# Patient Record
Sex: Male | Born: 1950 | Race: White | Hispanic: No | Smoking: Current every day smoker
Health system: Southern US, Community
[De-identification: ages and names within clinical notes are randomized; demographics above are authoritative.]

## PROBLEM LIST (undated history)

## (undated) DIAGNOSIS — F419 Anxiety disorder, unspecified: Secondary | ICD-10-CM

## (undated) DIAGNOSIS — I219 Acute myocardial infarction, unspecified: Secondary | ICD-10-CM

## (undated) DIAGNOSIS — I25119 Atherosclerotic heart disease of native coronary artery with unspecified angina pectoris: Secondary | ICD-10-CM

## (undated) DIAGNOSIS — R911 Solitary pulmonary nodule: Secondary | ICD-10-CM

## (undated) DIAGNOSIS — M1990 Unspecified osteoarthritis, unspecified site: Secondary | ICD-10-CM

## (undated) DIAGNOSIS — J449 Chronic obstructive pulmonary disease, unspecified: Secondary | ICD-10-CM

## (undated) DIAGNOSIS — I714 Abdominal aortic aneurysm, without rupture, unspecified: Secondary | ICD-10-CM

## (undated) DIAGNOSIS — Z9889 Other specified postprocedural states: Secondary | ICD-10-CM

## (undated) HISTORY — DX: Other specified postprocedural states: Z98.890

## (undated) HISTORY — PX: INGUINAL HERNIA REPAIR: SUR1180

## (undated) HISTORY — DX: Abdominal aortic aneurysm, without rupture, unspecified: I71.40

## (undated) HISTORY — DX: Abdominal aortic aneurysm, without rupture: I71.4

---

## 2005-07-23 ENCOUNTER — Ambulatory Visit (HOSPITAL_COMMUNITY): Admission: RE | Admit: 2005-07-23 | Discharge: 2005-07-23 | Payer: Self-pay | Admitting: Family Medicine

## 2005-09-16 ENCOUNTER — Ambulatory Visit (HOSPITAL_COMMUNITY): Admission: RE | Admit: 2005-09-16 | Discharge: 2005-09-16 | Payer: Self-pay | Admitting: General Surgery

## 2007-05-19 ENCOUNTER — Ambulatory Visit (HOSPITAL_COMMUNITY): Admission: RE | Admit: 2007-05-19 | Discharge: 2007-05-19 | Payer: Self-pay | Admitting: Family Medicine

## 2007-07-21 ENCOUNTER — Ambulatory Visit (HOSPITAL_COMMUNITY): Admission: RE | Admit: 2007-07-21 | Discharge: 2007-07-21 | Payer: Self-pay | Admitting: Family Medicine

## 2007-11-23 ENCOUNTER — Ambulatory Visit (HOSPITAL_COMMUNITY): Admission: RE | Admit: 2007-11-23 | Discharge: 2007-11-23 | Payer: Self-pay | Admitting: Family Medicine

## 2007-11-23 IMAGING — CT CT CHEST W/ CM
1 series · 15 of 31 positions shown, 19 images · IV contrast (Omnipaque 300)
Comparison: none

HISTORY: Followup lung nodule

[Series 2: chestroutine 5.0 b40f · axial · 0.72mm/px · z∈[-373,-83]mm · 15 of 64 slices shown, 19 images]
[im 3/64  mediastinal]
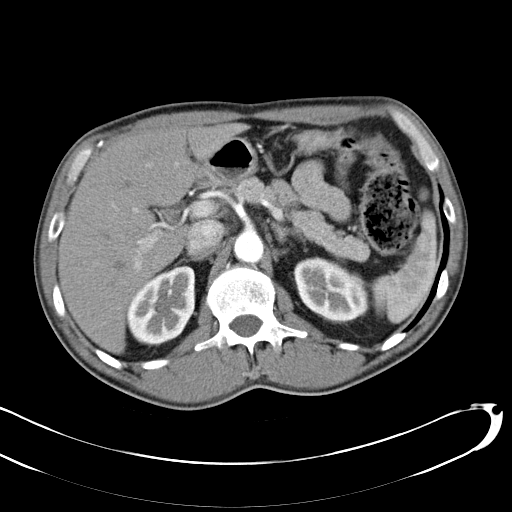
[im 3/64  lung]
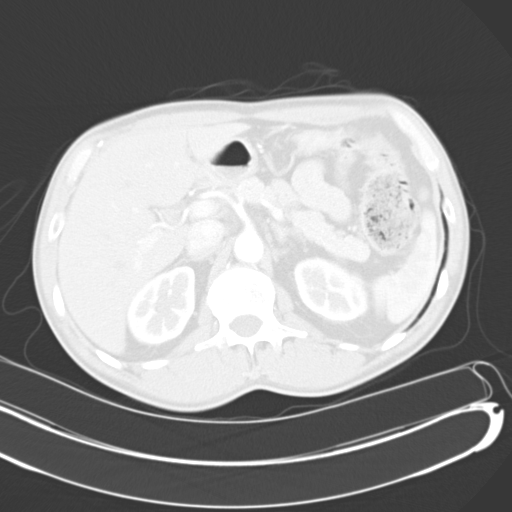
[im 8/64  lung]
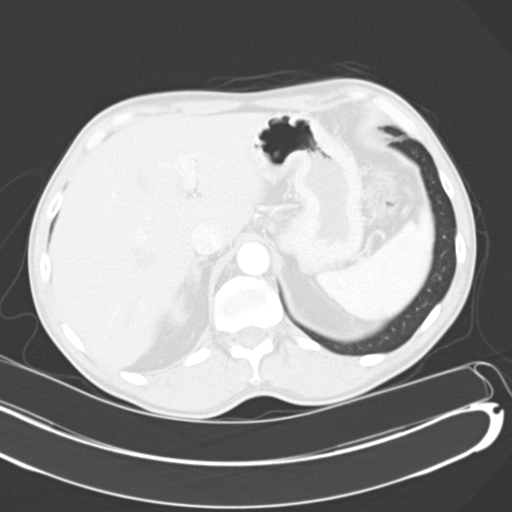
[im 12/64  lung]
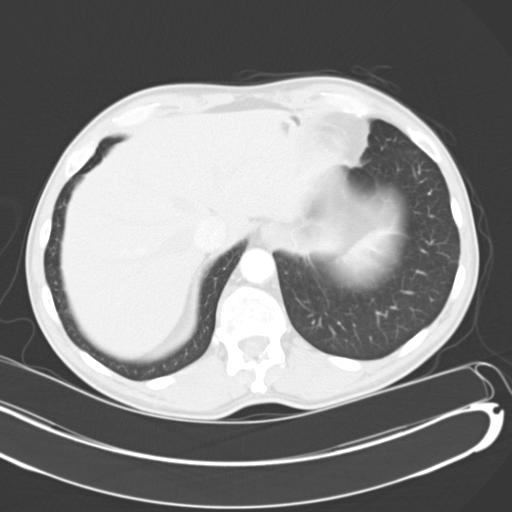
[im 15/64  lung]
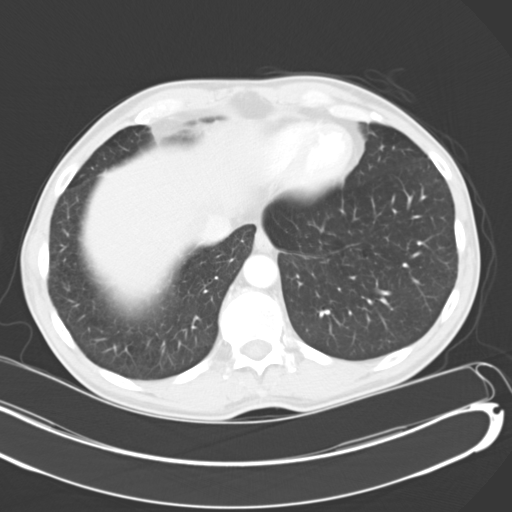
[im 19/64  mediastinal]
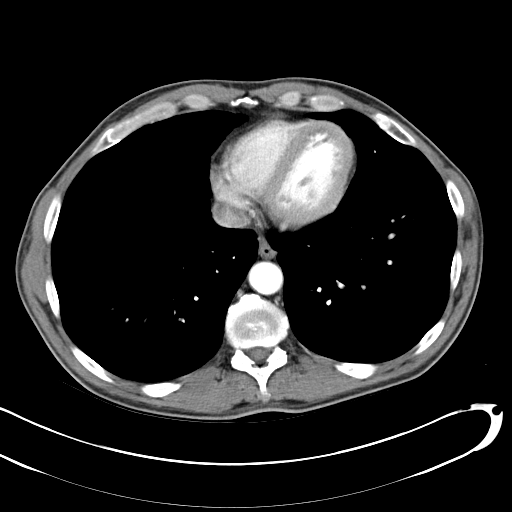
[im 19/64  lung]
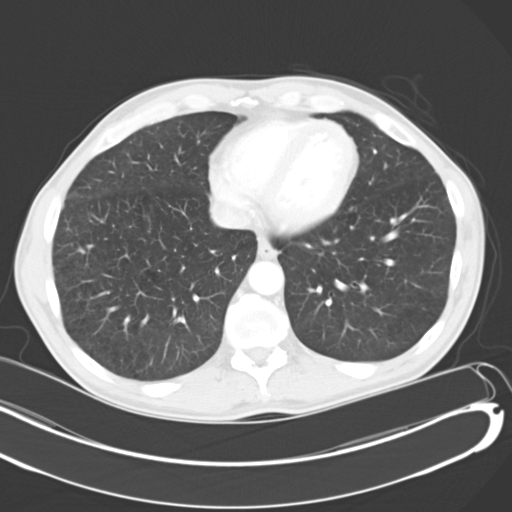
[im 24/64  lung]
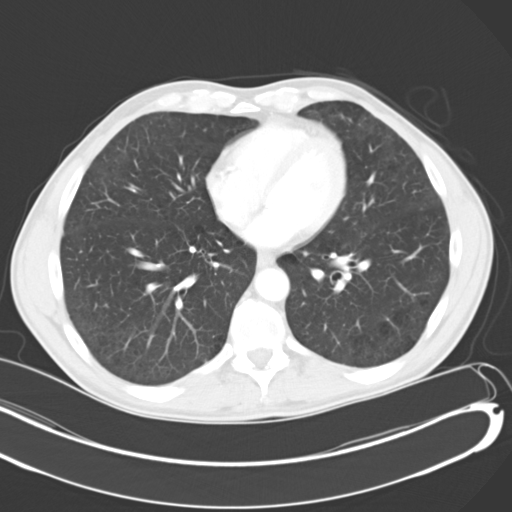
[im 29/64  lung]
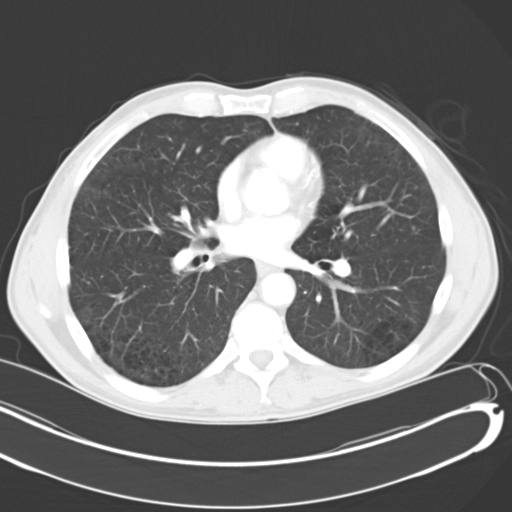
[im 33/64  lung]
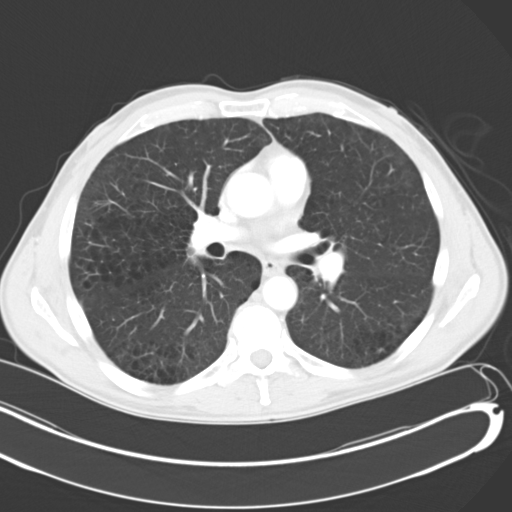
[im 36/64  mediastinal]
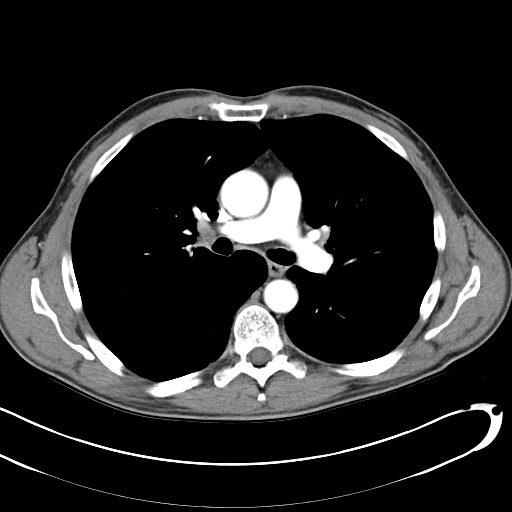
[im 36/64  lung]
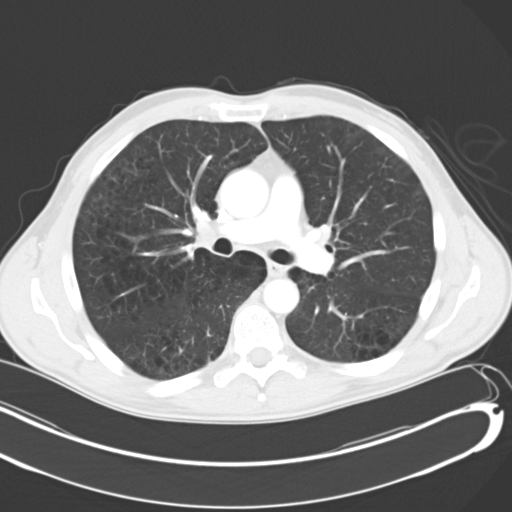
[im 40/64  lung]
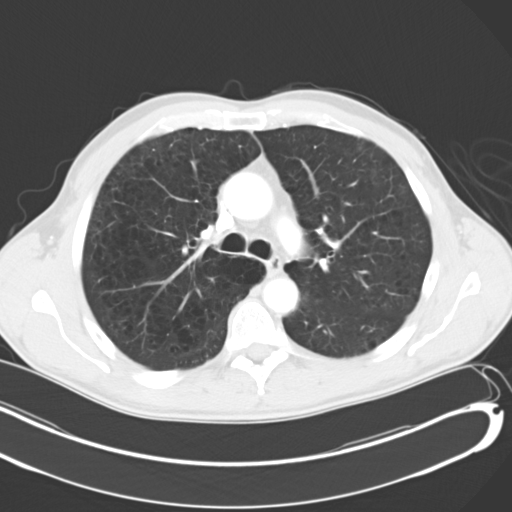
[im 45/64  lung]
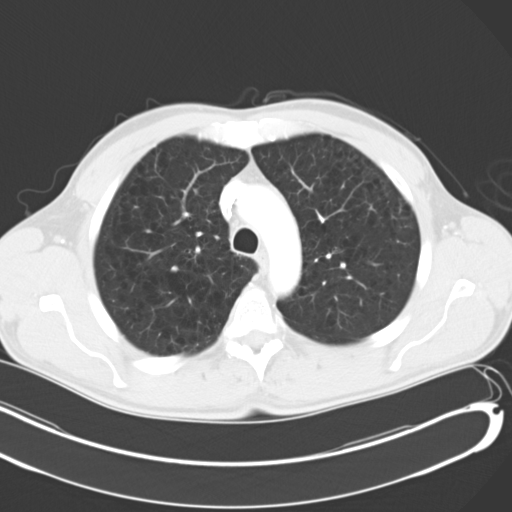
[im 50/64  lung]
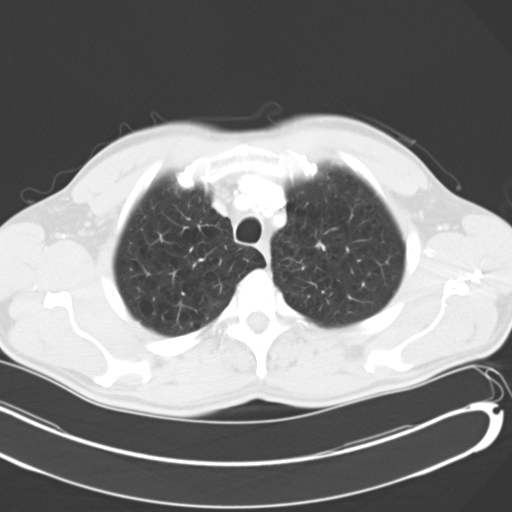
[im 52/64  mediastinal]
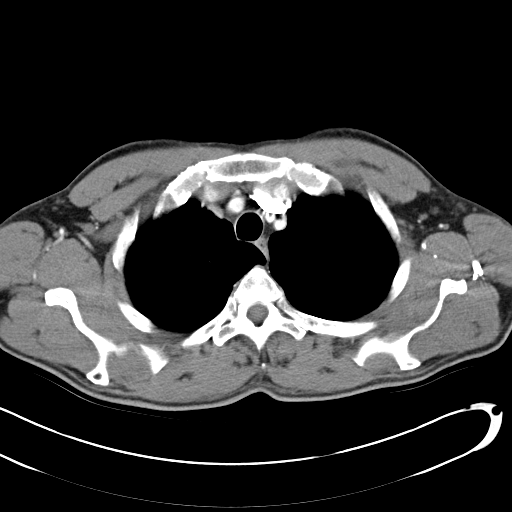
[im 52/64  lung]
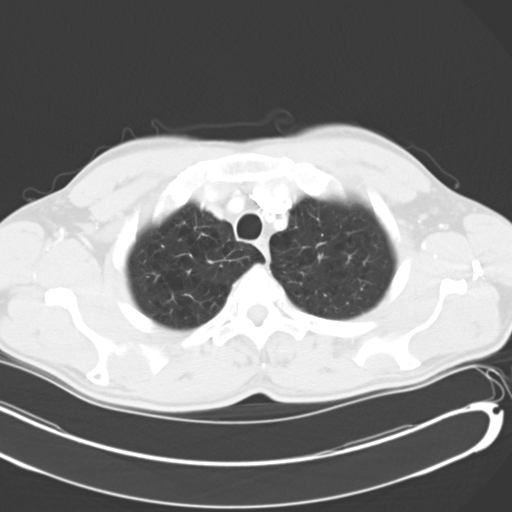
[im 57/64  lung]
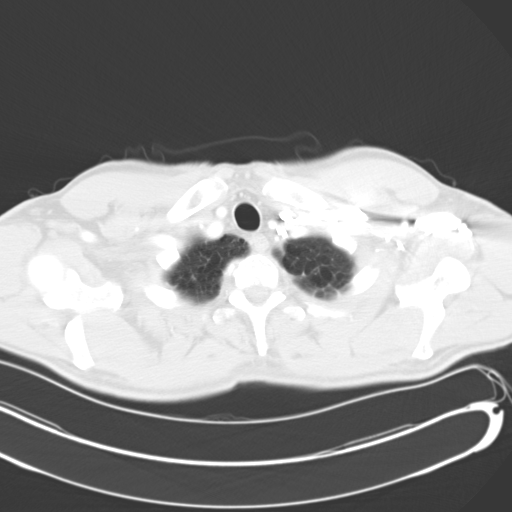
[im 61/64  lung]
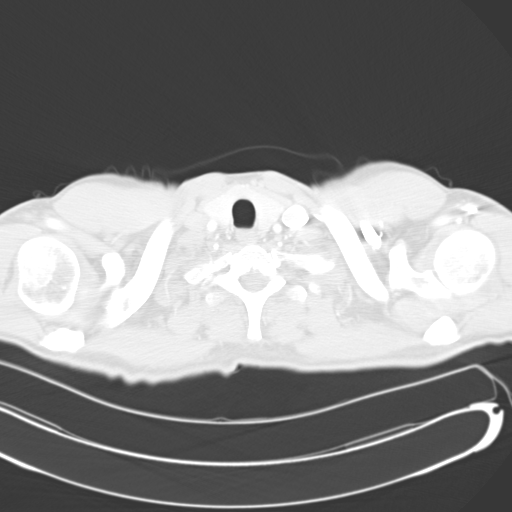

[15 of 31 positions shown; findings below may reference images not displayed]

CT CHEST WITH CONTRAST:

Multidetector helical CT chest performed following 80 cc [6R].
Sagittal and coronal images reconstructed from axial data set.
The prior outside study was not provided for comparison.

Thoracic vascular structures patent.
No thoracic adenopathy.
Visualized portion of upper abdomen normal.
Emphysematous changes with densely calcified granulomata in right middle lobe.
Nodular area of thickening identified at superior aspect the right major
fissure, 12 x 6 mm image 27.
Tiny scar in right middle lobe image 33.
Two tiny areas of questionable nodularity or scarring noted left lung images 32
and 35.
No pulmonary infiltrate, pleural effusion, or dominant pulmonary mass.
No acute bone abnormalities.
IMPRESSION: Calcified granulomata right middle lobe.
Additional nonspecific nodular foci superior aspect of right major fissure and
in bilateral lungs.
If patient's previous outside chest CT can be obtained, direct comparison can be
main 2 assess stability.
In the absence of direct comparison to previous exam to confirm stability,
recommend followup CT chest in 6 months to reassess.

## 2008-09-26 ENCOUNTER — Ambulatory Visit (HOSPITAL_COMMUNITY): Admission: RE | Admit: 2008-09-26 | Discharge: 2008-09-26 | Payer: Self-pay | Admitting: General Surgery

## 2008-11-11 ENCOUNTER — Ambulatory Visit (HOSPITAL_COMMUNITY): Admission: RE | Admit: 2008-11-11 | Discharge: 2008-11-11 | Payer: Self-pay | Admitting: Family Medicine

## 2008-11-11 IMAGING — CT CT CHEST W/O CM
1 series · 16 of 33 positions shown, 20 images · non-contrast
Comparison: [DATE]

CLINICAL DATA: Follow-up nodules

CT CHEST WITHOUT CONTRAST
TECHNIQUE: Multidetector CT imaging of the chest was performed
following the standard protocol without IV contrast.

[Series 2: chestroutine 5.0 b40f · axial · 0.68mm/px · z∈[-350,-65]mm · 16 of 63 slices shown, 20 images]
[im 3/63  mediastinal]
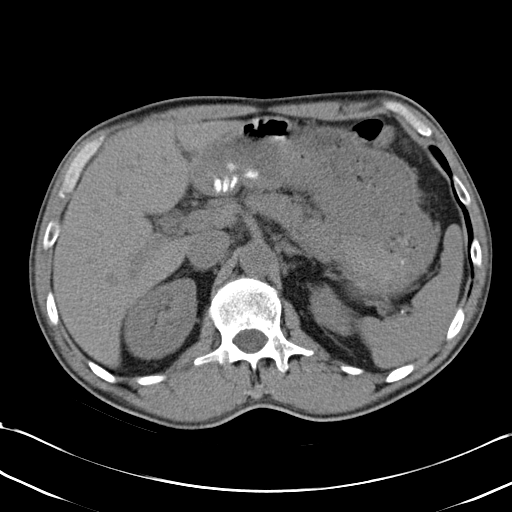
[im 3/63  lung]
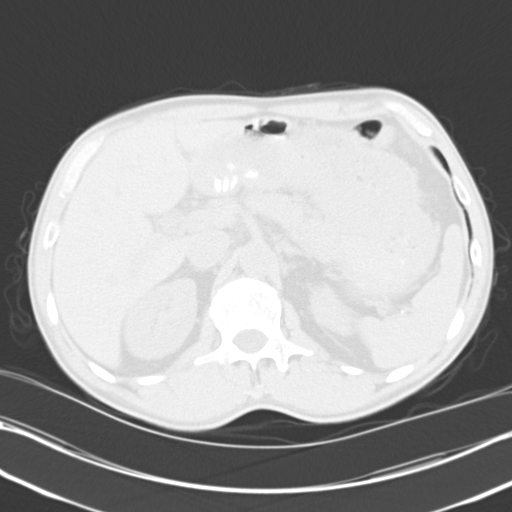
[im 7/63  lung]
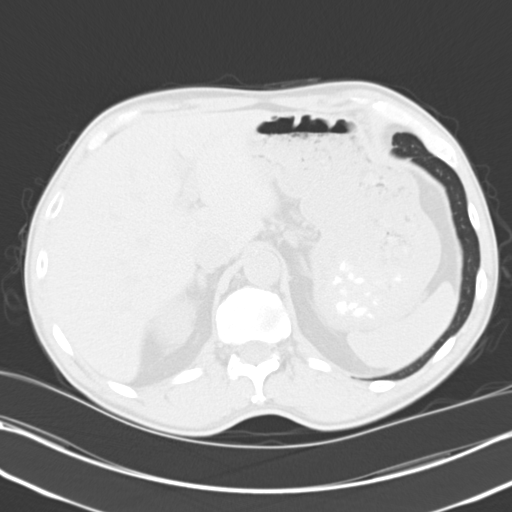
[im 12/63  lung]
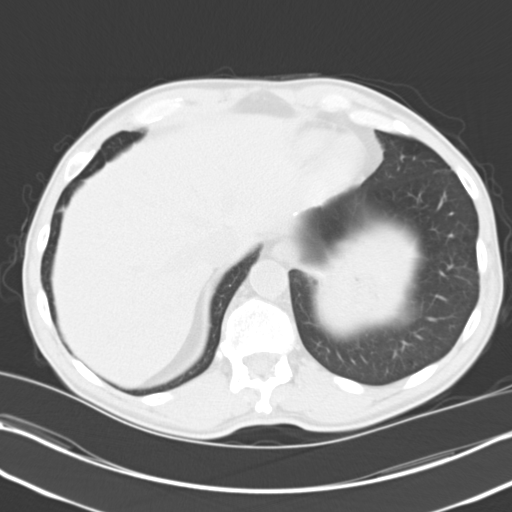
[im 14/63  lung]
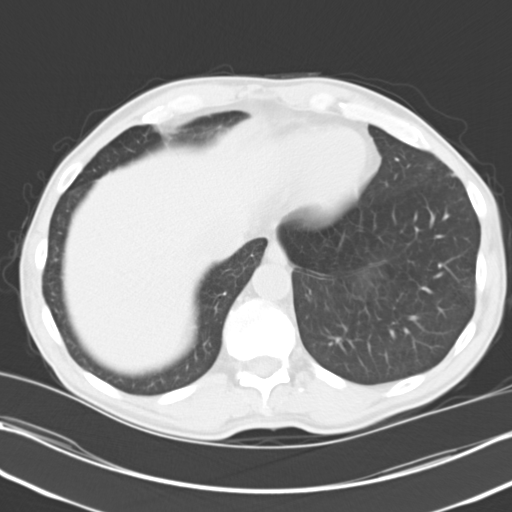
[im 19/63  mediastinal]
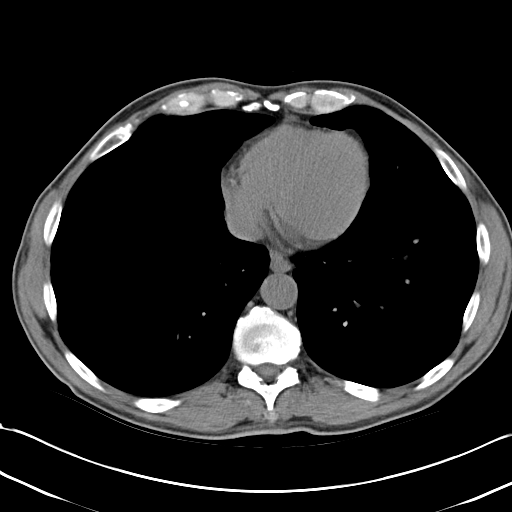
[im 19/63  lung]
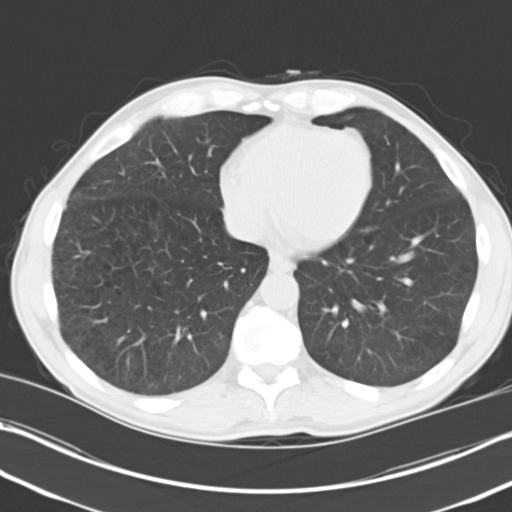
[im 23/63  lung]
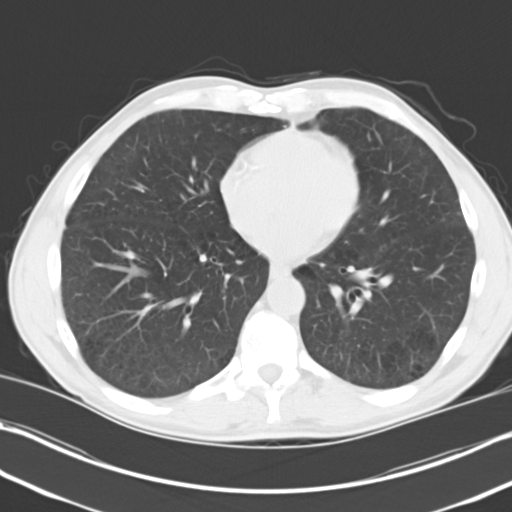
[im 26/63  lung]
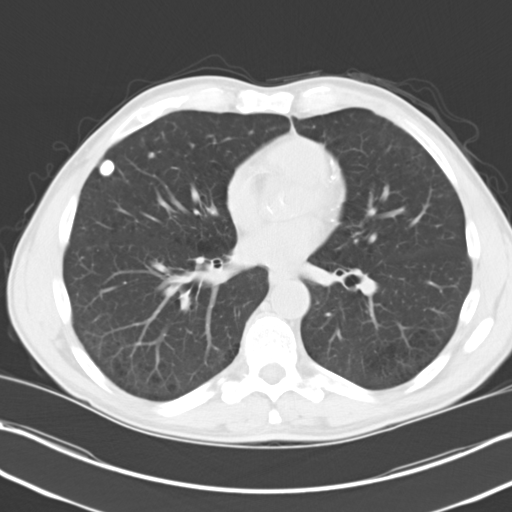
[im 30/63  lung]
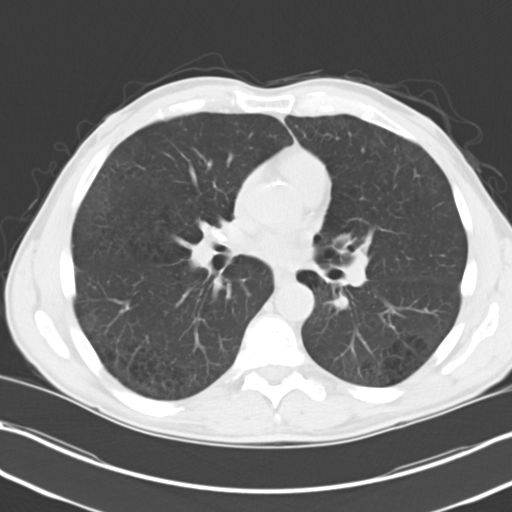
[im 34/63  mediastinal]
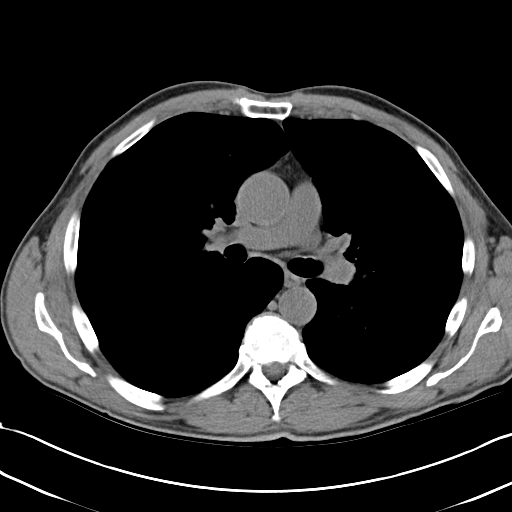
[im 34/63  lung]
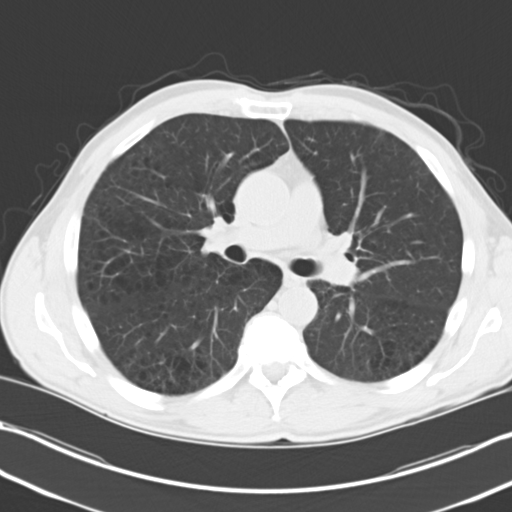
[im 37/63  lung]
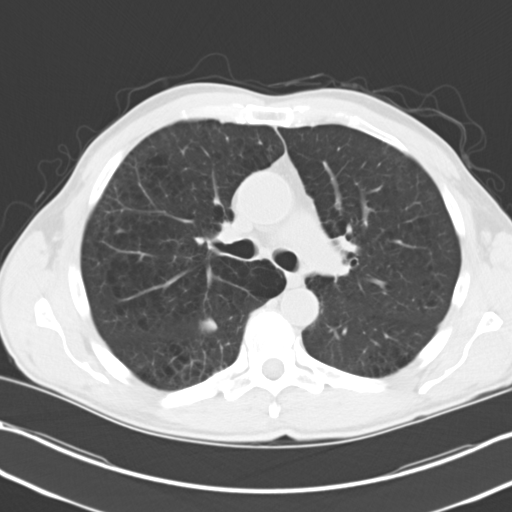
[im 40/63  lung]
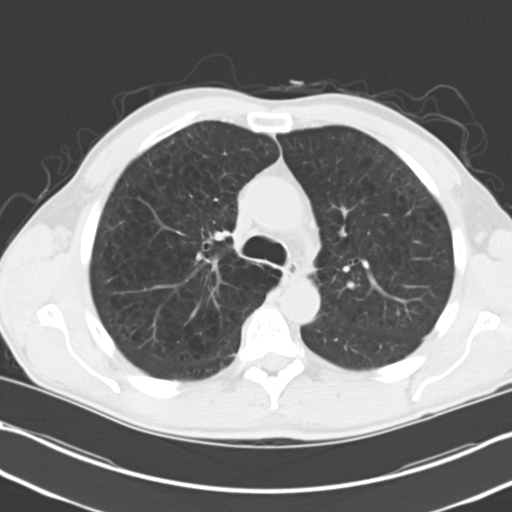
[im 44/63  lung]
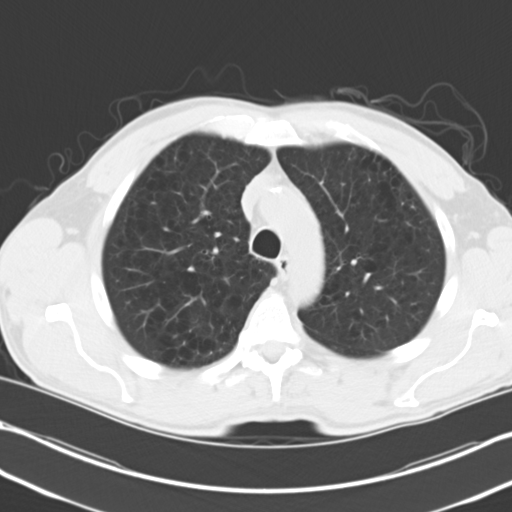
[im 49/63  mediastinal]
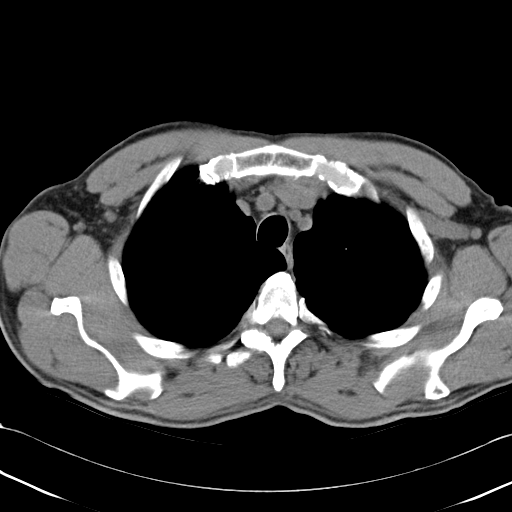
[im 49/63  lung]
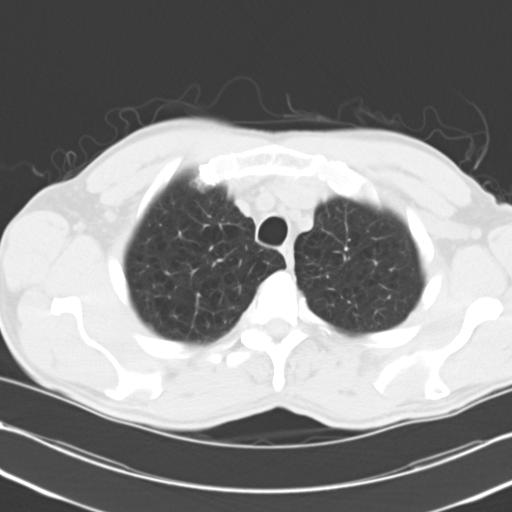
[im 51/63  lung]
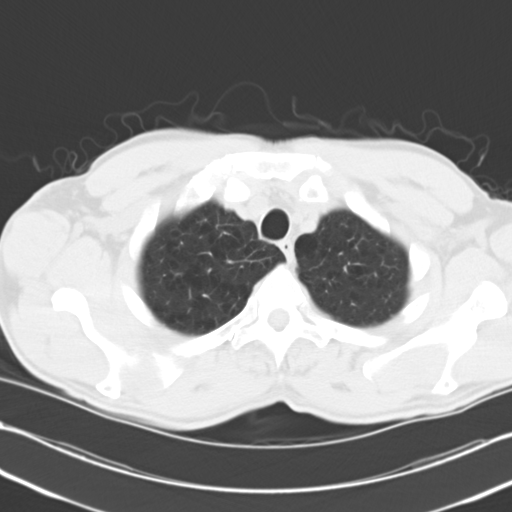
[im 56/63  lung]
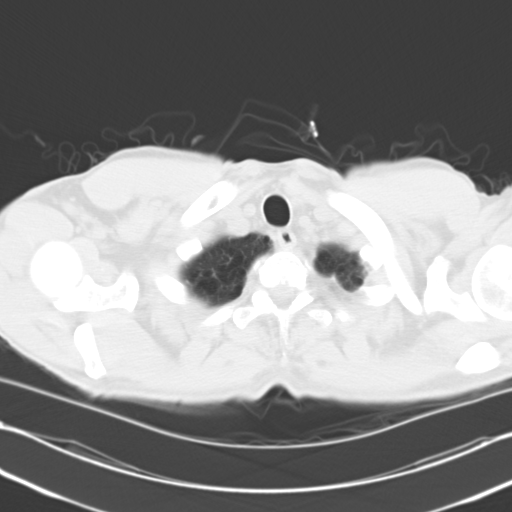
[im 60/63  lung]
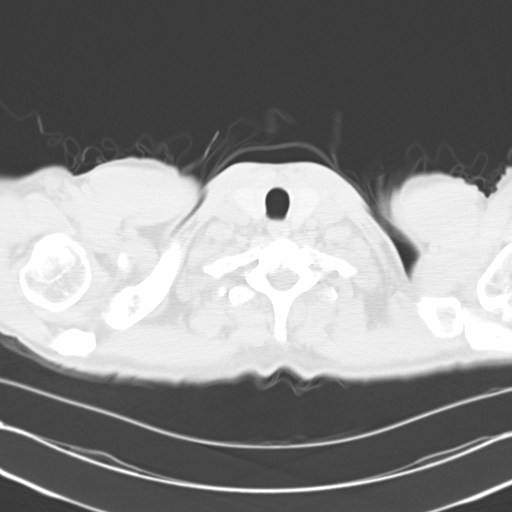

[16 of 33 positions shown; findings below may reference images not displayed]

FINDINGS: Negative abnormal mediastinal adenopathy.  Negative
pericardial effusion.  Coronary artery calcifications.

No pneumothorax or pleural effusion.

Pleural thickening in the right major fissure on image 27 is
stable.  Scarring in the superior segment of the left lower lobe on
image 32 is stable.  Scarring in the left upper lobe on images 36
and 37 is stable.  Right upper lobe scarring on image 32 is stable
calcified granuloma and adjacent nodule in the right upper lobe are
stable on image 38.  No new pulmonary nodules.

Severe emphysema is stable.  Thoracic spine is stable.  Upper
abdomen is stable.
IMPRESSION: Stable pulmonary parenchymal densities.  Stability over 2 years
supports benign etiology.

## 2010-11-02 ENCOUNTER — Ambulatory Visit (HOSPITAL_COMMUNITY)
Admission: RE | Admit: 2010-11-02 | Discharge: 2010-11-02 | Payer: Self-pay | Source: Home / Self Care | Attending: Family Medicine | Admitting: Family Medicine

## 2010-11-02 IMAGING — CR DG CHEST 2V
2 series · 2 of 2 positions shown · non-contrast
Comparison: CT chest of [DATE].

CLINICAL DATA: COPD.

CHEST - 2 VIEW

[view not recorded (1 of 2)]
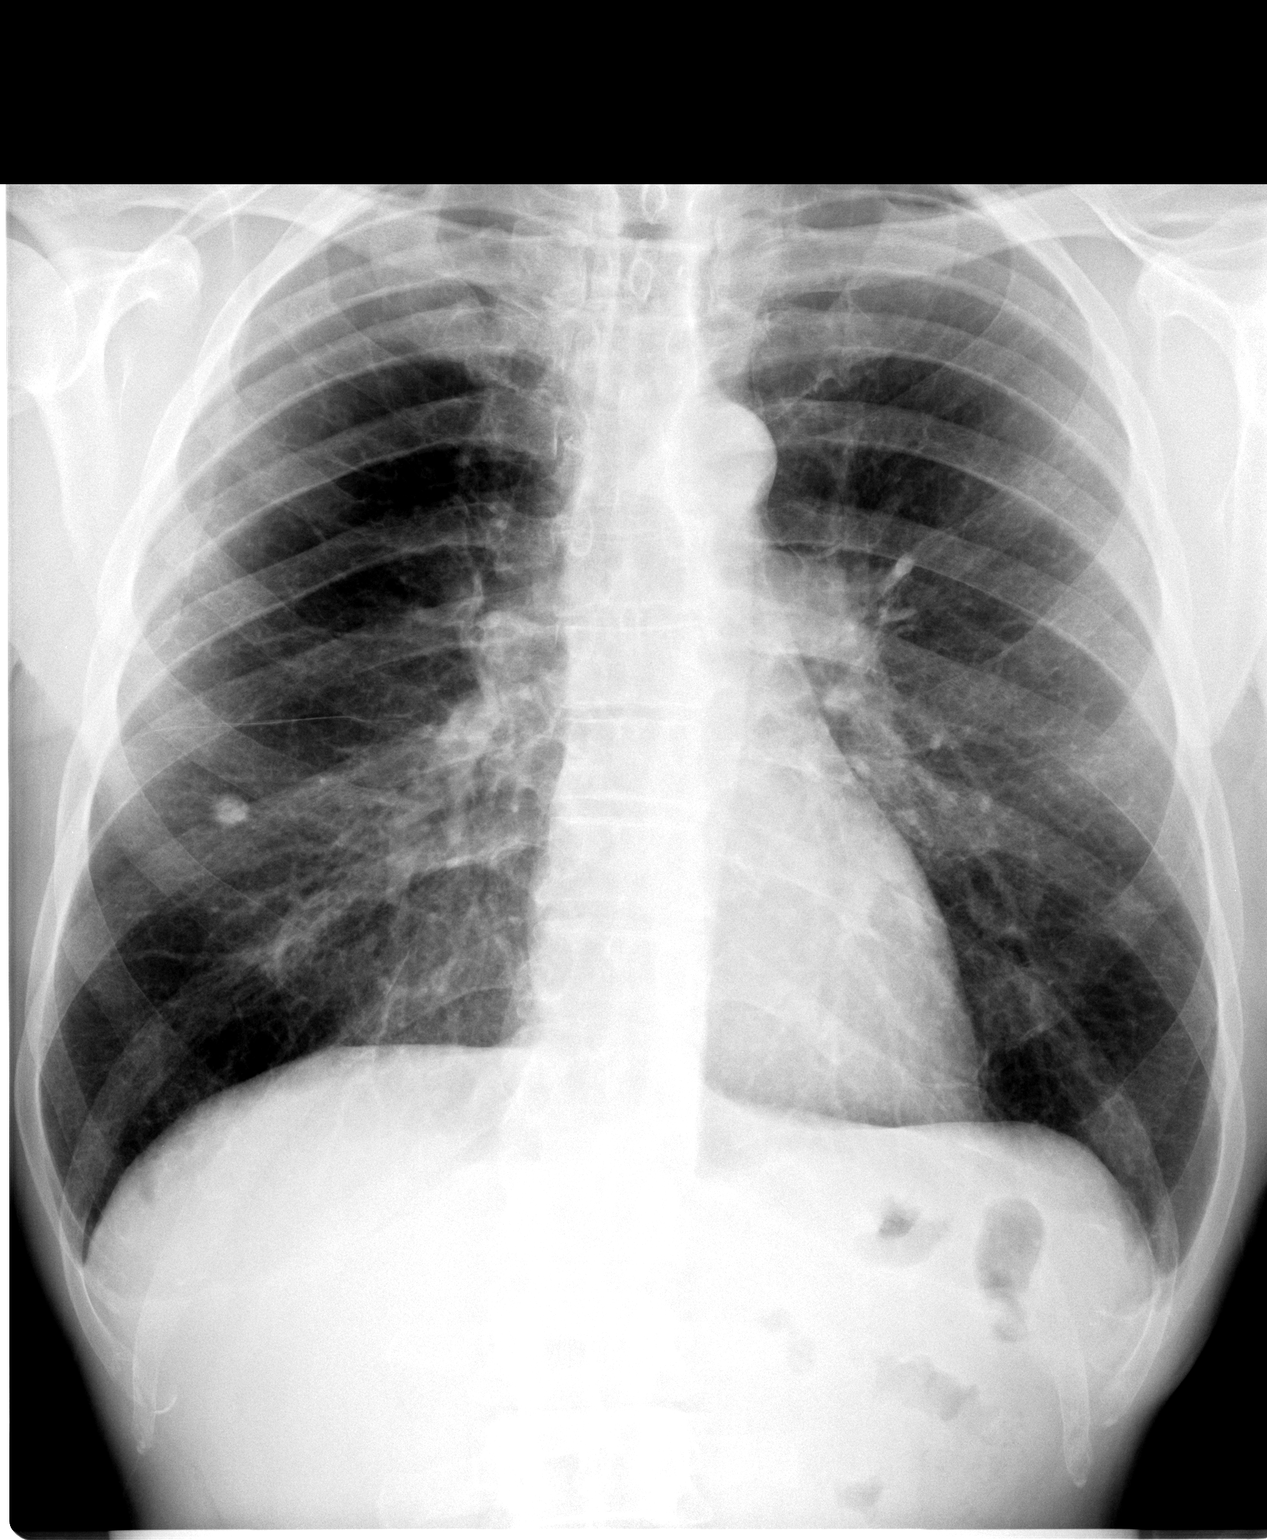

[view not recorded (2 of 2)]
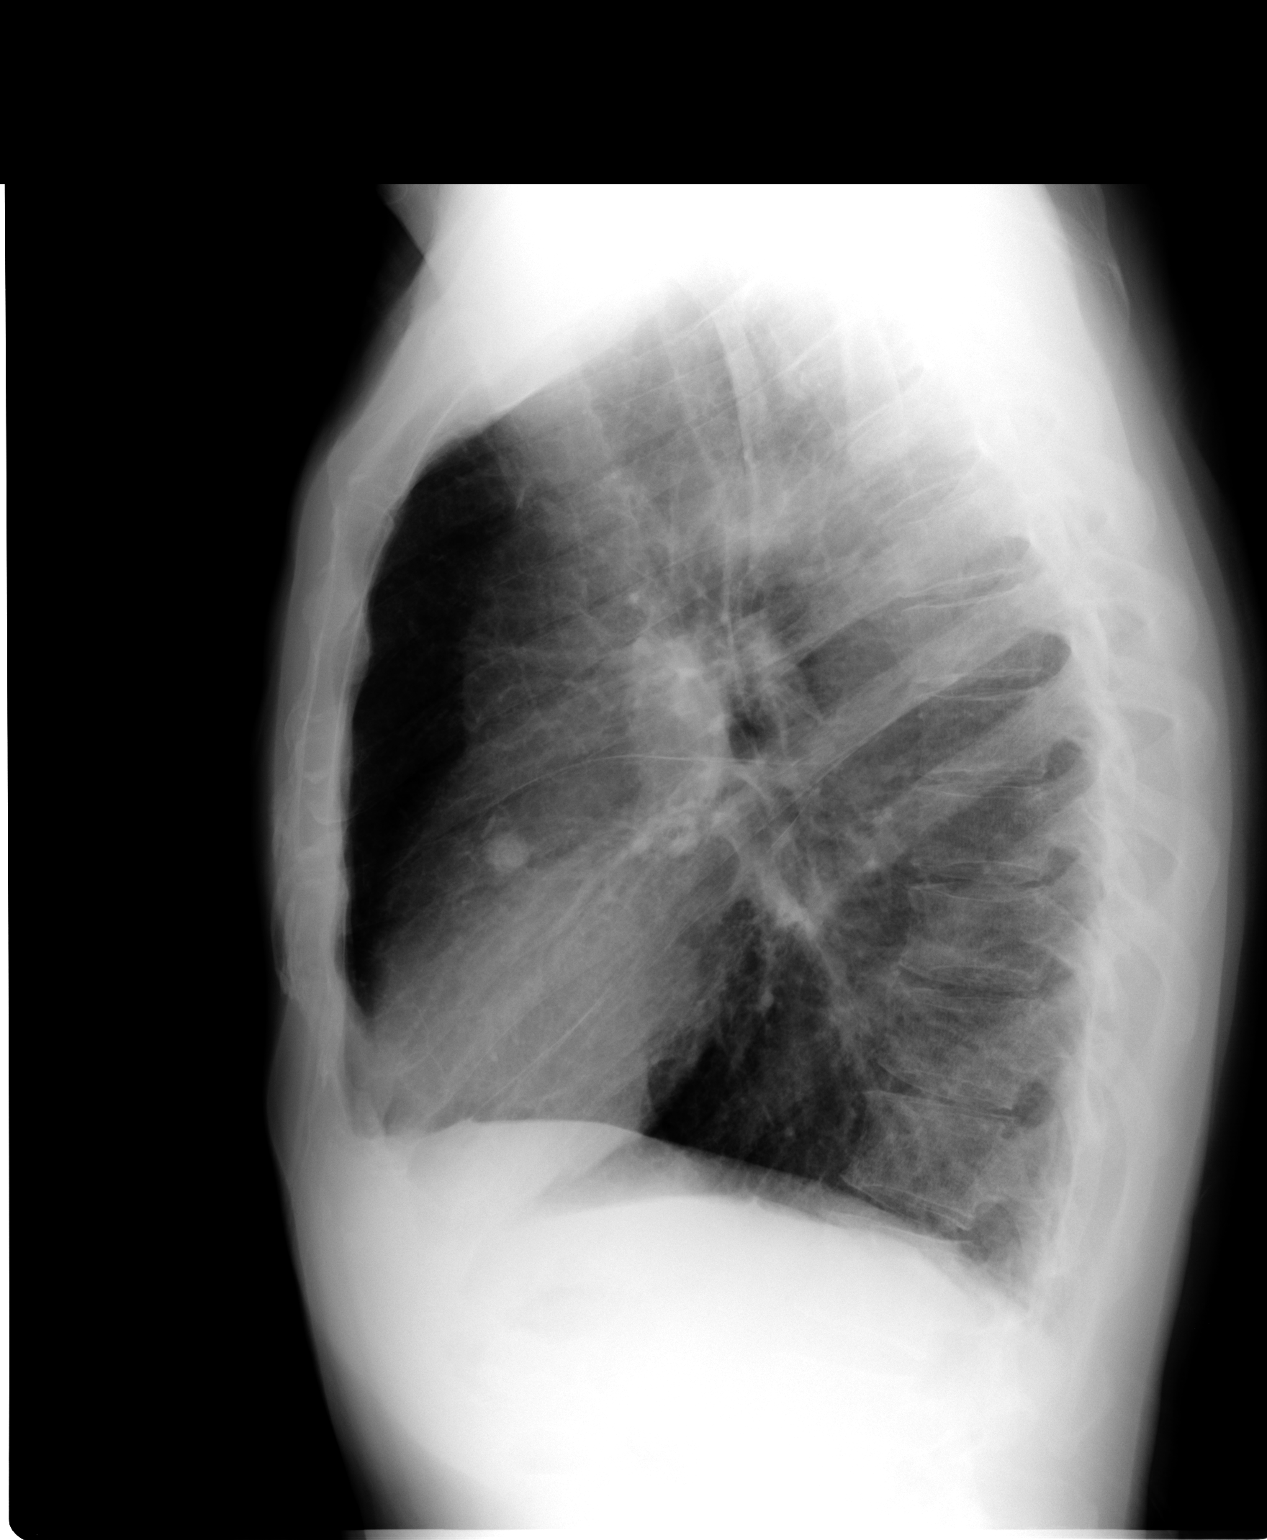

[2 of 2 positions shown; findings below may reference images not displayed]

FINDINGS: Lungs are emphysematous.  Calcified granuloma in the
right middle lobe is unchanged.  No new airspace disease or
effusion.  No pneumothorax.  Heart size normal.
IMPRESSION: COPD without acute disease.

## 2010-11-11 ENCOUNTER — Encounter: Payer: Self-pay | Admitting: Family Medicine

## 2010-12-31 ENCOUNTER — Encounter (HOSPITAL_COMMUNITY): Payer: Self-pay

## 2011-01-08 ENCOUNTER — Ambulatory Visit (HOSPITAL_COMMUNITY)
Admission: RE | Admit: 2011-01-08 | Discharge: 2011-01-08 | Disposition: A | Discharge status: Home / Self Care | Payer: Managed Care, Other (non HMO) | Source: Ambulatory Visit | Attending: Pulmonary Disease | Admitting: Pulmonary Disease

## 2011-01-08 DIAGNOSIS — R0602 Shortness of breath: Secondary | ICD-10-CM | POA: Insufficient documentation

## 2011-01-08 LAB — BLOOD GAS, ARTERIAL
Bicarbonate: 23.4 mEq/L (ref 20.0–24.0)
O2 Saturation: 97.7 %
Patient temperature: 37

## 2011-03-05 NOTE — H&P (Signed)
NAME:  PIERCEN, COVINO               ACCOUNT NO.:  [id number]   MEDICAL RECORD NO.:  [id number]          PATIENT TYPE:  AMB   LOCATION:  DAY                           FACILITY:  [facility]   PHYSICIAN:  Tilford Pillar, MD      DATE OF BIRTH:  03-Aug-1951   DATE OF ADMISSION:  DATE OF DISCHARGE:  LH                              HISTORY & PHYSICAL   CHIEF COMPLAINT:  Hernia.   HISTORY OF PRESENT ILLNESS:  The patient is a 60 year old male who  presented to my office on referral after about one plus year history of  notable bulge in the right groin.  This slowly increased in size.  He  has had intermittent association with burning, discomfort, and pain in  this area.  No nausea, vomiting, no signs or symptoms consistent with  incarceration or strangulation.  He has had no change with bowel  movement, no hematochezia.  No melena.  No change with urination.  No  erectile dysfunction.   PAST MEDICAL HISTORY:  1. Anxiety.  2. Insomnia.  3. Osteoarthritis.   PAST SURGICAL HISTORY:  Previous left inguinal hernia repair with mesh  approximately 15 years ago.   MEDICATIONS:  1. Mobic  2. Xanax.   ALLERGIES:  No known drug allergies.   SOCIAL HISTORY:  One-pack per day smoker.  No alcohol use.  Occupation  is a Naval architect.  There is significantly involved lifting associated  with this.   REVIEW OF SYSTEMS:  CONSTITUTIONAL:  Unremarkable.  EYES:  Blurred  vision.  EARS, NOSE, and THROAT:  Unremarkable.  RESPIRATORY:  Unremarkable.  CARDIOVASCULAR:  Unremarkable.  GASTROINTESTINAL:  Unremarkable.  GENITOURINARY:  Unremarkable.  MUSCULOSKELETAL:  Unremarkable.  SKIN:  Unremarkable.  ENDOCRINE:  Unremarkable.  NEURO:  Unremarkable.   PHYSICAL EXAMINATION:  GENERAL:  The patient is a healthy appearing  male, well developed.  He is calm in no acute distress.  He is alert and  oriented x3.  HEENT:  Scalp no deformities, no masses.  Eyes, pupils equal, round, and  reactive.  Extraocular  movements are intact.  No conjunctival pallor is  noted.  Oral mucosa is pink.  Normal occlusion.  NECK:  Trachea is midline.  No cervical lymphadenopathy.  PULMONARY:  Unlabored respiration.  No wheezes.  No crackles.  He has  good auscultation bilaterally.  CARDIOVASCULAR:  Regular rate and rhythm.  No murmurs, no gallops.  He  has 2+ radial pulses bilaterally.  ABDOMINAL:  Positive bowel sounds.  Abdomen is soft, nontender.  He does  have a right inguinal hernia that is reducible.  No masses are apparent.  No left inguinal hernias noted.  GENITALIA:  Normal male external genitalia.  He has bilateral descended  testicles.  SKIN:  Warm and dry.   ASSESSMENT AND PLAN:  Right inguinal hernia.  At this time, I did  discuss the risks, benefits, and alternatives of a right inguinal  herniorrhaphy with mesh.  The risk including, but not limited to risk of  bleeding, infection, mesh infection requiring the removal of the mesh  and subsequent inguinal hernia repair  upon resolution of the infection,  as well as possibility of ischemic orchitis, paresthesia, testicular  loss, as well as the possibility of intraoperative cardiac and pulmonary  events.  The patient's questions and concerns are addressed.  I did  discuss the patient's typical recovery with the limitation of no heavy  lifting over 20 pounds for 4-6 weeks.  He understands this and does wish  to proceed.  Signs and symptoms of incarceration and strangulation were  also discussed with the patient and the patient will present to the  emergency room should any of these symptoms develop.      Tilford Pillar, MD  Electronically Signed     BZ/MEDQ  D:  09/20/2008  T:  09/21/2008  Job:  [id number]   cc:   Patrica Duel, M.D.  Fax: [phone]   [facility]

## 2011-03-05 NOTE — Op Note (Signed)
NAME:  [name], [name]               ACCOUNT NO.:  [id number]   MEDICAL RECORD NO.:  [id number]          PATIENT TYPE:  AMB   LOCATION:  DAY                           FACILITY:  [facility]   PHYSICIAN:  Tilford Pillar, MD      DATE OF BIRTH:  [birth year]   DATE OF PROCEDURE:  09/26/2008  DATE OF DISCHARGE:                               OPERATIVE REPORT   PREOPERATIVE DIAGNOSIS:  Right inguinal hernia.   POSTOPERATIVE DIAGNOSIS:  Right inguinal hernia.   PROCEDURE:  Right inguinal herniorrhaphy with mesh.   SURGEON:  Tilford Pillar, MD   ANESTHESIA:  General laryngeal mask airway, local anesthetic 0.5%  Sensorcaine plain.   SPECIMENS:  None.   ESTIMATED BLOOD LOSS:  Minimal.   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  This  has slowly increased in size.  He does have occasional tenderness in  this area.  On evaluation, he did have a right inguinal hernia.  Risks,  benefits, and alternatives of a herniorrhaphy were discussed at length  with the patient including, but not limited to risk of bleeding,  infection, infection of the mesh requiring removal and subsequent repair  after interval treatments of the infection, ischemic orchitis,  testicular loss as well as possibility of intraoperative cardiac and  pulmonary events.  The patient's questions and concerns were addressed  and the patient was consented for planned procedure.   OPERATION:  The patient was taken to the operating room and was placed  in the supine position on the operating room table, at which time  general anesthetic was administered.  Once the patient was asleep, he  had a laryngeal mask airway placed by Anesthesia.  At this point, his  right groin was prepped and draped in the standard fashion using  DuraPrep and sterile drapes.  At this point, a skin incision was made in  the right groin with a scalpel.  Additional  dissection down through  subcuticular tissue including division of Scarpa fascia was carried out  using electrocautery.  This was carried out down to the external oblique  fascia, which was scored with a scalpel and opened medially with a  Metzenbaum scissors to the external inguinal ring.  At this point, upon  entering into the inguinal canal, the cord structures were isolated and  dissected free from the surrounding structures.  A window was created  around the cord structures of the pubic tubercle.  A Penrose drain was  utilized to help with the retraction.  Immobilization of the cord  structures with the cord structures isolated.  A large direct hernia was  identified.  A Ray-Tec sponge was temporarily placed into this to help  reduce it back into the abdominal cavity.  On inspection of the cord  structures, there was no evidence of any indirect component, therefore  attention was turned to repair of the direct hernia.   The douche was removed.  A large Prolene mesh plug was then inserted  into the defect,  which was reduced because a series of 2-0 Novofil  sutures were utilized to tack the mesh to the surrounding edges of the  defect.  This did help continue to reduce the hernia sac.  At this time,  the mesh onlay was then placed tacking it medially over the pubic  tubercle superiorly to the conjoined tendon and inferiorly to the  shelving portion of the inguinal ligament.  Laterally, the keyhole  defect was secured around the cord structures and the tails were tucked  underneath the external oblique fascia.  Tacking sutures utilized 2-0  Novofil sutures throughout.  With the mesh in place, I was quite pleased  with the appearance of the repair.  At this time, the wound was  irrigated.  A 2-0 Vicryl suture was utilized to reapproximate the  external oblique fascia in a running continuous fashion.  The local  anesthetic was instilled.  A 3-0 Vicryl suture was utilized to   reapproximate the Scarpa fascia and then a 4-0 Monocryl was utilized in  a running subcuticular suture to reapproximate the skin edges in a  running subcuticular suture.  Skin was washed and dried with moist dry  towel.  Benzoin was applied around the incision.  Half-inch Steri-Strips  were placed.  The patient was allowed to come out of the general  anesthetic and was transferred back to regular hospital bed.  He was  transferred to Post-Anesthetic Care Unit in stable condition.  At the  conclusion of procedure, all instrument, sponge, and needle counts were  correct.  The patient tolerated the procedure well.      Tilford Pillar, MD  Electronically Signed     BZ/MEDQ  D:  09/26/2008  T:  09/27/2008  Job:  [id number]   cc:   Patrica Duel, M.D.  Fax: [phone]

## 2011-03-08 NOTE — Procedures (Signed)
NAME:  KARLON, SCHLAFER               ACCOUNT NO.:  [id number]   MEDICAL RECORD NO.:  [id number]          PATIENT TYPE:  OUT   LOCATION:  RESP                          FACILITY:  [facility]   PHYSICIAN:  Edward L. Juanetta Gosling, M.D.DATE OF BIRTH:  07-Sep-1951   DATE OF PROCEDURE:  DATE OF DISCHARGE:  07/21/2007                            PULMONARY FUNCTION TEST   IMPRESSION:  1. Spirometry shows very high forced vital capacity and FEV-1, but is      showing some airflow obstruction as well.  2. Lung volumes show some hyperexpansion which is consistent with the      spirometry.  3. Diffusion capacity of carbon monoxide is mildly reduced.  4. Arterial blood gases are normal.  5. There is no significant bronchodilator effect.      Edward L. Juanetta Gosling, M.D.  Electronically Signed     ELH/MEDQ  D:  07/22/2007  T:  07/23/2007  Job:  [id number]   cc:   Kirk Ruths, M.D.  Fax: [phone]

## 2011-03-08 NOTE — H&P (Signed)
NAME:  [name], [name]               ACCOUNT NO.:  [id number]   MEDICAL RECORD NO.:  [id number]          PATIENT TYPE:  AMB   LOCATION:                                FACILITY:  [facility]   PHYSICIAN:  Dalia Heading, M.D.  DATE OF BIRTH:  Feb 27, 1951   DATE OF ADMISSION:  09/16/2005  DATE OF DISCHARGE:  LH                                HISTORY & PHYSICAL   CHIEF COMPLAINT:  Need for screening colonoscopy.   HISTORY OF PRESENT ILLNESS:  The patient is a 60 year old white male who is  referred for endoscopic evaluation.  He needs a colonoscopy for screening  purposes.  No abdominal pain, weight loss, nausea, vomiting, diarrhea,  constipation, melena, hematochezia had bee noted. Never had a colonoscopy.  There is no family history of colon carcinoma.   PAST MEDICAL HISTORY:  Unremarkable.   PAST SURGICAL HISTORY:  Unremarkable.   CURRENT MEDICATIONS:  Xanax p.r.n.   ALLERGIES:  No known drug allergies.   REVIEW OF SYSTEMS:  The patient smokes a pack of cigarettes a day. He denies  any alcohol use.   PHYSICAL EXAMINATION:  GENERAL:  The patient is a well-developed, well-  nourished white male in no acute distress.  LUNGS:  Clear to auscultation with equal breath sounds bilaterally.  HEART:  Regular rate and rhythm without S3, S4, or murmurs.  ABDOMEN:  Soft, nontender, nondistended.  No hepatosplenomegaly or masses  are noted.  RECTAL:  Deferred to the procedure.   IMPRESSION:  Need for screening colonoscopy.   PLAN:  The patient is scheduled for a colonoscopy on September 16, 2005.  The  risks and benefits of the procedure including bleeding and perforation were  fully explained to the patient who gave informed consent.      Dalia Heading, M.D.  Electronically Signed     MAJ/MEDQ  D:  08/29/2005  T:  08/29/2005  Job:  [id number]   cc:   [name]  Fax: [phone]   [name], M.D.  Fax: [phone]

## 2011-07-26 LAB — CBC
HCT: 41.7 % (ref 39.0–52.0)
Hemoglobin: 14.4 g/dL (ref 13.0–17.0)
MCHC: 34.5 g/dL (ref 30.0–36.0)
MCV: 104 fL — ABNORMAL HIGH (ref 78.0–100.0)
Platelets: 175 10*3/uL (ref 150–400)
RBC: 4.01 MIL/uL — ABNORMAL LOW (ref 4.22–5.81)
RDW: 14 % (ref 11.5–15.5)
WBC: 8 10*3/uL (ref 4.0–10.5)

## 2011-07-26 LAB — BASIC METABOLIC PANEL
BUN: 8 mg/dL (ref 6–23)
CO2: 28 mEq/L (ref 19–32)
Calcium: 9.3 mg/dL (ref 8.4–10.5)
Chloride: 105 mEq/L (ref 96–112)
Creatinine, Ser: 0.88 mg/dL (ref 0.4–1.5)
GFR calc Af Amer: >60 mL/min (ref >60)
GFR calc non Af Amer: >60 mL/min (ref >60)
Glucose, Bld: 77 mg/dL (ref 70–99)
Potassium: 4.3 mEq/L (ref 3.5–5.1)
Sodium: 139 mEq/L (ref 135–145)

## 2011-08-01 LAB — BLOOD GAS, ARTERIAL
Acid-base deficit: 3.6 — ABNORMAL HIGH
Bicarbonate: 20.6
FIO2: 0.21
O2 Saturation: 97
Patient temperature: 37
TCO2: 18
pCO2 arterial: 34.9 — ABNORMAL LOW
pH, Arterial: 7.388
pO2, Arterial: 92.8

## 2014-01-23 ENCOUNTER — Other Ambulatory Visit: Payer: Self-pay

## 2014-01-23 ENCOUNTER — Encounter (HOSPITAL_COMMUNITY): Payer: Self-pay | Admitting: Emergency Medicine

## 2014-01-23 ENCOUNTER — Emergency Department (HOSPITAL_COMMUNITY): Payer: Medicare Other

## 2014-01-23 ENCOUNTER — Emergency Department (HOSPITAL_COMMUNITY)
Admission: EM | Admit: 2014-01-23 | Discharge: 2014-01-23 | Disposition: A | Discharge status: Home / Self Care | Payer: Medicare Other | Attending: Emergency Medicine | Admitting: Emergency Medicine

## 2014-01-23 DIAGNOSIS — F17200 Nicotine dependence, unspecified, uncomplicated: Secondary | ICD-10-CM | POA: Insufficient documentation

## 2014-01-23 DIAGNOSIS — R109 Unspecified abdominal pain: Secondary | ICD-10-CM

## 2014-01-23 DIAGNOSIS — Z8739 Personal history of other diseases of the musculoskeletal system and connective tissue: Secondary | ICD-10-CM | POA: Diagnosis not present

## 2014-01-23 DIAGNOSIS — K5900 Constipation, unspecified: Secondary | ICD-10-CM | POA: Diagnosis not present

## 2014-01-23 DIAGNOSIS — Z8659 Personal history of other mental and behavioral disorders: Secondary | ICD-10-CM | POA: Insufficient documentation

## 2014-01-23 DIAGNOSIS — I723 Aneurysm of iliac artery: Secondary | ICD-10-CM | POA: Diagnosis not present

## 2014-01-23 DIAGNOSIS — J449 Chronic obstructive pulmonary disease, unspecified: Secondary | ICD-10-CM | POA: Diagnosis not present

## 2014-01-23 DIAGNOSIS — I714 Abdominal aortic aneurysm, without rupture, unspecified: Secondary | ICD-10-CM | POA: Diagnosis not present

## 2014-01-23 DIAGNOSIS — J4489 Other specified chronic obstructive pulmonary disease: Secondary | ICD-10-CM | POA: Insufficient documentation

## 2014-01-23 DIAGNOSIS — R1084 Generalized abdominal pain: Secondary | ICD-10-CM | POA: Diagnosis not present

## 2014-01-23 DIAGNOSIS — I10 Essential (primary) hypertension: Secondary | ICD-10-CM | POA: Diagnosis not present

## 2014-01-23 HISTORY — DX: Unspecified osteoarthritis, unspecified site: M19.90

## 2014-01-23 HISTORY — DX: Anxiety disorder, unspecified: F41.9

## 2014-01-23 HISTORY — DX: Solitary pulmonary nodule: R91.1

## 2014-01-23 HISTORY — DX: Chronic obstructive pulmonary disease, unspecified: J44.9

## 2014-01-23 LAB — CBC WITH DIFFERENTIAL/PLATELET
Basophils Absolute: 0 10*3/uL (ref 0.0–0.1)
Basophils Relative: 0 % (ref 0–1)
Eosinophils Absolute: 0.1 10*3/uL (ref 0.0–0.7)
Eosinophils Relative: 1 % (ref 0–5)
HCT: 45 % (ref 39.0–52.0)
Hemoglobin: 15.2 g/dL (ref 13.0–17.0)
Lymphocytes Relative: 22 % (ref 12–46)
Lymphs Abs: 2.2 10*3/uL (ref 0.7–4.0)
MCH: 33.1 pg (ref 26.0–34.0)
MCHC: 33.8 g/dL (ref 30.0–36.0)
MCV: 98 fL (ref 78.0–100.0)
Monocytes Absolute: 0.9 10*3/uL (ref 0.1–1.0)
Monocytes Relative: 9 % (ref 3–12)
Neutro Abs: 6.8 10*3/uL (ref 1.7–7.7)
Neutrophils Relative %: 68 % (ref 43–77)
Platelets: 185 10*3/uL (ref 150–400)
RBC: 4.59 MIL/uL (ref 4.22–5.81)
RDW: 13.2 % (ref 11.5–15.5)
WBC: 10 10*3/uL (ref 4.0–10.5)

## 2014-01-23 LAB — COMPREHENSIVE METABOLIC PANEL
ALT: 8 U/L (ref 0–53)
AST: 20 U/L (ref 0–37)
Albumin: 4 g/dL (ref 3.5–5.2)
Alkaline Phosphatase: 80 U/L (ref 39–117)
BUN: 8 mg/dL (ref 6–23)
CO2: 29 mEq/L (ref 19–32)
Calcium: 9.7 mg/dL (ref 8.4–10.5)
Chloride: 97 mEq/L (ref 96–112)
Creatinine, Ser: 0.85 mg/dL (ref 0.50–1.35)
GFR calc Af Amer: >90 mL/min (ref >90)
GFR calc non Af Amer: 89 mL/min — ABNORMAL LOW (ref >90)
Glucose, Bld: 116 mg/dL — ABNORMAL HIGH (ref 70–99)
Potassium: 4.3 mEq/L (ref 3.7–5.3)
Sodium: 137 mEq/L (ref 137–147)
Total Bilirubin: 0.5 mg/dL (ref 0.3–1.2)
Total Protein: 7.6 g/dL (ref 6.0–8.3)

## 2014-01-23 LAB — URINALYSIS, ROUTINE W REFLEX MICROSCOPIC
Bilirubin Urine: NEGATIVE
Glucose, UA: NEGATIVE mg/dL
Hgb urine dipstick: NEGATIVE
Ketones, ur: NEGATIVE mg/dL
Leukocytes, UA: NEGATIVE
Nitrite: NEGATIVE
Protein, ur: NEGATIVE mg/dL
Specific Gravity, Urine: 1.01 (ref 1.005–1.030)
Urobilinogen, UA: 0.2 mg/dL (ref 0.0–1.0)
pH: 6 (ref 5.0–8.0)

## 2014-01-23 LAB — LACTIC ACID, PLASMA: Lactic Acid, Venous: 1.3 mmol/L (ref 0.5–2.2)

## 2014-01-23 LAB — LIPASE, BLOOD: Lipase: 15 U/L (ref 11–59)

## 2014-01-23 LAB — TROPONIN I: Troponin I: <0.3 ng/mL (ref <0.30)

## 2014-01-23 IMAGING — CT CT ABD-PELV W/ CM
2 of 4 series · 15 of 46 positions shown, 17 images · IV contrast (Omnipaque 300)
Comparison: CT chest [DATE]

CLINICAL DATA: Constipation, abdominal pain

EXAM:
CT ABDOMEN AND PELVIS WITH CONTRAST
TECHNIQUE: Multidetector CT imaging of the abdomen and pelvis was performed
using the standard protocol following bolus administration of
intravenous contrast.
CONTRAST:  50mL OMNIPAQUE IOHEXOL 300 MG/ML SOLN, 100mL OMNIPAQUE
IOHEXOL 300 MG/ML SOLN

[Series 2: abd_pel_with 5.0 b40f · axial · 0.68mm/px · z∈[-506,-91]mm · 12 of 93 slices shown, 14 images]
[im 5/93  soft-tissue]
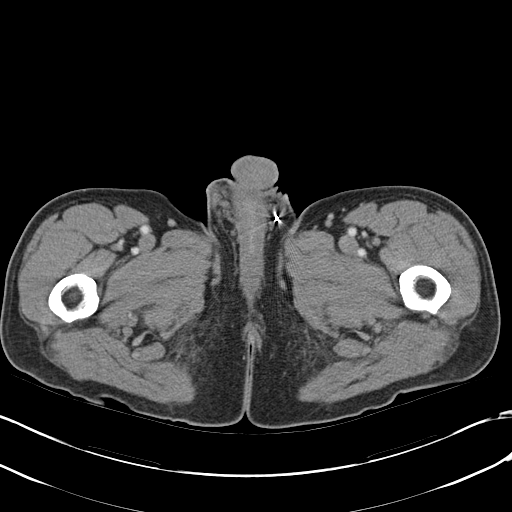
[im 5/93  bone]
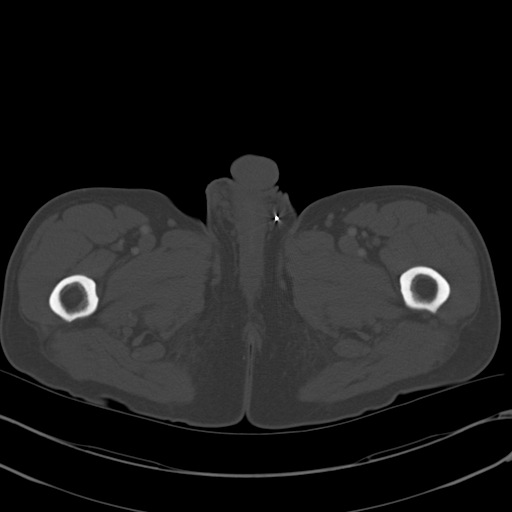
[im 14/93  soft-tissue]
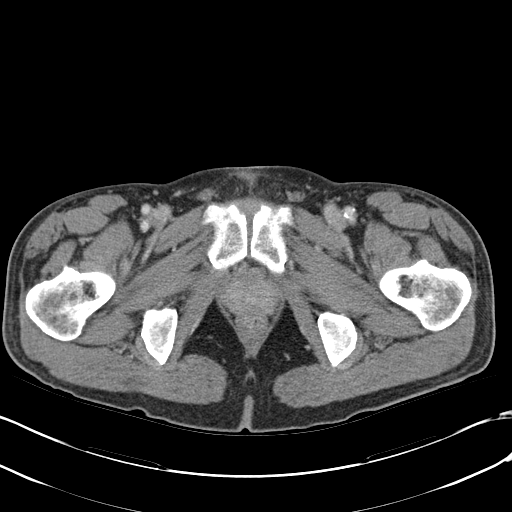
[im 22/93  soft-tissue]
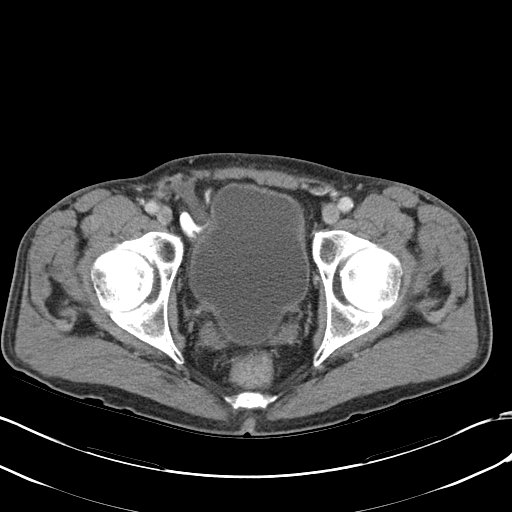
[im 27/93  soft-tissue]
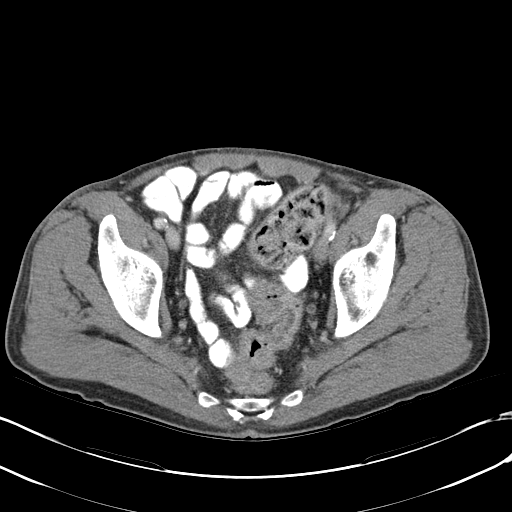
[im 36/93  soft-tissue]
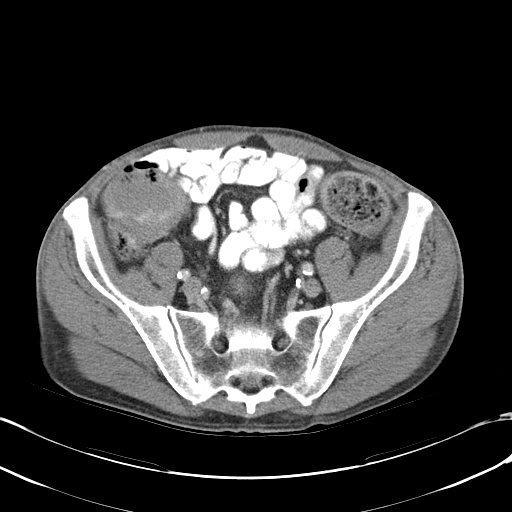
[im 44/93  soft-tissue]
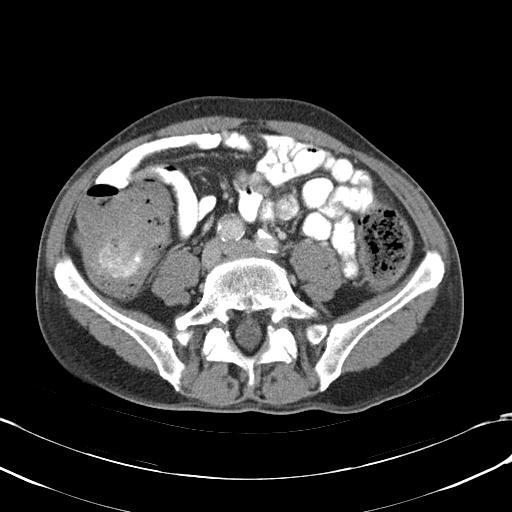
[im 49/93  soft-tissue]
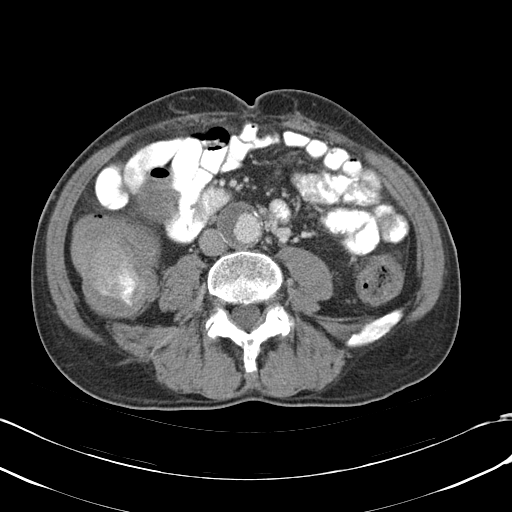
[im 57/93  soft-tissue]
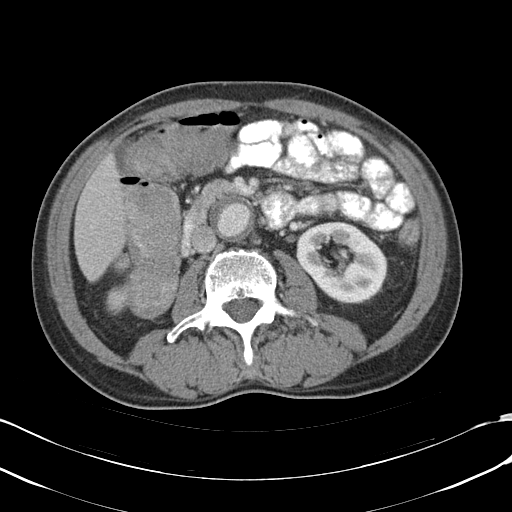
[im 66/93  soft-tissue]
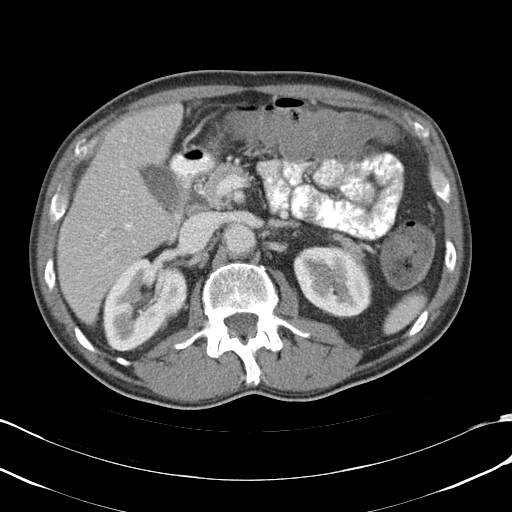
[im 66/93  bone]
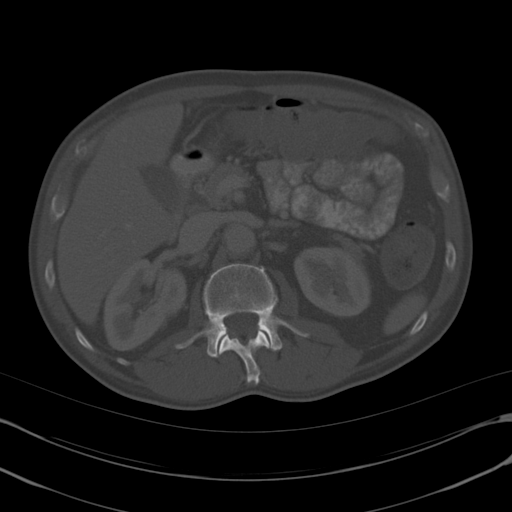
[im 71/93  soft-tissue]
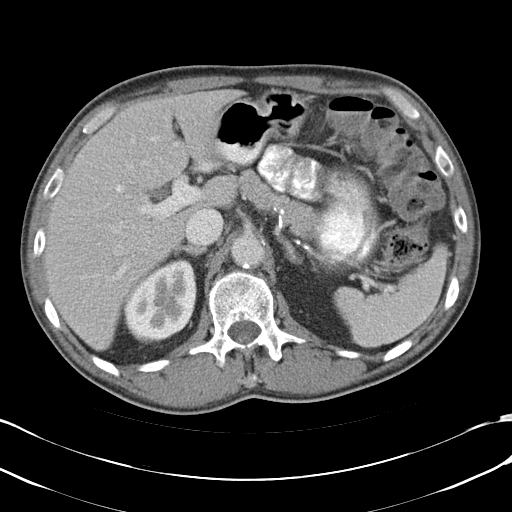
[im 79/93  soft-tissue]
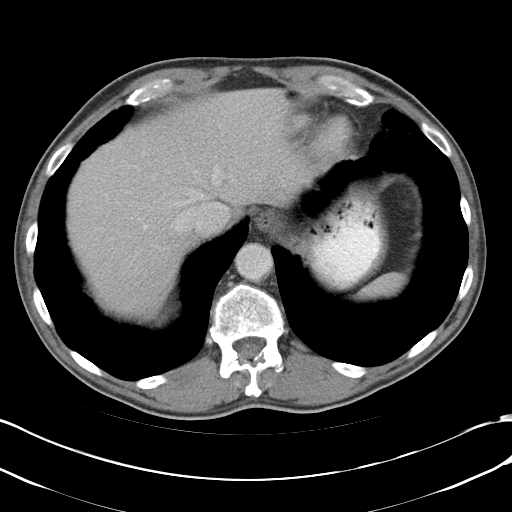
[im 88/93  soft-tissue]
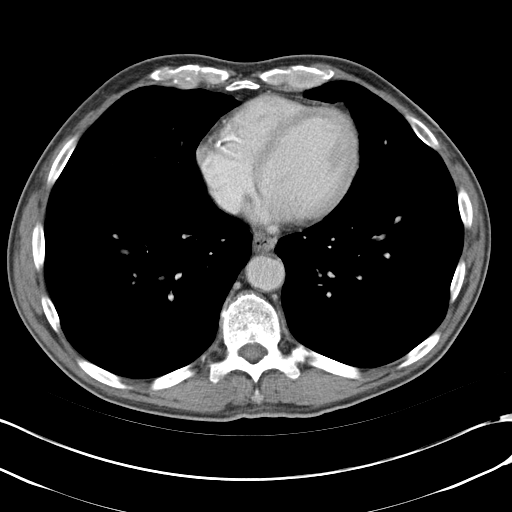

[Series 4: abd_pel_with 3.0 spo cor · coronal · 0.69mm/px · 3 of 78 slices shown]
[im 26/78  soft-tissue]
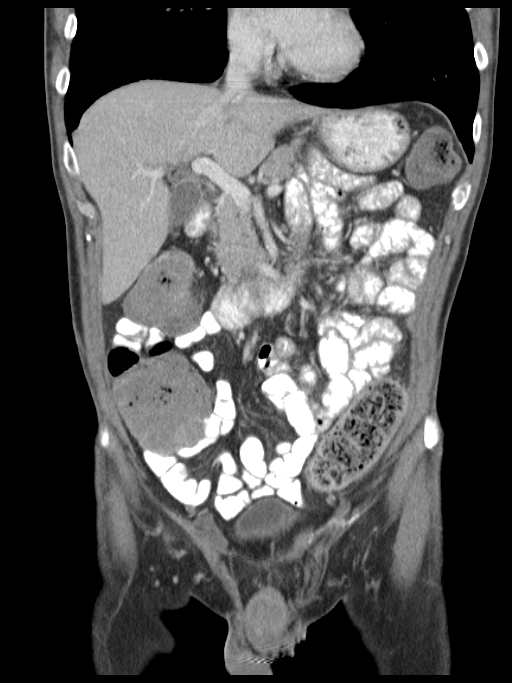
[im 35/78  soft-tissue]
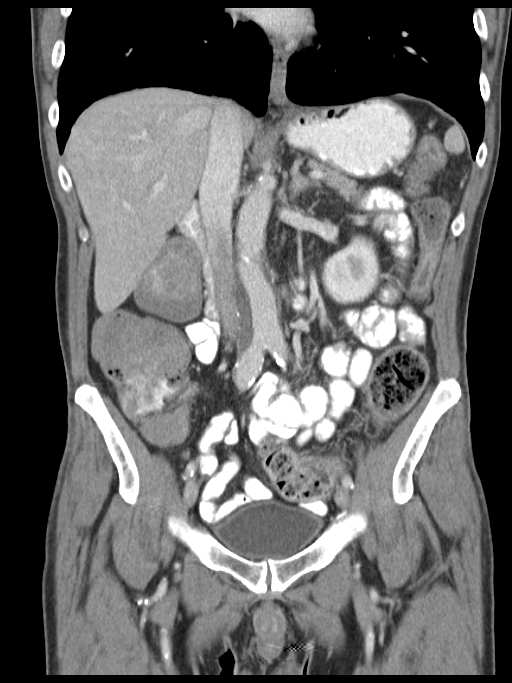
[im 43/78  soft-tissue]
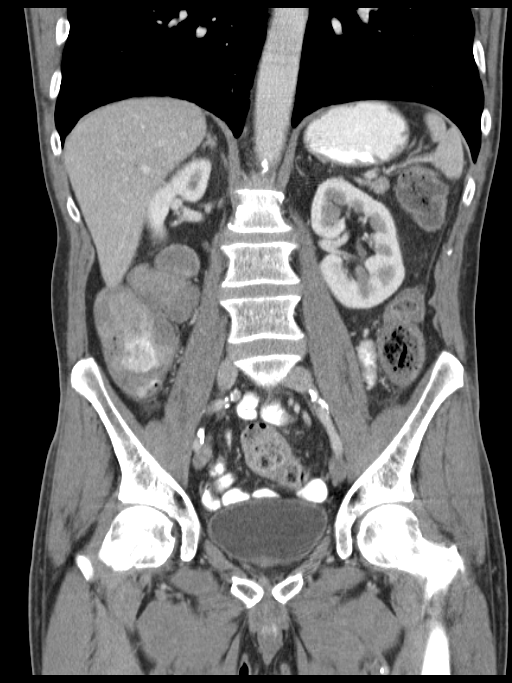

[15 of 46 positions shown; findings below may reference images not displayed]

FINDINGS: Emphysematous changes lung bases again noted. Stable nodules in
right middle lobe.

Sagittal images of the spine shows mild degenerative changes
thoracolumbar spine. There is mild anterolisthesis about 5 mm L5 on
S1 vertebral body. Disc space flattening at L5-S1 level.

Liver, spleen, pancreas and adrenals are unremarkable. Kidneys are
symmetrical in size and enhancement. Atherosclerotic calcifications
of abdominal aorta and iliac arteries. There is aneurysm of distal
abdominal aorta measures 3.4 by 3.5 cm. Aneurysm of right common
iliac artery measures 2 cm. Left common iliac artery measures
cm. Mural thrombosis noted within abdominal aortic aneurysm.

No calcified gallstones are noted within gallbladder.

No small bowel obstruction. No ascites or free air. No adenopathy.
Delayed renal images shows bilateral renal symmetrical excretion.
Bilateral visualized proximal ureter is unremarkable.

There is moderate distension of the right colon and transverse colon
with liquid and stool. Moderate stool noted in left colon. Abundant
stool noted in proximal sigmoid colon. The distal sigmoid colon and
rectum are empty partially collapsed. No definite obstructing mass
is identified.

Mild distended urinary bladder. Prostate gland and seminal vesicles
are unremarkable.

No pericecal inflammation. Normal appendix. The terminal ileum is
unremarkable. No inguinal adenopathy. No destructive bony lesions
are noted within pelvis.
IMPRESSION: 1. Emphysematous changes again noted lung bases. Stable nodules in
right middle lobe.
2. No hydronephrosis or hydroureter.
3. No small bowel obstruction.
4. There is moderate distension of proximal colon with liquid and
stool. Abundant stool noted in proximal sigmoid colon. No definite
colonic obstruction. The distal sigmoid colon and rectum are empty
partially collapsed.
5. No pericecal inflammation.  Normal appendix.
6. There is aneurysm of distal abdominal aorta measures 3.5 x
cm. Aneurysmal right common iliac artery measures 2 cm.

## 2014-01-23 IMAGING — CR DG CHEST 2V
2 series · 2 of 2 positions shown · non-contrast
Comparison: [DATE]

CLINICAL DATA: Abdominal pain, constipation

EXAM:
CHEST  2 VIEW

[view not recorded (1 of 2)]
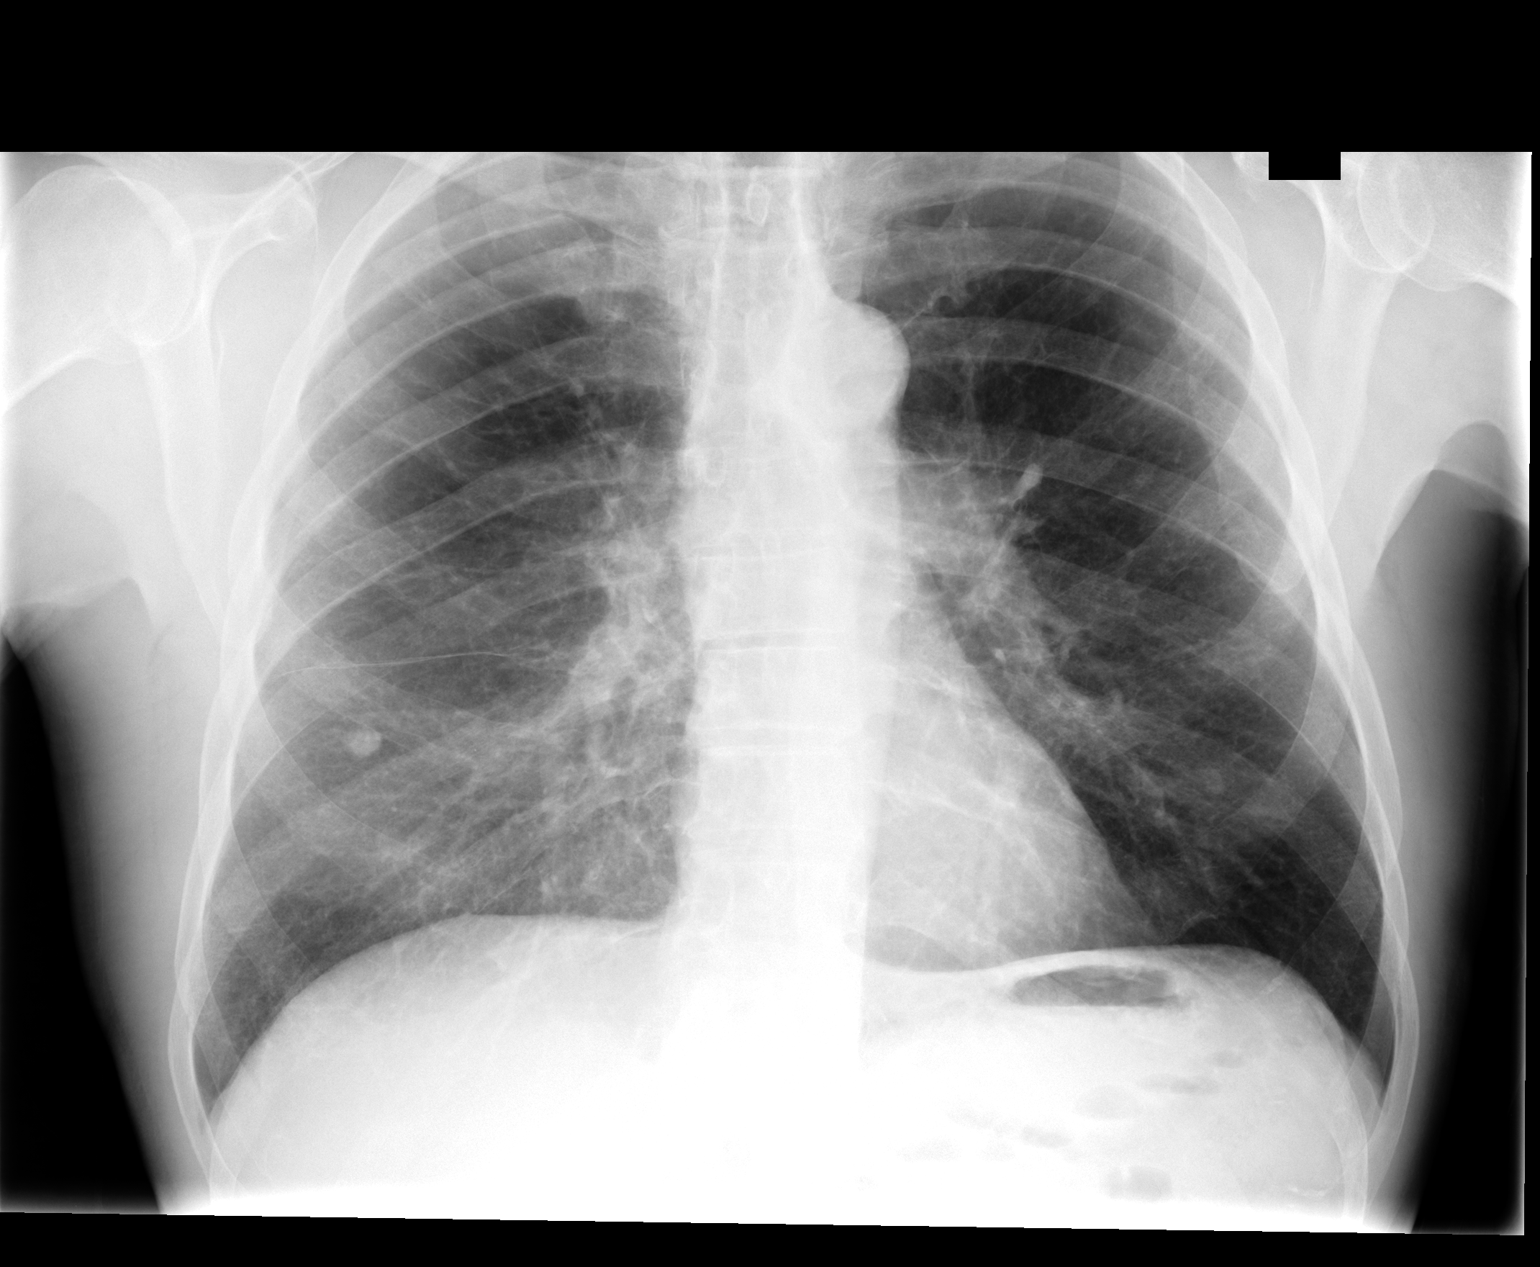

[view not recorded (2 of 2)]
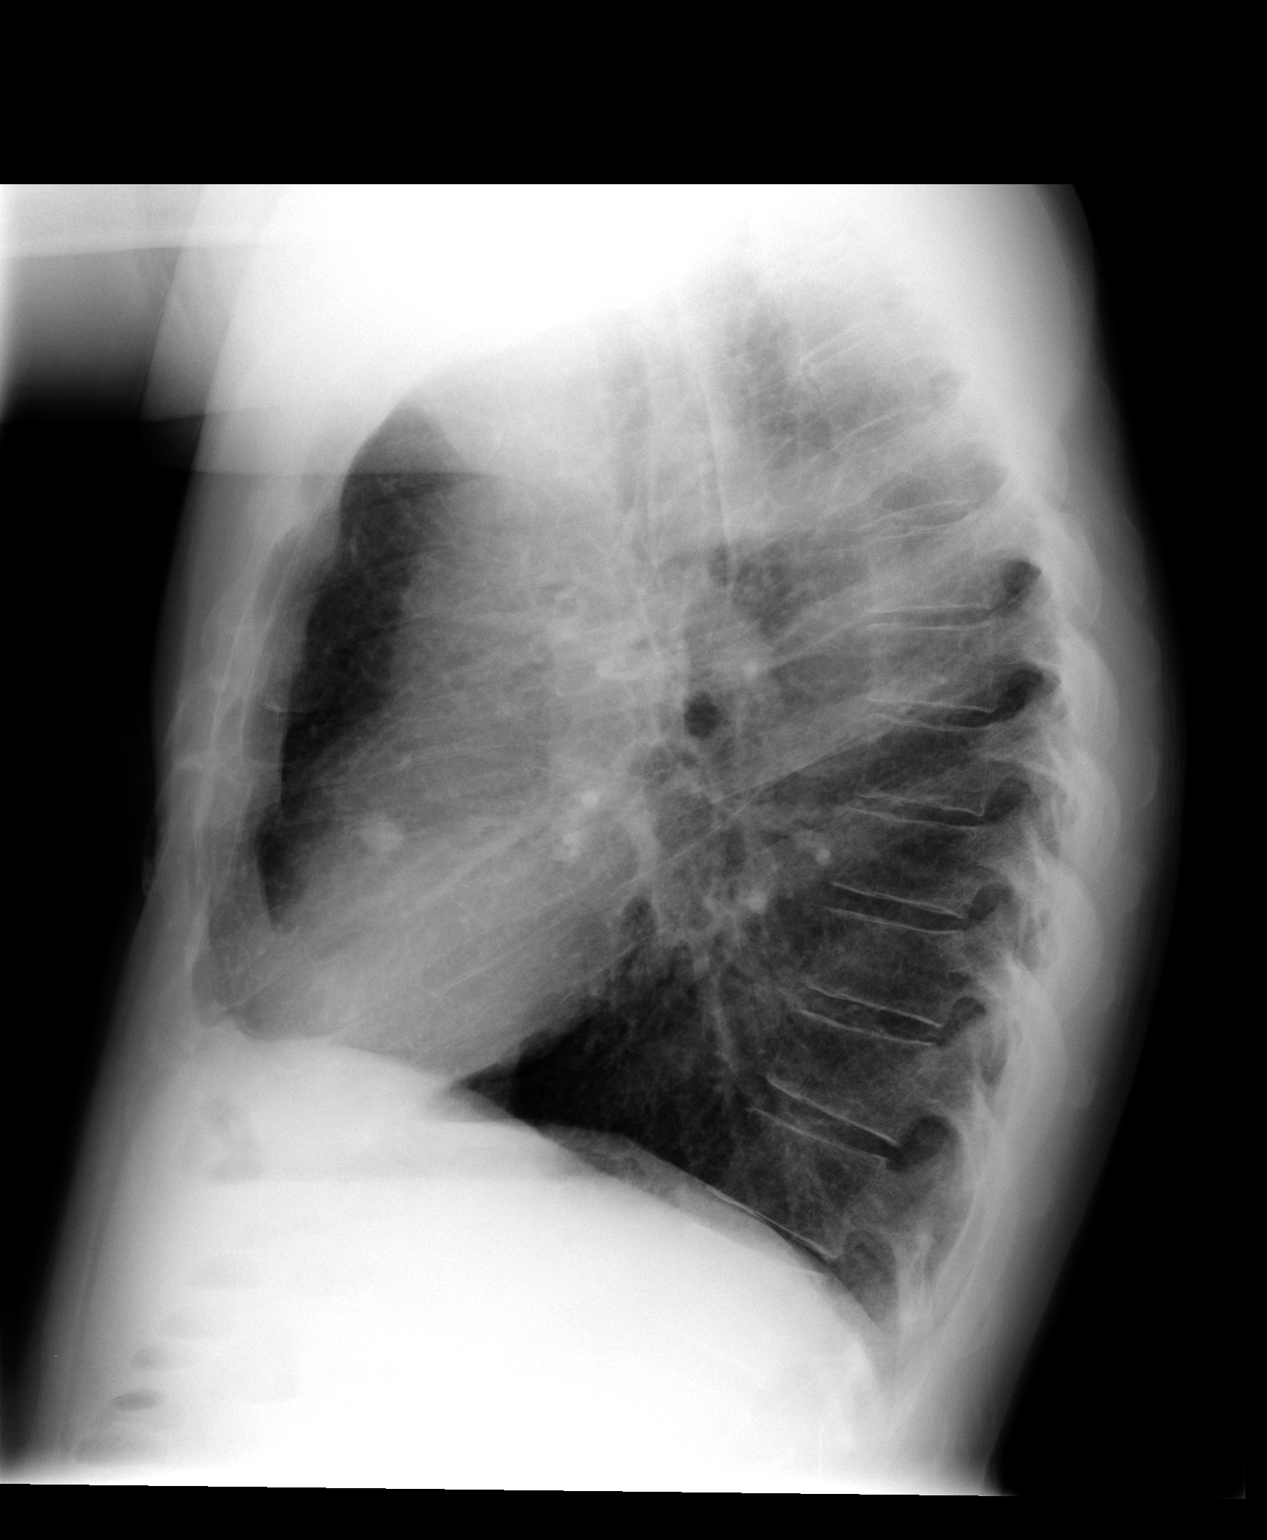

[2 of 2 positions shown; findings below may reference images not displayed]

FINDINGS: Cardiomediastinal silhouette is stable. Stable hyperinflation and
chronic interstitial prominence. Stable nodule in right middle lobe.
No acute infiltrate or pulmonary edema.
IMPRESSION: Stable COPD.  No active disease.

## 2014-01-23 MED ORDER — IOHEXOL 300 MG/ML  SOLN
50.0000 mL | Freq: Once | INTRAMUSCULAR | Status: AC | PRN
  Administered 2014-01-23: 50 mL via ORAL

## 2014-01-23 MED ORDER — SODIUM CHLORIDE 0.9 % IV SOLN
INTRAVENOUS | Status: DC
  Administered 2014-01-23: 1000 mL via INTRAVENOUS

## 2014-01-23 MED ORDER — ONDANSETRON HCL 4 MG/2ML IJ SOLN
4.0000 mg | INTRAMUSCULAR | Status: DC | PRN
  Administered 2014-01-23: 4 mg via INTRAVENOUS
  Filled 2014-01-23: qty 2

## 2014-01-23 MED ORDER — IOHEXOL 300 MG/ML  SOLN
100.0000 mL | Freq: Once | INTRAMUSCULAR | Status: AC | PRN
  Administered 2014-01-23: 100 mL via INTRAVENOUS

## 2014-01-23 MED ORDER — MORPHINE SULFATE 4 MG/ML IJ SOLN
4.0000 mg | INTRAMUSCULAR | Status: DC | PRN
  Administered 2014-01-23: 4 mg via INTRAVENOUS
  Filled 2014-01-23: qty 1

## 2014-01-23 NOTE — Discharge Instructions (Signed)
°Emergency Department Resource Guide °1) Find a Doctor and Pay Out of Pocket °Although you won't have to find out who is covered by your insurance plan, it is a good idea to ask around and get recommendations. You will then need to call the office and see if the doctor you have chosen will accept you as a new patient and what types of options they offer for patients who are self-pay. Some doctors offer discounts or will set up payment plans for their patients who do not have insurance, but you will need to ask so you aren't surprised when you get to your appointment. ° °2) Contact Your Local Health Department °Not all health departments have doctors that can see patients for sick visits, but many do, so it is worth a call to see if yours does. If you don't know where your local health department is, you can check in your phone book. The CDC also has a tool to help you locate your state's health department, and many state websites also have listings of all of their local health departments. ° °3) Find a Walk-in Clinic °If your illness is not likely to be very severe or complicated, you may want to try a walk in clinic. These are popping up all over the country in pharmacies, drugstores, and shopping centers. They're usually staffed by nurse practitioners or physician assistants that have been trained to treat common illnesses and complaints. They're usually fairly quick and inexpensive. However, if you have serious medical issues or chronic medical problems, these are probably not your best option. ° °No Primary Care Doctor: °- Call [facility] Connect at  832-8000 - they can help you locate a primary care doctor that  accepts your insurance, provides certain services, etc. °- Physician Referral Service- 1-800-533-3463 ° °Chronic Pain Problems: °Organization         Address  Phone   Notes  °[facility]  (336) [phone] Patients need to be referred by their primary care doctor.  ° °Medication  Assistance: °Organization         Address  Phone   Notes  °[place] [facility] [address] °[place], [place] [address] (336) 641-8030 --Must be a resident of [place] °-- Must have NO insurance coverage whatsoever (no Medicaid/ Medicare, etc.) °-- The pt. MUST have a primary care doctor that directs their care regularly and follows them in the community °  °[facility]  (866) 331-1348   °[employer]  (888) 892-1162   ° °Agencies that provide inexpensive medical care: °Organization         Address  Phone   Notes  °[facility]  (336) 832-8035   °[facility]    (336) 832-7272   °[facility] [address] °[place], [place] [address] (336) 832-4777   °[facility] [address], °[place] (336) 271-4999   °[facility]    (336) 373-0678   °[facility]    (336) 272-1050   °[facility] ° [address], [place] Phone:  (336) 832-4444, Fax:  (336) 832-4440 Hours of Operation:  9 am - 6 pm, M-F.  Also accepts Medicaid/Medicare and self-pay.  °[facility] ° [address], [place] Phone: (336) [phone], Fax: (336) [phone]. Hours of Operation:  8:30 am - 5:30 pm, M-F.  Also accepts Medicaid and self-pay.  °[facility] [place] [address], [place] Phone: (336) 878-6027   °[facility] [address], [place], [place] (336)723-1848, Ext. 123 Mondays & Thursdays: 7-9 AM.  First 15 patients are seen on a first come, first serve basis. °  ° °Medicaid-accepting [place] Providers: ° °Organization         Address  Phone   Notes  °[facility] [address], [place] (336) 641-2100 Also accepts self-pay patients.  °[facility] [address], [place] ° (336) 856-9996   °[facility] [address], [place]  (336) 288-8857   °[facility] [address] [place] [address], [place] (336) 299-7000   °Veita Bland [address], [place]  ° (336) 373-1557 Only accepts [place] Access Medicaid patients after they have their name applied to their card.  ° °Self-Pay (no insurance) in [place]: ° °Organization         Address  Phone   Notes  °Sickle Cell Patients, [facility] [address], [place] (336) 832-1970   °[facility] [address], [place] (336) 832-4400   °[facility] ° [address], [place] (336) 992-4800   °[facility]/Dr. Osei-Bonsu ° [address] [place] [address], [place] or [address], [place] (336) [phone] Phone number for both [place] and [place] locations is the same.  °[facility] [address], [place] (336) 299-0000   °[facility] [address] [place] [address], [place] or [address] (336) 852-7530 °(336) 878-2260   °[facility] [address], [place] (336) 350-1642, phone; (336) 294-5005, fax Sees patients 1st and 3rd Saturday of every month.  Must not qualify for public or private insurance (i.e. Medicaid, Medicare, [place] Health Choice, Veterans' Benefits) • Household income should be no more than 200% of the poverty level •The clinic cannot treat you if you are pregnant or think you are pregnant • Sexually transmitted diseases are not treated at the clinic.  ° ° °Dental Care: °Organization         Address  Phone  Notes  °[place] [facility] [address], [place] (336) 641-6152 Accepts children up to age 21 who are enrolled in Medicaid or [place] Health Choice; pregnant women with a Medicaid card; and children who have applied for Medicaid or [place] Health Choice, but were declined, whose parents can pay a reduced fee at time of service.  °[place]  [facility] [place]  [address], [place] (336) 641-7733 Accepts children up to age 21 who are enrolled in Medicaid or [place] Health Choice; pregnant women with a Medicaid card; and children who have applied for Medicaid or [place] Health Choice, but were declined, whose parents can pay a reduced fee at time of service.  °[facility] ° [address], [place] (336) 641-4533 Patients are seen by appointment only. Walk-ins are not accepted. [facility] will see patients 18 years of age and older. °Monday - Tuesday (8am-5pm) °Most Wednesdays (8:30-5pm) °$30 per visit, cash only  °[facility] ° [address], [place] (336) 641-4533 Patients are seen by appointment only. Walk-ins are not accepted. [facility] will see patients 18 years of age and older. °One   Wednesday Evening (Monthly: Volunteer Based).  $30 per visit, cash only  °[facility]  (919) [phone] for adults; Children under age 4, call Graduate Pediatric Dentistry at ([phone]) [phone]. Children aged 4-14, please call ([phone]) [phone] to request a pediatric application. ° Dental services are provided in all areas of dental care including fillings, crowns and bridges, complete and partial dentures, implants, gum treatment, root canals, and extractions. Preventive care is also provided. Treatment is provided to both adults and children. °Patients are selected via a lottery and there is often a waiting list. °  °[facility] [address], °[place] ° (336) 763-8833 [email or web address] °  °[facility] [address], [place], [place] (336)723-1848, Ext. 123 Second and Fourth Thursday of each month, opens at 6:30 AM; Clinic ends at 9 AM.  Patients are seen on a first-come first-served basis, and a limited number are seen during each clinic.  ° °[facility] ° [address], [place], [place] (336) 723-7904    Eligibility Requirements °You must have lived in [place], [place], or [place] counties for at least the last three months. °  You cannot be eligible for state or federal sponsored healthcare insurance, including Veterans Administration, Medicaid, or Medicare. °  You generally cannot be eligible for healthcare insurance through your employer.  °  How to apply: °Eligibility screenings are held every Tuesday and Wednesday afternoon from 1:00 pm until 4:00 pm. You do not need an appointment for the interview!  °[facility] [address], [place], [place] 336-631-2330   °[facility]  336-342-8273   °[facility]  336-703-3100   °[facility]  336-570-6415   ° °Behavioral Health Resources in the Community: °Intensive Outpatient Programs °Organization         Address  Phone  Notes  °[facility] [address], [place], [place] 336-878-6098   °[facility] [address], [place], [place] 336-832-9800   °[facility] [address], [place], [place] ° 336-882-2125   °[facility] [address],  °[place], [place] 1-800-853-5163 or 336-641-4981   °Substance Abuse Resources °Organization         Address  Phone  Notes  °[facility]  336-882-2125   °[facility]  336-784-9470   °[facility]  336-285-9073   °[facility]  336-845-3988   °Residential & Outpatient Substance Abuse Program  1-800-659-3381   °Psychological Services °Organization         Address  Phone  Notes  °[facility]  336- 832-9600   °[facility]  336- 378-7881   °[facility] [address], [place] 1-800-853-5163 or 336-641-4981   ° °Mobile Crisis Teams °Organization         Address  Phone  Notes  °[facility], Mobile Crisis Care Unit  1-877-626-1772   °Assertive °[facility] ° [address]  [place], [place] 336-834-9664   °Sharon DeEsch [address] °[place] 336-554-5454   ° °Self-Help/Support Groups °Organization         Address  Phone             Notes  °[facility] of  - variety of support groups  336- 373-1402 Call for more information  °Narcotics Anonymous (NA), [facility] [address], °[place]  2 meetings at this location  ° °  Residential Treatment Programs Organization         Address  Phone  Notes  [facility] [address],    [place]  [phone]   [facility]  [address], [place] [phone], [place], [place] [phone]   [facility] [address], [place] [phone] Admissions: 8am-3pm M-F  [facility] [address]    [place], [place] [phone]   [facility] [address], [place], [place] [phone]   [facility] [address]  [place], [place] [phone]   [facility] Intensive Outpatient [address], [place], [place] [phone]   [facility] ([facility] [address]  [place], [place] [phone] or [phone]   [facility] [address] [place], [place] [phone] Accepts Medicaid  [facility] [address]  [place] [phone] Substance Abuse/Addiction Treatment   [facility]         Address  Phone  Notes  [facility]  [phone](888) [phone]   Angie FavaJulie Brannon, PhD [address], [address] [place], [place]   [phone](336) [phone] or [phone](336) [phone]   [facility]   [address] [place], [place] [phone](336) [phone]   [facility] [address], [place], [place] [phone](336) [phone] Insurance/Medicaid/sponsorship through [facility] [address]                                    [place], [place] [phone](336) [phone] Therapy/tele-psych/case    [facility] [address]   [place], [place] [phone](336) [phone]    Dr. Lolly MustacheArfeen  [phone](336) [phone]   [facility] [place] [facility]  [employer] [facility] 1) [address], [place] 2) [address], [place] 3)  [address], [place] [phone](336) [phone]  [phone](336) [phone]   [facility] Child Abuse Hotline 810-227-8957(336) (930)[phone] or ([phone](336) [phone] (After Hours)       Take over the counter laxative (such as miralax, milk of magnesia, senokot) AND a dulcolax suppository today and repeat both tomorrow.  Begin to take over the counter stool softener (colace), as directed on packaging, for the next month.  Continue to take your usual prescriptions as previously directed.  Call your regular medical doctor tomorrow to schedule a follow up appointment within the next 2 days. Call the Vascular Surgeon tomorrow morning to schedule a follow up appointment within the next 1 week.  Return to the Emergency Department immediately if worsening.

## 2014-01-23 NOTE — ED Provider Notes (Signed)
CSN: [id number]     Arrival date & time 01/23/14  1356 History   First MD Initiated Contact with Patient 01/23/14 1432     Chief Complaint  Patient presents with  . Abdominal Pain      HPI Pt was seen at 1430.  Per pt, c/o gradual onset and persistence of constant generalized abd "pain" for the past 3 weeks, worse today.  Has been associated with constipation. States he has not had a BM "in 3 weeks." Describes the abd pain as "aching." States he took kaopectate without improvement. States he took one dose of Milk of Magnesia 3 hours ago without results.  Denies N/V/D, no fevers, no back pain, no rash, no CP/SOB, no black or blood in stools.        Past Medical History  Diagnosis Date  . Anxiety   . Arthritis   . Pulmonary nodule     benign  . COPD (chronic obstructive pulmonary disease)    Past Surgical History  Procedure Laterality Date  . Inguinal hernia repair Bilateral     History  Substance Use Topics  . Smoking status: Current Every Day Smoker  . Smokeless tobacco: Not on file  . Alcohol Use: No    Review of Systems ROS: Statement: All systems negative except as marked or noted in the HPI; Constitutional: Negative for fever and chills. ; ; Eyes: Negative for eye pain, redness and discharge. ; ; ENMT: Negative for ear pain, hoarseness, nasal congestion, sinus pressure and sore throat. ; ; Cardiovascular: Negative for chest pain, palpitations, diaphoresis, dyspnea and peripheral edema. ; ; Respiratory: Negative for cough, wheezing and stridor. ; ; Gastrointestinal: +constipation, abd pain. Negative for nausea, vomiting, diarrhea, blood in stool, hematemesis, jaundice and rectal bleeding. . ; ; Genitourinary: Negative for dysuria, flank pain and hematuria. ; ; Musculoskeletal: Negative for back pain and neck pain. Negative for swelling and trauma.; ; Skin: Negative for pruritus, rash, abrasions, blisters, bruising and skin lesion.; ; Neuro: Negative for headache, lightheadedness  and neck stiffness. Negative for weakness, altered level of consciousness , altered mental status, extremity weakness, paresthesias, involuntary movement, seizure and syncope.       Allergies  Review of patient's allergies indicates no known allergies.  Home Medications  No current outpatient prescriptions on file. BP 137/81  Pulse 80  Temp(Src) 97.8 F (36.6 C) (Oral)  Resp 18  Ht 5\' 6"  (1.676 m)  Wt 131 lb (59.421 kg)  BMI 21.15 kg/m2  SpO2 98% Physical Exam 1435: Physical examination:  Nursing notes reviewed; Vital signs and O2 SAT reviewed;  Constitutional: Well developed, Well nourished, Well hydrated, In no acute distress; Head:  Normocephalic, atraumatic; Eyes: EOMI, PERRL, No scleral icterus; ENMT: Mouth and pharynx normal, Mucous membranes moist; Neck: Supple, Full range of motion, No lymphadenopathy; Cardiovascular: Regular rate and rhythm, No murmur, rub, or gallop; Respiratory: Breath sounds clear & equal bilaterally, No rales, rhonchi, wheezes.  Speaking full sentences with ease, Normal respiratory effort/excursion; Chest: Nontender, Movement normal; Abdomen: Soft, +mild diffuse tenderness to palp. No rebound or guarding. Nondistended, Normal bowel sounds; Genitourinary: No CVA tenderness; Extremities: Pulses normal, No tenderness, No edema, No calf edema or asymmetry.; Neuro: AA&Ox3, Major CN grossly intact.  Speech clear. No gross focal motor or sensory deficits in extremities. Climbs on and off stretcher easily by himself. Gait steady.; Skin: Color normal, Warm, Dry.     ED Course  Procedures     EKG Interpretation None  MDM  MDM Reviewed: previous chart, nursing note and vitals Reviewed previous: labs Interpretation: labs, x-ray and CT scan    Date: 01/23/2014  Rate: 67  Rhythm: normal sinus rhythm, artifact, baseline wander  QRS Axis: normal  Intervals: normal  ST/T Wave abnormalities: normal  Conduction Disutrbances:none  Narrative  Interpretation:   Old EKG Reviewed: unchanged; no significant changes from previous EKG dated 09/22/2008.   Results for orders placed during the hospital encounter of 01/23/14  URINALYSIS, ROUTINE W REFLEX MICROSCOPIC      Result Value Ref Range   Color, Urine YELLOW  YELLOW   APPearance CLEAR  CLEAR   Specific Gravity, Urine 1.010  1.005 - 1.030   pH 6.0  5.0 - 8.0   Glucose, UA NEGATIVE  NEGATIVE mg/dL   Hgb urine dipstick NEGATIVE  NEGATIVE   Bilirubin Urine NEGATIVE  NEGATIVE   Ketones, ur NEGATIVE  NEGATIVE mg/dL   Protein, ur NEGATIVE  NEGATIVE mg/dL   Urobilinogen, UA 0.2  0.0 - 1.0 mg/dL   Nitrite NEGATIVE  NEGATIVE   Leukocytes, UA NEGATIVE  NEGATIVE  CBC WITH DIFFERENTIAL      Result Value Ref Range   WBC 10.0  4.0 - 10.5 K/uL   RBC 4.59  4.22 - 5.81 MIL/uL   Hemoglobin 15.2  13.0 - 17.0 g/dL   HCT 45.445.0  09.839.0 - 11.952.0 %   MCV 98.0  78.0 - 100.0 fL   MCH 33.1  26.0 - 34.0 pg   MCHC 33.8  30.0 - 36.0 g/dL   RDW 14.713.2  82.911.5 - 56.215.5 %   Platelets 185  150 - 400 K/uL   Neutrophils Relative % 68  43 - 77 %   Neutro Abs 6.8  1.7 - 7.7 K/uL   Lymphocytes Relative 22  12 - 46 %   Lymphs Abs 2.2  0.7 - 4.0 K/uL   Monocytes Relative 9  3 - 12 %   Monocytes Absolute 0.9  0.1 - 1.0 K/uL   Eosinophils Relative 1  0 - 5 %   Eosinophils Absolute 0.1  0.0 - 0.7 K/uL   Basophils Relative 0  0 - 1 %   Basophils Absolute 0.0  0.0 - 0.1 K/uL  COMPREHENSIVE METABOLIC PANEL      Result Value Ref Range   Sodium 137  137 - 147 mEq/L   Potassium 4.3  3.7 - 5.3 mEq/L   Chloride 97  96 - 112 mEq/L   CO2 29  19 - 32 mEq/L   Glucose, Bld 116 (*) 70 - 99 mg/dL   BUN 8  6 - 23 mg/dL   Creatinine, Ser 1.300.85  0.50 - 1.35 mg/dL   Calcium 9.7  8.4 - 86.510.5 mg/dL   Total Protein 7.6  6.0 - 8.3 g/dL   Albumin 4.0  3.5 - 5.2 g/dL   AST 20  0 - 37 U/L   ALT 8  0 - 53 U/L   Alkaline Phosphatase 80  39 - 117 U/L   Total Bilirubin 0.5  0.3 - 1.2 mg/dL   GFR calc non Af Amer 89 (*) >90 mL/min   GFR  calc Af Amer >90  >90 mL/min  LIPASE, BLOOD      Result Value Ref Range   Lipase 15  11 - 59 U/L  LACTIC ACID, PLASMA      Result Value Ref Range   Lactic Acid, Venous 1.3  0.5 - 2.2 mmol/L  TROPONIN I  Result Value Ref Range   Troponin I <0.30  <0.30 ng/mL   Dg Chest 2 View 01/23/2014   CLINICAL DATA:  Abdominal pain, constipation  EXAM: CHEST  2 VIEW  COMPARISON:  11/02/2010  FINDINGS: Cardiomediastinal silhouette is stable. Stable hyperinflation and chronic interstitial prominence. Stable nodule in right middle lobe. No acute infiltrate or pulmonary edema.  IMPRESSION: Stable COPD.  No active disease.   Electronically Signed   By: Natasha Mead M.D.   On: 01/23/2014 16:13   Ct Abdomen Pelvis W Contrast 01/23/2014   CLINICAL DATA:  Constipation, abdominal pain  EXAM: CT ABDOMEN AND PELVIS WITH CONTRAST  TECHNIQUE: Multidetector CT imaging of the abdomen and pelvis was performed using the standard protocol following bolus administration of intravenous contrast.  CONTRAST:  50mL OMNIPAQUE IOHEXOL 300 MG/ML SOLN, OMNIPAQUE IOHEXOL 300 MG/ML SOLN  COMPARISON:  CT chest 11/11/2008  FINDINGS: Emphysematous changes lung bases again noted. Stable nodules in right middle lobe.  Sagittal images of the spine shows mild degenerative changes thoracolumbar spine. There is mild anterolisthesis about 5 mm L5 on S1 vertebral body. Disc space flattening at L5-S1 level.  Liver, spleen, pancreas and adrenals are unremarkable. Kidneys are symmetrical in size and enhancement. Atherosclerotic calcifications of abdominal aorta and iliac arteries. 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 Left common iliac artery measures 1.1 cm. 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  No calcified gallstones are noted within gallbladder.  No small bowel obstruction. No ascites or free air. No adenopathy. Delayed renal images shows bilateral  renal symmetrical excretion. Bilateral visualized proximal ureter is unremarkable.  There is moderate distension of the right colon and transverse colon with liquid and stool. Moderate stool noted in left colon. Abundant stool noted in proximal sigmoid colon. The distal sigmoid colon and rectum are empty partially collapsed. No definite obstructing mass is identified.  Mild distended urinary bladder. Prostate gland and seminal vesicles are unremarkable.  No pericecal inflammation. Normal appendix. The terminal ileum is unremarkable. No inguinal adenopathy. No destructive bony lesions are noted within pelvis.  IMPRESSION: 1. Emphysematous changes again noted lung bases. Stable nodules in right middle lobe. 2. No hydronephrosis or hydroureter. 3. No small bowel obstruction. 4. There is moderate distension of proximal colon with liquid and stool. Abundant stool noted in proximal sigmoid colon. No definite colonic obstruction. The distal sigmoid colon and rectum are empty partially collapsed. 5. No pericecal inflammation.  Normal appendix. 6. 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   Electronically Signed   By: Natasha Mead M.D.   On: 01/23/2014 16:04    1635:  Pt states he feels better now and wants to go home.  Incidental finding of AAA, right CIA aneurysm.  T/C to [facility] V[facility] Surgery Dr. Arbie Cookey, case discussed, including:  HPI, pertinent PM/SHx, VS/PE, dx testing, ED course and treatment:  States no acute surgical issue at this time, requests to have pt call ofc on Monday to schedule f/u appt within the next 1 to 2 weeks.  Dx and testing, as well as d/w Vasc Surgery Dr. Arbie Cookey, d/w pt and family.  Questions answered.  Verb understanding, agreeable to d/c home with outpt f/u.     Laray Anger, DO 01/25/14 2128

## 2014-01-23 NOTE — ED Notes (Signed)
Pt c/o abd pain and no bm x 3 weeks.  Reports has tried several OTC remedies but no results.  Says has been seeing pink on tp when he wipes after straining to have bm.  Says had physical 8 months ago and was told by the dr he needed to follow up with a pcp because "something didn't feel right in my stomach."  Pt said he did not f/u with pcp.   Denies n/v.

## 2014-01-24 LAB — URINE CULTURE
Colony Count: NO GROWTH
Culture: NO GROWTH

## 2014-02-10 DIAGNOSIS — F411 Generalized anxiety disorder: Secondary | ICD-10-CM | POA: Diagnosis not present

## 2014-02-10 DIAGNOSIS — I714 Abdominal aortic aneurysm, without rupture, unspecified: Secondary | ICD-10-CM | POA: Diagnosis not present

## 2014-02-10 DIAGNOSIS — G8929 Other chronic pain: Secondary | ICD-10-CM | POA: Diagnosis not present

## 2014-02-10 DIAGNOSIS — IMO0002 Reserved for concepts with insufficient information to code with codable children: Secondary | ICD-10-CM | POA: Diagnosis not present

## 2014-02-28 ENCOUNTER — Encounter: Payer: Self-pay | Admitting: Vascular Surgery

## 2014-03-01 ENCOUNTER — Encounter: Payer: Self-pay | Admitting: Vascular Surgery

## 2014-03-01 ENCOUNTER — Ambulatory Visit (INDEPENDENT_AMBULATORY_CARE_PROVIDER_SITE_OTHER): Payer: Medicare Other | Admitting: Vascular Surgery

## 2014-03-01 VITALS — BP 146/77 | HR 85 | Resp 18 | Ht 67.0 in | Wt 136.0 lb

## 2014-03-01 DIAGNOSIS — I714 Abdominal aortic aneurysm, without rupture, unspecified: Secondary | ICD-10-CM | POA: Insufficient documentation

## 2014-03-01 NOTE — Addendum Note (Signed)
Addended by: Sharee PimpleMCCHESNEY, MARILYN K on: 03/01/2014 11:11 AM   Modules accepted: Orders

## 2014-03-01 NOTE — Progress Notes (Signed)
Patient name: [name] R Muegge MRN: [id number] DOB: 20-Aug-1951 Sex: male   Referred by: Jeani HawkingAnnie Penn EDP  Reason for referral:  Chief Complaint  Patient presents with  . New Evaluation    evaluate AAA Referred by Dr Regino SchultzeMcGough    HISTORY OF PRESENT ILLNESS: Patient is a healthy-year-old gentleman who presented to the emergency department one month ago for abdominal discomfort. He was found to have a partial bowel obstruction which resolved spontaneously. He did have an incidental finding of a small aortic and iliac artery aneurysms. A further discussion of this. Prior knowledge of this. He otherwise is healthy with no cardiac disease. He does have COPD related to cigarette smoking.  Past Medical History  Diagnosis Date  . Anxiety   . Arthritis   . Pulmonary nodule     benign  . COPD (chronic obstructive pulmonary disease)   . AAA (abdominal aortic aneurysm)     Past Surgical History  Procedure Laterality Date  . Inguinal hernia repair Bilateral     History   Social History  . Marital Status: Divorced    Spouse Name: N/A    Number of Children: N/A  . Years of Education: N/A   Occupational History  . Not on file.   Social History Main Topics  . Smoking status: Current Every Day Smoker  . Smokeless tobacco: Never Used  . Alcohol Use: No  . Drug Use: No  . Sexual Activity: Not on file   Other Topics Concern  . Not on file   Social History Narrative  . No narrative on file    Family History  Problem Relation Age of Onset  . Heart disease Father     Allergies as of 03/01/2014  . (No Known Allergies)    Current Outpatient Prescriptions on File Prior to Visit  Medication Sig Dispense Refill  . ALPRAZolam (XANAX) 1 MG tablet Take 1 mg by mouth 3 (three) times daily as needed for anxiety.      Marland Kitchen. HYDROcodone-acetaminophen (NORCO) 7.5-325 MG per tablet Take 1 tablet by mouth 3 (three) times daily as needed for moderate pain.       No current  facility-administered medications on file prior to visit.     REVIEW OF SYSTEMS:  Positives indicated with an "X"  CARDIOVASCULAR:  [ ]  chest pain   [ ]  chest pressure   [ ]  palpitations   [ ]  orthopnea   [ ]  dyspnea on exertion   [ ]  claudication   [ ]  rest pain   [ ]  DVT   [ ]  phlebitis PULMONARY:   [ ]  productive cough   [ ]  asthma   [ ]  wheezing NEUROLOGIC:   [ ]  weakness  [ ]  paresthesias  [ ]  aphasia  [ ]  amaurosis  [ ]  dizziness HEMATOLOGIC:   [ ]  bleeding problems   [ ]  clotting disorders MUSCULOSKELETAL:  [ ]  joint pain   [ ]  joint swelling GASTROINTESTINAL: [ ]   blood in stool  [ ]   hematemesis GENITOURINARY:  [ ]   dysuria  [ ]   hematuria PSYCHIATRIC:  [ ]  history of major depression INTEGUMENTARY:  [ ]  rashes  [ ]  ulcers CONSTITUTIONAL:  [ ]  fever   [ ]  chills  PHYSICAL EXAMINATION:  General: The patient is a well-nourished male, in no acute distress. Vital signs are BP 146/77  Pulse 85  Resp 18  Ht 5\' 7"  (1.702 m)  Wt 136 lb (61.689 kg)  BMI  21.30 kg/m2 Pulmonary: There is a good air exchange bilaterally without wheezing or rales. Abdomen: Soft and non-tender with normal pitch bowel sounds. He has been and has an easily palpable aortic pulsation with no tenderness Musculoskeletal: There are no major deformities.  There is no significant extremity pain. Neurologic: No focal weakness or paresthesias are detected, Skin: There are no ulcer or rashes noted. Psychiatric: The patient has normal affect. Cardiovascular: There is a regular rate and rhythm without significant murmur appreciated. Carotid arteries without bruits bilaterally Pulse status 2+ radial pulses 2+ femoral pulses 2+ popliteal pulses and diminished pedal pulses bilaterally  CT scan was reviewed with the actual films from the emergency department. This shows infrarenal aneurysm with extension into his right iliac with maximal 2 cm right iliac aneurysm.  Impression and Plan:  Small asymptomatic  abdominal iliac artery aneurysms. I discussed this at length with the patient. I explained symptoms of ruptured aneurysm explained this would be extremely unlikely with need to report immediately to the emergency room. I did explain the natural history of slow expansion of these and recommended yearly ultrasound to rule out any significant enlargement. He understands and we will see him again in one year with ultrasound of his aorta in our office    Kristen Loaderodd F Shatima Zalar [facility] Office: [phone]

## 2014-04-26 DIAGNOSIS — G4700 Insomnia, unspecified: Secondary | ICD-10-CM | POA: Diagnosis not present

## 2014-04-26 DIAGNOSIS — IMO0002 Reserved for concepts with insufficient information to code with codable children: Secondary | ICD-10-CM | POA: Diagnosis not present

## 2014-04-26 DIAGNOSIS — F411 Generalized anxiety disorder: Secondary | ICD-10-CM | POA: Diagnosis not present

## 2014-04-26 DIAGNOSIS — G8929 Other chronic pain: Secondary | ICD-10-CM | POA: Diagnosis not present

## 2014-06-15 DIAGNOSIS — F411 Generalized anxiety disorder: Secondary | ICD-10-CM | POA: Diagnosis not present

## 2014-06-15 DIAGNOSIS — J019 Acute sinusitis, unspecified: Secondary | ICD-10-CM | POA: Diagnosis not present

## 2014-06-15 DIAGNOSIS — G8929 Other chronic pain: Secondary | ICD-10-CM | POA: Diagnosis not present

## 2014-06-15 DIAGNOSIS — Z681 Body mass index (BMI) 19 or less, adult: Secondary | ICD-10-CM | POA: Diagnosis not present

## 2014-09-14 DIAGNOSIS — Z6821 Body mass index (BMI) 21.0-21.9, adult: Secondary | ICD-10-CM | POA: Diagnosis not present

## 2014-09-14 DIAGNOSIS — G894 Chronic pain syndrome: Secondary | ICD-10-CM | POA: Diagnosis not present

## 2014-10-12 DIAGNOSIS — Z6821 Body mass index (BMI) 21.0-21.9, adult: Secondary | ICD-10-CM | POA: Diagnosis not present

## 2014-10-12 DIAGNOSIS — G894 Chronic pain syndrome: Secondary | ICD-10-CM | POA: Diagnosis not present

## 2014-12-13 DIAGNOSIS — Z6820 Body mass index (BMI) 20.0-20.9, adult: Secondary | ICD-10-CM | POA: Diagnosis not present

## 2014-12-13 DIAGNOSIS — M25519 Pain in unspecified shoulder: Secondary | ICD-10-CM | POA: Diagnosis not present

## 2014-12-13 DIAGNOSIS — K4090 Unilateral inguinal hernia, without obstruction or gangrene, not specified as recurrent: Secondary | ICD-10-CM | POA: Diagnosis not present

## 2014-12-13 DIAGNOSIS — F419 Anxiety disorder, unspecified: Secondary | ICD-10-CM | POA: Diagnosis not present

## 2015-01-20 DIAGNOSIS — I25119 Atherosclerotic heart disease of native coronary artery with unspecified angina pectoris: Secondary | ICD-10-CM

## 2015-01-20 HISTORY — PX: VALVE REPLACEMENT: SUR13

## 2015-01-20 HISTORY — PX: CORONARY ARTERY BYPASS GRAFT: SHX141

## 2015-01-20 HISTORY — PX: CARDIAC CATHETERIZATION: SHX172

## 2015-01-20 HISTORY — DX: Atherosclerotic heart disease of native coronary artery with unspecified angina pectoris: I25.119

## 2015-02-16 DIAGNOSIS — Z0289 Encounter for other administrative examinations: Secondary | ICD-10-CM | POA: Diagnosis not present

## 2015-02-16 DIAGNOSIS — R072 Precordial pain: Secondary | ICD-10-CM | POA: Diagnosis not present

## 2015-02-16 DIAGNOSIS — J449 Chronic obstructive pulmonary disease, unspecified: Secondary | ICD-10-CM | POA: Diagnosis not present

## 2015-02-17 DIAGNOSIS — I2510 Atherosclerotic heart disease of native coronary artery without angina pectoris: Secondary | ICD-10-CM | POA: Diagnosis not present

## 2015-02-17 DIAGNOSIS — I071 Rheumatic tricuspid insufficiency: Secondary | ICD-10-CM | POA: Diagnosis present

## 2015-02-17 DIAGNOSIS — E8809 Other disorders of plasma-protein metabolism, not elsewhere classified: Secondary | ICD-10-CM | POA: Diagnosis present

## 2015-02-17 DIAGNOSIS — R079 Chest pain, unspecified: Secondary | ICD-10-CM | POA: Diagnosis not present

## 2015-02-17 DIAGNOSIS — E785 Hyperlipidemia, unspecified: Secondary | ICD-10-CM | POA: Diagnosis present

## 2015-02-17 DIAGNOSIS — Z6823 Body mass index (BMI) 23.0-23.9, adult: Secondary | ICD-10-CM | POA: Diagnosis not present

## 2015-02-17 DIAGNOSIS — I209 Angina pectoris, unspecified: Secondary | ICD-10-CM | POA: Diagnosis not present

## 2015-02-17 DIAGNOSIS — I255 Ischemic cardiomyopathy: Secondary | ICD-10-CM | POA: Diagnosis not present

## 2015-02-17 DIAGNOSIS — D509 Iron deficiency anemia, unspecified: Secondary | ICD-10-CM | POA: Diagnosis not present

## 2015-02-17 DIAGNOSIS — I200 Unstable angina: Secondary | ICD-10-CM | POA: Diagnosis not present

## 2015-02-17 DIAGNOSIS — R072 Precordial pain: Secondary | ICD-10-CM | POA: Diagnosis not present

## 2015-02-17 DIAGNOSIS — J449 Chronic obstructive pulmonary disease, unspecified: Secondary | ICD-10-CM | POA: Diagnosis not present

## 2015-02-17 DIAGNOSIS — I361 Nonrheumatic tricuspid (valve) insufficiency: Secondary | ICD-10-CM | POA: Diagnosis not present

## 2015-02-17 DIAGNOSIS — I10 Essential (primary) hypertension: Secondary | ICD-10-CM | POA: Diagnosis not present

## 2015-02-17 DIAGNOSIS — F419 Anxiety disorder, unspecified: Secondary | ICD-10-CM | POA: Diagnosis present

## 2015-02-17 DIAGNOSIS — Z4682 Encounter for fitting and adjustment of non-vascular catheter: Secondary | ICD-10-CM | POA: Diagnosis not present

## 2015-02-17 DIAGNOSIS — I6523 Occlusion and stenosis of bilateral carotid arteries: Secondary | ICD-10-CM | POA: Diagnosis not present

## 2015-02-17 DIAGNOSIS — D649 Anemia, unspecified: Secondary | ICD-10-CM | POA: Diagnosis present

## 2015-02-17 DIAGNOSIS — Z8249 Family history of ischemic heart disease and other diseases of the circulatory system: Secondary | ICD-10-CM | POA: Diagnosis not present

## 2015-02-17 DIAGNOSIS — E441 Mild protein-calorie malnutrition: Secondary | ICD-10-CM | POA: Diagnosis present

## 2015-02-17 DIAGNOSIS — Z01818 Encounter for other preprocedural examination: Secondary | ICD-10-CM | POA: Diagnosis not present

## 2015-02-17 DIAGNOSIS — I25119 Atherosclerotic heart disease of native coronary artery with unspecified angina pectoris: Secondary | ICD-10-CM | POA: Diagnosis not present

## 2015-02-17 DIAGNOSIS — F17210 Nicotine dependence, cigarettes, uncomplicated: Secondary | ICD-10-CM | POA: Diagnosis present

## 2015-02-17 DIAGNOSIS — J439 Emphysema, unspecified: Secondary | ICD-10-CM | POA: Diagnosis present

## 2015-03-03 DIAGNOSIS — I10 Essential (primary) hypertension: Secondary | ICD-10-CM | POA: Diagnosis not present

## 2015-03-03 DIAGNOSIS — Z48812 Encounter for surgical aftercare following surgery on the circulatory system: Secondary | ICD-10-CM | POA: Diagnosis not present

## 2015-03-03 DIAGNOSIS — I25118 Atherosclerotic heart disease of native coronary artery with other forms of angina pectoris: Secondary | ICD-10-CM | POA: Diagnosis not present

## 2015-03-03 DIAGNOSIS — E785 Hyperlipidemia, unspecified: Secondary | ICD-10-CM | POA: Diagnosis not present

## 2015-03-03 DIAGNOSIS — I255 Ischemic cardiomyopathy: Secondary | ICD-10-CM | POA: Diagnosis not present

## 2015-03-03 DIAGNOSIS — J449 Chronic obstructive pulmonary disease, unspecified: Secondary | ICD-10-CM | POA: Diagnosis not present

## 2015-03-07 ENCOUNTER — Other Ambulatory Visit (HOSPITAL_COMMUNITY): Payer: Self-pay

## 2015-03-07 ENCOUNTER — Ambulatory Visit: Payer: Self-pay | Admitting: Vascular Surgery

## 2015-03-07 DIAGNOSIS — I369 Nonrheumatic tricuspid valve disorder, unspecified: Secondary | ICD-10-CM | POA: Diagnosis not present

## 2015-03-07 DIAGNOSIS — I2510 Atherosclerotic heart disease of native coronary artery without angina pectoris: Secondary | ICD-10-CM | POA: Diagnosis not present

## 2015-03-07 DIAGNOSIS — Z681 Body mass index (BMI) 19 or less, adult: Secondary | ICD-10-CM | POA: Diagnosis not present

## 2015-03-07 DIAGNOSIS — T829XXA Unspecified complication of cardiac and vascular prosthetic device, implant and graft, initial encounter: Secondary | ICD-10-CM | POA: Diagnosis not present

## 2015-03-08 DIAGNOSIS — J449 Chronic obstructive pulmonary disease, unspecified: Secondary | ICD-10-CM | POA: Diagnosis not present

## 2015-03-08 DIAGNOSIS — E785 Hyperlipidemia, unspecified: Secondary | ICD-10-CM | POA: Diagnosis not present

## 2015-03-08 DIAGNOSIS — I25118 Atherosclerotic heart disease of native coronary artery with other forms of angina pectoris: Secondary | ICD-10-CM | POA: Diagnosis not present

## 2015-03-08 DIAGNOSIS — I10 Essential (primary) hypertension: Secondary | ICD-10-CM | POA: Diagnosis not present

## 2015-03-08 DIAGNOSIS — Z48812 Encounter for surgical aftercare following surgery on the circulatory system: Secondary | ICD-10-CM | POA: Diagnosis not present

## 2015-03-08 DIAGNOSIS — I255 Ischemic cardiomyopathy: Secondary | ICD-10-CM | POA: Diagnosis not present

## 2015-03-10 DIAGNOSIS — I25118 Atherosclerotic heart disease of native coronary artery with other forms of angina pectoris: Secondary | ICD-10-CM | POA: Diagnosis not present

## 2015-03-10 DIAGNOSIS — I10 Essential (primary) hypertension: Secondary | ICD-10-CM | POA: Diagnosis not present

## 2015-03-10 DIAGNOSIS — I255 Ischemic cardiomyopathy: Secondary | ICD-10-CM | POA: Diagnosis not present

## 2015-03-10 DIAGNOSIS — J449 Chronic obstructive pulmonary disease, unspecified: Secondary | ICD-10-CM | POA: Diagnosis not present

## 2015-03-10 DIAGNOSIS — E785 Hyperlipidemia, unspecified: Secondary | ICD-10-CM | POA: Diagnosis not present

## 2015-03-10 DIAGNOSIS — Z48812 Encounter for surgical aftercare following surgery on the circulatory system: Secondary | ICD-10-CM | POA: Diagnosis not present

## 2015-03-13 ENCOUNTER — Ambulatory Visit (INDEPENDENT_AMBULATORY_CARE_PROVIDER_SITE_OTHER): Payer: Medicare Other | Admitting: Cardiovascular Disease

## 2015-03-13 ENCOUNTER — Encounter: Payer: Self-pay | Admitting: Cardiovascular Disease

## 2015-03-13 VITALS — BP 112/78 | HR 72 | Ht 67.0 in | Wt 125.0 lb

## 2015-03-13 DIAGNOSIS — Z951 Presence of aortocoronary bypass graft: Secondary | ICD-10-CM | POA: Diagnosis not present

## 2015-03-13 DIAGNOSIS — E785 Hyperlipidemia, unspecified: Secondary | ICD-10-CM

## 2015-03-13 DIAGNOSIS — I25810 Atherosclerosis of coronary artery bypass graft(s) without angina pectoris: Secondary | ICD-10-CM

## 2015-03-13 DIAGNOSIS — Z9889 Other specified postprocedural states: Secondary | ICD-10-CM

## 2015-03-13 DIAGNOSIS — Z87898 Personal history of other specified conditions: Secondary | ICD-10-CM

## 2015-03-13 DIAGNOSIS — I10 Essential (primary) hypertension: Secondary | ICD-10-CM

## 2015-03-13 DIAGNOSIS — F17201 Nicotine dependence, unspecified, in remission: Secondary | ICD-10-CM

## 2015-03-13 DIAGNOSIS — Z9289 Personal history of other medical treatment: Secondary | ICD-10-CM

## 2015-03-13 NOTE — Patient Instructions (Signed)
Your physician recommends that you schedule a follow-up appointment in: 3 months with Dr Purvis SheffieldKoneswaran   Your physician recommends that you continue on your current medications as directed. Please refer to the Current Medication list given to you today.     You have been referred to cardiac rehab     Thank you for choosing [facility] !

## 2015-03-13 NOTE — Progress Notes (Signed)
Patient ID: [name], male   DOB: [birth year], 64 y.o.   MRN: [id number]       CARDIOLOGY CONSULT NOTE  Patient ID: [name] MRN: [id number] DOB/AGE: May 09, 1951 64 y.o.  Admit date: (Not on file) Primary Physician Wilson Singer, MD  Reason for Consultation: s/p CABG  HPI: The patient is a 64 year old male with a past medical history significant for multivessel coronary artery disease who recently underwent coronary artery bypass grafting 2 with a reverse saphenous vein graft to the RCA and a LIMA to the LAD. He also underwent tricuspid valve repair using a 26 mm [employer] M3 annuloplasty ring.   Additional past medical history includes abdominal aortic aneurysm followed by vascular surgery, hypertension, hyperlipidemia, and COPD.   Bypass surgery was performed on 02/23/15. Echocardiogram on 02/22/15 reportedly demonstrated mildly reduced left ventricular systolic function, EF 40%, mild mitral regurgitation, aortic valve sclerosis, and moderate to severe tricuspid valve regurgitation. The operation was performed at [facility] in [place], [place].   ECG on 02/16/15 demonstrated normal sinus rhythm and anterolateral infarct.   Coronary angiography on 02/17/15 demonstrated the left main coronary artery to have mild 30-40% stenosis, 100% LAD stenosis with right to left collaterals and faint left to left collaterals, small nondominant left circumflex, small to medium caliber first obtuse marginal branch with 20% ostial and proximal stenosis. The RCA was noted to have diffuse disease.  He was discharged on aspirin 81 mg, Plavix 75 mg, Lipitor, Lasix, potassium, and amiodarone 200 mg twice daily.  I do not have a copy of his most recent blood work including CBC, basic metabolic panel, and lipids.  He is feeling very well and denies exertional chest pain and shortness of breath. He also denies palpitations and leg swelling. He lives in  [place] but had gone down to [place] to visit his girlfriend when all of this transpired.  He is here with his son, and the patient lives in a guest home behind his son.  He has quit smoking for the past one month, but had done so for 60 years.   No Known Allergies  Current Outpatient Prescriptions  Medication Sig Dispense Refill  . ALPRAZolam (XANAX) 1 MG tablet Take 1 mg by mouth 3 (three) times daily as needed for anxiety.    Marland Kitchen aspirin EC 81 MG tablet Take 81 mg by mouth daily.    Marland Kitchen docusate sodium (COLACE) 100 MG capsule Take 100 mg by mouth daily.    Marland Kitchen HYDROcodone-acetaminophen (NORCO) 7.5-325 MG per tablet Take 1 tablet by mouth 3 (three) times daily as needed for moderate pain.    Marland Kitchen tiotropium (SPIRIVA) 18 MCG inhalation capsule Place 18 mcg into inhaler and inhale daily.    Marland Kitchen amiodarone (PACERONE) 200 MG tablet Take 200 mg by mouth 2 (two) times daily.   0  . atorvastatin (LIPITOR) 40 MG tablet Take 40 mg by mouth daily at 6 PM.   3  . carvedilol (COREG) 3.125 MG tablet Take 3.125 mg by mouth 2 (two) times daily with a meal.   3  . clopidogrel (PLAVIX) 75 MG tablet Take 75 mg by mouth daily.   3  . furosemide (LASIX) 20 MG tablet Take 20 mg by mouth 2 (two) times daily.   0  . potassium chloride SA (K-DUR,KLOR-CON) 20 MEQ tablet Take 20 mEq by mouth daily.   0   No current facility-administered medications for this visit.  Past Medical History  Diagnosis Date  . Anxiety   . Arthritis   . Pulmonary nodule     benign  . COPD (chronic obstructive pulmonary disease)   . AAA (abdominal aortic aneurysm)     Past Surgical History  Procedure Laterality Date  . Inguinal hernia repair Bilateral     History   Social History  . Marital Status: Divorced    Spouse Name: N/A  . Number of Children: N/A  . Years of Education: N/A   Occupational History  . Not on file.   Social History Main Topics  . Smoking status: Former Smoker    Quit date: 02/11/2015  .  Smokeless tobacco: Never Used  . Alcohol Use: No  . Drug Use: No  . Sexual Activity: Not on file   Other Topics Concern  . Not on file   Social History Narrative     No family history of premature CAD in 1st degree relatives.  Prior to Admission medications   Medication Sig Start Date End Date Taking? Authorizing Provider  ALPRAZolam Prudy Feeler(XANAX) 1 MG tablet Take 1 mg by mouth 3 (three) times daily as needed for anxiety.   Yes Historical Provider, MD  aspirin EC 81 MG tablet Take 81 mg by mouth daily.   Yes Historical Provider, MD  docusate sodium (COLACE) 100 MG capsule Take 100 mg by mouth daily.   Yes Historical Provider, MD  HYDROcodone-acetaminophen (NORCO) 7.5-325 MG per tablet Take 1 tablet by mouth 3 (three) times daily as needed for moderate pain.   Yes Historical Provider, MD  tiotropium (SPIRIVA) 18 MCG inhalation capsule Place 18 mcg into inhaler and inhale daily.   Yes Historical Provider, MD  amiodarone (PACERONE) 200 MG tablet Take 200 mg by mouth 2 (two) times daily.  02/28/15   Historical Provider, MD  atorvastatin (LIPITOR) 40 MG tablet Take 40 mg by mouth daily at 6 PM.  02/28/15   Historical Provider, MD  carvedilol (COREG) 3.125 MG tablet Take 3.125 mg by mouth 2 (two) times daily with a meal.  02/28/15   Historical Provider, MD  clopidogrel (PLAVIX) 75 MG tablet Take 75 mg by mouth daily.  02/28/15   Historical Provider, MD  furosemide (LASIX) 20 MG tablet Take 20 mg by mouth 2 (two) times daily.  02/28/15   Historical Provider, MD  potassium chloride SA (K-DUR,KLOR-CON) 20 MEQ tablet Take 20 mEq by mouth daily.  02/28/15   Historical Provider, MD     Review of systems complete and found to be negative unless listed above in HPI     Physical exam Blood pressure 112/78, pulse 72, height 5\' 7"  (1.702 m), weight 125 lb (56.7 kg). General: NAD Neck: No JVD, no thyromegaly or thyroid nodule.  Lungs: Clear to auscultation bilaterally with normal respiratory effort. CV:  Well healed midline sternotomy incision. Regular rate and rhythm, normal S1/S2, no S3/S4, no murmur.  No peripheral edema.  No carotid bruit.    Abdomen: Soft, nontender, no distention.  Skin: Well healed midline sternotomy incision.  Neurologic: Alert and oriented x 3.  Psych: Normal affect. Extremities: No clubbing or cyanosis.  HEENT: Normal.   ECG: Most recent ECG reviewed.  Labs:   Lab Results  Component Value Date   WBC 10.0 01/23/2014   HGB 15.2 01/23/2014   HCT 45.0 01/23/2014   MCV 98.0 01/23/2014   PLT 185 01/23/2014   No results for input(s): NA, K, CL, CO2, BUN, CREATININE, CALCIUM, PROT, BILITOT, ALKPHOS, ALT,  AST, GLUCOSE in the last 168 hours.  Invalid input(s): LABALBU Lab Results  Component Value Date   TROPONINI <0.30 01/23/2014   No results found for: CHOL No results found for: HDL No results found for: LDLCALC No results found for: TRIG No results found for: CHOLHDL No results found for: LDLDIRECT       Studies: No results found.  ASSESSMENT AND PLAN:  1. CAD s/p 2-vessel CABG, EF 40%: Symptomatically stable. For the time being, continue aspirin 81 mg, Plavix, carvedilol, and Lipitor. I suspect he had some postoperative atrial fibrillation and for this reason was placed on amiodarone. He is currently in a regular rhythm. I will likely discontinue amiodarone at his next visit. If renal function is normal, I would consider the addition of lisinopril. Will obtain all relevant [facility] from [facility]. Will enroll in cardiac rehabilitation.  2. Essential HTN: Well controlled at present. No changes today.  3. Hyperlipidemia: Obtain copy of lipids from [facility]. Continue Lipitor 40 mg.  4. Tricuspid regurgitation s/p annuloplasty: No murmurs. Stable.  Dispo: f/u 3 months.   Signed: Prentice Docker, M.D., F.A.C.C.  03/13/2015, 1:09 PM

## 2015-03-14 DIAGNOSIS — E785 Hyperlipidemia, unspecified: Secondary | ICD-10-CM | POA: Diagnosis not present

## 2015-03-14 DIAGNOSIS — I255 Ischemic cardiomyopathy: Secondary | ICD-10-CM | POA: Diagnosis not present

## 2015-03-14 DIAGNOSIS — Z48812 Encounter for surgical aftercare following surgery on the circulatory system: Secondary | ICD-10-CM | POA: Diagnosis not present

## 2015-03-14 DIAGNOSIS — J449 Chronic obstructive pulmonary disease, unspecified: Secondary | ICD-10-CM | POA: Diagnosis not present

## 2015-03-14 DIAGNOSIS — I10 Essential (primary) hypertension: Secondary | ICD-10-CM | POA: Diagnosis not present

## 2015-03-14 DIAGNOSIS — I25118 Atherosclerotic heart disease of native coronary artery with other forms of angina pectoris: Secondary | ICD-10-CM | POA: Diagnosis not present

## 2015-03-17 DIAGNOSIS — E785 Hyperlipidemia, unspecified: Secondary | ICD-10-CM | POA: Diagnosis not present

## 2015-03-17 DIAGNOSIS — I25118 Atherosclerotic heart disease of native coronary artery with other forms of angina pectoris: Secondary | ICD-10-CM | POA: Diagnosis not present

## 2015-03-17 DIAGNOSIS — J449 Chronic obstructive pulmonary disease, unspecified: Secondary | ICD-10-CM | POA: Diagnosis not present

## 2015-03-17 DIAGNOSIS — Z48812 Encounter for surgical aftercare following surgery on the circulatory system: Secondary | ICD-10-CM | POA: Diagnosis not present

## 2015-03-17 DIAGNOSIS — I255 Ischemic cardiomyopathy: Secondary | ICD-10-CM | POA: Diagnosis not present

## 2015-03-17 DIAGNOSIS — I10 Essential (primary) hypertension: Secondary | ICD-10-CM | POA: Diagnosis not present

## 2015-03-20 DIAGNOSIS — I10 Essential (primary) hypertension: Secondary | ICD-10-CM | POA: Diagnosis not present

## 2015-03-20 DIAGNOSIS — J449 Chronic obstructive pulmonary disease, unspecified: Secondary | ICD-10-CM | POA: Diagnosis not present

## 2015-03-20 DIAGNOSIS — I25118 Atherosclerotic heart disease of native coronary artery with other forms of angina pectoris: Secondary | ICD-10-CM | POA: Diagnosis not present

## 2015-03-20 DIAGNOSIS — I255 Ischemic cardiomyopathy: Secondary | ICD-10-CM | POA: Diagnosis not present

## 2015-03-20 DIAGNOSIS — E785 Hyperlipidemia, unspecified: Secondary | ICD-10-CM | POA: Diagnosis not present

## 2015-03-20 DIAGNOSIS — Z48812 Encounter for surgical aftercare following surgery on the circulatory system: Secondary | ICD-10-CM | POA: Diagnosis not present

## 2015-03-21 ENCOUNTER — Encounter (HOSPITAL_COMMUNITY): Payer: Self-pay

## 2015-03-21 ENCOUNTER — Encounter (HOSPITAL_COMMUNITY)
Admission: RE | Admit: 2015-03-21 | Discharge: 2015-03-21 | Disposition: A | Discharge status: Home / Self Care | Payer: Medicare Other | Source: Ambulatory Visit | Attending: Cardiovascular Disease | Admitting: Cardiovascular Disease

## 2015-03-21 DIAGNOSIS — I2510 Atherosclerotic heart disease of native coronary artery without angina pectoris: Secondary | ICD-10-CM | POA: Insufficient documentation

## 2015-03-21 DIAGNOSIS — Z951 Presence of aortocoronary bypass graft: Secondary | ICD-10-CM | POA: Diagnosis not present

## 2015-03-21 DIAGNOSIS — E7800 Pure hypercholesterolemia, unspecified: Secondary | ICD-10-CM | POA: Insufficient documentation

## 2015-03-21 DIAGNOSIS — I25759 Atherosclerosis of native coronary artery of transplanted heart with unspecified angina pectoris: Secondary | ICD-10-CM | POA: Diagnosis present

## 2015-03-21 NOTE — Patient Instructions (Signed)
Pt has finished orientation and is scheduled to start CR on June 3rd, 2016 at 0930. Pt has been instructed to arrive to class 15 minutes early for scheduled class. Pt has been instructed to wear comfortable clothing and shoes with rubber soles. Pt has been told to take their medications 1 hour prior to coming to class.  If the patient is not going to attend class, he has been instructed to call.

## 2015-03-21 NOTE — Progress Notes (Signed)
Patient arrived at 1430.  Patient referred to Cardiac Rehab by Dr. Purvis SheffieldKoneswaran due to CABG (Z95.1).  Dr.Koneswaran is his cardiologist and Dr. Karilyn CotaGosrani is his PCP.  During orientation advised patient on arrival and appointment times what to wear, what to do before, during and after exercise.  Reviewed attendance and class policy.  Talked about inclement weather and class consultation policy. Patient is scheduled to start cardiac Rehab on June 3rd, 2016 at 0930.  Patient was advised to come to class 5 minutes before class starts.  He was also given instructions on meeting with the dietician and attending the Family Structure classes. Pt is eager to get started. Patient finished pre six minute walk test.  Orientation and education ended at 1630.

## 2015-03-21 NOTE — Progress Notes (Signed)
Cardiac/Pulmonary Rehab Medication Review by a Pharmacist  Does the patient  feel that his/her medications are working for him/her?  yes  Has the patient been experiencing any side effects to the medications prescribed?  yes  Does the patient measure his/her own blood pressure or blood glucose at home?  He has a nurse that comes to house thress times/week and checks BP   Does the patient have any problems obtaining medications due to transportation or finances?   yes  Understanding of regimen: good Understanding of indications: good Potential of compliance: excellent  Questions asked to Determine Patient Understanding of Medication Regimen:  1. What is the name of the medication?  2. What is the medication used for?  3. When should it be taken?  4. How much should be taken?  5. How will you take it?  6. What side effects should you report?  Understanding Defined as: Excellent: All questions above are correct Good: Questions 1-4 are correct Fair: Questions 1-2 are correct  Poor: 1 or none of the above questions are correct   Pharmacist comments: Patient states he has been nauseated recently which he relates to KDur- but in further questioning uncertain this is cause.  He describes using his daughter's Phenergan tablets with relief.   He is also concerned he only has 30 day supply of medications from cardiac surgeon & does not see cardiologist until August.      Elson ClanLilliston, Matti Minney Michelle 03/21/2015 2:58 PM

## 2015-03-23 ENCOUNTER — Telehealth: Payer: Self-pay | Admitting: Cardiovascular Disease

## 2015-03-23 ENCOUNTER — Other Ambulatory Visit: Payer: Self-pay

## 2015-03-23 MED ORDER — AMIODARONE HCL 200 MG PO TABS
200.0000 mg | ORAL_TABLET | Freq: Two times a day (BID) | ORAL | Status: DC
Start: 2015-03-23 — End: 2015-06-13

## 2015-03-23 MED ORDER — POTASSIUM CHLORIDE CRYS ER 20 MEQ PO TBCR: 20.0000 meq | EXTENDED_RELEASE_TABLET | Freq: Every day | ORAL | Status: DC

## 2015-03-23 NOTE — Telephone Encounter (Signed)
Refill complete 

## 2015-03-23 NOTE — Telephone Encounter (Signed)
Pt wondered if he could get 90 day refills on meds,he is going to call his pharmacy and clarify,we will refill as asked

## 2015-03-23 NOTE — Telephone Encounter (Signed)
pls call concerning pts medications

## 2015-03-24 ENCOUNTER — Encounter (HOSPITAL_COMMUNITY)
Admission: RE | Admit: 2015-03-24 | Discharge: 2015-03-24 | Disposition: A | Discharge status: Home / Self Care | Payer: Medicare Other | Source: Ambulatory Visit | Attending: Cardiovascular Disease | Admitting: Cardiovascular Disease

## 2015-03-24 DIAGNOSIS — Z951 Presence of aortocoronary bypass graft: Secondary | ICD-10-CM | POA: Diagnosis not present

## 2015-03-24 DIAGNOSIS — I25759 Atherosclerosis of native coronary artery of transplanted heart with unspecified angina pectoris: Secondary | ICD-10-CM | POA: Diagnosis present

## 2015-03-24 DIAGNOSIS — I2510 Atherosclerotic heart disease of native coronary artery without angina pectoris: Secondary | ICD-10-CM | POA: Diagnosis not present

## 2015-03-27 ENCOUNTER — Encounter: Payer: Self-pay | Admitting: Vascular Surgery

## 2015-03-27 ENCOUNTER — Encounter (HOSPITAL_COMMUNITY)
Admission: RE | Admit: 2015-03-27 | Discharge: 2015-03-27 | Disposition: A | Discharge status: Home / Self Care | Payer: Medicare Other | Source: Ambulatory Visit | Attending: Cardiovascular Disease | Admitting: Cardiovascular Disease

## 2015-03-27 DIAGNOSIS — Z951 Presence of aortocoronary bypass graft: Secondary | ICD-10-CM | POA: Diagnosis not present

## 2015-03-27 DIAGNOSIS — I2510 Atherosclerotic heart disease of native coronary artery without angina pectoris: Secondary | ICD-10-CM | POA: Diagnosis not present

## 2015-03-27 NOTE — Progress Notes (Signed)
Cardiac Rehabilitation Program Outcomes Report   Orientation:  03/21/15 Graduate Date:  tbd Discharge Date:  tbd # of sessions completed: 3  Cardiologist: Purvis SheffieldKoneswaran Family MD:  Betsy PriesGolding Class Time:  0930  A.  Exercise Program:  Tolerates exercise @ 3.32 METS for 15 minutes and Walk Test Results:  Pre: 2.89 mets  B.  Mental Health:  Good mental attitude  C.  Education/Instruction/Skills  Accurately checks own pulse.  Rest:  62  Exercise:  89 and Knows THR for exercise  Uses Perceived Exertion Scale and/or Dyspnea Scale  D.  Nutrition/Weight Control/Body Composition:  Adherence to prescribed nutrition program: fair    E.  Blood Lipids   No results found for: CHOL, HDL, LDLCALC, LDLDIRECT, TRIG, CHOLHDL  F.  Lifestyle Changes:  Making positive lifestyle changes  G.  Symptoms noted with exercise:  Asymptomatic  Report Completed By:  Doretha Sou Jettie Lazare RN   Comments:  This is patients first week progress note for [facility].

## 2015-03-28 ENCOUNTER — Ambulatory Visit (INDEPENDENT_AMBULATORY_CARE_PROVIDER_SITE_OTHER): Payer: Medicare Other | Admitting: Vascular Surgery

## 2015-03-28 ENCOUNTER — Encounter: Payer: Self-pay | Admitting: Vascular Surgery

## 2015-03-28 ENCOUNTER — Ambulatory Visit (HOSPITAL_COMMUNITY)
Admission: RE | Admit: 2015-03-28 | Discharge: 2015-03-28 | Disposition: A | Discharge status: Home / Self Care | Payer: Medicare Other | Source: Ambulatory Visit | Attending: Vascular Surgery | Admitting: Vascular Surgery

## 2015-03-28 VITALS — BP 131/72 | HR 72 | Resp 14 | Ht 67.0 in | Wt 128.0 lb

## 2015-03-28 DIAGNOSIS — I714 Abdominal aortic aneurysm, without rupture, unspecified: Secondary | ICD-10-CM

## 2015-03-28 DIAGNOSIS — I25810 Atherosclerosis of coronary artery bypass graft(s) without angina pectoris: Secondary | ICD-10-CM | POA: Diagnosis not present

## 2015-03-28 NOTE — Progress Notes (Signed)
Patient presents today for follow-up of small abdominal aortic aneurysm. I'd seen him one year ago. He did have a CT scan of his abdomen with partial small bowel obstruction showing a 3.5 cm infrarenal abdominal aortic aneurysm and a 2 cm right common iliac artery aneurysm. He is seen today for one-year ultrasound follow-up to rule out any enlargement. Since my last visit with him he had a emergent coronary artery bypass grafting in [facility] about 5 weeks ago. Had chest pain and was found to have three-vessel disease and underwent uneventful bypass. He is recovering quite nicely from this. He has no symptoms referable to his aneurysm and no symptoms of lower extremity arterial insufficiency  Past Medical History  Diagnosis Date  . Anxiety   . Arthritis   . Pulmonary nodule     benign  . COPD (chronic obstructive pulmonary disease)   . AAA (abdominal aortic aneurysm)     History  Substance Use Topics  . Smoking status: Former Smoker    Quit date: 02/11/2015  . Smokeless tobacco: Never Used  . Alcohol Use: No    Family History  Problem Relation Age of Onset  . Heart disease Father     No Known Allergies   Current outpatient prescriptions:  .  ALPRAZolam (XANAX) 1 MG tablet, Take 1 mg by mouth 3 (three) times daily as needed for anxiety., Disp: , Rfl:  .  amiodarone (PACERONE) 200 MG tablet, Take 1 tablet (200 mg total) by mouth 2 (two) times daily., Disp: 60 tablet, Rfl: 3 .  aspirin EC 81 MG tablet, Take 81 mg by mouth daily., Disp: , Rfl:  .  atorvastatin (LIPITOR) 40 MG tablet, Take 40 mg by mouth daily at 6 PM. , Disp: , Rfl: 3 .  carvedilol (COREG) 3.125 MG tablet, Take 3.125 mg by mouth daily with lunch. , Disp: , Rfl: 3 .  clopidogrel (PLAVIX) 75 MG tablet, Take 75 mg by mouth daily. , Disp: , Rfl: 3 .  docusate sodium (COLACE) 100 MG capsule, Take 100 mg by mouth daily., Disp: , Rfl:  .  furosemide (LASIX) 20 MG tablet, Take 20 mg by mouth 2 (two) times  daily. , Disp: , Rfl: 0 .  HYDROcodone-acetaminophen (NORCO) 7.5-325 MG per tablet, Take 1 tablet by mouth 3 (three) times daily as needed for moderate pain., Disp: , Rfl:  .  Multiple Vitamins-Minerals (MULTIVITAMIN WITH MINERALS) tablet, Take 1 tablet by mouth daily. Patient chews 2 gummies each day, Disp: , Rfl:  .  potassium chloride SA (K-DUR,KLOR-CON) 20 MEQ tablet, Take 1 tablet (20 mEq total) by mouth daily., Disp: 30 tablet, Rfl: 6 .  tiotropium (SPIRIVA) 18 MCG inhalation capsule, Place 18 mcg into inhaler and inhale daily., Disp: , Rfl:   Filed Vitals:   03/28/15 1022  BP: 131/72  Pulse: 72  Resp: 14  Height:  (1.702 m)  Weight: 128 lb (58.06 kg)    Body mass index is 20.04 kg/(m^2).       Physical exam is well-developed well-nourished white male no acute distress. Respirations are equal nonlabored. Sternotomy incision is healing quite nicely. Soft nontender. He is thin and does have a prominent aortic pulsation. There is no tenderness over his aorta. Palpable femoral pulses bilaterally Mostly intact neurologically  He underwent repeat ultrasound today. This shows no change maximal diameter is 3.25 cm. Common iliac artery remains at 2 cm.  I reviewed this with patient. Explained there is no reason for any  limitation in his activity. He will continue his full activities. We will see him again in one year with repeat ultrasound to rule out any enlargement of his aneurysms

## 2015-03-29 ENCOUNTER — Encounter (HOSPITAL_COMMUNITY)
Admission: RE | Admit: 2015-03-29 | Discharge: 2015-03-29 | Disposition: A | Discharge status: Home / Self Care | Payer: Medicare Other | Source: Ambulatory Visit | Attending: Cardiovascular Disease | Admitting: Cardiovascular Disease

## 2015-03-29 DIAGNOSIS — I2510 Atherosclerotic heart disease of native coronary artery without angina pectoris: Secondary | ICD-10-CM | POA: Diagnosis not present

## 2015-03-29 DIAGNOSIS — Z951 Presence of aortocoronary bypass graft: Secondary | ICD-10-CM | POA: Diagnosis not present

## 2015-03-29 NOTE — Addendum Note (Signed)
Addended by: Adria DillELDRIDGE-LEWIS, Delynn Olvera L on: 03/29/2015 05:40 PM   Modules accepted: Orders

## 2015-03-29 NOTE — Addendum Note (Signed)
Addended by: Adria DillELDRIDGE-LEWIS, Jasani Lengel L on: 03/29/2015 05:25 PM   Modules accepted: Orders

## 2015-03-31 ENCOUNTER — Encounter (HOSPITAL_COMMUNITY)
Admission: RE | Admit: 2015-03-31 | Discharge: 2015-03-31 | Disposition: A | Discharge status: Home / Self Care | Payer: Medicare Other | Source: Ambulatory Visit | Attending: Cardiovascular Disease | Admitting: Cardiovascular Disease

## 2015-03-31 DIAGNOSIS — Z6820 Body mass index (BMI) 20.0-20.9, adult: Secondary | ICD-10-CM | POA: Diagnosis not present

## 2015-03-31 DIAGNOSIS — I2510 Atherosclerotic heart disease of native coronary artery without angina pectoris: Secondary | ICD-10-CM | POA: Diagnosis not present

## 2015-03-31 DIAGNOSIS — F419 Anxiety disorder, unspecified: Secondary | ICD-10-CM | POA: Diagnosis not present

## 2015-03-31 DIAGNOSIS — G894 Chronic pain syndrome: Secondary | ICD-10-CM | POA: Diagnosis not present

## 2015-03-31 DIAGNOSIS — Z951 Presence of aortocoronary bypass graft: Secondary | ICD-10-CM | POA: Diagnosis not present

## 2015-04-03 ENCOUNTER — Encounter (HOSPITAL_COMMUNITY)
Admission: RE | Admit: 2015-04-03 | Discharge: 2015-04-03 | Disposition: A | Discharge status: Home / Self Care | Payer: Medicare Other | Source: Ambulatory Visit | Attending: Cardiovascular Disease | Admitting: Cardiovascular Disease

## 2015-04-03 DIAGNOSIS — Z951 Presence of aortocoronary bypass graft: Secondary | ICD-10-CM | POA: Diagnosis not present

## 2015-04-03 DIAGNOSIS — I2510 Atherosclerotic heart disease of native coronary artery without angina pectoris: Secondary | ICD-10-CM | POA: Diagnosis not present

## 2015-04-05 ENCOUNTER — Encounter (HOSPITAL_COMMUNITY)
Admission: RE | Admit: 2015-04-05 | Discharge: 2015-04-05 | Disposition: A | Discharge status: Home / Self Care | Payer: Medicare Other | Source: Ambulatory Visit | Attending: Cardiovascular Disease | Admitting: Cardiovascular Disease

## 2015-04-05 DIAGNOSIS — I2510 Atherosclerotic heart disease of native coronary artery without angina pectoris: Secondary | ICD-10-CM | POA: Diagnosis not present

## 2015-04-05 DIAGNOSIS — Z951 Presence of aortocoronary bypass graft: Secondary | ICD-10-CM | POA: Diagnosis not present

## 2015-04-07 ENCOUNTER — Encounter (HOSPITAL_COMMUNITY)
Admission: RE | Admit: 2015-04-07 | Discharge: 2015-04-07 | Disposition: A | Discharge status: Home / Self Care | Payer: Medicare Other | Source: Ambulatory Visit | Attending: Cardiovascular Disease | Admitting: Cardiovascular Disease

## 2015-04-07 DIAGNOSIS — Z951 Presence of aortocoronary bypass graft: Secondary | ICD-10-CM | POA: Diagnosis not present

## 2015-04-07 DIAGNOSIS — I2510 Atherosclerotic heart disease of native coronary artery without angina pectoris: Secondary | ICD-10-CM | POA: Diagnosis not present

## 2015-04-10 ENCOUNTER — Encounter (HOSPITAL_COMMUNITY)
Admission: RE | Admit: 2015-04-10 | Discharge: 2015-04-10 | Disposition: A | Discharge status: Home / Self Care | Payer: Medicare Other | Source: Ambulatory Visit | Attending: Cardiovascular Disease | Admitting: Cardiovascular Disease

## 2015-04-10 DIAGNOSIS — I2510 Atherosclerotic heart disease of native coronary artery without angina pectoris: Secondary | ICD-10-CM | POA: Diagnosis not present

## 2015-04-10 DIAGNOSIS — Z951 Presence of aortocoronary bypass graft: Secondary | ICD-10-CM | POA: Diagnosis not present

## 2015-04-10 NOTE — Progress Notes (Signed)
Patient was given Individual Home Exercise Plan today, 04/10/15. Handout was reviewed and discussed. Patient verbalized an understanding. 

## 2015-04-12 ENCOUNTER — Encounter (HOSPITAL_COMMUNITY)
Admission: RE | Admit: 2015-04-12 | Discharge: 2015-04-12 | Disposition: A | Discharge status: Home / Self Care | Payer: Medicare Other | Source: Ambulatory Visit | Attending: Cardiovascular Disease | Admitting: Cardiovascular Disease

## 2015-04-12 DIAGNOSIS — Z951 Presence of aortocoronary bypass graft: Secondary | ICD-10-CM | POA: Diagnosis not present

## 2015-04-12 DIAGNOSIS — I2510 Atherosclerotic heart disease of native coronary artery without angina pectoris: Secondary | ICD-10-CM | POA: Diagnosis not present

## 2015-04-14 ENCOUNTER — Encounter (HOSPITAL_COMMUNITY)
Admission: RE | Admit: 2015-04-14 | Discharge: 2015-04-14 | Disposition: A | Discharge status: Home / Self Care | Payer: Medicare Other | Source: Ambulatory Visit | Attending: Cardiovascular Disease | Admitting: Cardiovascular Disease

## 2015-04-14 DIAGNOSIS — Z951 Presence of aortocoronary bypass graft: Secondary | ICD-10-CM | POA: Diagnosis not present

## 2015-04-14 DIAGNOSIS — I2510 Atherosclerotic heart disease of native coronary artery without angina pectoris: Secondary | ICD-10-CM | POA: Diagnosis not present

## 2015-04-17 ENCOUNTER — Encounter (HOSPITAL_COMMUNITY)
Admission: RE | Admit: 2015-04-17 | Discharge: 2015-04-17 | Disposition: A | Discharge status: Home / Self Care | Payer: Medicare Other | Source: Ambulatory Visit | Attending: Cardiovascular Disease | Admitting: Cardiovascular Disease

## 2015-04-17 DIAGNOSIS — I2510 Atherosclerotic heart disease of native coronary artery without angina pectoris: Secondary | ICD-10-CM | POA: Diagnosis not present

## 2015-04-17 DIAGNOSIS — Z951 Presence of aortocoronary bypass graft: Secondary | ICD-10-CM | POA: Diagnosis not present

## 2015-04-19 ENCOUNTER — Encounter (HOSPITAL_COMMUNITY)
Admission: RE | Admit: 2015-04-19 | Discharge: 2015-04-19 | Disposition: A | Discharge status: Home / Self Care | Payer: Medicare Other | Source: Ambulatory Visit | Attending: Cardiovascular Disease | Admitting: Cardiovascular Disease

## 2015-04-19 DIAGNOSIS — Z951 Presence of aortocoronary bypass graft: Secondary | ICD-10-CM | POA: Diagnosis not present

## 2015-04-19 DIAGNOSIS — I2510 Atherosclerotic heart disease of native coronary artery without angina pectoris: Secondary | ICD-10-CM | POA: Diagnosis not present

## 2015-04-21 ENCOUNTER — Encounter (HOSPITAL_COMMUNITY)
Admission: RE | Admit: 2015-04-21 | Discharge: 2015-04-21 | Disposition: A | Discharge status: Home / Self Care | Payer: Medicare Other | Source: Ambulatory Visit | Attending: Cardiovascular Disease | Admitting: Cardiovascular Disease

## 2015-04-21 DIAGNOSIS — I2510 Atherosclerotic heart disease of native coronary artery without angina pectoris: Secondary | ICD-10-CM | POA: Insufficient documentation

## 2015-04-21 DIAGNOSIS — Z951 Presence of aortocoronary bypass graft: Secondary | ICD-10-CM | POA: Diagnosis not present

## 2015-04-21 DIAGNOSIS — I25759 Atherosclerosis of native coronary artery of transplanted heart with unspecified angina pectoris: Secondary | ICD-10-CM | POA: Diagnosis present

## 2015-04-24 ENCOUNTER — Encounter (HOSPITAL_COMMUNITY): Payer: Medicare Other

## 2015-04-26 ENCOUNTER — Encounter (HOSPITAL_COMMUNITY)
Admission: RE | Admit: 2015-04-26 | Discharge: 2015-04-26 | Disposition: A | Discharge status: Home / Self Care | Payer: Medicare Other | Source: Ambulatory Visit | Attending: Cardiovascular Disease | Admitting: Cardiovascular Disease

## 2015-04-26 DIAGNOSIS — Z951 Presence of aortocoronary bypass graft: Secondary | ICD-10-CM | POA: Diagnosis not present

## 2015-04-26 DIAGNOSIS — I2510 Atherosclerotic heart disease of native coronary artery without angina pectoris: Secondary | ICD-10-CM | POA: Diagnosis not present

## 2015-04-28 ENCOUNTER — Encounter (HOSPITAL_COMMUNITY)
Admission: RE | Admit: 2015-04-28 | Discharge: 2015-04-28 | Disposition: A | Discharge status: Home / Self Care | Payer: Medicare Other | Source: Ambulatory Visit | Attending: Cardiovascular Disease | Admitting: Cardiovascular Disease

## 2015-04-28 DIAGNOSIS — Z951 Presence of aortocoronary bypass graft: Secondary | ICD-10-CM | POA: Diagnosis not present

## 2015-04-28 DIAGNOSIS — I2510 Atherosclerotic heart disease of native coronary artery without angina pectoris: Secondary | ICD-10-CM | POA: Diagnosis not present

## 2015-05-01 ENCOUNTER — Encounter (HOSPITAL_COMMUNITY)
Admission: RE | Admit: 2015-05-01 | Discharge: 2015-05-01 | Disposition: A | Discharge status: Home / Self Care | Payer: Medicare Other | Source: Ambulatory Visit | Attending: Cardiovascular Disease | Admitting: Cardiovascular Disease

## 2015-05-01 DIAGNOSIS — Z951 Presence of aortocoronary bypass graft: Secondary | ICD-10-CM | POA: Diagnosis not present

## 2015-05-01 DIAGNOSIS — I2510 Atherosclerotic heart disease of native coronary artery without angina pectoris: Secondary | ICD-10-CM | POA: Diagnosis not present

## 2015-05-03 ENCOUNTER — Encounter (HOSPITAL_COMMUNITY)
Admission: RE | Admit: 2015-05-03 | Discharge: 2015-05-03 | Disposition: A | Discharge status: Home / Self Care | Payer: Medicare Other | Source: Ambulatory Visit | Attending: Cardiovascular Disease | Admitting: Cardiovascular Disease

## 2015-05-03 DIAGNOSIS — Z951 Presence of aortocoronary bypass graft: Secondary | ICD-10-CM | POA: Diagnosis not present

## 2015-05-03 DIAGNOSIS — I2510 Atherosclerotic heart disease of native coronary artery without angina pectoris: Secondary | ICD-10-CM | POA: Diagnosis not present

## 2015-05-03 NOTE — Progress Notes (Signed)
Cardiac Rehabilitation Program Outcomes Report   Orientation:  03/21/15 Graduate Date:  tbd Discharge Date:  tbd # of sessions completed: 18  Cardiologist: Margaretmary Lombardkoneswaran Family MD:  Betsy PriesGolding  Class Time:  0930  A.  Exercise Program:  Tolerates exercise at 3.36 mets for 15 minutes.   B.  Mental Health:  Good mental attitude  C.  Education/Instruction/Skills  Accurately checks own pulse.  Rest:  72  Exercise:  86 and Knows THR for exercise  Uses Perceived Exertion Scale and/or Dyspnea Scale  D.  Nutrition/Weight Control/Body Composition:  Adherence to prescribed nutrition program: good    E.  Blood Lipids   No results found for: CHOL, HDL, LDLCALC, LDLDIRECT, TRIG, CHOLHDL  F.  Lifestyle Changes:  Making positive lifestyle changes  G.  Symptoms noted with exercise:  Asymptomatic  Report Completed By:  Doretha Sou Meghan Warshawsky RN   Comments:  This is patients half way progress note for [facility].

## 2015-05-05 ENCOUNTER — Encounter (HOSPITAL_COMMUNITY)
Admission: RE | Admit: 2015-05-05 | Discharge: 2015-05-05 | Disposition: A | Discharge status: Home / Self Care | Payer: Medicare Other | Source: Ambulatory Visit | Attending: Cardiovascular Disease | Admitting: Cardiovascular Disease

## 2015-05-05 DIAGNOSIS — Z951 Presence of aortocoronary bypass graft: Secondary | ICD-10-CM | POA: Diagnosis not present

## 2015-05-05 DIAGNOSIS — I2510 Atherosclerotic heart disease of native coronary artery without angina pectoris: Secondary | ICD-10-CM | POA: Diagnosis not present

## 2015-05-08 ENCOUNTER — Encounter (HOSPITAL_COMMUNITY)
Admission: RE | Admit: 2015-05-08 | Discharge: 2015-05-08 | Disposition: A | Discharge status: Home / Self Care | Payer: Medicare Other | Source: Ambulatory Visit | Attending: Cardiovascular Disease | Admitting: Cardiovascular Disease

## 2015-05-08 DIAGNOSIS — I2510 Atherosclerotic heart disease of native coronary artery without angina pectoris: Secondary | ICD-10-CM | POA: Diagnosis not present

## 2015-05-08 DIAGNOSIS — Z951 Presence of aortocoronary bypass graft: Secondary | ICD-10-CM | POA: Diagnosis not present

## 2015-05-10 ENCOUNTER — Encounter (HOSPITAL_COMMUNITY)
Admission: RE | Admit: 2015-05-10 | Discharge: 2015-05-10 | Disposition: A | Discharge status: Home / Self Care | Payer: Medicare Other | Source: Ambulatory Visit | Attending: Cardiovascular Disease | Admitting: Cardiovascular Disease

## 2015-05-10 DIAGNOSIS — I2510 Atherosclerotic heart disease of native coronary artery without angina pectoris: Secondary | ICD-10-CM | POA: Diagnosis not present

## 2015-05-10 DIAGNOSIS — Z951 Presence of aortocoronary bypass graft: Secondary | ICD-10-CM | POA: Diagnosis not present

## 2015-05-12 ENCOUNTER — Encounter (HOSPITAL_COMMUNITY)
Admission: RE | Admit: 2015-05-12 | Discharge: 2015-05-12 | Disposition: A | Discharge status: Home / Self Care | Payer: Medicare Other | Source: Ambulatory Visit | Attending: Cardiovascular Disease | Admitting: Cardiovascular Disease

## 2015-05-12 DIAGNOSIS — Z951 Presence of aortocoronary bypass graft: Secondary | ICD-10-CM | POA: Diagnosis not present

## 2015-05-12 DIAGNOSIS — I2510 Atherosclerotic heart disease of native coronary artery without angina pectoris: Secondary | ICD-10-CM | POA: Diagnosis not present

## 2015-05-15 ENCOUNTER — Encounter (HOSPITAL_COMMUNITY)
Admission: RE | Admit: 2015-05-15 | Discharge: 2015-05-15 | Disposition: A | Discharge status: Home / Self Care | Payer: Medicare Other | Source: Ambulatory Visit | Attending: Cardiovascular Disease | Admitting: Cardiovascular Disease

## 2015-05-15 DIAGNOSIS — Z951 Presence of aortocoronary bypass graft: Secondary | ICD-10-CM | POA: Diagnosis not present

## 2015-05-15 DIAGNOSIS — I2510 Atherosclerotic heart disease of native coronary artery without angina pectoris: Secondary | ICD-10-CM | POA: Diagnosis not present

## 2015-05-17 ENCOUNTER — Encounter (HOSPITAL_COMMUNITY)
Admission: RE | Admit: 2015-05-17 | Discharge: 2015-05-17 | Disposition: A | Discharge status: Home / Self Care | Payer: Medicare Other | Source: Ambulatory Visit | Attending: Cardiovascular Disease | Admitting: Cardiovascular Disease

## 2015-05-17 DIAGNOSIS — Z951 Presence of aortocoronary bypass graft: Secondary | ICD-10-CM | POA: Diagnosis not present

## 2015-05-17 DIAGNOSIS — I2510 Atherosclerotic heart disease of native coronary artery without angina pectoris: Secondary | ICD-10-CM | POA: Diagnosis not present

## 2015-05-19 ENCOUNTER — Encounter (HOSPITAL_COMMUNITY)
Admission: RE | Admit: 2015-05-19 | Discharge: 2015-05-19 | Disposition: A | Discharge status: Home / Self Care | Payer: Medicare Other | Source: Ambulatory Visit | Attending: Cardiovascular Disease | Admitting: Cardiovascular Disease

## 2015-05-19 DIAGNOSIS — Z6820 Body mass index (BMI) 20.0-20.9, adult: Secondary | ICD-10-CM | POA: Diagnosis not present

## 2015-05-19 DIAGNOSIS — Z1389 Encounter for screening for other disorder: Secondary | ICD-10-CM | POA: Diagnosis not present

## 2015-05-19 DIAGNOSIS — G894 Chronic pain syndrome: Secondary | ICD-10-CM | POA: Diagnosis not present

## 2015-05-19 DIAGNOSIS — Z951 Presence of aortocoronary bypass graft: Secondary | ICD-10-CM | POA: Diagnosis not present

## 2015-05-19 DIAGNOSIS — I2510 Atherosclerotic heart disease of native coronary artery without angina pectoris: Secondary | ICD-10-CM | POA: Diagnosis not present

## 2015-05-19 DIAGNOSIS — F419 Anxiety disorder, unspecified: Secondary | ICD-10-CM | POA: Diagnosis not present

## 2015-05-22 ENCOUNTER — Encounter (HOSPITAL_COMMUNITY)
Admission: RE | Admit: 2015-05-22 | Discharge: 2015-05-22 | Disposition: A | Discharge status: Home / Self Care | Payer: Medicare Other | Source: Ambulatory Visit | Attending: Cardiovascular Disease | Admitting: Cardiovascular Disease

## 2015-05-22 DIAGNOSIS — I2510 Atherosclerotic heart disease of native coronary artery without angina pectoris: Secondary | ICD-10-CM | POA: Insufficient documentation

## 2015-05-22 DIAGNOSIS — I25759 Atherosclerosis of native coronary artery of transplanted heart with unspecified angina pectoris: Secondary | ICD-10-CM | POA: Diagnosis present

## 2015-05-22 DIAGNOSIS — Z951 Presence of aortocoronary bypass graft: Secondary | ICD-10-CM | POA: Insufficient documentation

## 2015-05-24 ENCOUNTER — Encounter (HOSPITAL_COMMUNITY)
Admission: RE | Admit: 2015-05-24 | Discharge: 2015-05-24 | Disposition: A | Discharge status: Home / Self Care | Payer: Medicare Other | Source: Ambulatory Visit | Attending: Cardiovascular Disease | Admitting: Cardiovascular Disease

## 2015-05-24 DIAGNOSIS — I2510 Atherosclerotic heart disease of native coronary artery without angina pectoris: Secondary | ICD-10-CM | POA: Diagnosis not present

## 2015-05-24 DIAGNOSIS — Z951 Presence of aortocoronary bypass graft: Secondary | ICD-10-CM | POA: Diagnosis not present

## 2015-05-26 ENCOUNTER — Encounter (HOSPITAL_COMMUNITY)
Admission: RE | Admit: 2015-05-26 | Discharge: 2015-05-26 | Disposition: A | Discharge status: Home / Self Care | Payer: Medicare Other | Source: Ambulatory Visit | Attending: Cardiovascular Disease | Admitting: Cardiovascular Disease

## 2015-05-26 DIAGNOSIS — I2510 Atherosclerotic heart disease of native coronary artery without angina pectoris: Secondary | ICD-10-CM | POA: Diagnosis not present

## 2015-05-26 DIAGNOSIS — Z951 Presence of aortocoronary bypass graft: Secondary | ICD-10-CM | POA: Diagnosis not present

## 2015-05-29 ENCOUNTER — Encounter (HOSPITAL_COMMUNITY)
Admission: RE | Admit: 2015-05-29 | Discharge: 2015-05-29 | Disposition: A | Discharge status: Home / Self Care | Payer: Medicare Other | Source: Ambulatory Visit | Attending: Cardiovascular Disease | Admitting: Cardiovascular Disease

## 2015-05-29 DIAGNOSIS — I2510 Atherosclerotic heart disease of native coronary artery without angina pectoris: Secondary | ICD-10-CM | POA: Diagnosis not present

## 2015-05-29 DIAGNOSIS — Z951 Presence of aortocoronary bypass graft: Secondary | ICD-10-CM | POA: Diagnosis not present

## 2015-05-31 ENCOUNTER — Encounter (HOSPITAL_COMMUNITY)
Admission: RE | Admit: 2015-05-31 | Discharge: 2015-05-31 | Disposition: A | Discharge status: Home / Self Care | Payer: Medicare Other | Source: Ambulatory Visit | Attending: Cardiovascular Disease | Admitting: Cardiovascular Disease

## 2015-05-31 DIAGNOSIS — I2510 Atherosclerotic heart disease of native coronary artery without angina pectoris: Secondary | ICD-10-CM | POA: Diagnosis not present

## 2015-05-31 DIAGNOSIS — Z951 Presence of aortocoronary bypass graft: Secondary | ICD-10-CM | POA: Diagnosis not present

## 2015-06-02 ENCOUNTER — Encounter (HOSPITAL_COMMUNITY)
Admission: RE | Admit: 2015-06-02 | Discharge: 2015-06-02 | Disposition: A | Discharge status: Home / Self Care | Payer: Medicare Other | Source: Ambulatory Visit | Attending: Cardiovascular Disease | Admitting: Cardiovascular Disease

## 2015-06-02 DIAGNOSIS — Z951 Presence of aortocoronary bypass graft: Secondary | ICD-10-CM | POA: Diagnosis not present

## 2015-06-02 DIAGNOSIS — I2510 Atherosclerotic heart disease of native coronary artery without angina pectoris: Secondary | ICD-10-CM | POA: Diagnosis not present

## 2015-06-05 ENCOUNTER — Encounter (HOSPITAL_COMMUNITY)
Admission: RE | Admit: 2015-06-05 | Discharge: 2015-06-05 | Disposition: A | Discharge status: Home / Self Care | Payer: Medicare Other | Source: Ambulatory Visit | Attending: Cardiovascular Disease | Admitting: Cardiovascular Disease

## 2015-06-05 DIAGNOSIS — Z951 Presence of aortocoronary bypass graft: Secondary | ICD-10-CM | POA: Diagnosis not present

## 2015-06-05 DIAGNOSIS — I2510 Atherosclerotic heart disease of native coronary artery without angina pectoris: Secondary | ICD-10-CM | POA: Diagnosis not present

## 2015-06-07 ENCOUNTER — Encounter (HOSPITAL_COMMUNITY)
Admission: RE | Admit: 2015-06-07 | Discharge: 2015-06-07 | Disposition: A | Discharge status: Home / Self Care | Payer: Medicare Other | Source: Ambulatory Visit | Attending: Cardiovascular Disease | Admitting: Cardiovascular Disease

## 2015-06-07 DIAGNOSIS — Z951 Presence of aortocoronary bypass graft: Secondary | ICD-10-CM | POA: Diagnosis not present

## 2015-06-07 DIAGNOSIS — I2510 Atherosclerotic heart disease of native coronary artery without angina pectoris: Secondary | ICD-10-CM | POA: Diagnosis not present

## 2015-06-09 ENCOUNTER — Encounter (HOSPITAL_COMMUNITY)
Admission: RE | Admit: 2015-06-09 | Discharge: 2015-06-09 | Disposition: A | Discharge status: Home / Self Care | Payer: Medicare Other | Source: Ambulatory Visit | Attending: Cardiovascular Disease | Admitting: Cardiovascular Disease

## 2015-06-09 DIAGNOSIS — Z951 Presence of aortocoronary bypass graft: Secondary | ICD-10-CM | POA: Diagnosis not present

## 2015-06-09 DIAGNOSIS — I2510 Atherosclerotic heart disease of native coronary artery without angina pectoris: Secondary | ICD-10-CM | POA: Diagnosis not present

## 2015-06-12 ENCOUNTER — Encounter (HOSPITAL_COMMUNITY)
Admission: RE | Admit: 2015-06-12 | Discharge: 2015-06-12 | Disposition: A | Discharge status: Home / Self Care | Payer: Medicare Other | Source: Ambulatory Visit | Attending: Cardiovascular Disease | Admitting: Cardiovascular Disease

## 2015-06-12 DIAGNOSIS — I2510 Atherosclerotic heart disease of native coronary artery without angina pectoris: Secondary | ICD-10-CM | POA: Diagnosis not present

## 2015-06-12 DIAGNOSIS — Z951 Presence of aortocoronary bypass graft: Secondary | ICD-10-CM | POA: Diagnosis not present

## 2015-06-13 ENCOUNTER — Ambulatory Visit (INDEPENDENT_AMBULATORY_CARE_PROVIDER_SITE_OTHER): Payer: Medicare Other | Admitting: Cardiovascular Disease

## 2015-06-13 VITALS — BP 130/78 | HR 71 | Ht 66.0 in | Wt 132.4 lb

## 2015-06-13 DIAGNOSIS — I25810 Atherosclerosis of coronary artery bypass graft(s) without angina pectoris: Secondary | ICD-10-CM | POA: Diagnosis not present

## 2015-06-13 DIAGNOSIS — I714 Abdominal aortic aneurysm, without rupture, unspecified: Secondary | ICD-10-CM

## 2015-06-13 DIAGNOSIS — I10 Essential (primary) hypertension: Secondary | ICD-10-CM

## 2015-06-13 DIAGNOSIS — I255 Ischemic cardiomyopathy: Secondary | ICD-10-CM

## 2015-06-13 DIAGNOSIS — Z9889 Other specified postprocedural states: Secondary | ICD-10-CM

## 2015-06-13 DIAGNOSIS — Z951 Presence of aortocoronary bypass graft: Secondary | ICD-10-CM

## 2015-06-13 DIAGNOSIS — E785 Hyperlipidemia, unspecified: Secondary | ICD-10-CM

## 2015-06-13 MED ORDER — LISINOPRIL 5 MG PO TABS: 5.0000 mg | ORAL_TABLET | Freq: Every day | ORAL | Status: DC

## 2015-06-13 NOTE — Progress Notes (Signed)
Patient ID: LENNEX PIETILA, male   DOB: 12-May-1951, 64 y.o.   MRN: [id number]      SUBJECTIVE: The patient presents for routine follow up. He has a past medical history significant for multivessel coronary artery disease who recently underwent coronary artery bypass grafting 2 with a reverse saphenous vein graft to the RCA and a LIMA to the LAD. He also underwent tricuspid valve repair using a 26 mm [employer] M3 annuloplasty ring.   Additional past medical history includes abdominal aortic aneurysm followed by vascular surgery, hypertension, hyperlipidemia, and COPD.   Bypass surgery was performed on 02/23/15. Echocardiogram on 02/22/15 reportedly demonstrated mildly reduced left ventricular systolic function, EF 40%, mild mitral regurgitation, aortic valve sclerosis, and moderate to severe tricuspid valve regurgitation. The operation was performed at [facility] in [place], [place].    Coronary angiography on 02/17/15 demonstrated the left main coronary artery to have mild 30-40% stenosis, 100% LAD stenosis with right to left collaterals and faint left to left collaterals, small nondominant left circumflex, small to medium caliber first obtuse marginal branch with 20% ostial and proximal stenosis. The RCA was noted to have diffuse disease.  He has been participating in cardiac rehabilitation and graduates tomorrow.  He denies chest pain, palpitations, leg swelling, lightheadedness, and shortness of breath. 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     Review of Systems: As per "subjective", otherwise negative.  No Known Allergies  Current Outpatient Prescriptions  Medication Sig Dispense Refill  . ALPRAZolam (XANAX) 1 MG tablet Take 1 mg by mouth 3 (three) times daily as needed for anxiety.    Marland Kitchen amiodarone (PACERONE) 200 MG tablet Take 1 tablet (200 mg total) by mouth 2 (two) times daily. 60 tablet 3  . aspirin EC 81 MG  tablet Take 81 mg by mouth daily.    Marland Kitchen atorvastatin (LIPITOR) 40 MG tablet Take 40 mg by mouth daily at 6 PM.   3  . carvedilol (COREG) 3.125 MG tablet Take 3.125 mg by mouth daily with lunch.   3  . clopidogrel (PLAVIX) 75 MG tablet Take 75 mg by mouth daily.   3  . docusate sodium (COLACE) 100 MG capsule Take 100 mg by mouth daily.    . furosemide (LASIX) 20 MG tablet Take 20 mg by mouth 2 (two) times daily.   0  . HYDROcodone-acetaminophen (NORCO) 7.5-325 MG per tablet Take 1 tablet by mouth 3 (three) times daily as needed for moderate pain.    . Multiple Vitamins-Minerals (MULTIVITAMIN WITH MINERALS) tablet Take 1 tablet by mouth daily. Patient chews 2 gummies each day    . potassium chloride SA (K-DUR,KLOR-CON) 20 MEQ tablet Take 1 tablet (20 mEq total) by mouth daily. 30 tablet 6  . tiotropium (SPIRIVA) 18 MCG inhalation capsule Place 18 mcg into inhaler and inhale daily.     No current facility-administered medications for this visit.    Past Medical History  Diagnosis Date  . Anxiety   . Arthritis   . Pulmonary nodule     benign  . COPD (chronic obstructive pulmonary disease)   . AAA (abdominal aortic aneurysm)     Past Surgical History  Procedure Laterality Date  . Inguinal hernia repair Bilateral     Social History   Social History  . Marital Status: Divorced    Spouse Name: N/A  . Number of Children: N/A  . Years of Education: N/A  Occupational History  . Not on file.   Social History Main Topics  . Smoking status: Former Smoker    Quit date: 02/11/2015  . Smokeless tobacco: Never Used  . Alcohol Use: No  . Drug Use: No  . Sexual Activity: Not on file   Other Topics Concern  . Not on file   Social History Narrative     Filed Vitals:   06/13/15 0811  BP: 130/78  Pulse: 71  Height:  (1.676 m)  Weight: 132 lb 6.4 oz (60.056 kg)  SpO2: 99%    PHYSICAL EXAM General: NAD HEENT: Normal. Neck: No JVD, no thyromegaly. Lungs: Clear to  auscultation bilaterally with normal respiratory effort. CV: Nondisplaced PMI.  Regular rate and rhythm, normal S1/S2, no S3/S4, no murmur. No pretibial or periankle edema.  No carotid bruit.  Normal pedal pulses.  Abdomen: Soft, nontender, no hepatosplenomegaly, no distention.  Neurologic: Alert and oriented x 3.  Psych: Normal affect. Skin: Normal. Musculoskeletal: Normal range of motion, no gross deformities. Extremities: No clubbing or cyanosis.   ECG: Most recent ECG reviewed.      ASSESSMENT AND PLAN: 1. CAD s/p 2-vessel CABG, EF 40%: Symptomatically stable. Continue aspirin 81 mg, Plavix, carvedilol, and Lipitor. I suspect he had some postoperative atrial fibrillation and for this reason was placed on amiodarone. He is currently in a regular rhythm. I will discontinue amiodarone today. Renal function was normal on 5/23 (BUN 11, creatinine 0.97). I will add lisinopril 5 mg daily given his ischemic cardiomyopathy.  Will also provide return to work letter.  2. Essential HTN: Well controlled at present. Monitor given addition of lisinopril 5 mg..  3. Hyperlipidemia: On 5/23, TC 164, TG 132, LDL 78, HDL 62.  Continue Lipitor 40 mg.  4. Tricuspid regurgitation s/p annuloplasty: No murmurs. Stable.  Dispo: f/u 6 months.   Prentice Docker, M.D., F.A.C.C.

## 2015-06-13 NOTE — Patient Instructions (Signed)
Your physician wants you to follow-up in: 6 months with Dr. Purvis Sheffield. You will receive a reminder letter in the mail two months in advance. If you don't receive a letter, please call our office to schedule the follow-up appointment.  DISCONTINUE AMIODARONE   START LISINOPRIL 5 MG DAILY  I GAVE YOU A LETTER TO RETURN TO WORK  Thanks for choosing [facility]!!!

## 2015-06-14 ENCOUNTER — Encounter (HOSPITAL_COMMUNITY)
Admission: RE | Admit: 2015-06-14 | Discharge: 2015-06-14 | Disposition: A | Discharge status: Home / Self Care | Payer: Medicare Other | Source: Ambulatory Visit | Attending: Cardiovascular Disease | Admitting: Cardiovascular Disease

## 2015-06-14 DIAGNOSIS — Z951 Presence of aortocoronary bypass graft: Secondary | ICD-10-CM | POA: Diagnosis not present

## 2015-06-14 DIAGNOSIS — I2510 Atherosclerotic heart disease of native coronary artery without angina pectoris: Secondary | ICD-10-CM | POA: Diagnosis not present

## 2015-06-19 NOTE — Progress Notes (Signed)
Patient is discharged from [place] and Pulmonary program today, June 14, 2015 with 36 sessions.  He achieved LTG of 30 minutes of aerobic exercise at max met level of 3.36.  All patient vitals are WNL.  Patient has not met with dietician.  Discharge instructions have been reviewed in detail and patient expressed an understanding of material given.  Patient plans to exercise at home Cardiac Rehab will make 1 month, 6 month and 1 year call backs.  Patient had no complaints of any abnormal S/S or pain on their exit visit.  Patient finished post walk test.

## 2015-06-19 NOTE — Progress Notes (Signed)
Cardiac Rehabilitation Program Outcomes Report   Orientation:  03/21/15 Graduate Date:  06/14/15 Discharge Date:  06/14/15 # of sessions completed: 36  Cardiologist: Purvis Sheffield Family MD:  Betsy Pries Time:  0930  A.  Exercise Program:  Tolerates exercise @ 3.36 METS for 15 minutes, Walk Test Results:  Post: 2.96 mets, Improved functional capacity  2.4 %, Decreased  muscular strength  1.99 % and Improved  flexibility 2.5 %  B.  Mental Health:  Good mental attitude and Quality of Life (QOL)  improvements:  Overall  1.16 %, Health/Functioning 2.74 %, Socioeconomics no change %, Psych/Spiritual no change %, Family no change %    C.  Education/Instruction/Skills  Accurately checks own pulse.  Rest:  67  Exercise:  85, Knows THR for exercise, Uses Perceived Exertion Scale and/or Dyspnea Scale and Attended all education classes  Home exercise given: 06/14/15  D.  Nutrition/Weight Control/Body Composition:  Adherence to prescribed nutrition program: fair    E.  Blood Lipids   No results found for: CHOL, HDL, LDLCALC, LDLDIRECT, TRIG, CHOLHDL  F.  Lifestyle Changes:  Making positive lifestyle changes and Not smoking:  Quit 01/2015  G.  Symptoms noted with exercise:  Asymptomatic  Report Completed By:  Doretha Sou RN   Comments:  Patient has graduated [facility] today.  Patient done very well in program.

## 2015-07-11 DIAGNOSIS — Z6821 Body mass index (BMI) 21.0-21.9, adult: Secondary | ICD-10-CM | POA: Diagnosis not present

## 2015-07-11 DIAGNOSIS — Z1389 Encounter for screening for other disorder: Secondary | ICD-10-CM | POA: Diagnosis not present

## 2015-07-11 DIAGNOSIS — G894 Chronic pain syndrome: Secondary | ICD-10-CM | POA: Diagnosis not present

## 2015-07-11 DIAGNOSIS — F419 Anxiety disorder, unspecified: Secondary | ICD-10-CM | POA: Diagnosis not present

## 2015-09-18 IMAGING — US US CAROTID DUPLEX BILAT
1 series · 13 of 24 positions shown · non-contrast
Comparison: None.

CLINICAL DATA: Left neck bruit

EXAM:
BILATERAL CAROTID DUPLEX ULTRASOUND
TECHNIQUE: Gray scale imaging, color Doppler and duplex ultrasound were
performed of bilateral carotid and vertebral arteries in the neck.

[Series 1: us carotid duplex bilat · 0.06mm/px · 13 of 68 slices shown]
[im 1/68]
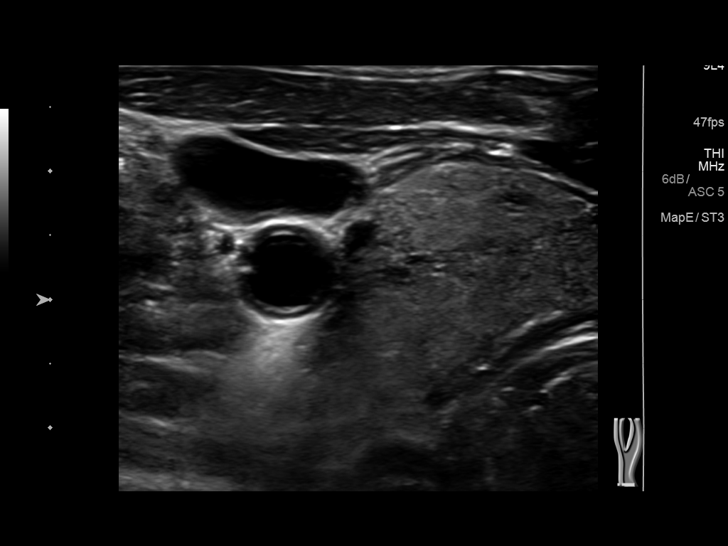
[im 6/68]
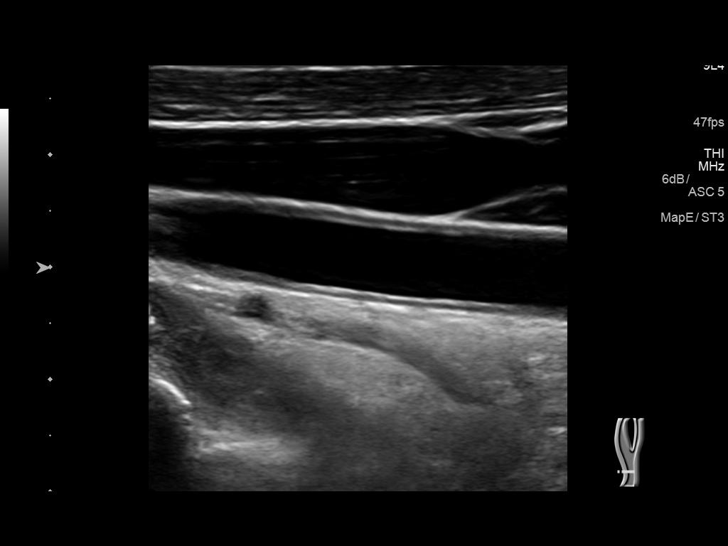
[im 12/68]
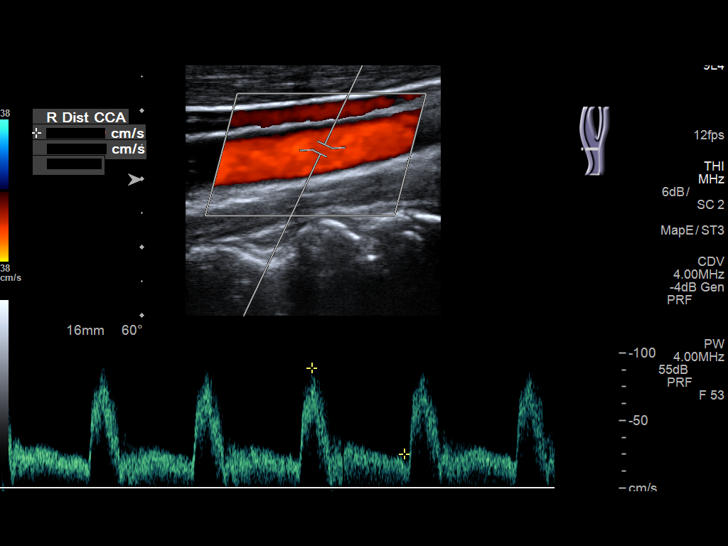
[im 18/68]
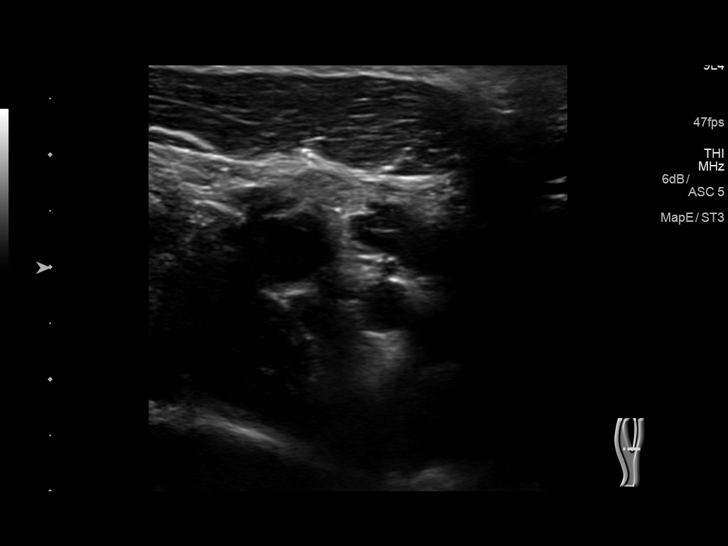
[im 24/68]
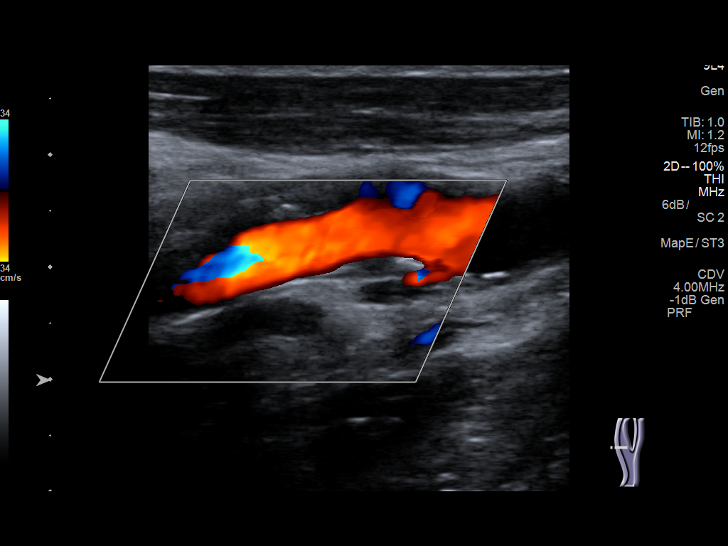
[im 30/68]
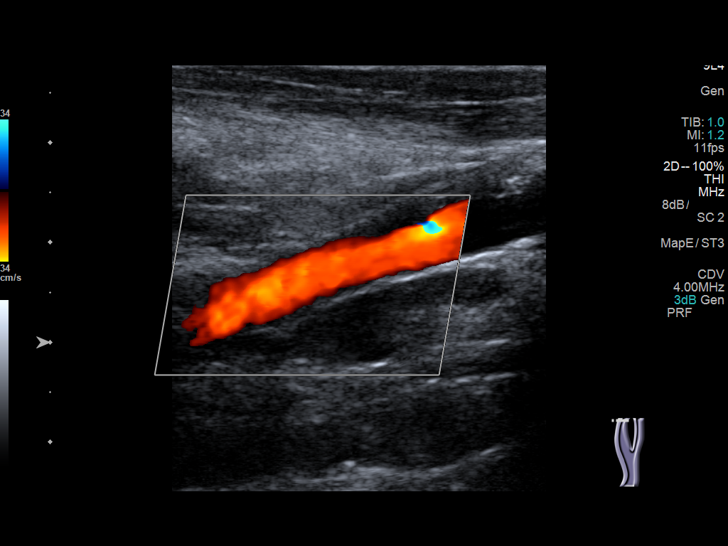
[im 35/68]
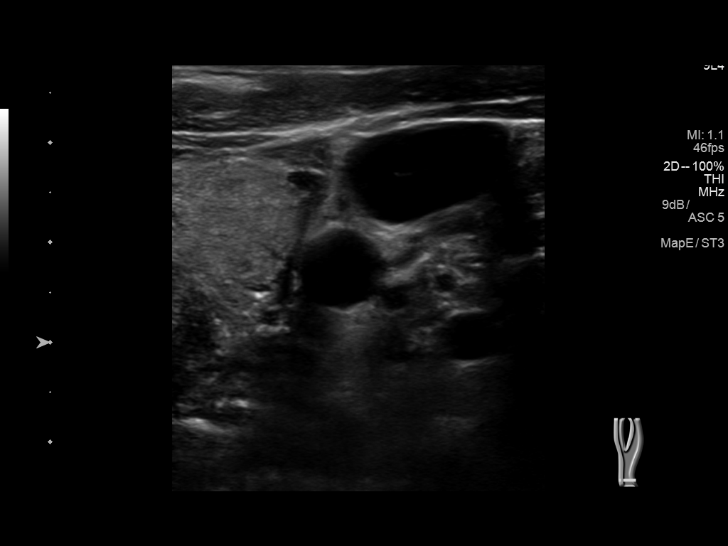
[im 38/68]
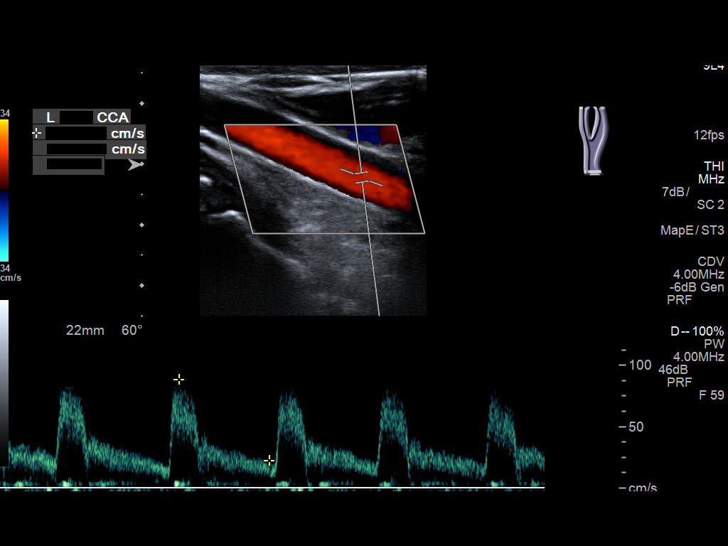
[im 44/68]
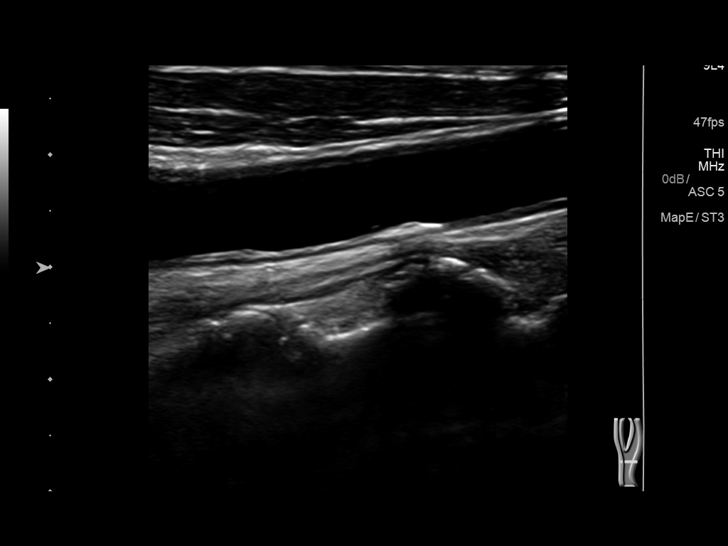
[im 50/68]
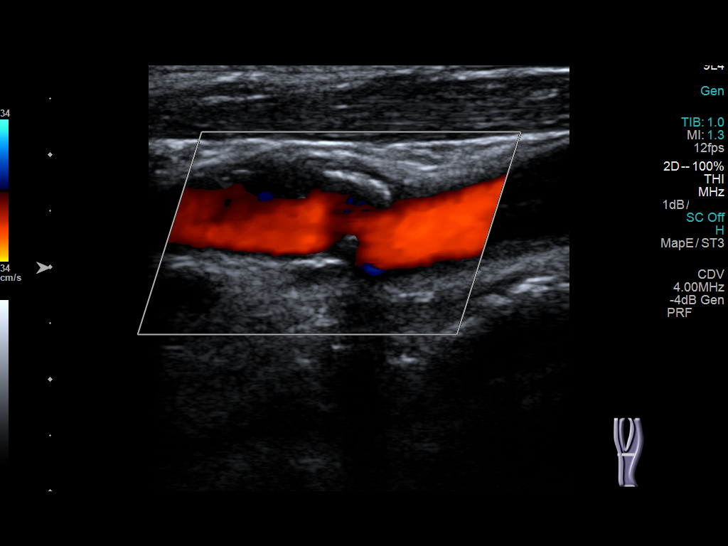
[im 56/68]
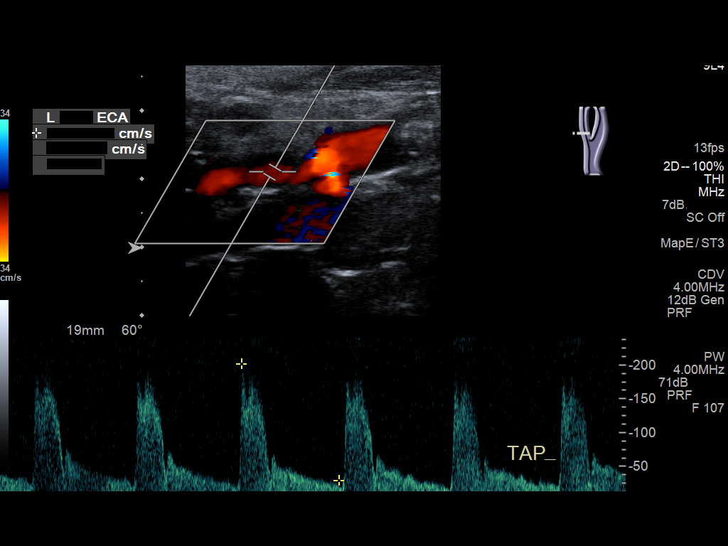
[im 62/68]
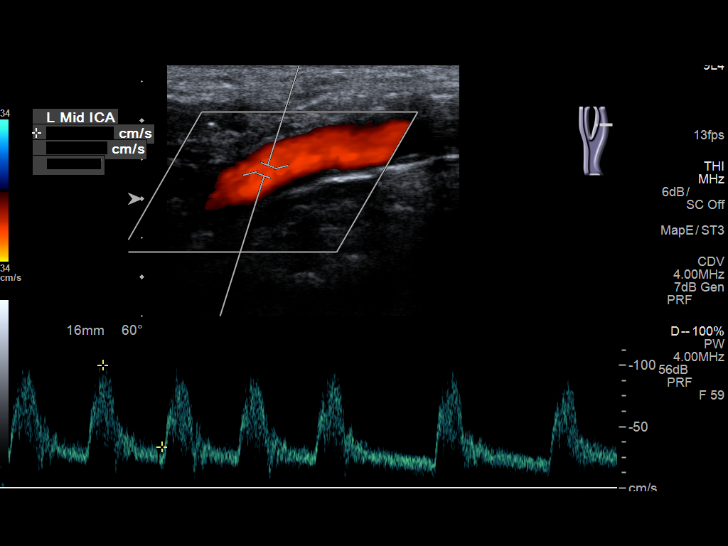
[im 68/68]
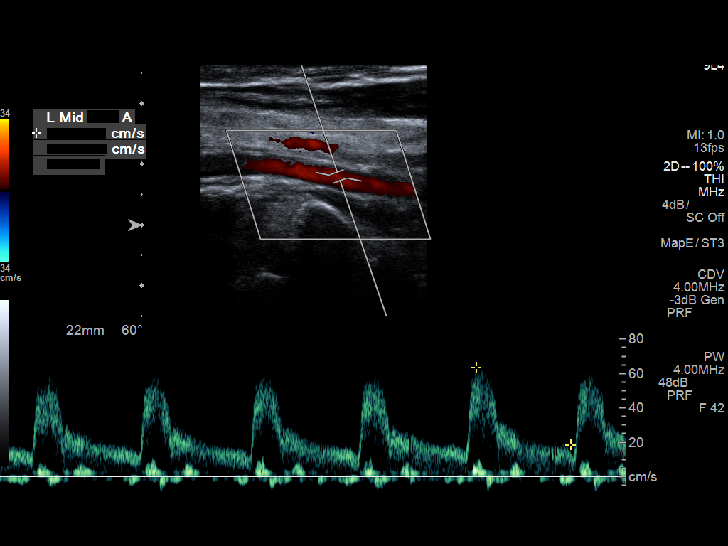

[13 of 24 positions shown; findings below may reference images not displayed]

FINDINGS: Criteria: Quantification of carotid stenosis is based on velocity
parameters that correlate the residual internal carotid diameter
with NASCET-based stenosis levels, using the diameter of the distal
internal carotid lumen as the denominator for stenosis measurement.

The following velocity measurements were obtained:

RIGHT

ICA:  103 cm/sec

CCA:  89 cm/sec

SYSTOLIC ICA/CCA RATIO:

DIASTOLIC ICA/CCA RATIO:

ECA:  96 cm/sec

LEFT

ICA:  102 cm/sec

CCA:  100 cm/sec

SYSTOLIC ICA/CCA RATIO:

DIASTOLIC ICA/CCA RATIO:

ECA:  202 cm/sec

RIGHT CAROTID ARTERY: Mild calcified plaque in the bulb. Low
resistance internal carotid Doppler pattern. There is some irregular
plaque at the origin of the right external carotid artery.

RIGHT VERTEBRAL ARTERY:  Antegrade.

LEFT CAROTID ARTERY: Mild calcified plaque in the bulb. There is
some irregular plaque at the origin of the external carotid artery.
There is an elevated velocity in the left external carotid artery.

LEFT VERTEBRAL ARTERY:  Antegrade.
IMPRESSION: Less than 50% stenosis in the right and left internal carotid
arteries. There is calcified plaque in the bilateral bulbs.

There is an elevated velocity in the left external carotid artery
likely the cause of the bruit.

## 2015-10-23 DIAGNOSIS — I2510 Atherosclerotic heart disease of native coronary artery without angina pectoris: Secondary | ICD-10-CM | POA: Diagnosis not present

## 2015-10-23 DIAGNOSIS — Z1389 Encounter for screening for other disorder: Secondary | ICD-10-CM | POA: Diagnosis not present

## 2015-10-23 DIAGNOSIS — Z6820 Body mass index (BMI) 20.0-20.9, adult: Secondary | ICD-10-CM | POA: Diagnosis not present

## 2015-10-23 DIAGNOSIS — G894 Chronic pain syndrome: Secondary | ICD-10-CM | POA: Diagnosis not present

## 2015-10-23 DIAGNOSIS — F419 Anxiety disorder, unspecified: Secondary | ICD-10-CM | POA: Diagnosis not present

## 2015-10-23 DIAGNOSIS — F17290 Nicotine dependence, other tobacco product, uncomplicated: Secondary | ICD-10-CM | POA: Diagnosis not present

## 2016-01-25 DIAGNOSIS — Z1389 Encounter for screening for other disorder: Secondary | ICD-10-CM | POA: Diagnosis not present

## 2016-01-25 DIAGNOSIS — Z0000 Encounter for general adult medical examination without abnormal findings: Secondary | ICD-10-CM | POA: Diagnosis not present

## 2016-01-25 DIAGNOSIS — Z6820 Body mass index (BMI) 20.0-20.9, adult: Secondary | ICD-10-CM | POA: Diagnosis not present

## 2016-03-28 ENCOUNTER — Encounter: Payer: Self-pay | Admitting: Family

## 2016-04-02 ENCOUNTER — Encounter: Payer: Self-pay | Admitting: Family

## 2016-04-02 ENCOUNTER — Ambulatory Visit (HOSPITAL_COMMUNITY)
Admission: RE | Admit: 2016-04-02 | Discharge: 2016-04-02 | Disposition: A | Discharge status: Home / Self Care | Payer: Medicare Other | Source: Ambulatory Visit | Attending: Vascular Surgery | Admitting: Vascular Surgery

## 2016-04-02 ENCOUNTER — Ambulatory Visit (INDEPENDENT_AMBULATORY_CARE_PROVIDER_SITE_OTHER): Payer: Medicare Other | Admitting: Family

## 2016-04-02 VITALS — BP 131/71 | HR 79 | Temp 97.9°F | Resp 16 | Ht 66.0 in | Wt 125.0 lb

## 2016-04-02 DIAGNOSIS — Z720 Tobacco use: Secondary | ICD-10-CM | POA: Diagnosis not present

## 2016-04-02 DIAGNOSIS — I714 Abdominal aortic aneurysm, without rupture, unspecified: Secondary | ICD-10-CM

## 2016-04-02 DIAGNOSIS — F419 Anxiety disorder, unspecified: Secondary | ICD-10-CM | POA: Diagnosis not present

## 2016-04-02 DIAGNOSIS — I708 Atherosclerosis of other arteries: Secondary | ICD-10-CM | POA: Diagnosis not present

## 2016-04-02 DIAGNOSIS — I723 Aneurysm of iliac artery: Secondary | ICD-10-CM

## 2016-04-02 DIAGNOSIS — F17200 Nicotine dependence, unspecified, uncomplicated: Secondary | ICD-10-CM

## 2016-04-02 NOTE — Progress Notes (Signed)
[facility] [place]   CC: Follow up Abdominal Aortic Aneurysm and right CIA aneurysm   History of Present Illness  [name] is a 65 y.o. (Feb 10, 1951) male patient of Dr. Arbie CookeyEarly who returns today for follow-up of small abdominal aortic aneurysm. 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 He is seen today for one-year ultrasound follow-up to rule out any enlargement. [facility] 2016. Had chest pain and was found to have three-vessel disease and underwent uneventful bypass. He is recovered quite nicely from this. He has no symptoms referable to his aneurysm and no symptoms of lower extremity arterial insufficiency  The patient denies history of stroke or TIA symptoms.  Pt Diabetic: No Pt smoker: smoker  (5-6 cigarettes/day, decreased from 2-3 ppd, started at age 89 yrs)   Past Medical History  Diagnosis Date  . Anxiety   . Arthritis   . Pulmonary nodule     benign  . COPD (chronic obstructive pulmonary disease) (HCC)   . AAA (abdominal aortic aneurysm) [facility](HCC)    Past Surgical History  Procedure Laterality Date  . Inguinal hernia repair Bilateral    Social History Social History   Social History  . Marital Status: Divorced    Spouse Name: N/A  . Number of Children: N/A  . Years of Education: N/A   Occupational History  . Not on file.   Social History Main Topics  . Smoking status: Former Smoker    Quit date: 02/11/2015  . Smokeless tobacco: Never Used  . Alcohol Use: No  . Drug Use: No  . Sexual Activity: Not on file   Other Topics Concern  . Not on file   Social History Narrative   Family History Family History  Problem Relation Age of Onset  . Heart disease Father     Current Outpatient Prescriptions on File Prior to Visit   Medication Sig Dispense Refill  . ALPRAZolam (XANAX) 1 MG tablet Take 1 mg by mouth 3 (three) times daily as needed for anxiety.    Marland Kitchen. aspirin EC 81 MG tablet Take 81 mg by mouth daily.    Marland Kitchen. atorvastatin (LIPITOR) 40 MG tablet Take 40 mg by mouth daily at 6 PM.   3  . carvedilol (COREG) 3.125 MG tablet Take 3.125 mg by mouth daily with lunch.   3  . clopidogrel (PLAVIX) 75 MG tablet Take 75 mg by mouth daily.   3  . docusate sodium (COLACE) 100 MG capsule Take 100 mg by mouth daily.    . furosemide (LASIX) 20 MG tablet Take 20 mg by mouth 2 (two) times daily.   0  . HYDROcodone-acetaminophen (NORCO) 7.5-325 MG per tablet Take 1 tablet by mouth 3 (three) times daily as needed for moderate pain.    Marland Kitchen. lisinopril (PRINIVIL,ZESTRIL) 5 MG tablet Take 1 tablet (5 mg total) by mouth daily. 90 tablet 3  . Multiple Vitamins-Minerals (MULTIVITAMIN WITH MINERALS) tablet Take 1 tablet by mouth daily. Patient chews 2 gummies each day    . potassium chloride SA (K-DUR,KLOR-CON) 20 MEQ tablet Take 1 tablet (20 mEq total) by mouth daily. 30 tablet 6  . tiotropium (SPIRIVA) 18 MCG inhalation capsule Place 18 mcg into inhaler and inhale daily.     No current facility-administered medications on  file prior to visit.   No Known Allergies  ROS: See HPI for pertinent positives and negatives.  Physical Examination  Filed Vitals:   04/02/16 0940  BP: 131/71  Pulse: 79  Temp: 97.9 F (36.6 C)  Resp: 16  Height: 5\' 6"  (1.676 m)  Weight: 125 lb (56.7 kg)  SpO2: 98%   Body mass index is 20.19 kg/(m^2).  General: A&O x 3, WD, male.  Pulmonary: Sym exp, respirations are non labored, good air movt, CTAB, no rales, rhonchi, or wheezing.  Cardiac: RRR, Nl S1, S2, no detected murmur.   Carotid Bruits Right Left   Negative Negative   Aorta is moderately palpable Radial pulses are 2+ palpable and =                          VASCULAR EXAM:                                                                                                          LE Pulses Right Left       FEMORAL   palpable   palpable        POPLITEAL   palpable   not palpable       POSTERIOR TIBIAL   palpable   not  palpable        DORSALIS PEDIS      ANTERIOR TIBIAL  palpable  not palpable      Gastrointestinal: soft, NTND, -G/R, - HSM, - masses palpated, - CVAT B.  Musculoskeletal: M/S 5/5 throughout , Extremities without ischemic changes.  Neurologic: CN 2-12 intact, Pain and light touch intact in extremities are intact except, Motor exam as listed above.   Non-Invasive Vascular Imaging  AAA Duplex (04/02/2016) ABDOMINAL AORTA DUPLEX EVALUATION    INDICATION: Abdominal aortic aneurysm    PREVIOUS INTERVENTION(S): None    DUPLEX EXAM: AAA duplex    LOCATION DIAMETER AP (cm) DIAMETER TRANSVERSE (cm) VELOCITIES (cm/sec)  Aorta Proximal 1.85 1.63 69  Aorta Mid 2.12 2.16 45  Aorta Distal 3.38 3.92 30  Right Common Iliac Artery 2.29 2.23 52  Left Common Iliac Artery 1.16 1.22 55/155    Previous max aortic diameter:  3.2 x 3.25 Date: 03-28-15     ADDITIONAL FINDINGS:     IMPRESSION: 1. Distal abdominal aortic aneurysm measuring 3.38 x3.92cm 2. Proximal right common iliac artery aneurysm measuring 2.29 x2.23 cm. 3. Left common iliac artery stenosis of  < 50% diameter reduction based on velocity ratio.    Compared to the previous exam:  Minimal increase in distal AAA and mild increase in right CIA aneurysms compared to previous exam of 03-28-15.     Medical Decision Making  The patient is a 65 y.o. male who presents with asymptomatic AAA with mild increase in size ( 3.38 x3.92cm), and mild increase in size of right CIA aneurysm (2.29 x2.23 cm). The patient was counseled re smoking cessation and given several free resources re smoking cessation.  His primary risk factor for AAA growth is his  59 years smoking history; fortunately his blood pressure is in good control.   Based on this patient's exam and  diagnostic studies, the patient will follow up in 6 months  with the following studies: AAA duplex.  Consideration for repair of AAA would be made when the size is 5.5 cm, growth > 1 cm/yr, and symptomatic status.  Consideration for repair of CIA aneurysm would be over 3 cm.  I emphasized the importance of maximal medical management including strict control of blood pressure, blood glucose, and lipid levels, antiplatelet agents, obtaining regular exercise, and cessation of smoking.   The patient was given information about AAA including signs, symptoms, treatment, and how to minimize the risk of enlargement and rupture of aneurysms.    The patient was advised to call 911 should the patient experience sudden onset abdominal or back pain.   Thank you for allowing [place] to participate in this patient's care.  Charisse March, RN, MSN, FNP-C [facility] [place] Office: [phone]  Clinic Physician: Early  04/02/2016, 10:04 AM

## 2016-04-02 NOTE — Patient Instructions (Addendum)
Abdominal Aortic Aneurysm An aneurysm is a weakened or damaged part of an artery wall that bulges from the normal force of blood pumping through the body. An abdominal aortic aneurysm is an aneurysm that occurs in the lower part of the aorta, the main artery of the body.  The major concern with an abdominal aortic aneurysm is that it can enlarge and burst (rupture) or blood can flow between the layers of the wall of the aorta through a tear (aorticdissection). Both of these conditions can cause bleeding inside the body and can be life threatening unless diagnosed and treated promptly. CAUSES  The exact cause of an abdominal aortic aneurysm is unknown. Some contributing factors are:   A hardening of the arteries caused by the buildup of fat and other substances in the lining of a blood vessel (arteriosclerosis).  Inflammation of the walls of an artery (arteritis).   Connective tissue diseases, such as Marfan syndrome.   Abdominal trauma.   An infection, such as syphilis or staphylococcus, in the wall of the aorta (infectious aortitis) caused by bacteria. RISK FACTORS  Risk factors that contribute to an abdominal aortic aneurysm may include:  Age older than 60 years.   High blood pressure (hypertension).  Male gender.  Ethnicity (white race).  Obesity.  Family history of aneurysm (first degree relatives only).  Tobacco use. PREVENTION  The following healthy lifestyle habits may help decrease your risk of abdominal aortic aneurysm:  Quitting smoking. Smoking can raise your blood pressure and cause arteriosclerosis.  Limiting or avoiding alcohol.  Keeping your blood pressure, blood sugar level, and cholesterol levels within normal limits.  Decreasing your salt intake. In somepeople, too much salt can raise blood pressure and increase your risk of abdominal aortic aneurysm.  Eating a diet low in saturated fats and cholesterol.  Increasing your fiber intake by including  whole grains, vegetables, and fruits in your diet. Eating these foods may help lower blood pressure.  Maintaining a healthy weight.  Staying physically active and exercising regularly. SYMPTOMS  The symptoms of abdominal aortic aneurysm may vary depending on the size and rate of growth of the aneurysm.Most grow slowly and do not have any symptoms. When symptoms do occur, they may include:  Pain (abdomen, side, lower back, or groin). The pain may vary in intensity. A sudden onset of severe pain may indicate that the aneurysm has ruptured.  Feeling full after eating only small amounts of food.  Nausea or vomiting or both.  Feeling a pulsating lump in the abdomen.  Feeling faint or passing out. DIAGNOSIS  Since most unruptured abdominal aortic aneurysms have no symptoms, they are often discovered during diagnostic exams for other conditions. An aneurysm may be found during the following procedures:  Ultrasonography (A one-time screening for abdominal aortic aneurysm by ultrasonography is also recommended for all men aged 65-75 years who have ever smoked).  X-ray exams.  A computed tomography (CT).  Magnetic resonance imaging (MRI).  Angiography or arteriography. TREATMENT  Treatment of an abdominal aortic aneurysm depends on the size of your aneurysm, your age, and risk factors for rupture. Medication to control blood pressure and pain may be used to manage aneurysms smaller than 6 cm. Regular monitoring for enlargement may be recommended by your caregiver if:  The aneurysm is 3-4 cm in size (an annual ultrasonography may be recommended).  The aneurysm is 4-4.5 cm in size (an ultrasonography every 6 months may be recommended).  The aneurysm is larger than 4.5 cm in   size (your caregiver may ask that you be examined by a vascular surgeon). If your aneurysm is larger than 6 cm, surgical repair may be recommended. There are two main methods for repair of an aneurysm:   Endovascular  repair (a minimally invasive surgery). This is done most often.  Open repair. This method is used if an endovascular repair is not possible.   This information is not intended to replace advice given to you by your health care provider. Make sure you discuss any questions you have with your health care provider.   Document Released: 07/17/2005 Document Revised: 02/01/2013 Document Reviewed: 11/06/2012 Elsevier Interactive Patient Education 2016 [employer] Inc[employer]     Steps to Quit Smoking  Smoking tobacco can be harmful to your health and can affect almost every organ in your body. Smoking puts you, and those around you, at risk for developing many serious chronic diseases. Quitting smoking is difficult, but it is one of the best things that you can do for your health. It is never too late to quit. WHAT ARE THE BENEFITS OF QUITTING SMOKING? When you quit smoking, you lower your risk of developing serious diseases and conditions, such as:  Lung cancer or lung disease, such as COPD.  Heart disease.  Stroke.  Heart attack.  Infertility.  Osteoporosis and bone fractures. Additionally, symptoms such as coughing, wheezing, and shortness of breath may get better when you quit. You may also find that you get sick less often because your body is stronger at fighting off colds and infections. If you are pregnant, quitting smoking can help to reduce your chances of having a baby of low birth weight. HOW DO I GET READY TO QUIT? When you decide to quit smoking, create a plan to make sure that you are successful. Before you quit:  Pick a date to quit. Set a date within the next two weeks to give you time to prepare.  Write down the reasons why you are quitting. Keep this list in places where you will see it often, such as on your bathroom mirror or in your car or wallet.  Identify the people, places, things, and activities that make you want to smoke (triggers) and avoid them. Make sure to take  these actions:  Throw away all cigarettes at home, at work, and in your car.  Throw away smoking accessories, such as ashtrays and lighters.  Clean your car and make sure to empty the ashtray.  Clean your home, including curtains and carpets.  Tell your family, friends, and coworkers that you are quitting. Support from your loved ones can make quitting easier.  Talk with your health care provider about your options for quitting smoking.  Find out what treatment options are covered by your health insurance. WHAT STRATEGIES CAN I USE TO QUIT SMOKING?  Talk with your healthcare provider about different strategies to quit smoking. Some strategies include:  Quitting smoking altogether instead of gradually lessening how much you smoke over a period of time. Research shows that quitting "cold turkey" is more successful than gradually quitting.  Attending in-person counseling to help you build problem-solving skills. You are more likely to have success in quitting if you attend several counseling sessions. Even short sessions of 10 minutes can be effective.  Finding resources and support systems that can help you to quit smoking and remain smoke-free after you quit. These resources are most helpful when you use them often. They can include:  Online chats with a counselor.  Telephone   quitlines.  Printed self-help materials.  Support groups or group counseling.  Text messaging programs.  Mobile phone applications.  Taking medicines to help you quit smoking. (If you are pregnant or breastfeeding, talk with your health care provider first.) Some medicines contain nicotine and some do not. Both types of medicines help with cravings, but the medicines that include nicotine help to relieve withdrawal symptoms. Your health care provider may recommend:  Nicotine patches, gum, or lozenges.  Nicotine inhalers or sprays.  Non-nicotine medicine that is taken by mouth. Talk with your health care  provider about combining strategies, such as taking medicines while you are also receiving in-person counseling. Using these two strategies together makes you more likely to succeed in quitting than if you used either strategy on its own. If you are pregnant or breastfeeding, talk with your health care provider about finding counseling or other support strategies to quit smoking. Do not take medicine to help you quit smoking unless told to do so by your health care provider. WHAT THINGS CAN I DO TO MAKE IT EASIER TO QUIT? Quitting smoking might feel overwhelming at first, but there is a lot that you can do to make it easier. Take these important actions:  Reach out to your family and friends and ask that they support and encourage you during this time. Call telephone quitlines, reach out to support groups, or work with a counselor for support.  Ask people who smoke to avoid smoking around you.  Avoid places that trigger you to smoke, such as bars, parties, or smoke-break areas at work.  Spend time around people who do not smoke.  Lessen stress in your life, because stress can be a smoking trigger for some people. To lessen stress, try:  Exercising regularly.  Deep-breathing exercises.  Yoga.  Meditating.  Performing a body scan. This involves closing your eyes, scanning your body from head to toe, and noticing which parts of your body are particularly tense. Purposefully relax the muscles in those areas.  Download or purchase mobile phone or tablet apps (applications) that can help you stick to your quit plan by providing reminders, tips, and encouragement. There are many free apps, such as QuitGuide from the CDC (Centers for Disease Control and Prevention). You can find other support for quitting smoking (smoking cessation) through smokefree.gov and other websites. HOW WILL I FEEL WHEN I QUIT SMOKING? Within the first 24 hours of quitting smoking, you may start to feel some withdrawal  symptoms. These symptoms are usually most noticeable 2-3 days after quitting, but they usually do not last beyond 2-3 weeks. Changes or symptoms that you might experience include:  Mood swings.  Restlessness, anxiety, or irritation.  Difficulty concentrating.  Dizziness.  Strong cravings for sugary foods in addition to nicotine.  Mild weight gain.  Constipation.  Nausea.  Coughing or a sore throat.  Changes in how your medicines work in your body.  A depressed mood.  Difficulty sleeping (insomnia). After the first 2-3 weeks of quitting, you may start to notice more positive results, such as:  Improved sense of smell and taste.  Decreased coughing and sore throat.  Slower heart rate.  Lower blood pressure.  Clearer skin.  The ability to breathe more easily.  Fewer sick days. Quitting smoking is very challenging for most people. Do not get discouraged if you are not successful the first time. Some people need to make many attempts to quit before they achieve long-term success. Do your best to stick to   your quit plan, and talk with your health care provider if you have any questions or concerns.   This information is not intended to replace advice given to you by your health care provider. Make sure you discuss any questions you have with your health care provider.   Document Released: 10/01/2001 Document Revised: 02/21/2015 Document Reviewed: 02/21/2015 Elsevier Interactive Patient Education 2016 Elsevier Inc.  

## 2016-04-17 DIAGNOSIS — G894 Chronic pain syndrome: Secondary | ICD-10-CM | POA: Diagnosis not present

## 2016-04-17 DIAGNOSIS — F419 Anxiety disorder, unspecified: Secondary | ICD-10-CM | POA: Diagnosis not present

## 2016-04-17 DIAGNOSIS — Z681 Body mass index (BMI) 19 or less, adult: Secondary | ICD-10-CM | POA: Diagnosis not present

## 2016-05-16 NOTE — Addendum Note (Signed)
Addended by: Melodye Ped C on: 05/16/2016 11:29 AM   Modules accepted: Orders

## 2016-08-16 DIAGNOSIS — I2510 Atherosclerotic heart disease of native coronary artery without angina pectoris: Secondary | ICD-10-CM | POA: Diagnosis not present

## 2016-08-16 DIAGNOSIS — I714 Abdominal aortic aneurysm, without rupture: Secondary | ICD-10-CM | POA: Diagnosis not present

## 2016-08-16 DIAGNOSIS — Z681 Body mass index (BMI) 19 or less, adult: Secondary | ICD-10-CM | POA: Diagnosis not present

## 2016-08-16 DIAGNOSIS — Z1389 Encounter for screening for other disorder: Secondary | ICD-10-CM | POA: Diagnosis not present

## 2016-08-16 DIAGNOSIS — G894 Chronic pain syndrome: Secondary | ICD-10-CM | POA: Diagnosis not present

## 2016-08-16 DIAGNOSIS — I369 Nonrheumatic tricuspid valve disorder, unspecified: Secondary | ICD-10-CM | POA: Diagnosis not present

## 2016-10-09 ENCOUNTER — Observation Stay (HOSPITAL_COMMUNITY)
Admission: EM | Admit: 2016-10-09 | Discharge: 2016-10-10 | Disposition: A | Discharge status: Home / Self Care | Payer: Medicare Other | Attending: Internal Medicine | Admitting: Internal Medicine

## 2016-10-09 ENCOUNTER — Encounter (HOSPITAL_COMMUNITY): Payer: Self-pay

## 2016-10-09 ENCOUNTER — Emergency Department (HOSPITAL_COMMUNITY): Payer: Medicare Other

## 2016-10-09 DIAGNOSIS — Z7982 Long term (current) use of aspirin: Secondary | ICD-10-CM | POA: Diagnosis not present

## 2016-10-09 DIAGNOSIS — I25810 Atherosclerosis of coronary artery bypass graft(s) without angina pectoris: Secondary | ICD-10-CM | POA: Diagnosis not present

## 2016-10-09 DIAGNOSIS — Z9114 Patient's other noncompliance with medication regimen: Secondary | ICD-10-CM | POA: Diagnosis not present

## 2016-10-09 DIAGNOSIS — F17210 Nicotine dependence, cigarettes, uncomplicated: Secondary | ICD-10-CM | POA: Diagnosis not present

## 2016-10-09 DIAGNOSIS — Z8249 Family history of ischemic heart disease and other diseases of the circulatory system: Secondary | ICD-10-CM | POA: Insufficient documentation

## 2016-10-09 DIAGNOSIS — J449 Chronic obstructive pulmonary disease, unspecified: Secondary | ICD-10-CM | POA: Diagnosis not present

## 2016-10-09 DIAGNOSIS — Z951 Presence of aortocoronary bypass graft: Secondary | ICD-10-CM | POA: Diagnosis not present

## 2016-10-09 DIAGNOSIS — Z952 Presence of prosthetic heart valve: Secondary | ICD-10-CM | POA: Diagnosis not present

## 2016-10-09 DIAGNOSIS — R0789 Other chest pain: Secondary | ICD-10-CM | POA: Diagnosis not present

## 2016-10-09 DIAGNOSIS — R079 Chest pain, unspecified: Secondary | ICD-10-CM | POA: Diagnosis not present

## 2016-10-09 DIAGNOSIS — F419 Anxiety disorder, unspecified: Secondary | ICD-10-CM | POA: Insufficient documentation

## 2016-10-09 DIAGNOSIS — I252 Old myocardial infarction: Secondary | ICD-10-CM | POA: Diagnosis not present

## 2016-10-09 DIAGNOSIS — I2510 Atherosclerotic heart disease of native coronary artery without angina pectoris: Secondary | ICD-10-CM | POA: Insufficient documentation

## 2016-10-09 DIAGNOSIS — E7800 Pure hypercholesterolemia, unspecified: Secondary | ICD-10-CM | POA: Insufficient documentation

## 2016-10-09 DIAGNOSIS — Z720 Tobacco use: Secondary | ICD-10-CM | POA: Diagnosis not present

## 2016-10-09 DIAGNOSIS — I25119 Atherosclerotic heart disease of native coronary artery with unspecified angina pectoris: Secondary | ICD-10-CM | POA: Diagnosis present

## 2016-10-09 DIAGNOSIS — R069 Unspecified abnormalities of breathing: Secondary | ICD-10-CM | POA: Diagnosis not present

## 2016-10-09 DIAGNOSIS — M79603 Pain in arm, unspecified: Secondary | ICD-10-CM | POA: Diagnosis not present

## 2016-10-09 HISTORY — DX: Acute myocardial infarction, unspecified: I21.9

## 2016-10-09 LAB — CBC
HCT: 40.1 % (ref 39.0–52.0)
HEMOGLOBIN: 13.7 g/dL (ref 13.0–17.0)
MCH: 34.5 pg — AB (ref 26.0–34.0)
MCHC: 34.2 g/dL (ref 30.0–36.0)
MCV: 101 fL — ABNORMAL HIGH (ref 78.0–100.0)
PLATELETS: 159 10*3/uL (ref 150–400)
RBC: 3.97 MIL/uL — AB (ref 4.22–5.81)
RDW: 13.6 % (ref 11.5–15.5)
WBC: 9.6 10*3/uL (ref 4.0–10.5)

## 2016-10-09 LAB — BASIC METABOLIC PANEL
Anion gap: 6 (ref 5–15)
BUN: 12 mg/dL (ref 6–20)
CALCIUM: 9 mg/dL (ref 8.9–10.3)
CO2: 24 mmol/L (ref 22–32)
CREATININE: 0.9 mg/dL (ref 0.61–1.24)
Chloride: 106 mmol/L (ref 101–111)
Glucose, Bld: 79 mg/dL (ref 65–99)
Potassium: 4 mmol/L (ref 3.5–5.1)
Sodium: 136 mmol/L (ref 135–145)

## 2016-10-09 LAB — TROPONIN I

## 2016-10-09 IMAGING — DX DG CHEST 2V
2 series · 2 of 2 positions shown · non-contrast
Comparison: [DATE]

CLINICAL DATA: Chest heaviness and pressure

EXAM:
CHEST  2 VIEW

[chest pa]
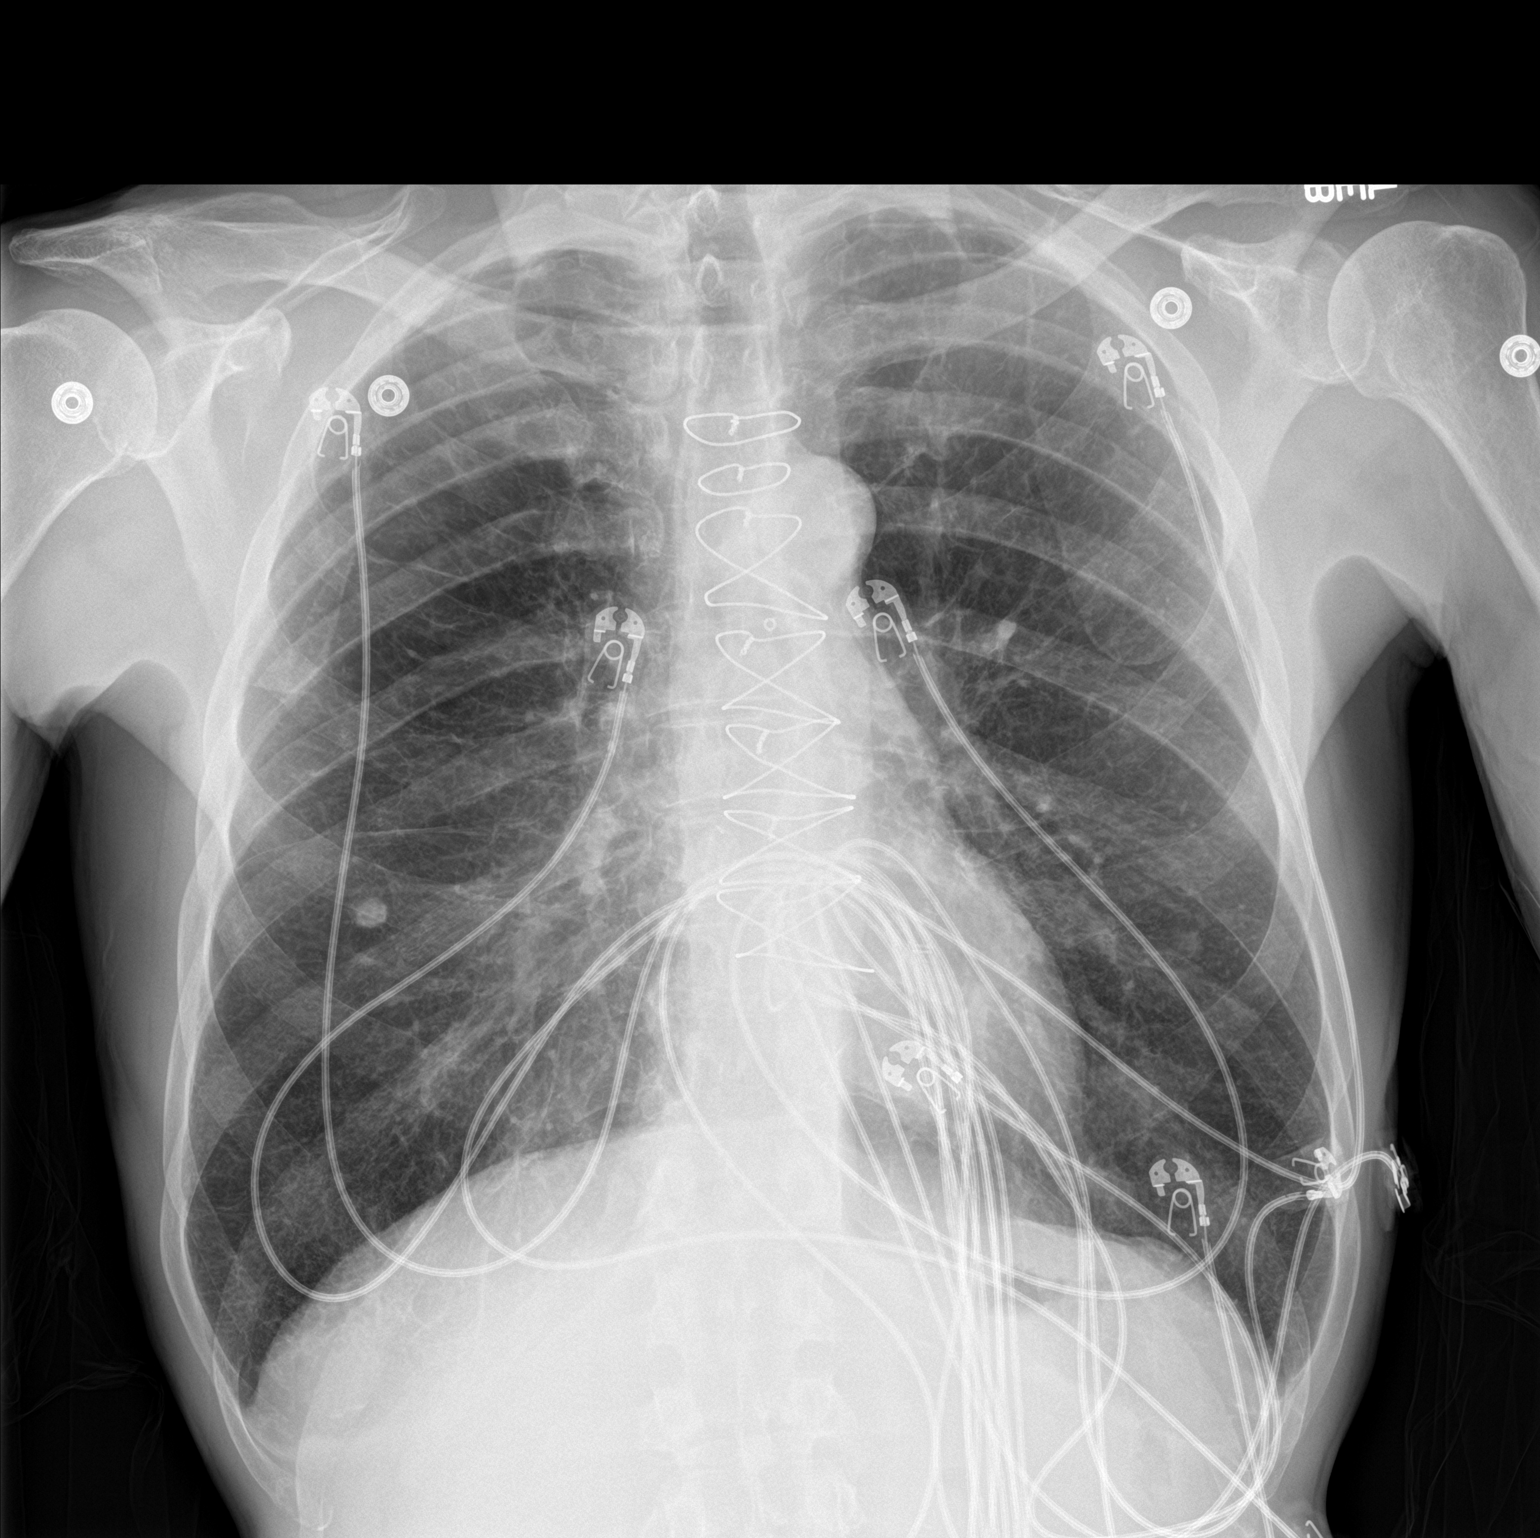

[chest lat]
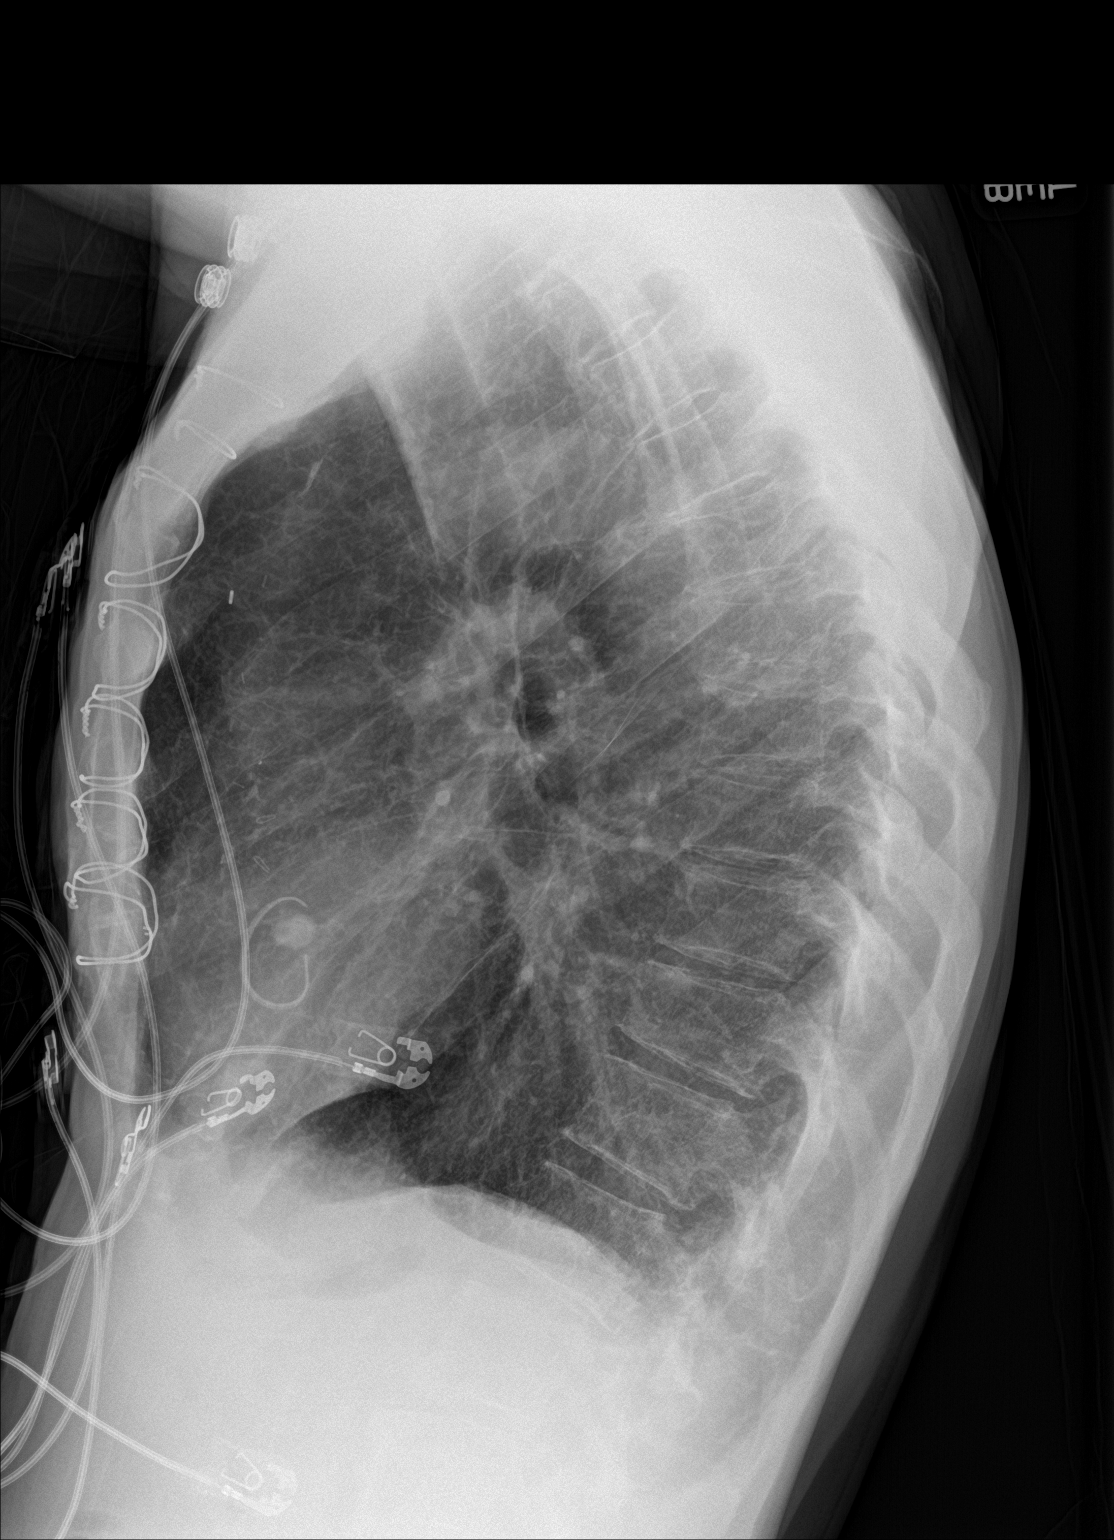

[2 of 2 positions shown; findings below may reference images not displayed]

FINDINGS: Post sternotomy changes. Multiple leads obscure portions of the
chest. The lungs are hyperinflated. Stable calcified right middle
lobe nodule. Emphysematous changes are present within the upper
lobes. No pneumothorax.
IMPRESSION: 1. Hyperinflation with emphysematous changes in the upper lobes. No
acute infiltrate or edema.

## 2016-10-09 MED ORDER — ALPRAZOLAM 0.5 MG PO TABS
1.0000 mg | ORAL_TABLET | Freq: Once | ORAL | Status: AC
  Administered 2016-10-09: 1 mg via ORAL
  Filled 2016-10-09: qty 2

## 2016-10-09 MED ORDER — SODIUM CHLORIDE 0.9 % IV BOLUS (SEPSIS)
1000.0000 mL | Freq: Once | INTRAVENOUS | Status: AC
  Administered 2016-10-09: 1000 mL via INTRAVENOUS

## 2016-10-09 MED ORDER — NICOTINE 14 MG/24HR TD PT24
14.0000 mg | MEDICATED_PATCH | Freq: Once | TRANSDERMAL | Status: DC
  Administered 2016-10-09: 14 mg via TRANSDERMAL
  Filled 2016-10-09: qty 1

## 2016-10-09 NOTE — ED Provider Notes (Signed)
[facility]-EMER[facility]ENCY DEPT Provider Note   CSN: [id number] Arrival date & time: 10/09/16  1947     History   Chief Complaint Chief Complaint  Patient presents with  . Chest Pain    HPI Cleda Daubndrew R Prieur is a 65 y.o. male.  Pt is a 65 y/o M with PMH of COPD, AAA, and CABG (2016) who presents to ED for L sided chest pain radiating to L arm, onset at approx 1600 today, describes as pressure, with associated SOB, last approx 30 minutes and resolved after given 324mg  ASA and O2 by EMS. Denies CP or SOB at this time. +significant family hx of CAD, +cigarette smoker (3 PPD). Last echo prior to CABG in 2016. Denies fevers, chills, unexplained weight loss, dizziness, vision or gait changes, cough, hemoptysis, abd pain, n/v/d,  extremity numbness/tingling/swelling, hx of DVT/PE, or any additional concerns. Cardiologist is Dr. Purvis SheffieldKoneswaran. Reports he has been noncompliant with his medications for approx 4 months, does take 81mg  ASA daily.    The history is provided by the patient. No language interpreter was used.    Past Medical History:  Diagnosis Date  . AAA (abdominal aortic aneurysm) (HCC)   . Anxiety   . Arthritis   . COPD (chronic obstructive pulmonary disease) (HCC)   . MI (myocardial infarction)   . Pulmonary nodule    benign    Patient Active Problem List   Diagnosis Date Noted  . Pure hypercholesterolemia 03/21/2015  . 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 AAA (abdominal aortic aneurysm) without rupture (HCC) 03/01/2014    Past Surgical History:  Procedure Laterality Date  . AORTIC VALVE REPLACEMENT    . CORONARY ARTERY BYPASS GRAFT    . INGUINAL HERNIA REPAIR Bilateral   . VALVE REPLACEMENT         Home Medications    Prior to Admission medications   Medication Sig Start Date End Date Taking? Authorizing Provider  amiodarone (PACERONE) 200 MG tablet Take 200 mg by mouth 2 (two) times daily.   Yes Historical Provider, MD  aspirin EC 81 MG tablet Take  324 mg by mouth once.    Yes Historical Provider, MD  atorvastatin (LIPITOR) 40 MG tablet Take 40 mg by mouth daily at 6 PM.  02/28/15  Yes Historical Provider, MD  carvedilol (COREG) 3.125 MG tablet Take 3.125 mg by mouth 2 (two) times daily with a meal.  02/28/15  Yes Historical Provider, MD  clopidogrel (PLAVIX) 75 MG tablet Take 75 mg by mouth daily.  02/28/15  Yes Historical Provider, MD  furosemide (LASIX) 20 MG tablet Take 20 mg by mouth 2 (two) times daily.  02/28/15  Yes Historical Provider, MD  potassium chloride SA (K-DUR,KLOR-CON) 20 MEQ tablet Take 1 tablet (20 mEq total) by mouth daily. 03/23/15  Yes Laqueta LindenSuresh A Koneswaran, MD  vitamin C (ASCORBIC ACID) 500 MG tablet Take 500 mg by mouth daily.   Yes Historical Provider, MD  ALPRAZolam Prudy Feeler(XANAX) 1 MG tablet Take 1 mg by mouth 4 (four) times daily as needed. For anxiety 09/17/16   Historical Provider, MD  HYDROcodone-acetaminophen (NORCO) 7.5-325 MG tablet Take 1 tablet by mouth 4 (four) times daily as needed. For pain 08/16/16   Historical Provider, MD    Family History Family History  Problem Relation Age of Onset  . Heart disease Father     Social History Social History  Substance Use Topics  . Smoking status: Current Every Day Smoker    Packs/day: 3.00  Years: 60.00    Types: Cigarettes    Last attempt to quit: 02/11/2015  . Smokeless tobacco: Never Used  . Alcohol use Not on file     Comment: one beer a week     Allergies   Patient has no known allergies.   Review of Systems Review of Systems  Constitutional: Negative for chills, fever and unexpected weight change.  Respiratory: Positive for shortness of breath. Negative for cough.   Cardiovascular: Positive for chest pain. Negative for palpitations and leg swelling.  Gastrointestinal: Negative for abdominal pain, diarrhea, nausea and vomiting.  Genitourinary: Negative for dysuria and frequency.  Musculoskeletal: Negative for back pain and neck pain.  Skin: Negative  for rash.  Neurological: Negative for dizziness, weakness, numbness and headaches.  All other systems reviewed and are negative.    Physical Exam Updated Vital Signs BP 139/72 (BP Location: Left Arm)   Pulse 71   Temp 98.3 F (36.8 C) (Oral)   Resp 18   Ht 5\' 7"  (1.702 m)   Wt 58.5 kg   SpO2 100%   BMI 20.20 kg/m   Physical Exam  Constitutional: He is oriented to person, place, and time. He appears well-developed and well-nourished.  HENT:  Head: Normocephalic and atraumatic.  Mouth/Throat: Oropharynx is clear and moist.  Eyes: Conjunctivae and EOM are normal. Pupils are equal, round, and reactive to light.  Neck: Normal range of motion. Neck supple. No JVD present.  Cardiovascular: Normal rate, regular rhythm, normal heart sounds and intact distal pulses.  Exam reveals no gallop and no friction rub.   No murmur heard. Pulmonary/Chest: Effort normal and breath sounds normal. He has no wheezes. He has no rales.  Abdominal: Soft. Bowel sounds are normal.  Musculoskeletal: Normal range of motion. He exhibits no edema.  Neurological: He is alert and oriented to person, place, and time.  Skin: Skin is warm and dry.  Nursing note and vitals reviewed.    ED Treatments / Results  Labs (all labs ordered are listed, but only abnormal results are displayed) Labs Reviewed  BASIC METABOLIC PANEL  CBC  TROPONIN I    EKG  EKG Interpretation None       Radiology No results found.  Procedures Procedures (including critical care time)  Medications Ordered in ED Medications - No data to display   Initial Impression / Assessment and Plan / ED Course  I have reviewed the triage vital signs and the nursing notes.  Pertinent labs & imaging results that were available during my care of the patient were reviewed by me and considered in my medical decision making (see chart for details).  Clinical Course    Pt is a 65 y/o M who presents to ED for CP and SOB, concern for  unable angina. Will get labs including cardiac enzymes, CXR for pna. Doubt ptx or PE at this time. Pt currently CP free, will continue to monitor.   9:36 PM  Pt and family updated on results, plan to admit; pt amenable to plan  10:24 PM Pt requesting his xanax, order placed; remains CP free; plan to admit.   Final Clinical Impressions(s) / ED Diagnoses   Final diagnoses:  None    New Prescriptions New Prescriptions   No medications on file     Albesa SeenRobyn K Shean Gerding, NP 10/09/16 2350    Eber HongBrian Miller, MD 10/10/16 2055

## 2016-10-09 NOTE — H&P (Signed)
[name] is an 65 y.o. male.   Chief Complaint: chest pain HPI:  The patient is a 65 yo man with CAD, history of CABG many years ago, smoker, history of aortic valve replacement, who stopped taking his medications, who presents with acute chest pain.   Onset: today. Duration: intermittent.  Location: substernal. Radiation: left shoulder. Character: 8/10 at times. Pressure and heaviness. Alleviated by: Nothing. Exacerbated by: exertion. Associated Symptoms: Shortness of breath. No diaphoresis. Treatments: none at home except usual medications.    Past Medical History:  Diagnosis Date  . AAA (abdominal aortic aneurysm) ([place])   . Anxiety   . Arthritis   . COPD (chronic obstructive pulmonary disease) ([place])   . MI (myocardial infarction)   . Pulmonary nodule    benign    Past Surgical History:  Procedure Laterality Date  . AORTIC VALVE REPLACEMENT    . CORONARY ARTERY BYPASS GRAFT    . INGUINAL HERNIA REPAIR Bilateral   . VALVE REPLACEMENT      Family History  Problem Relation Age of Onset  . Heart disease Father    Social History:  reports that he has been smoking Cigarettes.  He has a 180.00 pack-year smoking history. He has never used smokeless tobacco. He reports that he drinks about 0.6 oz of alcohol per week . He reports that he does not use drugs.  Allergies: No Known Allergies  No current facility-administered medications on file prior to encounter.    Current Outpatient Prescriptions on File Prior to Encounter  Medication Sig Dispense Refill  . aspirin EC 81 MG tablet Take 324 mg by mouth once.     Marland Kitchen atorvastatin (LIPITOR) 40 MG tablet Take 40 mg by mouth daily at 6 PM.   3  . carvedilol (COREG) 3.125 MG tablet Take 3.125 mg by mouth 2 (two) times daily with a meal.   3  . clopidogrel (PLAVIX) 75 MG tablet Take 75 mg by mouth daily.   3  . furosemide (LASIX) 20 MG tablet Take 20 mg by mouth 2 (two) times daily.   0  . potassium chloride SA (K-DUR,KLOR-CON)  20 MEQ tablet Take 1 tablet (20 mEq total) by mouth daily. 30 tablet 6     (Not in a hospital admission)  Results for orders placed or performed during the hospital encounter of 10/09/16 (from the past 48 hour(s))  Basic metabolic panel     Status: None   Collection Time: 10/09/16  8:16 PM  Result Value Ref Range   Sodium 136 135 - 145 mmol/L   Potassium 4.0 3.5 - 5.1 mmol/L   Chloride 106 101 - 111 mmol/L   CO2 24 22 - 32 mmol/L   Glucose, Bld 79 65 - 99 mg/dL   BUN 12 6 - 20 mg/dL   Creatinine, Ser 0.90 0.61 - 1.24 mg/dL   Calcium 9.0 8.9 - 10.3 mg/dL   GFR calc non Af Amer >60 >60 mL/min   GFR calc Af Amer >60 >60 mL/min    Comment: (NOTE) The eGFR has been calculated using the CKD EPI equation. This calculation has not been validated in all clinical situations. eGFR's persistently <60 mL/min signify possible Chronic Kidney Disease.    Anion gap 6 5 - 15  CBC     Status: Abnormal   Collection Time: 10/09/16  8:16 PM  Result Value Ref Range   WBC 9.6 4.0 - 10.5 K/uL   RBC 3.97 (L) 4.22 - 5.81 MIL/uL  Hemoglobin 13.7 13.0 - 17.0 g/dL   HCT 40.1 39.0 - 52.0 %   MCV 101.0 (H) 78.0 - 100.0 fL   MCH 34.5 (H) 26.0 - 34.0 pg   MCHC 34.2 30.0 - 36.0 g/dL   RDW 13.6 11.5 - 15.5 %   Platelets 159 150 - 400 K/uL  Troponin I     Status: None   Collection Time: 10/09/16  8:16 PM  Result Value Ref Range   Troponin I <0.03 <0.03 ng/mL   Dg Chest 2 View  Result Date: 10/09/2016 CLINICAL DATA:  Chest heaviness and pressure EXAM: CHEST  2 VIEW COMPARISON:  01/23/2014 FINDINGS: Post sternotomy changes. Multiple leads obscure portions of the chest. The lungs are hyperinflated. Stable calcified right middle lobe nodule. Emphysematous changes are present within the upper lobes. No pneumothorax. IMPRESSION: 1. Hyperinflation with emphysematous changes in the upper lobes. No acute infiltrate or edema. Electronically Signed   By: Donavan Foil M.D.   On: 10/09/2016 20:47     ROS  ROS: GENERAL: No Fever, chills, or diaphoresis. Positive for fatigue/malaise.  HEENT: No nasal discharge or bleeding. No throat pain or swelling.   RESPIRATORY: No cough, wheezing. Positive for shortness of breath.  CARDIOVASCULAR: Chest pain. No palpitations.  GI: No abdominal pain, nausea, vomiting, diarrhea, constipation, or bloody stool.  NEUROLOGICAL: No headache or focal weakness.  INTEGUMENT: no rashes, itching, or new lesions.  LYMPHATIC SYSTEM: no lymph node swelling or pain.  MUSCULOSKELETAL: no new joint pain or joint swelling.  GENITOURINARY: No dysuria or hematuria.  ENDOCRINE: No polyuria or polydipsia.  HEME: No chronic anemia, bleeding, or easy bruising.    Blood pressure 133/75, pulse 78, temperature 98.3 F (36.8 C), temperature source Oral, resp. rate 15, height 5' 7" (1.702 m), weight 58.5 kg (129 lb), SpO2 99 %. Physical Exam   Vitals initial: T 98.3  P  71  BP 139/72  RR 18  O2 100%  Physical Exam: GENERAL: Ill-appearing, well nourished, well-developed.  HEENT: Normocephalic, atraumatic; pupils equal and round. Nares patent, without discharge or bleeding. No oropharyngeal lesions or erythema. Mucous membranes are dry.  NECK: is supple, no masses, trachea midline.  RESPIRATORY: Clear to auscultation bilaterally. Chest wall movements are symmetric. No use of accessory muscles to breathe.  No rales, rhonchi. Minimal scattered wheezing bilaterally. CARDIOVASCULAR: Normal S1, S2. No rubs, or gallops. PMI non-displaced. Carotids: no carotid bruits. No bradycardia or tachycardia. DP pulses 2+ bilaterally. Murmur 2/6 systolic. GI: soft, nontender, non-distended, normal active bowel sounds. No hepatosplenomegaly.  INTEGUMENT: Clean, dry, and intact. No rashes. MUSCULOSKELETAL: Moving all extremities. No cyanosis. No clubbing. Edema: none bilaterally.  NEUROLOGICAL: Cranial nerves 2-12 grossly intact. Reflexes: 2+ bilaterally. Motor 5/5 throughout. Sensory  grossly intact to light touch. No pronator drift.  PSYCHIATRIC: Fully oriented. Normal and appropriate affect.  LYMPHATIC: No cervical lymphadenopathy. No supraclavicular lymphadenopathy.    Assessment/Plan  Patient Active Problem List   Diagnosis Date Noted  . Chest pain 10/09/2016    Priority: High Plan: Chest pain order set. Aspirin 324 mg x 1 given, then daily aspirin. Nitroglycerin prn. Beta blocker if patient can tolerate given blood pressure and heart rate. ACE/ARB if patient can tolerate given blood pressure. Statin. Measure lipid panel. Telemetry. Troponin q6 hrs x 3. Oxygen support by nasal cannula as needed. Morphine PRN. Stress test ordered for the am. Patient has been advised: if the stress test is negative, follow up with your primary care physician for further evaluation of your chest  pain.   Marland Kitchen CAD of autologous bypass graft 10/09/2016    Priority: High Increased risk of further CAD.  Plan: See Chest pain order set. Discuss restarting medications.   . Pure hypercholesterolemia 03/21/2015    Priority: High Plan: Statin. Check lipid panel.   . Tobacco abuse 10/09/2016    Priority: Medium Plan: Counsel to quit.  . S/P CABG (coronary artery bypass graft) 03/13/2015    Priority: Medium  . AAA (abdominal aortic aneurysm) without rupture ([place]) 03/01/2014       Tacey Ruiz, MD 10/09/2016, 11:35 PM

## 2016-10-09 NOTE — ED Triage Notes (Signed)
Pt reports chest pain that started tonight and lasted for approx 30 minutes, pt reports after taking 4 baby ASA and receiving oxygen from EMS chest pain resolved. Pt reported SOB that accompanied CP. 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 Pt reports last time he saw his heart Dr was  in August.

## 2016-10-09 NOTE — ED Provider Notes (Signed)
The patient is a 65 year old male who is admittedly noncompliant with his heart medications, he does take a baby aspirin, he is a long distance truck driver, smokes 3 packs a day, and just got back from [place] and states that around 4:00 in the afternoon he developed acute onset of pain that went from his left chest into his shoulder and arm. This is improved and is still somewhat there. On exam the patient has clear heart and lung sounds, no murmur, no arrhythmia, no edema, no JVD. Reviewed his EKG, labs, x-ray, there is no acute findings to suggest that he is having a STEMI however given his history after suspected this is unstable angina and the patient is potentially critically ill. Initially the patient refused to be admitted to the hospital. We'll attempt another discussion with the patient prior to allowing him to leave. He does have his normal mental status and medical decision making capacity to make that decision if he should desire.  Pt agreeable to d/c after extensive discussion  ED ECG REPORT  I personally interpreted this EKG   Date: 10/10/2016   Rate: 63  Rhythm: normal sinus rhythm  QRS Axis: normal  Intervals: normal  ST/T Wave abnormalities: normal  Conduction Disutrbances:none  Narrative Interpretation:  PRWP  Old EKG Reviewed: none available   Medical screening examination/treatment/procedure(s) were conducted as a shared visit with non-physician practitioner(s) and myself.  I personally evaluated the patient during the encounter.  Clinical Impression:   Final diagnoses:  Chest pain, unspecified type         Eber HongBrian Charvi Gammage, MD 10/10/16 2055

## 2016-10-10 ENCOUNTER — Encounter (HOSPITAL_COMMUNITY): Payer: Self-pay | Admitting: Internal Medicine

## 2016-10-10 ENCOUNTER — Ambulatory Visit (HOSPITAL_COMMUNITY): Payer: Self-pay

## 2016-10-10 ENCOUNTER — Encounter: Payer: Self-pay | Admitting: Family

## 2016-10-10 DIAGNOSIS — I25810 Atherosclerosis of coronary artery bypass graft(s) without angina pectoris: Secondary | ICD-10-CM | POA: Diagnosis not present

## 2016-10-10 DIAGNOSIS — E7800 Pure hypercholesterolemia, unspecified: Secondary | ICD-10-CM | POA: Diagnosis not present

## 2016-10-10 DIAGNOSIS — Z951 Presence of aortocoronary bypass graft: Secondary | ICD-10-CM | POA: Diagnosis not present

## 2016-10-10 DIAGNOSIS — R079 Chest pain, unspecified: Secondary | ICD-10-CM | POA: Diagnosis not present

## 2016-10-10 DIAGNOSIS — Z720 Tobacco use: Secondary | ICD-10-CM | POA: Diagnosis not present

## 2016-10-10 LAB — TROPONIN I: Troponin I: <0.03 ng/mL (ref <0.03)

## 2016-10-10 MED ORDER — SODIUM CHLORIDE 0.9 % IV SOLN
INTRAVENOUS | Status: DC
  Administered 2016-10-10: 04:00:00 via INTRAVENOUS

## 2016-10-10 MED ORDER — CARVEDILOL 3.125 MG PO TABS
3.1250 mg | ORAL_TABLET | Freq: Two times a day (BID) | ORAL | Status: DC
  Filled 2016-10-10 (×3): qty 1

## 2016-10-10 MED ORDER — LISINOPRIL 5 MG PO TABS: 5.0000 mg | ORAL_TABLET | Freq: Every day | ORAL | Status: DC

## 2016-10-10 MED ORDER — ACETAMINOPHEN 325 MG PO TABS: 650.0000 mg | ORAL_TABLET | ORAL | Status: DC | PRN

## 2016-10-10 MED ORDER — MORPHINE SULFATE (PF) 2 MG/ML IV SOLN
2.0000 mg | INTRAVENOUS | Status: DC | PRN
  Administered 2016-10-10: 2 mg via INTRAVENOUS
  Filled 2016-10-10: qty 1

## 2016-10-10 MED ORDER — CLOPIDOGREL BISULFATE 75 MG PO TABS: 75.0000 mg | ORAL_TABLET | Freq: Every day | ORAL | Status: DC

## 2016-10-10 MED ORDER — NITROGLYCERIN 0.4 MG SL SUBL: 0.4000 mg | SUBLINGUAL_TABLET | SUBLINGUAL | Status: DC | PRN

## 2016-10-10 MED ORDER — ENOXAPARIN SODIUM 40 MG/0.4ML ~~LOC~~ SOLN
40.0000 mg | SUBCUTANEOUS | Status: DC
  Administered 2016-10-10: 40 mg via SUBCUTANEOUS
  Filled 2016-10-10: qty 0.4

## 2016-10-10 MED ORDER — ATORVASTATIN CALCIUM 40 MG PO TABS: 40.0000 mg | ORAL_TABLET | Freq: Every day | ORAL | Status: DC

## 2016-10-10 MED ORDER — ONDANSETRON HCL 4 MG/2ML IJ SOLN: 4.0000 mg | Freq: Four times a day (QID) | INTRAMUSCULAR | Status: DC | PRN

## 2016-10-10 MED ORDER — ASPIRIN 325 MG PO TABS
325.0000 mg | ORAL_TABLET | Freq: Every day | ORAL | Status: DC
Start: 2016-10-10 — End: 2016-10-10

## 2016-10-10 NOTE — ED Notes (Signed)
Spoke with Dr Conley RollsLe informed that pt wanted to be discharged. Per Dr Conley RollsLe pt can sign out AMA or wait after Dr Conley RollsLe finished rounds in ICU.Pt given options and wants to be sign out AMA

## 2016-10-10 NOTE — ED Notes (Signed)
Pt signed AMA. Explained risk. States understanding

## 2016-10-10 NOTE — ED Notes (Signed)
Repaged Dr Conley RollsLe. Due to no response. Paged by Caron Presume Waddell

## 2016-10-10 NOTE — ED Notes (Signed)
No response from Dr Conley RollsLe. Repaged by unit secretary T. Mordecai Maeswaddell

## 2016-10-10 NOTE — ED Notes (Signed)
Pt given coffee per request

## 2016-10-10 NOTE — ED Notes (Signed)
Pt informed his bed was ready on 300. States he wants to go home. Dr Conley RollsLe paged

## 2016-10-10 NOTE — ED Notes (Signed)
Dr Conley RollsLe overhead paged by [employer] secretary

## 2016-10-11 NOTE — Discharge Summary (Signed)
Physician Discharge Summary  Patient ID: [name] MRN: [id number] DOB/AGE: 1951-05-23 65 y.o.  Admit date: 10/09/2016 Discharge date: 10/10/2016  Admission Diagnoses: Principal Problem:   Chest pain Active Problems:   Pure hypercholesterolemia   CAD of autologous bypass graft   S/P CABG (coronary artery bypass graft)   Tobacco abuse   Discharge Diagnoses:  Principal Problem:   Chest pain Active Problems:   Pure hypercholesterolemia   CAD of autologous bypass graft   S/P CABG (coronary artery bypass graft)   Tobacco abuse   Discharged Condition: fair  Hospital Course:   HPI: The patient is a 65 yo man with CAD, remote history of CABG, smoker, history of aortic valve replacement, who stopped taking his medications, who presented with acute chest pain. He was admitted to the hospital late in the evening on 10/09/16.  Onset: today. Duration: intermittent. Location: substernal. Radiation: left shoulder. Character: 8/10 at times. Pressure and heaviness. Alleviated by: Nothing. Exacerbated by: exertion. Associated Symptoms: Shortness of breath. No diaphoresis. Treatments: none at home except usual medications.  Hospital Course: He did have relief of his chest pain and was placed under the chest pain protocol order set. The plan was for the patient to have a stress test in the am, but unfortunately he signed out against medical advice before 0900 on 10/10/16. The nurse advised him of the risks.  He did have 3 negative troponin levels prior to leaving.    See hospital course above.   Chest Pain        Consults: None  Significant Diagnostic Studies: labs:  Results for orders placed or performed during the hospital encounter of 10/09/16 (from the past 72 hour(s))  Basic metabolic panel   Collection Time: 10/09/16  8:16 PM  Result Value Ref Range   Sodium 136 135 - 145 mmol/L   Potassium 4.0 3.5 - 5.1 mmol/L   Chloride 106 101 - 111 mmol/L   CO2 24 22 - 32  mmol/L   Glucose, Bld 79 65 - 99 mg/dL   BUN 12 6 - 20 mg/dL   Creatinine, Ser 4.090.90 0.61 - 1.24 mg/dL   Calcium 9.0 8.9 - 81.110.3 mg/dL   GFR calc non Af Amer >60 >60 mL/min   GFR calc Af Amer >60 >60 mL/min   Anion gap 6 5 - 15  CBC   Collection Time: 10/09/16  8:16 PM  Result Value Ref Range   WBC 9.6 4.0 - 10.5 K/uL   RBC 3.97 (L) 4.22 - 5.81 MIL/uL   Hemoglobin 13.7 13.0 - 17.0 g/dL   HCT 91.440.1 78.239.0 - 95.652.0 %   MCV 101.0 (H) 78.0 - 100.0 fL   MCH 34.5 (H) 26.0 - 34.0 pg   MCHC 34.2 30.0 - 36.0 g/dL   RDW 21.313.6 08.611.5 - 57.815.5 %   Platelets 159 150 - 400 K/uL  Troponin I   Collection Time: 10/09/16  8:16 PM  Result Value Ref Range   Troponin I <0.03 <0.03 ng/mL  Troponin I-serum (0, 3, 6 hours)   Collection Time: 10/10/16  3:20 AM  Result Value Ref Range   Troponin I <0.03 <0.03 ng/mL  Troponin I-serum (0, 3, 6 hours)   Collection Time: 10/10/16  6:20 AM  Result Value Ref Range   Troponin I <0.03 <0.03 ng/mL    EKG: 63 BPM. Normal sinus rhythm. Anteroseptal infarct, age undetermined. Viewed personally.    Treatments: analgesia, cardiac medications, telemetry monitoring.  Discharge Exam: Blood pressure 105/60, pulse 66,  temperature 97.9 F (36.6 C), temperature source Oral, resp. rate 24, height 5\' 7"  (1.702 m), weight 58.5 kg (129 lb), SpO2 99 %. Patient did not wait for discharge exam and left against medical advice before MD could see him.  Disposition: 01-Home or Self Care   Allergies as of 10/10/2016   No Known Allergies     Medication List    ASK your doctor about these medications   ALPRAZolam 1 MG tablet Commonly known as:  XANAX Take 1 mg by mouth 4 (four) times daily as needed. For anxiety   amiodarone 200 MG tablet Commonly known as:  PACERONE Take 200 mg by mouth 2 (two) times daily.   aspirin EC 81 MG tablet Take 324 mg by mouth once.   atorvastatin 40 MG tablet Commonly known as:  LIPITOR Take 40 mg by mouth daily at 6 PM.   carvedilol 3.125  MG tablet Commonly known as:  COREG Take 3.125 mg by mouth 2 (two) times daily with a meal.   clopidogrel 75 MG tablet Commonly known as:  PLAVIX Take 75 mg by mouth daily.   furosemide 20 MG tablet Commonly known as:  LASIX Take 20 mg by mouth 2 (two) times daily.   HYDROcodone-acetaminophen 7.5-325 MG tablet Commonly known as:  NORCO Take 1 tablet by mouth 4 (four) times daily as needed. For pain   potassium chloride SA 20 MEQ tablet Commonly known as:  K-DUR,KLOR-CON Take 1 tablet (20 mEq total) by mouth daily.   vitamin C 500 MG tablet Commonly known as:  ASCORBIC ACID Take 500 mg by mouth daily.        Signed: Marlow BaarsKendall Sylwia Cuervo 10/11/2016, 7:23 PM

## 2016-10-15 ENCOUNTER — Ambulatory Visit: Payer: Self-pay | Admitting: Family

## 2016-10-15 ENCOUNTER — Ambulatory Visit (HOSPITAL_COMMUNITY)
Admission: RE | Admit: 2016-10-15 | Discharge: 2016-10-15 | Disposition: A | Discharge status: Home / Self Care | Payer: Medicare Other | Source: Ambulatory Visit | Attending: Surgery | Admitting: Surgery

## 2016-10-15 DIAGNOSIS — I714 Abdominal aortic aneurysm, without rupture, unspecified: Secondary | ICD-10-CM

## 2016-10-15 DIAGNOSIS — E782 Mixed hyperlipidemia: Secondary | ICD-10-CM | POA: Diagnosis not present

## 2016-10-15 DIAGNOSIS — Z6820 Body mass index (BMI) 20.0-20.9, adult: Secondary | ICD-10-CM | POA: Diagnosis not present

## 2016-10-15 DIAGNOSIS — F419 Anxiety disorder, unspecified: Secondary | ICD-10-CM | POA: Diagnosis not present

## 2016-10-15 DIAGNOSIS — Z125 Encounter for screening for malignant neoplasm of prostate: Secondary | ICD-10-CM | POA: Diagnosis not present

## 2016-10-15 DIAGNOSIS — I2510 Atherosclerotic heart disease of native coronary artery without angina pectoris: Secondary | ICD-10-CM | POA: Diagnosis not present

## 2016-10-15 DIAGNOSIS — Z1389 Encounter for screening for other disorder: Secondary | ICD-10-CM | POA: Diagnosis not present

## 2016-10-15 DIAGNOSIS — G894 Chronic pain syndrome: Secondary | ICD-10-CM | POA: Diagnosis not present

## 2016-10-22 ENCOUNTER — Ambulatory Visit (INDEPENDENT_AMBULATORY_CARE_PROVIDER_SITE_OTHER): Payer: Medicare Other | Admitting: Family

## 2016-10-22 ENCOUNTER — Encounter: Payer: Self-pay | Admitting: Family

## 2016-10-22 VITALS — BP 133/75 | HR 91 | Temp 98.3°F | Resp 18 | Ht 67.0 in | Wt 128.1 lb

## 2016-10-22 DIAGNOSIS — F17200 Nicotine dependence, unspecified, uncomplicated: Secondary | ICD-10-CM | POA: Diagnosis not present

## 2016-10-22 DIAGNOSIS — I714 Abdominal aortic aneurysm, without rupture, unspecified: Secondary | ICD-10-CM

## 2016-10-22 NOTE — Patient Instructions (Addendum)
Abdominal Aortic Aneurysm Blood pumps away from the heart through tubes (blood vessels) called arteries. Aneurysms are weak or damaged places in the wall of an artery. It bulges out like a balloon. An abdominal aortic aneurysm happens in the main artery of the body (aorta). It can burst or tear, causing bleeding inside the body. This is an emergency. It needs treatment right away. What are the causes? The exact cause is unknown. Things that could cause this problem include:  Fat and other substances building up in the lining of a tube.  Swelling of the walls of a blood vessel.  Certain tissue diseases.  Belly (abdominal) trauma.  An infection in the main artery of the body. What increases the risk? There are things that make it more likely for you to have an aneurysm. These include:  Being over the age of 68 years old.  Having high blood pressure (hypertension).  Being a male.  Being white.  Being very overweight (obese).  Having a family history of aneurysm.  Using tobacco products. What are the signs or symptoms? Symptoms depend on the size of the aneurysm and how fast it grows. There may not be symptoms. If symptoms occur, they can include:  Pain (belly, side, lower back, or groin).  Feeling full after eating a small amount of food.  Feeling sick to your stomach (nauseous), throwing up (vomiting), or both.  Feeling a lump in your belly that feels like it is beating (pulsating).  Feeling like you will pass out (faint). How is this treated?  Medicine to control blood pressure and pain.  Imaging tests to see if the aneurysm gets bigger.  Surgery. How is this prevented? To lessen your chance of getting this condition:  Stop smoking. Stop chewing tobacco.  Limit or avoid alcohol.  Keep your blood pressure, blood sugar, and cholesterol within normal limits.  Eat less salt.  Eat foods low in saturated fats and cholesterol. These are found in animal and whole  dairy products.  Eat more fiber. Fiber is found in whole grains, vegetables, and fruits.  Keep a healthy weight.  Stay active and exercise often. This information is not intended to replace advice given to you by your health care provider. Make sure you discuss any questions you have with your health care provider. Document Released: 02/01/2013 Document Revised: 03/14/2016 Document Reviewed: 11/06/2012 Elsevier Interactive Patient Education  2017 Elsevier Inc.      Steps to Quit Smoking Smoking tobacco can be bad for your health. It can also affect almost every organ in your body. Smoking puts you and people around you at risk for many serious long-lasting (chronic) diseases. Quitting smoking is hard, but it is one of the best things that you can do for your health. It is never too late to quit. What are the benefits of quitting smoking? When you quit smoking, you lower your risk for getting serious diseases and conditions. They can include:  Lung cancer or lung disease.  Heart disease.  Stroke.  Heart attack.  Not being able to have children (infertility).  Weak bones (osteoporosis) and broken bones (fractures). If you have coughing, wheezing, and shortness of breath, those symptoms may get better when you quit. You may also get sick less often. If you are pregnant, quitting smoking can help to lower your chances of having a baby of low birth weight. What can I do to help me quit smoking? Talk with your doctor about what can help you quit smoking. Some   things you can do (strategies) include:  Quitting smoking totally, instead of slowly cutting back how much you smoke over a period of time.  Going to in-person counseling. You are more likely to quit if you go to many counseling sessions.  Using resources and support systems, such as:  Online chats with a counselor.  Phone quitlines.  Printed self-help materials.  Support groups or group counseling.  Text messaging  programs.  Mobile phone apps or applications.  Taking medicines. Some of these medicines may have nicotine in them. If you are pregnant or breastfeeding, do not take any medicines to quit smoking unless your doctor says it is okay. Talk with your doctor about counseling or other things that can help you. Talk with your doctor about using more than one strategy at the same time, such as taking medicines while you are also going to in-person counseling. This can help make quitting easier. What things can I do to make it easier to quit? Quitting smoking might feel very hard at first, but there is a lot that you can do to make it easier. Take these steps:  Talk to your family and friends. Ask them to support and encourage you.  Call phone quitlines, reach out to support groups, or work with a counselor.  Ask people who smoke to not smoke around you.  Avoid places that make you want (trigger) to smoke, such as:  Bars.  Parties.  Smoke-break areas at work.  Spend time with people who do not smoke.  Lower the stress in your life. Stress can make you want to smoke. Try these things to help your stress:  Getting regular exercise.  Deep-breathing exercises.  Yoga.  Meditating.  Doing a body scan. To do this, close your eyes, focus on one area of your body at a time from head to toe, and notice which parts of your body are tense. Try to relax the muscles in those areas.  Download or buy apps on your mobile phone or tablet that can help you stick to your quit plan. There are many free apps, such as QuitGuide from the CDC (Centers for Disease Control and Prevention). You can find more support from smokefree.gov and other websites. This information is not intended to replace advice given to you by your health care provider. Make sure you discuss any questions you have with your health care provider. Document Released: 08/03/2009 Document Revised: 06/04/2016 Document Reviewed:  02/21/2015 Elsevier Interactive Patient Education  2017 Elsevier Inc.       Before your next abdominal ultrasound:  Take two Extra-Strength Gas-X capsules at bedtime the night before the test. Take another two Extra-Strength Gas-X capsules 3 hours before the test.   

## 2016-10-22 NOTE — Progress Notes (Signed)
[facility] [place]   CC: Follow up Abdominal Aortic Aneurysm  History of Present Illness  [name] is a 66 y.o. ([birth year]) male patient of Dr. Arbie Cookey who returns today for follow-up of small abdominal aortic aneurysm.  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 He is seen today for one-year ultrasound follow-up to rule out any enlargement. He had an emergent coronary artery bypass grafting in [facility] about April 2016. Had chest pain and was found to have three-vessel disease and underwent uneventful bypass. He is recovered quite nicely from this. He has no symptoms referable to his aneurysm and no symptoms of lower extremity arterial insufficiency  The patient denies history of stroke or TIA symptoms.  Pt Diabetic: No Pt smoker: smoker  (3 ppd, started at age 26 yrs)   Past Medical History:  Diagnosis Date  . AAA (abdominal aortic aneurysm) (HCC)   . Anxiety   . Arthritis   . COPD (chronic obstructive pulmonary disease) (HCC)   . MI (myocardial infarction)   . Pulmonary nodule    benign   Past Surgical History:  Procedure Laterality Date  . AORTIC VALVE REPLACEMENT    . CORONARY ARTERY BYPASS GRAFT    . INGUINAL HERNIA REPAIR Bilateral   . VALVE REPLACEMENT     Social History Social History   Social History  . Marital status: Divorced    Spouse name: N/A  . Number of children: N/A  . Years of education: N/A   Occupational History  . Not on file.   Social History Main Topics  . Smoking status: Current Every Day Smoker    Packs/day: 3.00    Years: 60.00    Types: Cigarettes    Last attempt to quit: 02/11/2015  . Smokeless tobacco: Never Used  . Alcohol use 0.6 oz/week    1 Cans of beer per week     Comment: one beer a week  . Drug use: No  . Sexual activity: Not on file   Other Topics  Concern  . Not on file   Social History Narrative  . No narrative on file   Family History Family History  Problem Relation Age of Onset  . Heart disease Father     Current Outpatient Prescriptions on File Prior to Visit  Medication Sig Dispense Refill  . ALPRAZolam (XANAX) 1 MG tablet Take 1 mg by mouth 4 (four) times daily as needed. For anxiety  2  . amiodarone (PACERONE) 200 MG tablet Take 200 mg by mouth 2 (two) times daily.    Marland Kitchen aspirin EC 81 MG tablet Take 324 mg by mouth once.     Marland Kitchen atorvastatin (LIPITOR) 40 MG tablet Take 40 mg by mouth daily at 6 PM.   3  . carvedilol (COREG) 3.125 MG tablet Take 3.125 mg by mouth 2 (two) times daily with a meal.   3  . clopidogrel (PLAVIX) 75 MG tablet Take 75 mg by mouth daily.   3  . furosemide (LASIX) 20 MG tablet Take 20 mg by mouth 2 (two) times daily.   0  . HYDROcodone-acetaminophen (NORCO) 7.5-325 MG tablet Take 1 tablet by mouth 4 (four) times daily as needed. For pain  0  . potassium chloride SA (K-DUR,KLOR-CON) 20 MEQ tablet Take 1 tablet (20 mEq total) by mouth daily. 30 tablet  6  . vitamin C (ASCORBIC ACID) 500 MG tablet Take 500 mg by mouth daily.     No current facility-administered medications on file prior to visit.    No Known Allergies  ROS: See HPI for pertinent positives and negatives.  Physical Examination  Vitals:   10/22/16 0836  BP: 133/75  Pulse: 91  Resp: 18  Temp: 98.3 F (36.8 C)  TempSrc: Oral  SpO2: 99%  Weight: 128 lb 1.6 oz (58.1 kg)  Height: 5\' 7"  (1.702 m)   Body mass index is 20.06 kg/m.  General: A&O x 3, WD, thin male.  Pulmonary: Sym exp, respirations are non labored, good air movt, CTAB, no rales, rhonchi, or wheezing.  Cardiac: RRR, Nl S1, S2, no detected murmur.   Carotid Bruits Right Left   Negative Negative   Aorta is moderately palpable Radial pulses are 2+ palpable and =                          VASCULAR EXAM:                                                                                                                                            LE Pulses Right Left       FEMORAL   palpable   palpable       POPLITEAL   not palpable  not palpable       POSTERIOR TIBIAL   palpable  not  palpable       DORSALIS PEDIS      ANTERIOR TIBIAL  palpable not palpable     Gastrointestinal: soft, NTND, -G/R, - HSM, - masses palpated, - CVAT B.  Musculoskeletal: M/S 5/5 throughout , Extremities without ischemic changes.  Neurologic: CN 2-12 intact, Pain and light touch intact in extremities are intact except, Motor exam as listed above.   Non-Invasive Vascular Imaging  AAA Duplex (10/22/2016)  Previous size: 3.92 cm (Date: 04-02-16)  Current size:  3.83 cm (Date: 10-22-16) Common iliac arteries not well visualized due to overlying bowel gas.   Medical Decision Making  The patient is a 66 y.o. male who presents with asymptomatic AAA with no increase in size. Common iliac arteries not well visualized due to overlying bowel gas.     Based on this patient's exam and diagnostic studies, the patient will follow up in 1 year  with the following studies: AAA duplex.  The patient was counseled re smoking cessation and given several free resources re smoking cessation.   Consideration for repair of AAA would be made when the size is 5.0 cm, growth > 1 cm/yr, and symptomatic status.  I emphasized the importance of maximal medical management including strict control of blood pressure, blood glucose, and lipid levels, antiplatelet agents, obtaining regular exercise, and  cessation of smoking.   The patient was  given information about AAA including signs, symptoms, treatment, and how to minimize the risk of enlargement and rupture of aneurysms.    The patient was advised to call 911 should the patient experience sudden onset abdominal or back pain.   Thank you for allowing [place] to participate in this patient's care.  Charisse March, RN, MSN, FNP-C [facility] [place] Office: [phone]  Clinic Physician: Early  10/22/2016, 8:49 AM

## 2016-10-23 NOTE — Addendum Note (Signed)
Addended by: Burton ApleyPETTY, Dazha Kempa A on: 10/23/2016 11:29 AM   Modules accepted: Orders

## 2016-10-24 ENCOUNTER — Encounter: Payer: Self-pay | Admitting: Adult Health

## 2016-10-24 ENCOUNTER — Ambulatory Visit (INDEPENDENT_AMBULATORY_CARE_PROVIDER_SITE_OTHER): Payer: Medicare Other | Admitting: Adult Health

## 2016-10-24 ENCOUNTER — Encounter: Payer: Self-pay | Admitting: *Deleted

## 2016-10-24 VITALS — BP 116/66 | HR 81 | Ht 67.0 in | Wt 128.0 lb

## 2016-10-24 DIAGNOSIS — R0989 Other specified symptoms and signs involving the circulatory and respiratory systems: Secondary | ICD-10-CM | POA: Diagnosis not present

## 2016-10-24 DIAGNOSIS — I2510 Atherosclerotic heart disease of native coronary artery without angina pectoris: Secondary | ICD-10-CM | POA: Diagnosis not present

## 2016-10-24 DIAGNOSIS — E7849 Other hyperlipidemia: Secondary | ICD-10-CM

## 2016-10-24 DIAGNOSIS — R072 Precordial pain: Secondary | ICD-10-CM | POA: Diagnosis not present

## 2016-10-24 DIAGNOSIS — I10 Essential (primary) hypertension: Secondary | ICD-10-CM

## 2016-10-24 DIAGNOSIS — E784 Other hyperlipidemia: Secondary | ICD-10-CM | POA: Diagnosis not present

## 2016-10-24 MED ORDER — ATORVASTATIN CALCIUM 40 MG PO TABS: 40.0000 mg | ORAL_TABLET | Freq: Every day | ORAL | 3 refills | Status: DC

## 2016-10-24 MED ORDER — NITROGLYCERIN 0.4 MG SL SUBL: 0.4000 mg | SUBLINGUAL_TABLET | SUBLINGUAL | 3 refills | Status: DC | PRN

## 2016-10-24 NOTE — Patient Instructions (Signed)
Your physician recommends that you schedule a follow-up appointment in:  2 Weeks  Your physician has recommended you make the following change in your medication:  Lipitor 40 mg Daily  Nitro 0.4 mg   Your physician has requested that you have en exercise stress myoview. For further information please visit [email or web address] Please follow instruction sheet, as given.  Your physician has requested that you have en exercise stress myoview. For further information please visit [email or web address] Please follow instruction sheet, as given.  Your physician has requested that you have a carotid duplex. This test is an ultrasound of the carotid arteries in your neck. It looks at blood flow through these arteries that supply the brain with blood. Allow one hour for this exam. There are no restrictions or special instructions.    If you need a refill on your cardiac medications before your next appointment, please call your pharmacy.  Thank you for choosing [facility]!

## 2016-10-24 NOTE — Progress Notes (Signed)
Name: [name]    DOB: [birth year]  Age: 66 y.o.  MR#: [id number]       PCP:  Doree Albee, MD      Insurance: Payor: MEDICARE / Plan: MEDICARE PART A AND B / Product Type: *No Product type* /   CC:   No chief complaint on file.   VS Vitals:   10/24/16 1419  Weight: 128 lb (58.1 kg)  Height: 5' 7"  (1.702 m)    Weights Current Weight  10/24/16 128 lb (58.1 kg)  10/22/16 128 lb 1.6 oz (58.1 kg)  10/09/16 129 lb (58.5 kg)    Blood Pressure  BP Readings from Last 3 Encounters:  10/22/16 133/75  10/10/16 105/60  04/02/16 131/71     Admit date:  (Not on file) Last encounter with RMR:  Visit date not found   Allergy Patient has no known allergies.  Current Outpatient Prescriptions  Medication Sig Dispense Refill  . ALPRAZolam (XANAX) 1 MG tablet Take 1 mg by mouth 4 (four) times daily as needed. For anxiety  2  . HYDROcodone-acetaminophen (NORCO) 7.5-325 MG tablet Take 1 tablet by mouth 4 (four) times daily as needed. For pain  0  . amiodarone (PACERONE) 200 MG tablet Take 200 mg by mouth 2 (two) times daily.    Marland Kitchen aspirin EC 81 MG tablet Take 324 mg by mouth once.     Marland Kitchen atorvastatin (LIPITOR) 40 MG tablet Take 40 mg by mouth daily at 6 PM.   3  . carvedilol (COREG) 3.125 MG tablet Take 3.125 mg by mouth 2 (two) times daily with a meal.   3  . clopidogrel (PLAVIX) 75 MG tablet Take 75 mg by mouth daily.   3  . furosemide (LASIX) 20 MG tablet Take 20 mg by mouth 2 (two) times daily.   0  . potassium chloride SA (K-DUR,KLOR-CON) 20 MEQ tablet Take 1 tablet (20 mEq total) by mouth daily. (Patient not taking: Reported on 10/24/2016) 30 tablet 6  . vitamin C (ASCORBIC ACID) 500 MG tablet Take 500 mg by mouth daily.     No current facility-administered medications for this visit.     Discontinued Meds:   There are no discontinued medications.  Patient Active Problem List   Diagnosis Date Noted  . Chest pain 10/09/2016  . CAD of autologous bypass graft 10/09/2016  .  Tobacco abuse 10/09/2016  . Pure hypercholesterolemia 03/21/2015  . 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 AAA (abdominal aortic aneurysm) without rupture (HCC) 03/01/2014    LABS    Component Value Date/Time   NA 136 10/09/2016 2016   NA 137 01/23/2014 1456   NA 139 09/22/2008 1354   K 4.0 10/09/2016 2016   K 4.3 01/23/2014 1456   K 4.3 09/22/2008 1354   CL 106 10/09/2016 2016   CL 97 01/23/2014 1456   CL 105 09/22/2008 1354   CO2 24 10/09/2016 2016   CO2 29 01/23/2014 1456   CO2 28 09/22/2008 1354   GLUCOSE 79 10/09/2016 2016   GLUCOSE 116 (H) 01/23/2014 1456   GLUCOSE 77 09/22/2008 1354   BUN 12 10/09/2016 2016   BUN 8 01/23/2014 1456   BUN 8 09/22/2008 1354   CREATININE 0.90 10/09/2016 2016   CREATININE 0.85 01/23/2014 1456   CREATININE 0.88 09/22/2008 1354   CALCIUM 9.0 10/09/2016 2016   CALCIUM 9.7 01/23/2014 1456   CALCIUM 9.3 09/22/2008 1354   GFRNONAA >60 10/09/2016 2016   GFRNONAA 89 (  L) 01/23/2014 1456   GFRNONAA >60 09/22/2008 1354   GFRAA >60 10/09/2016 2016   GFRAA >90 01/23/2014 1456   GFRAA  09/22/2008 1354    >60        The eGFR has been calculated using the MDRD equation. This calculation has not been validated in all clinical   CMP     Component Value Date/Time   NA 136 10/09/2016 2016   K 4.0 10/09/2016 2016   CL 106 10/09/2016 2016   CO2 24 10/09/2016 2016   GLUCOSE 79 10/09/2016 2016   BUN 12 10/09/2016 2016   CREATININE 0.90 10/09/2016 2016   CALCIUM 9.0 10/09/2016 2016   PROT 7.6 01/23/2014 1456   ALBUMIN 4.0 01/23/2014 1456   AST 20 01/23/2014 1456   ALT 8 01/23/2014 1456   ALKPHOS 80 01/23/2014 1456   BILITOT 0.5 01/23/2014 1456   GFRNONAA >60 10/09/2016 2016   GFRAA >60 10/09/2016 2016       Component Value Date/Time   WBC 9.6 10/09/2016 2016   WBC 10.0 01/23/2014 1456   WBC 8.0 09/22/2008 1354   HGB 13.7 10/09/2016 2016   HGB 15.2 01/23/2014 1456   HGB 14.4 09/22/2008 1354   HCT 40.1 10/09/2016 2016    HCT 45.0 01/23/2014 1456   HCT 41.7 09/22/2008 1354   MCV 101.0 (H) 10/09/2016 2016   MCV 98.0 01/23/2014 1456   MCV 104.0 (H) 09/22/2008 1354    Lipid Panel  No results found for: CHOL, TRIG, HDL, CHOLHDL, VLDL, LDLCALC, LDLDIRECT  ABG    Component Value Date/Time   PHART 7.388 01/08/2011 1100   PCO2ART 39.7 01/08/2011 1100   PO2ART 93.3 01/08/2011 1100   HCO3 23.4 01/08/2011 1100   TCO2 20.2 01/08/2011 1100   ACIDBASEDEF 0.9 01/08/2011 1100   O2SAT 97.7 01/08/2011 1100     No results found for: TSH BNP (last 3 results) No results for input(s): BNP in the last 8760 hours.  ProBNP (last 3 results) No results for input(s): PROBNP in the last 8760 hours.  Cardiac Panel (last 3 results) No results for input(s): CKTOTAL, CKMB, TROPONINI, RELINDX in the last 72 hours.  Iron/TIBC/Ferritin/ %Sat No results found for: IRON, TIBC, FERRITIN, IRONPCTSAT   EKG Orders placed or performed during the hospital encounter of 10/09/16  . EKG 12-Lead  . EKG 12-Lead  . EKG 12-Lead  . EKG 12-Lead     Prior Assessment and Plan Problem List as of 10/24/2016 Reviewed: 10/22/2016  9:08 AM by Viann Fish, NP     Cardiovascular and Mediastinum   AAA (abdominal aortic aneurysm) without rupture (HCC)   CAD of autologous bypass graft     Other   S/P CABG (coronary artery bypass graft)   Pure hypercholesterolemia   Chest pain   Tobacco abuse       Imaging: Dg Chest 2 View  Result Date: 10/09/2016 CLINICAL DATA:  Chest heaviness and pressure EXAM: CHEST  2 VIEW COMPARISON:  01/23/2014 FINDINGS: Post sternotomy changes. Multiple leads obscure portions of the chest. The lungs are hyperinflated. Stable calcified right middle lobe nodule. Emphysematous changes are present within the upper lobes. No pneumothorax. IMPRESSION: 1. Hyperinflation with emphysematous changes in the upper lobes. No acute infiltrate or edema. Electronically Signed   By: Donavan Foil M.D.   On: 10/09/2016 20:47

## 2016-10-24 NOTE — Progress Notes (Signed)
Cardiology Office Note   Date:  10/24/2016   ID:  [name], DOB 03-02-1951, MRN [id number]  PCP:  Wilson SingerGOSRANI,NIMISH C, MD  Cardiologist: Inis SizerKoneswaran/  Zyron Deeley, NP   Chief Complaint  Patient presents with  . Coronary Artery Disease  . Cardiac Valve Problem  . AAA    History of Present Illness: Sean Daubndrew R Boyum is a 66 y.o. male who presents for ongoing assessment and management for multivessel coronary artery disease, history of CABG with SVG to RCA and LIMA to LAD, tricuspid valve repair with a 26 mm [employer] M3 angioplasty ring, abdominal aortic aneurysm followed by vascular surgery, hypertension, hyperlipidemia, and COPD.  Coronary angiography on 02/17/15 demonstrated the left main coronary artery to have mild 30-40% stenosis, 100% LAD stenosis with right to left collaterals and faint left to left collaterals, small nondominant left circumflex, small to medium caliber first obtuse marginal branch with 20% ostial and proximal stenosis. The RCA was noted to have diffuse disease.  On last office visit the patient was taken off of amiodarone which was started for postoperative atrial fibrillation. Lisinopril 5 mg daily was added in the setting of ischemic cardiomyopathy. He was to follow-up in 6 months. The patient was seen in the hospital in the setting of recurrent chest pain, reportedly had stopped taking his medications. The patient ended up signing out AMA, he was ruled out for ACS with 3 negative troponin levels. He was seen by vascular on 10/22/2016. He was advised to take his medications as directed, and advised to stop smoking.  The patient states that he has not been followed regularly by cardiology as he has called but no appointments for made. He is here with his daughter. The patient continues to smoke heavily, 3 packs a day. He only takes pain medication and Ativan. He continues to have recurrent discomfort in his chest. He has not had any cardiac testing since  2016 when he had a cardiac catheterization.  Past Medical History:  Diagnosis Date  . AAA (abdominal aortic aneurysm) (HCC)   . Anxiety   . Arthritis   . COPD (chronic obstructive pulmonary disease) (HCC)   . MI (myocardial infarction)   . Pulmonary nodule    benign    Past Surgical History:  Procedure Laterality Date  . AORTIC VALVE REPLACEMENT    . CORONARY ARTERY BYPASS GRAFT    . INGUINAL HERNIA REPAIR Bilateral   . VALVE REPLACEMENT       Current Outpatient Prescriptions  Medication Sig Dispense Refill  . ALPRAZolam (XANAX) 1 MG tablet Take 1 mg by mouth 4 (four) times daily as needed. For anxiety  2  . HYDROcodone-acetaminophen (NORCO) 7.5-325 MG tablet Take 1 tablet by mouth 4 (four) times daily as needed. For pain  0  . amiodarone (PACERONE) 200 MG tablet Take 200 mg by mouth 2 (two) times daily.    Marland Kitchen. aspirin EC 81 MG tablet Take 324 mg by mouth once.     Marland Kitchen. atorvastatin (LIPITOR) 40 MG tablet Take 1 tablet (40 mg total) by mouth daily. 90 tablet 3  . carvedilol (COREG) 3.125 MG tablet Take 3.125 mg by mouth 2 (two) times daily with a meal.   3  . clopidogrel (PLAVIX) 75 MG tablet Take 75 mg by mouth daily.   3  . furosemide (LASIX) 20 MG tablet Take 20 mg by mouth 2 (two) times daily.   0  . nitroGLYCERIN (NITROSTAT) 0.4 MG SL tablet Place 1 tablet (0.4  mg total) under the tongue every 5 (five) minutes as needed for chest pain. 90 tablet 3  . potassium chloride SA (K-DUR,KLOR-CON) 20 MEQ tablet Take 1 tablet (20 mEq total) by mouth daily. (Patient not taking: Reported on 10/24/2016) 30 tablet 6  . vitamin C (ASCORBIC ACID) 500 MG tablet Take 500 mg by mouth daily.     No current facility-administered medications for this visit.     Allergies:   Patient has no known allergies.    Social History:  The patient  reports that he has been smoking Cigarettes.  He has a 180.00 pack-year smoking history. He has never used smokeless tobacco. He reports that he drinks about 0.6  oz of alcohol per week . He reports that he does not use drugs.   Family History:  The patient's family history includes Heart disease in his father.    ROS: All other systems are reviewed and negative. Unless otherwise mentioned in H&P    PHYSICAL EXAM: VS:  BP 116/66   Pulse 81   Ht 5\' 7"  (1.702 m)   Wt 128 lb (58.1 kg)   SpO2 97%   BMI 20.05 kg/m  , BMI Body mass index is 20.05 kg/m. GEN: Well nourished, well developed, in no acute distress  HEENT: normal  Neck: no JVD, left carotid  bruit,, or masses Cardiac: RRR; no murmurs, rubs, or gallops,no edema  Respiratory:  Clear to auscultation bilaterally, normal work of breathing GI: soft, nontender, nondistended, + BS MS: no deformity or atrophy  Skin: warm and dry, no rash.Ruddy complexion  Neuro:  Strength and sensation are intact Psych: euthymic mood, full affect   Recent Labs: 10/09/2016: BUN 12; Creatinine, Ser 0.90; Hemoglobin 13.7; Platelets 159; Potassium 4.0; Sodium 136     Wt Readings from Last 3 Encounters:  10/24/16 128 lb (58.1 kg)  10/22/16 128 lb 1.6 oz (58.1 kg)  10/09/16 129 lb (58.5 kg)      ASSESSMENT AND PLAN:  1.  CAD:  Know hx of multivessel disease with CABG. He continues to have angina. He is no longer taking statin, beta blocker, Plavix, aspirin, or Lasix. He states that he was never able to get a follow-up appointment and therefore no refills were prescribed. I will give him a prescription for nitroglycerin sublingual. I will schedule a stress test for evaluation of progressive CAD with known history of multivessel disease and progressive RCA disease per cardiac catheterization in 2016, and ongoing angina.  2. Hypercholesterolemia:. I will restart him on Lipitor 40 mg daily. We'll check fasting lipids and LFTs.  3. Ongoing tobacco abuse: Admits to smoking 3 packs a day. He is not inclined to quit at this time. I have given him strong recommendations and explanations on the need to quit smoking  with known history of CAD. He verbalizes understanding and is willing to take the risks.  4. Carotid bruit: The patient has a left carotid bruit, and therefore we'll have a Doppler ultrasound of the carotid arteries to evaluate for carotid artery disease.  5. Hypertension: Blood pressures currently well-controlled. Echocardiogram will be completed.  6. Valvular Heart Disease: Repeat echo  7. AAA: Followed by Vascular Medicine   Current medicines are reviewed at length with the patient today.    Labs/ tests ordered today include:   Orders Placed This Encounter  Procedures  . NM Myocar Multi W/Spect W/Wall Motion / EF  . US Carotid Bilateral  . Lipid Profile  . ECHOCARDIOGRAM COMPLETE  Disposition:   FU with Cardiology in 2 weeks to discuss test results and plan further treatment regimen. Signed, Joni Reining, NP  10/24/2016 3:12 PM    [facility] [address] [place], [place] [address] Phone: [phone]; Fax: [phone]

## 2016-11-01 ENCOUNTER — Ambulatory Visit (HOSPITAL_COMMUNITY)
Admission: RE | Admit: 2016-11-01 | Discharge: 2016-11-01 | Disposition: A | Discharge status: Home / Self Care | Payer: Medicare Other | Source: Ambulatory Visit | Attending: Adult Health | Admitting: Adult Health

## 2016-11-01 ENCOUNTER — Encounter (INDEPENDENT_AMBULATORY_CARE_PROVIDER_SITE_OTHER)
Admission: RE | Admit: 2016-11-01 | Discharge: 2016-11-01 | Disposition: A | Discharge status: Home / Self Care | Payer: Medicare Other | Source: Ambulatory Visit | Attending: Adult Health | Admitting: Adult Health

## 2016-11-01 ENCOUNTER — Encounter (HOSPITAL_COMMUNITY): Payer: Self-pay

## 2016-11-01 ENCOUNTER — Inpatient Hospital Stay (HOSPITAL_COMMUNITY): Admission: RE | Admit: 2016-11-01 | Payer: Self-pay | Source: Ambulatory Visit

## 2016-11-01 DIAGNOSIS — I714 Abdominal aortic aneurysm, without rupture: Secondary | ICD-10-CM | POA: Diagnosis not present

## 2016-11-01 DIAGNOSIS — R0989 Other specified symptoms and signs involving the circulatory and respiratory systems: Secondary | ICD-10-CM | POA: Diagnosis not present

## 2016-11-01 DIAGNOSIS — I340 Nonrheumatic mitral (valve) insufficiency: Secondary | ICD-10-CM | POA: Insufficient documentation

## 2016-11-01 DIAGNOSIS — I6523 Occlusion and stenosis of bilateral carotid arteries: Secondary | ICD-10-CM | POA: Diagnosis not present

## 2016-11-01 DIAGNOSIS — R072 Precordial pain: Secondary | ICD-10-CM | POA: Insufficient documentation

## 2016-11-01 DIAGNOSIS — I252 Old myocardial infarction: Secondary | ICD-10-CM | POA: Insufficient documentation

## 2016-11-01 DIAGNOSIS — I2510 Atherosclerotic heart disease of native coronary artery without angina pectoris: Secondary | ICD-10-CM | POA: Diagnosis not present

## 2016-11-01 DIAGNOSIS — Z951 Presence of aortocoronary bypass graft: Secondary | ICD-10-CM | POA: Insufficient documentation

## 2016-11-01 DIAGNOSIS — J449 Chronic obstructive pulmonary disease, unspecified: Secondary | ICD-10-CM | POA: Diagnosis not present

## 2016-11-01 LAB — NM MYOCAR MULTI W/SPECT W/WALL MOTION / EF
CHL CUP NUCLEAR SDS: 4
CHL CUP RESTING HR STRESS: 46 {beats}/min
CSEPEDS: 0 s
CSEPPHR: 142 {beats}/min
Estimated workload: 10.1 METS
Exercise duration (min): 9 min
LVDIAVOL: 113 mL (ref 62–150)
LVSYSVOL: 62 mL
MPHR: 153 {beats}/min
Percent HR: 92 %
RATE: 0
RPE: 13
SRS: 4
SSS: 8
TID: 0.93

## 2016-11-01 IMAGING — NM NM MYOCAR MULTI W/SPECT W/WALL MOTION & EF
2 series · 12 of 12 positions shown · non-contrast
Comparison: none

[Series 1: rest · 8.28mm/px · 6 of 64 frames shown]
[frame 6/64]
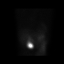
[frame 16/64]
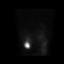
[frame 27/64]
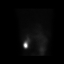
[frame 38/64]
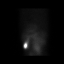
[frame 48/64]
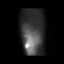
[frame 59/64]
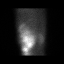

[Series 3: stress gated · 8.28mm/px · 6 of 64 frames shown]
[frame 6/64]
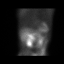
[frame 16/64]
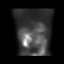
[frame 27/64]
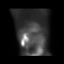
[frame 38/64]
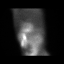
[frame 48/64]
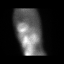
[frame 59/64]
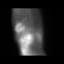

[12 of 12 positions shown; findings below may reference images not displayed]

Canned report from images found in remote index.

Refer to host system for actual result text.

## 2016-11-01 MED ORDER — REGADENOSON 0.4 MG/5ML IV SOLN
INTRAVENOUS | Status: AC
  Filled 2016-11-01: qty 5

## 2016-11-01 MED ORDER — TECHNETIUM TC 99M TETROFOSMIN IV KIT
10.0000 | PACK | Freq: Once | INTRAVENOUS | Status: AC | PRN
  Administered 2016-11-01: 10.1 via INTRAVENOUS

## 2016-11-01 MED ORDER — SODIUM CHLORIDE 0.9% FLUSH
INTRAVENOUS | Status: AC
  Administered 2016-11-01: 20 mL via INTRAVENOUS
  Filled 2016-11-01: qty 10

## 2016-11-01 MED ORDER — TECHNETIUM TC 99M TETROFOSMIN IV KIT
30.0000 | PACK | Freq: Once | INTRAVENOUS | Status: AC | PRN
  Administered 2016-11-01: 30 via INTRAVENOUS

## 2016-11-01 NOTE — Progress Notes (Signed)
*  PRELIMINARY RESULTS* Echocardiogram 2D Echocardiogram has been performed.  Jeryl Columbialliott, Hao Dion 11/01/2016, 10:16 AM

## 2016-11-04 ENCOUNTER — Telehealth: Payer: Self-pay | Admitting: *Deleted

## 2016-11-04 NOTE — Telephone Encounter (Signed)
Called patient with test results. No answer. Left message to call back.  

## 2016-11-04 NOTE — Telephone Encounter (Signed)
-----   Message from Jodelle GrossKathryn M Lawrence, NP sent at 11/01/2016 12:52 PM EST ----- No significant blockage in the carotid arteries. Will discuss this and all test results with next appointment

## 2016-11-04 NOTE — Telephone Encounter (Signed)
-----   Message from Jodelle GrossKathryn M Lawrence, NP sent at 11/04/2016  7:30 AM EST ----- Will need to be seen to discuss cardiac cath, as this was abnormal Not sure when next appt is scheduled.

## 2016-11-04 NOTE — Telephone Encounter (Signed)
-----   Message from Jodelle GrossKathryn M Lawrence, NP sent at 11/01/2016 12:51 PM EST ----- Reviewed. Decreased EF. Will discuss with him on followup. Having stress test as well.

## 2016-11-08 ENCOUNTER — Encounter: Payer: Self-pay | Admitting: Adult Health

## 2016-11-08 ENCOUNTER — Other Ambulatory Visit (HOSPITAL_COMMUNITY)
Admission: RE | Admit: 2016-11-08 | Discharge: 2016-11-08 | Disposition: A | Discharge status: Home / Self Care | Payer: Medicare Other | Source: Ambulatory Visit | Attending: Adult Health | Admitting: Adult Health

## 2016-11-08 ENCOUNTER — Other Ambulatory Visit: Payer: Self-pay | Admitting: Adult Health

## 2016-11-08 ENCOUNTER — Ambulatory Visit (INDEPENDENT_AMBULATORY_CARE_PROVIDER_SITE_OTHER): Payer: Medicare Other | Admitting: Adult Health

## 2016-11-08 VITALS — BP 108/62 | HR 72 | Ht 67.0 in | Wt 129.0 lb

## 2016-11-08 DIAGNOSIS — Z01818 Encounter for other preprocedural examination: Secondary | ICD-10-CM | POA: Insufficient documentation

## 2016-11-08 DIAGNOSIS — J410 Simple chronic bronchitis: Secondary | ICD-10-CM

## 2016-11-08 DIAGNOSIS — I2510 Atherosclerotic heart disease of native coronary artery without angina pectoris: Secondary | ICD-10-CM | POA: Diagnosis not present

## 2016-11-08 DIAGNOSIS — Z720 Tobacco use: Secondary | ICD-10-CM | POA: Diagnosis not present

## 2016-11-08 DIAGNOSIS — I714 Abdominal aortic aneurysm, without rupture, unspecified: Secondary | ICD-10-CM

## 2016-11-08 LAB — BASIC METABOLIC PANEL
ANION GAP: 7 (ref 5–15)
BUN: 13 mg/dL (ref 6–20)
CALCIUM: 9.1 mg/dL (ref 8.9–10.3)
CO2: 27 mmol/L (ref 22–32)
Chloride: 103 mmol/L (ref 101–111)
Creatinine, Ser: 0.94 mg/dL (ref 0.61–1.24)
GFR calc non Af Amer: >60 mL/min (ref >60)
Glucose, Bld: 114 mg/dL — ABNORMAL HIGH (ref 65–99)
Potassium: 4.1 mmol/L (ref 3.5–5.1)
Sodium: 137 mmol/L (ref 135–145)

## 2016-11-08 LAB — CBC WITH DIFFERENTIAL/PLATELET
BASOS ABS: 0 10*3/uL (ref 0.0–0.1)
BASOS PCT: 0 %
Eosinophils Absolute: 0.1 10*3/uL (ref 0.0–0.7)
Eosinophils Relative: 2 %
HEMATOCRIT: 41 % (ref 39.0–52.0)
HEMOGLOBIN: 14.1 g/dL (ref 13.0–17.0)
Lymphocytes Relative: 44 %
Lymphs Abs: 3.3 10*3/uL (ref 0.7–4.0)
MCH: 34.6 pg — ABNORMAL HIGH (ref 26.0–34.0)
MCHC: 34.4 g/dL (ref 30.0–36.0)
MCV: 100.7 fL — ABNORMAL HIGH (ref 78.0–100.0)
MONO ABS: 0.7 10*3/uL (ref 0.1–1.0)
Monocytes Relative: 10 %
NEUTROS ABS: 3.3 10*3/uL (ref 1.7–7.7)
NEUTROS PCT: 44 %
Platelets: 191 10*3/uL (ref 150–400)
RBC: 4.07 MIL/uL — ABNORMAL LOW (ref 4.22–5.81)
RDW: 13.7 % (ref 11.5–15.5)
WBC: 7.5 10*3/uL (ref 4.0–10.5)

## 2016-11-08 LAB — PROTIME-INR
INR: 0.97
PROTHROMBIN TIME: 12.9 s (ref 11.4–15.2)

## 2016-11-08 NOTE — Progress Notes (Signed)
Name: [name]    DOB: Dec 05, 1950  Age: 66 y.o.  MR#: [id number]       PCP:  Doree Albee, MD      Insurance: Payor: MEDICARE / Plan: MEDICARE PART A AND B / Product Type: *No Product type* /   CC:   No chief complaint on file.   VS Vitals:   11/08/16 1258  BP: 108/62  Pulse: 72  SpO2: 93%  Weight: 129 lb (58.5 kg)  Height: _0  (1.702 m)    Weights Current Weight  11/08/16 129 lb (58.5 kg)  10/24/16 128 lb (58.1 kg)  10/22/16 128 lb 1.6 oz (58.1 kg)    Blood Pressure  BP Readings from Last 3 Encounters:  11/08/16 108/62  10/24/16 116/66  10/22/16 133/75     Admit date:  (Not on file) Last encounter with RMR:  10/24/2016   Allergy Patient has no known allergies.  Current Outpatient Prescriptions  Medication Sig Dispense Refill  . ALPRAZolam (XANAX) 1 MG tablet Take 1 mg by mouth 4 (four) times daily as needed. For anxiety  2  . amiodarone (PACERONE) 200 MG tablet Take 200 mg by mouth 2 (two) times daily.    Marland Kitchen aspirin EC 81 MG tablet Take 324 mg by mouth once.     Marland Kitchen atorvastatin (LIPITOR) 40 MG tablet Take 1 tablet (40 mg total) by mouth daily. 90 tablet 3  . carvedilol (COREG) 3.125 MG tablet Take 3.125 mg by mouth 2 (two) times daily with a meal.   3  . clopidogrel (PLAVIX) 75 MG tablet Take 75 mg by mouth daily.   3  . furosemide (LASIX) 20 MG tablet Take 20 mg by mouth 2 (two) times daily.   0  . HYDROcodone-acetaminophen (NORCO) 7.5-325 MG tablet Take 1 tablet by mouth 4 (four) times daily as needed. For pain  0  . nitroGLYCERIN (NITROSTAT) 0.4 MG SL tablet Place 1 tablet (0.4 mg total) under the tongue every 5 (five) minutes as needed for chest pain. 90 tablet 3  . potassium chloride SA (K-DUR,KLOR-CON) 20 MEQ tablet Take 1 tablet (20 mEq total) by mouth daily. 30 tablet 6  . vitamin C (ASCORBIC ACID) 500 MG tablet Take 500 mg by mouth daily.     No current facility-administered medications for this visit.     Discontinued Meds:   There are no  discontinued medications.  Patient Active Problem List   Diagnosis Date Noted  . Chest pain 10/09/2016  . CAD of autologous bypass graft 10/09/2016  . Tobacco abuse 10/09/2016  . Pure hypercholesterolemia 03/21/2015  . 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 AAA (abdominal aortic aneurysm) without rupture (HCC) 03/01/2014    LABS    Component Value Date/Time   NA 136 10/09/2016 2016   NA 137 01/23/2014 1456   NA 139 09/22/2008 1354   K 4.0 10/09/2016 2016   K 4.3 01/23/2014 1456   K 4.3 09/22/2008 1354   CL 106 10/09/2016 2016   CL 97 01/23/2014 1456   CL 105 09/22/2008 1354   CO2 24 10/09/2016 2016   CO2 29 01/23/2014 1456   CO2 28 09/22/2008 1354   GLUCOSE 79 10/09/2016 2016   GLUCOSE 116 (H) 01/23/2014 1456   GLUCOSE 77 09/22/2008 1354   BUN 12 10/09/2016 2016   BUN 8 01/23/2014 1456   BUN 8 09/22/2008 1354   CREATININE 0.90 10/09/2016 2016   CREATININE 0.85 01/23/2014 1456   CREATININE 0.88 09/22/2008  1354   CALCIUM 9.0 10/09/2016 2016   CALCIUM 9.7 01/23/2014 1456   CALCIUM 9.3 09/22/2008 1354   GFRNONAA >60 10/09/2016 2016   GFRNONAA 89 (L) 01/23/2014 1456   GFRNONAA >60 09/22/2008 1354   GFRAA >60 10/09/2016 2016   GFRAA >90 01/23/2014 1456   GFRAA  09/22/2008 1354    >60        The eGFR has been calculated using the MDRD equation. This calculation has not been validated in all clinical   CMP     Component Value Date/Time   NA 136 10/09/2016 2016   K 4.0 10/09/2016 2016   CL 106 10/09/2016 2016   CO2 24 10/09/2016 2016   GLUCOSE 79 10/09/2016 2016   BUN 12 10/09/2016 2016   CREATININE 0.90 10/09/2016 2016   CALCIUM 9.0 10/09/2016 2016   PROT 7.6 01/23/2014 1456   ALBUMIN 4.0 01/23/2014 1456   AST 20 01/23/2014 1456   ALT 8 01/23/2014 1456   ALKPHOS 80 01/23/2014 1456   BILITOT 0.5 01/23/2014 1456   GFRNONAA >60 10/09/2016 2016   GFRAA >60 10/09/2016 2016       Component Value Date/Time   WBC 9.6 10/09/2016 2016   WBC 10.0  01/23/2014 1456   WBC 8.0 09/22/2008 1354   HGB 13.7 10/09/2016 2016   HGB 15.2 01/23/2014 1456   HGB 14.4 09/22/2008 1354   HCT 40.1 10/09/2016 2016   HCT 45.0 01/23/2014 1456   HCT 41.7 09/22/2008 1354   MCV 101.0 (H) 10/09/2016 2016   MCV 98.0 01/23/2014 1456   MCV 104.0 (H) 09/22/2008 1354    Lipid Panel  No results found for: CHOL, TRIG, HDL, CHOLHDL, VLDL, LDLCALC, LDLDIRECT  ABG    Component Value Date/Time   PHART 7.388 01/08/2011 1100   PCO2ART 39.7 01/08/2011 1100   PO2ART 93.3 01/08/2011 1100   HCO3 23.4 01/08/2011 1100   TCO2 20.2 01/08/2011 1100   ACIDBASEDEF 0.9 01/08/2011 1100   O2SAT 97.7 01/08/2011 1100     No results found for: TSH BNP (last 3 results) No results for input(s): BNP in the last 8760 hours.  ProBNP (last 3 results) No results for input(s): PROBNP in the last 8760 hours.  Cardiac Panel (last 3 results) No results for input(s): CKTOTAL, CKMB, TROPONINI, RELINDX in the last 72 hours.  Iron/TIBC/Ferritin/ %Sat No results found for: IRON, TIBC, FERRITIN, IRONPCTSAT   EKG Orders placed or performed during the hospital encounter of 10/09/16  . EKG 12-Lead  . EKG 12-Lead  . EKG 12-Lead  . EKG 12-Lead     Prior Assessment and Plan Problem List as of 11/08/2016 Reviewed: 10/24/2016  3:55 PM by Jory Sims, NP     Cardiovascular and Mediastinum   AAA (abdominal aortic aneurysm) without rupture ([place])   CAD of autologous bypass graft     Other   S/P CABG (coronary artery bypass graft)   Pure hypercholesterolemia   Chest pain   Tobacco abuse       Imaging: Dg Chest 2 View  Result Date: 10/09/2016 CLINICAL DATA:  Chest heaviness and pressure EXAM: CHEST  2 VIEW COMPARISON:  01/23/2014 FINDINGS: Post sternotomy changes. Multiple leads obscure portions of the chest. The lungs are hyperinflated. Stable calcified right middle lobe nodule. Emphysematous changes are present within the upper lobes. No pneumothorax. IMPRESSION: 1.  Hyperinflation with emphysematous changes in the upper lobes. No acute infiltrate or edema. Electronically Signed   By: Donavan Foil M.D.   On: 10/09/2016 20:47  US Carotid Bilateral  Result Date: 11/01/2016 CLINICAL DATA:  Left neck bruit EXAM: BILATERAL CAROTID DUPLEX ULTRASOUND TECHNIQUE: Pearline Cables scale imaging, color Doppler and duplex ultrasound were performed of bilateral carotid and vertebral arteries in the neck. COMPARISON:  None. FINDINGS: Criteria: Quantification of carotid stenosis is based on velocity parameters that correlate the residual internal carotid diameter with NASCET-based stenosis levels, using the diameter of the distal internal carotid lumen as the denominator for stenosis measurement. The following velocity measurements were obtained: RIGHT ICA:  103 cm/sec CCA:  89 cm/sec SYSTOLIC ICA/CCA RATIO:  1.2 DIASTOLIC ICA/CCA RATIO:  1.1 ECA:  96 cm/sec LEFT ICA:  102 cm/sec CCA:  277 cm/sec SYSTOLIC ICA/CCA RATIO:  1.0 DIASTOLIC ICA/CCA RATIO:  1.2 ECA:  202 cm/sec RIGHT CAROTID ARTERY: Mild calcified plaque in the bulb. Low resistance internal carotid Doppler pattern. There is some irregular plaque at the origin of the right external carotid artery. RIGHT VERTEBRAL ARTERY:  Antegrade. LEFT CAROTID ARTERY: Mild calcified plaque in the bulb. There is some irregular plaque at the origin of the external carotid artery. There is an elevated velocity in the left external carotid artery. LEFT VERTEBRAL ARTERY:  Antegrade. IMPRESSION: Less than 50% stenosis in the right and left internal carotid arteries. There is calcified plaque in the bilateral bulbs. There is an elevated velocity in the left external carotid artery likely the cause of the bruit. Electronically Signed   By: Marybelle Killings M.D.   On: 11/01/2016 11:44   Nm Myocar Multi W/spect W/wall Motion / Ef  Result Date: 11/01/2016  Blood pressure demonstrated a normal response to exercise.  Defect 1: There is a medium defect of severe  severity present in the basal anteroseptal, basal inferoseptal, basal inferior, mid inferoseptal, mid inferior and apical anterior location.  Findings consistent with prior myocardial infarction with peri-infarct ischemia primarily in the anteroseptal wall and septum.  This is an intermediate risk study.  Nuclear stress EF: 46%.

## 2016-11-08 NOTE — Progress Notes (Signed)
Cardiology Office Note   Date:  11/08/2016   ID:  [name], DOB 1951-03-08, MRN [id number]  PCP:  Wilson Singer, MD  Cardiologist: Inis Sizer, NP   Chief Complaint  Patient presents with  . Shortness of Breath  . Coronary Artery Disease    Hx of CABG      History of Present Illness: [name] is a 66 y.o. male who presents for ongoing assessment and management of multivessel coronary artery disease. He has a history of CABG with SVG to RCA and LIMA to LAD, (2015)tricuspid valve repair with a 26 mm [employer] M3 angioplasty ring, abdominal aortic aneurysm followed by vascular surgery, hypertension, hyperlipidemia, and COPD.  On last office visit on 10/24/2016, the patient was being reestablished in the office as he had not been followed by cardiology regularly (for over 2 years). He was continued have episodes of angina. He had not been taking any medications. He was scheduled for a stress test for evaluation of progressive coronary artery disease, he was restarted on Lipitor 40 mg daily, Doppler ultrasound was completed due to audible bilateral bruits, and he was counseled urgently on tobacco cessation. He was restarted on all of his medications to include Plavix, ASA, Lipitor, Amiodarone, and Coreg.   Stress MPI: 12/04/2016   Blood pressure demonstrated a normal response to exercise.  Defect 1: There is a medium defect of severe severity present in the basal anteroseptal, basal inferoseptal, basal inferior, mid inferoseptal, mid inferior and apical anterior location.  Findings consistent with prior myocardial infarction with peri-infarct ischemia primarily in the anteroseptal wall and septum.  This is an intermediate risk study.  Nuclear stress EF: 46%.   Echocardiogram 11/01/2016 Left ventricle: The cavity size was normal. Wall thickness was   increased in a pattern of mild LVH. Systolic function was mildly   reduced. The estimated  ejection fraction was 45%. - Regional wall motion abnormality: Moderate hypokinesis of the   mid-apical anterior, basal anteroseptal, basal-mid inferoseptal,   and apical septal myocardium; mild hypokinesis of the mid   anteroseptal and apical myocardium. - Aortic valve: Mildly thickened, moderately calcified leaflets.   Morphologically, there may be very mild aortic stenosis. - Mitral valve: Mildly calcified annulus. Normal thickness leaflets   . There was mild regurgitation. - Left atrium: The atrium was mildly dilated. - Right ventricle: Systolic function was mildly reduced. 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 There was trivial   regurgitation. - Pulmonary arteries: PA peak pressure: 35 mm Hg (S). - Systemic veins: Dilated IVC with normal respiratory variation.   Estimated CVP 8 mmHg.  Carotid Doppler 11/01/16 IMPRESSION: Less than 50% stenosis in the right and left internal carotid arteries. There is calcified plaque in the bilateral bulbs. There is an elevated velocity in the left external carotid artery likely the cause of the bruit.  CT of Abdomen 01/23/2014 IMPRESSION: 1. Emphysematous changes again noted lung bases. Stable nodules in right middle lobe. 2. No hydronephrosis or hydroureter. 3. No small bowel obstruction. 4. There is moderate distension of proximal colon with liquid and stool. Abundant stool noted in proximal sigmoid colon. No definite colonic obstruction. The distal sigmoid colon and rectum are empty partially collapsed. 5. No pericecal inflammation.  Normal appendix. 6. 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   Past Medical History:  Diagnosis Date  . AAA (abdominal aortic aneurysm) (HCC)   . Anxiety   . Arthritis   . COPD (chronic obstructive pulmonary disease) (HCC)   . MI (myocardial infarction)    . Pulmonary nodule    benign    Past Surgical History:  Procedure Laterality Date  . AORTIC VALVE REPLACEMENT    . CORONARY ARTERY BYPASS GRAFT    . INGUINAL HERNIA REPAIR Bilateral   . VALVE REPLACEMENT       Current Outpatient Prescriptions  Medication Sig Dispense Refill  . ALPRAZolam (XANAX) 1 MG tablet Take 1 mg by mouth 4 (four) times daily as needed. For anxiety  2  . amiodarone (PACERONE) 200 MG tablet Take 200 mg by mouth 2 (two) times daily.    Marland Kitchen aspirin EC 81 MG tablet Take 324 mg by mouth once.     Marland Kitchen atorvastatin (LIPITOR) 40 MG tablet Take 1 tablet (40 mg total) by mouth daily. 90 tablet 3  . carvedilol (COREG) 3.125 MG tablet Take 3.125 mg by mouth 2 (two) times daily with a meal.   3  . clopidogrel (PLAVIX) 75 MG tablet Take 75 mg by mouth daily.   3  . furosemide (LASIX) 20 MG tablet Take 20 mg by mouth 2 (two) times daily.   0  . HYDROcodone-acetaminophen (NORCO) 7.5-325 MG tablet Take 1 tablet by mouth 4 (four) times daily as needed. For pain  0  . nitroGLYCERIN (NITROSTAT) 0.4 MG SL tablet Place 1 tablet (0.4 mg total) under the tongue every 5 (five) minutes as needed for chest pain. 90 tablet 3  . potassium chloride SA (K-DUR,KLOR-CON) 20 MEQ tablet Take 1 tablet (20 mEq total) by mouth daily. 30 tablet 6  . vitamin C (ASCORBIC ACID) 500 MG tablet Take 500 mg by mouth daily.     No current facility-administered medications for this visit.     Allergies:   Patient has no known allergies.    Social History:  The patient  reports that he has been smoking Cigarettes.  He has a 180.00 pack-year smoking history. He has never used smokeless tobacco. He reports that he drinks about 0.6 oz of alcohol per week . He reports that he does not use drugs.   Family History:  The patient's family history includes Heart disease in his father.    ROS: All other systems are reviewed and negative. Unless otherwise mentioned in H&P    PHYSICAL EXAM: VS:  BP 108/62   Pulse  72   Ht 5\' 7"  (1.702 m)   Wt 129 lb (58.5 kg)   SpO2 93%   BMI 20.20 kg/m  , BMI Body mass index is 20.2 kg/m. GEN: Well nourished, well developed, in no acute distress  HEENT: normal  Neck: no JVD, left carotid bruit. no, or masses Cardiac: RRR; no murmurs, rubs, or gallops,no edema  Respiratory:  Clear to auscultation bilaterally, with scant crackles in the bases.  GI: soft, nontender, nondistended, + BS MS: no deformity or atrophy  Skin: warm and dry, no rash Neuro:  Strength and sensation are intact Psych: euthymic mood, full affect   Wt Readings from Last 3 Encounters:  11/08/16 129 lb (58.5 kg)  10/24/16 128 lb (58.1 kg)  10/22/16 128 lb 1.6 oz (58.1 kg)     ASSESSMENT AND PLAN:  1. CAD: Hx of 3 vessel CABG:  Recurrent chest pain. Abnormal stress MPI with decreased EF to 45%. Patient will be scheduled for repeat cardiac cath to  evaluate for further ischemia, and patency of bypass graphs. I have explained the cardiac cath procedure, risks and benefits. I have allowed for questions. He is willing to proceed with cath and possible intervention if necessary.   He is scheduled for Monday, 11/11/2016, with Dr. Herbie BaltimoreHarding. He is aware that he may need to spend the night if intervention is performed.   2. Hypercholesterolemia: He is tolerating reinstitution of medications. He complains of some fatigue but is getting used to them. Will recheck cholesterol status in 2 months to evaluate his response to treatment.   3. Ongoing tobacco abuse: He is still smoking up to 3 packs of cigarettes a day. I have again discussed the need for smoking cessation. He is not to smoke the morning of the cath. He verbalizes understanding.   4. AAA: May need further studies and follow up to evaluate status. Was followed by [facility].   5. COPD: Followed by PCP.    Current medicines are reviewed at length with the patient today.    Labs/ tests ordered today include:   Orders Placed This Encounter   Procedures  . CBC w/Diff/Platelet  . Basic Metabolic Panel (BMET)  . INR/PT     Disposition:   FU with post cath.    Signed, Joni ReiningKathryn Densel Kronick, NP  11/08/2016 1:26 PM    [facility] [address], [place], [place] [address] Phone: [phone]; Fax: [phone]

## 2016-11-08 NOTE — Patient Instructions (Addendum)
Your physician has requested that you have a cardiac catheterization Monday 11/11/16 at 7:30 am ARRIVE AT 5:30 AM. Cardiac catheterization is used to diagnose and/or treat various heart conditions. Doctors may recommend this procedure for a number of different reasons. The most common reason is to evaluate chest pain. Chest pain can be a symptom of coronary artery disease (CAD), and cardiac catheterization can show whether plaque is narrowing or blocking your heart's arteries. This procedure is also used to evaluate the valves, as well as measure the blood flow and oxygen levels in different parts of your heart. For further information please visit [email or web address] Please follow instruction sheet, as given.      Your physician recommends that you return for lab work in: TODAY at [facility]      They will schedule your follow up apt after your heart cath      Thank you for choosing [facility] !

## 2016-11-11 ENCOUNTER — Encounter (HOSPITAL_COMMUNITY)
Admission: RE | Disposition: A | Discharge status: Home / Self Care | Payer: Self-pay | Source: Ambulatory Visit | Attending: Cardiology

## 2016-11-11 ENCOUNTER — Encounter (HOSPITAL_COMMUNITY): Payer: Self-pay | Admitting: Cardiology

## 2016-11-11 ENCOUNTER — Ambulatory Visit (HOSPITAL_COMMUNITY)
Admission: RE | Admit: 2016-11-11 | Discharge: 2016-11-11 | Disposition: A | Discharge status: Home / Self Care | Payer: Medicare Other | Source: Ambulatory Visit | Attending: Cardiology | Admitting: Cardiology

## 2016-11-11 DIAGNOSIS — F419 Anxiety disorder, unspecified: Secondary | ICD-10-CM | POA: Diagnosis not present

## 2016-11-11 DIAGNOSIS — I25119 Atherosclerotic heart disease of native coronary artery with unspecified angina pectoris: Secondary | ICD-10-CM | POA: Diagnosis present

## 2016-11-11 DIAGNOSIS — I10 Essential (primary) hypertension: Secondary | ICD-10-CM | POA: Insufficient documentation

## 2016-11-11 DIAGNOSIS — I714 Abdominal aortic aneurysm, without rupture: Secondary | ICD-10-CM | POA: Diagnosis not present

## 2016-11-11 DIAGNOSIS — E7800 Pure hypercholesterolemia, unspecified: Secondary | ICD-10-CM | POA: Insufficient documentation

## 2016-11-11 DIAGNOSIS — F17210 Nicotine dependence, cigarettes, uncomplicated: Secondary | ICD-10-CM | POA: Insufficient documentation

## 2016-11-11 DIAGNOSIS — I2582 Chronic total occlusion of coronary artery: Secondary | ICD-10-CM | POA: Insufficient documentation

## 2016-11-11 DIAGNOSIS — Z8249 Family history of ischemic heart disease and other diseases of the circulatory system: Secondary | ICD-10-CM | POA: Diagnosis not present

## 2016-11-11 DIAGNOSIS — I209 Angina pectoris, unspecified: Secondary | ICD-10-CM | POA: Diagnosis present

## 2016-11-11 DIAGNOSIS — E785 Hyperlipidemia, unspecified: Secondary | ICD-10-CM | POA: Diagnosis not present

## 2016-11-11 DIAGNOSIS — R911 Solitary pulmonary nodule: Secondary | ICD-10-CM | POA: Diagnosis not present

## 2016-11-11 DIAGNOSIS — I2584 Coronary atherosclerosis due to calcified coronary lesion: Secondary | ICD-10-CM | POA: Diagnosis not present

## 2016-11-11 DIAGNOSIS — M1990 Unspecified osteoarthritis, unspecified site: Secondary | ICD-10-CM | POA: Diagnosis not present

## 2016-11-11 DIAGNOSIS — Z7902 Long term (current) use of antithrombotics/antiplatelets: Secondary | ICD-10-CM | POA: Insufficient documentation

## 2016-11-11 DIAGNOSIS — I340 Nonrheumatic mitral (valve) insufficiency: Secondary | ICD-10-CM | POA: Diagnosis not present

## 2016-11-11 DIAGNOSIS — Z951 Presence of aortocoronary bypass graft: Secondary | ICD-10-CM

## 2016-11-11 DIAGNOSIS — R9439 Abnormal result of other cardiovascular function study: Secondary | ICD-10-CM | POA: Diagnosis present

## 2016-11-11 DIAGNOSIS — Z7982 Long term (current) use of aspirin: Secondary | ICD-10-CM | POA: Insufficient documentation

## 2016-11-11 DIAGNOSIS — I252 Old myocardial infarction: Secondary | ICD-10-CM | POA: Insufficient documentation

## 2016-11-11 DIAGNOSIS — I2510 Atherosclerotic heart disease of native coronary artery without angina pectoris: Secondary | ICD-10-CM | POA: Diagnosis not present

## 2016-11-11 DIAGNOSIS — J449 Chronic obstructive pulmonary disease, unspecified: Secondary | ICD-10-CM | POA: Insufficient documentation

## 2016-11-11 DIAGNOSIS — Z952 Presence of prosthetic heart valve: Secondary | ICD-10-CM | POA: Insufficient documentation

## 2016-11-11 HISTORY — PX: CARDIAC CATHETERIZATION: SHX172

## 2016-11-11 HISTORY — DX: Atherosclerotic heart disease of native coronary artery with unspecified angina pectoris: I25.119

## 2016-11-11 SURGERY — LEFT HEART CATH AND CORS/GRAFTS ANGIOGRAPHY
Anesthesia: LOCAL

## 2016-11-11 MED ORDER — HEPARIN (PORCINE) IN NACL 2-0.9 UNIT/ML-% IJ SOLN
INTRAMUSCULAR | Status: DC | PRN
Start: 2016-11-11 — End: 2016-11-11
  Administered 2016-11-11: 1000 mL

## 2016-11-11 MED ORDER — IOPAMIDOL (ISOVUE-370) INJECTION 76%
INTRAVENOUS | Status: AC
  Filled 2016-11-11: qty 100

## 2016-11-11 MED ORDER — LIDOCAINE HCL (PF) 1 % IJ SOLN
INTRAMUSCULAR | Status: AC
  Filled 2016-11-11: qty 30

## 2016-11-11 MED ORDER — IOPAMIDOL (ISOVUE-370) INJECTION 76%
INTRAVENOUS | Status: DC | PRN
  Administered 2016-11-11: 130 mL via INTRA_ARTERIAL

## 2016-11-11 MED ORDER — HEPARIN SODIUM (PORCINE) 1000 UNIT/ML IJ SOLN
INTRAMUSCULAR | Status: DC | PRN
  Administered 2016-11-11: 3000 [IU] via INTRAVENOUS

## 2016-11-11 MED ORDER — MIDAZOLAM HCL 2 MG/2ML IJ SOLN
INTRAMUSCULAR | Status: AC
  Filled 2016-11-11: qty 2

## 2016-11-11 MED ORDER — FENTANYL CITRATE (PF) 100 MCG/2ML IJ SOLN
INTRAMUSCULAR | Status: AC
  Filled 2016-11-11: qty 2

## 2016-11-11 MED ORDER — ASPIRIN 81 MG PO CHEW
81.0000 mg | CHEWABLE_TABLET | ORAL | Status: AC
  Administered 2016-11-11: 81 mg via ORAL

## 2016-11-11 MED ORDER — FENTANYL CITRATE (PF) 100 MCG/2ML IJ SOLN
INTRAMUSCULAR | Status: DC | PRN
  Administered 2016-11-11: 25 ug via INTRAVENOUS

## 2016-11-11 MED ORDER — SODIUM CHLORIDE 0.9% FLUSH: 3.0000 mL | INTRAVENOUS | Status: DC | PRN

## 2016-11-11 MED ORDER — IOPAMIDOL (ISOVUE-370) INJECTION 76%
INTRAVENOUS | Status: AC
  Filled 2016-11-11: qty 125

## 2016-11-11 MED ORDER — SODIUM CHLORIDE 0.9 % IV SOLN: 250.0000 mL | INTRAVENOUS | Status: DC | PRN

## 2016-11-11 MED ORDER — ONDANSETRON HCL 4 MG/2ML IJ SOLN: 4.0000 mg | Freq: Four times a day (QID) | INTRAMUSCULAR | Status: DC | PRN

## 2016-11-11 MED ORDER — ASPIRIN 81 MG PO CHEW
CHEWABLE_TABLET | ORAL | Status: AC
  Administered 2016-11-11: 81 mg via ORAL
  Filled 2016-11-11: qty 1

## 2016-11-11 MED ORDER — CLOPIDOGREL BISULFATE 75 MG PO TABS
ORAL_TABLET | ORAL | Status: AC
  Filled 2016-11-11: qty 1

## 2016-11-11 MED ORDER — VERAPAMIL HCL 2.5 MG/ML IV SOLN
INTRAVENOUS | Status: AC
  Filled 2016-11-11: qty 2

## 2016-11-11 MED ORDER — CLOPIDOGREL BISULFATE 75 MG PO TABS
75.0000 mg | ORAL_TABLET | Freq: Once | ORAL | Status: AC
  Administered 2016-11-11: 75 mg via ORAL

## 2016-11-11 MED ORDER — SODIUM CHLORIDE 0.9 % IV SOLN: INTRAVENOUS | Status: DC

## 2016-11-11 MED ORDER — ACETAMINOPHEN 325 MG PO TABS: 650.0000 mg | ORAL_TABLET | ORAL | Status: DC | PRN

## 2016-11-11 MED ORDER — VERAPAMIL HCL 2.5 MG/ML IV SOLN
INTRAVENOUS | Status: DC | PRN
  Administered 2016-11-11: 10 mL via INTRA_ARTERIAL

## 2016-11-11 MED ORDER — NICOTINE 14 MG/24HR TD PT24: 14.0000 mg | MEDICATED_PATCH | Freq: Every day | TRANSDERMAL | Status: DC

## 2016-11-11 MED ORDER — LIDOCAINE HCL (PF) 1 % IJ SOLN
INTRAMUSCULAR | Status: DC | PRN
  Administered 2016-11-11: 2 mL

## 2016-11-11 MED ORDER — HEPARIN (PORCINE) IN NACL 2-0.9 UNIT/ML-% IJ SOLN
INTRAMUSCULAR | Status: AC
  Filled 2016-11-11: qty 1000

## 2016-11-11 MED ORDER — MIDAZOLAM HCL 2 MG/2ML IJ SOLN
INTRAMUSCULAR | Status: DC | PRN
  Administered 2016-11-11: 1 mg via INTRAVENOUS

## 2016-11-11 MED ORDER — SODIUM CHLORIDE 0.9% FLUSH: 3.0000 mL | Freq: Two times a day (BID) | INTRAVENOUS | Status: DC

## 2016-11-11 MED ORDER — HEPARIN SODIUM (PORCINE) 1000 UNIT/ML IJ SOLN
INTRAMUSCULAR | Status: AC
  Filled 2016-11-11: qty 1

## 2016-11-11 SURGICAL SUPPLY — 10 items
CATH EXPO 5F MPA-1 (CATHETERS) ×1 IMPLANT
CATH INFINITI 5FR MULTPACK ANG (CATHETERS) ×1 IMPLANT
DEVICE RAD COMP TR BAND LRG (VASCULAR PRODUCTS) ×1 IMPLANT
GLIDESHEATH SLEND A-KIT 6F 22G (SHEATH) ×1 IMPLANT
GUIDEWIRE INQWIRE 1.5J.035X260 (WIRE) IMPLANT
INQWIRE 1.5J .035X260CM (WIRE) ×2
KIT HEART LEFT (KITS) ×2 IMPLANT
PACK CARDIAC CATHETERIZATION (CUSTOM PROCEDURE TRAY) ×2 IMPLANT
TRANSDUCER W/STOPCOCK (MISCELLANEOUS) ×3 IMPLANT
TUBING CIL FLEX 10 FLL-RA (TUBING) ×2 IMPLANT

## 2016-11-11 NOTE — Interval H&P Note (Signed)
History and Physical Interval Note:  11/11/2016 7:40 AM  [name]  has presented today for surgery, with the diagnosis of abnormal stress test with known history of CABG for MV CAD.    The various methods of treatment have been discussed with the patient and family. After consideration of risks, benefits and other options for treatment, the patient has consented to  Procedure(s): Left Heart Cath and Cors/Grafts Angiography (N/A) with possible Percutaneous Coronary Intervention as a surgical intervention .  The patient's history has been reviewed, patient examined, no change in status, stable for surgery.  I have reviewed the patient's chart and labs.  Questions were answered to the patient's satisfaction.    Cath Lab Visit (complete for each Cath Lab visit)  Clinical Evaluation Leading to the Procedure:   ACS: No.  Non-ACS:    Anginal Classification: CCS II  Anti-ischemic medical therapy: Minimal Therapy (1 class of medications)  Non-Invasive Test Results: Intermediate-risk stress test findings: cardiac mortality 1-3%/year  Prior CABG: Previous CABG   Bryan Lemmaavid Shainna Faux

## 2016-11-11 NOTE — Progress Notes (Signed)
Per Robbie LisBrittainy Simmons, PA ok to return to work on this Wednesday

## 2016-11-11 NOTE — H&P (View-Only) (Signed)
Cardiology Office Note   Date:  11/08/2016   ID:  [name], DOB 09/10/1951, MRN [id number]  PCP:  GOSRANI,NIMISH C, MD  Cardiologist: Koneswaran  Robi Mitter, NP   Chief Complaint  Patient presents with  . Shortness of Breath  . Coronary Artery Disease    Hx of CABG      History of Present Illness: [name] is a 65 y.o. male who presents for ongoing assessment and management of multivessel coronary artery disease. He has a history of CABG with SVG to RCA and LIMA to LAD, (2015)tricuspid valve repair with a 26 mm [employer] life [employer] M3 angioplasty ring, abdominal aortic aneurysm followed by vascular surgery, hypertension, hyperlipidemia, and COPD.  On last office visit on 10/24/2016, the patient was being reestablished in the office as he had not been followed by cardiology regularly (for over 2 years). He was continued have episodes of angina. He had not been taking any medications. He was scheduled for a stress test for evaluation of progressive coronary artery disease, he was restarted on Lipitor 40 mg daily, Doppler ultrasound was completed due to audible bilateral bruits, and he was counseled urgently on tobacco cessation. He was restarted on all of his medications to include Plavix, ASA, Lipitor, Amiodarone, and Coreg.   Stress MPI: 12/04/2016   Blood pressure demonstrated a normal response to exercise.  Defect 1: There is a medium defect of severe severity present in the basal anteroseptal, basal inferoseptal, basal inferior, mid inferoseptal, mid inferior and apical anterior location.  Findings consistent with prior myocardial infarction with peri-infarct ischemia primarily in the anteroseptal wall and septum.  This is an intermediate risk study.  Nuclear stress EF: 46%.   Echocardiogram 11/01/2016 Left ventricle: The cavity size was normal. Wall thickness was   increased in a pattern of mild LVH. Systolic function was mildly   reduced. The estimated  ejection fraction was 45%. - Regional wall motion abnormality: Moderate hypokinesis of the   mid-apical anterior, basal anteroseptal, basal-mid inferoseptal,   and apical septal myocardium; mild hypokinesis of the mid   anteroseptal and apical myocardium. - Aortic valve: Mildly thickened, moderately calcified leaflets.   Morphologically, there may be very mild aortic stenosis. - Mitral valve: Mildly calcified annulus. Normal thickness leaflets   . There was mild regurgitation. - Left atrium: The atrium was mildly dilated. - Right ventricle: Systolic function was mildly reduced. - Tricuspid valve: S/p tricuspid valve repair using a 26 mm [employer] M3 annuloplasty ring. There was trivial   regurgitation. - Pulmonary arteries: PA peak pressure: 35 mm Hg (S). - Systemic veins: Dilated IVC with normal respiratory variation.   Estimated CVP 8 mmHg.  Carotid Doppler 11/01/16 IMPRESSION: Less than 50% stenosis in the right and left internal carotid arteries. There is calcified plaque in the bilateral bulbs. There is an elevated velocity in the left external carotid artery likely the cause of the bruit.  CT of Abdomen 01/23/2014 IMPRESSION: 1. Emphysematous changes again noted lung bases. Stable nodules in right middle lobe. 2. No hydronephrosis or hydroureter. 3. No small bowel obstruction. 4. There is moderate distension of proximal colon with liquid and stool. Abundant stool noted in proximal sigmoid colon. No definite colonic obstruction. The distal sigmoid colon and rectum are empty partially collapsed. 5. No pericecal inflammation.  Normal appendix. 6. 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   Past Medical History:    Diagnosis Date  . AAA (abdominal aortic aneurysm) (HCC)   . Anxiety   . Arthritis   . COPD (chronic obstructive pulmonary disease) (HCC)   . MI (myocardial infarction)    . Pulmonary nodule    benign    Past Surgical History:  Procedure Laterality Date  . AORTIC VALVE REPLACEMENT    . CORONARY ARTERY BYPASS GRAFT    . INGUINAL HERNIA REPAIR Bilateral   . VALVE REPLACEMENT       Current Outpatient Prescriptions  Medication Sig Dispense Refill  . ALPRAZolam (XANAX) 1 MG tablet Take 1 mg by mouth 4 (four) times daily as needed. For anxiety  2  . amiodarone (PACERONE) 200 MG tablet Take 200 mg by mouth 2 (two) times daily.    . aspirin EC 81 MG tablet Take 324 mg by mouth once.     . atorvastatin (LIPITOR) 40 MG tablet Take 1 tablet (40 mg total) by mouth daily. 90 tablet 3  . carvedilol (COREG) 3.125 MG tablet Take 3.125 mg by mouth 2 (two) times daily with a meal.   3  . clopidogrel (PLAVIX) 75 MG tablet Take 75 mg by mouth daily.   3  . furosemide (LASIX) 20 MG tablet Take 20 mg by mouth 2 (two) times daily.   0  . HYDROcodone-acetaminophen (NORCO) 7.5-325 MG tablet Take 1 tablet by mouth 4 (four) times daily as needed. For pain  0  . nitroGLYCERIN (NITROSTAT) 0.4 MG SL tablet Place 1 tablet (0.4 mg total) under the tongue every 5 (five) minutes as needed for chest pain. 90 tablet 3  . potassium chloride SA (K-DUR,KLOR-CON) 20 MEQ tablet Take 1 tablet (20 mEq total) by mouth daily. 30 tablet 6  . vitamin C (ASCORBIC ACID) 500 MG tablet Take 500 mg by mouth daily.     No current facility-administered medications for this visit.     Allergies:   Patient has no known allergies.    Social History:  The patient  reports that he has been smoking Cigarettes.  He has a 180.00 pack-year smoking history. He has never used smokeless tobacco. He reports that he drinks about 0.6 oz of alcohol per week . He reports that he does not use drugs.   Family History:  The patient's family history includes Heart disease in his father.    ROS: All other systems are reviewed and negative. Unless otherwise mentioned in H&P    PHYSICAL EXAM: VS:  BP 108/62   Pulse  72   Ht 5' 7" (1.702 m)   Wt 129 lb (58.5 kg)   SpO2 93%   BMI 20.20 kg/m  , BMI Body mass index is 20.2 kg/m. GEN: Well nourished, well developed, in no acute distress  HEENT: normal  Neck: no JVD, left carotid bruit. no, or masses Cardiac: RRR; no murmurs, rubs, or gallops,no edema  Respiratory:  Clear to auscultation bilaterally, with scant crackles in the bases.  GI: soft, nontender, nondistended, + BS MS: no deformity or atrophy  Skin: warm and dry, no rash Neuro:  Strength and sensation are intact Psych: euthymic mood, full affect   Wt Readings from Last 3 Encounters:  11/08/16 129 lb (58.5 kg)  10/24/16 128 lb (58.1 kg)  10/22/16 128 lb 1.6 oz (58.1 kg)     ASSESSMENT AND PLAN:  1. CAD: Hx of 3 vessel CABG:  Recurrent chest pain. Abnormal stress MPI with decreased EF to 45%. Patient will be scheduled for repeat cardiac cath to   evaluate for further ischemia, and patency of bypass graphs. I have explained the cardiac cath procedure, risks and benefits. I have allowed for questions. He is willing to proceed with cath and possible intervention if necessary.   He is scheduled for Monday, 11/11/2016, with Dr. Harding. He is aware that he may need to spend the night if intervention is performed.   2. Hypercholesterolemia: He is tolerating reinstitution of medications. He complains of some fatigue but is getting used to them. Will recheck cholesterol status in 2 months to evaluate his response to treatment.   3. Ongoing tobacco abuse: He is still smoking up to 3 packs of cigarettes a day. I have again discussed the need for smoking cessation. He is not to smoke the morning of the cath. He verbalizes understanding.   4. AAA: May need further studies and follow up to evaluate status. Was followed by [facility].   5. COPD: Followed by PCP.    Current medicines are reviewed at length with the patient today.    Labs/ tests ordered today include:   Orders Placed This Encounter   Procedures  . CBC w/Diff/Platelet  . Basic Metabolic Panel (BMET)  . INR/PT     Disposition:   FU with post cath.    Signed, Michaline Kindig, NP  11/08/2016 1:26 PM     [facility] [address], [place], [place] [address] Phone: [phone]; Fax: [phone] 

## 2016-11-11 NOTE — Discharge Instructions (Signed)
Introduction __________________[name]_____________________________________ needs to be excused from: __X__ Work ____ School ____ Physical activity beginning now and until  the following date: _________1/24/18_______. He or she may return to work or school but should still avoid the following physical activity or activities from now until ________________. Activity restrictions include: ___x_ Lifting more than _____10__ lb for 5 days  ____ Sitting longer than __________ minutes at a time ____ Standing longer than ________ minutes at a time ____ He or she may return to full physical activity as of ________________. Health Care Provider Name (printed): ___________Dr Onalee Hua Harding_____________________________ [facility] Provider (signature): ___________________________________________ Date: _________1/22/18_______ This information is not intended to replace advice given to you by your health care provider. Make sure you discuss any questions you have with your health care provider. Document Released: 04/02/2001 Document Revised: 04/26/2016 Document Reviewed: 05/09/2014    2017 Elsevier Radial Site Care Introduction Refer to this sheet in the next few weeks. These instructions provide you with information about caring for yourself after your procedure. Your health care provider may also give you more specific instructions. Your treatment has been planned according to current medical practices, but problems sometimes occur. Call your health care provider if you have any problems or questions after your procedure. What can I expect after the procedure? After your procedure, it is typical to have the following:  Bruising at the radial site that usually fades within 1-2 weeks.  Blood collecting in the tissue (hematoma) that may be painful to the touch. It should usually decrease in size and tenderness within 1-2 weeks. Follow these instructions at home:  Take medicines only as directed  by your health care provider.  You may shower 24-48 hours after the procedure or as directed by your health care provider. Remove the bandage (dressing) and gently wash the site with plain soap and water. Pat the area dry with a clean towel. Do not rub the site, because this may cause bleeding.  Do not take baths, swim, or use a hot tub until your health care provider approves.  Check your insertion site every day for redness, swelling, or drainage.  Do not apply powder or lotion to the site.  Do not flex or bend the affected arm for 24 hours or as directed by your health care provider.  Do not push or pull heavy objects with the affected arm for 24 hours or as directed by your health care provider.  Do not lift over 10 lb (4.5 kg) for 5 days after your procedure or as directed by your health care provider.  Ask your health care provider when it is okay to:  Return to work or school.  Resume usual physical activities or sports.  Resume sexual activity.  Do not drive home if you are discharged the same day as the procedure. Have someone else drive you.  You may drive 24 hours after the procedure unless otherwise instructed by your health care provider.  Do not operate machinery or power tools for 24 hours after the procedure.  If your procedure was done as an outpatient procedure, which means that you went home the same day as your procedure, a responsible adult should be with you for the first 24 hours after you arrive home.  Keep all follow-up visits as directed by your health care provider. This is important. Contact a health care provider if:  You have a fever.  You have chills.  You have increased bleeding from the radial site. Hold pressure on the  site. Get help right away if:  You have unusual pain at the radial site.  You have redness, warmth, or swelling at the radial site.  You have drainage (other than a small amount of blood on the dressing) from the radial  site.  The radial site is bleeding, and the bleeding does not stop after 30 minutes of holding steady pressure on the site.  Your arm or hand becomes pale, cool, tingly, or numb. This information is not intended to replace advice given to you by your health care provider. Make sure you discuss any questions you have with your health care provider. Document Released: 11/09/2010 Document Revised: 03/14/2016 Document Reviewed: 04/25/2014  2017 Elsevier

## 2016-12-19 DIAGNOSIS — F419 Anxiety disorder, unspecified: Secondary | ICD-10-CM | POA: Diagnosis not present

## 2016-12-19 DIAGNOSIS — G894 Chronic pain syndrome: Secondary | ICD-10-CM | POA: Diagnosis not present

## 2016-12-19 DIAGNOSIS — Z6821 Body mass index (BMI) 21.0-21.9, adult: Secondary | ICD-10-CM | POA: Diagnosis not present

## 2016-12-19 DIAGNOSIS — Z1389 Encounter for screening for other disorder: Secondary | ICD-10-CM | POA: Diagnosis not present

## 2017-02-04 ENCOUNTER — Encounter: Payer: Self-pay | Admitting: *Deleted

## 2017-02-04 ENCOUNTER — Ambulatory Visit (INDEPENDENT_AMBULATORY_CARE_PROVIDER_SITE_OTHER): Payer: Medicare Other | Admitting: Adult Health

## 2017-02-04 ENCOUNTER — Encounter: Payer: Self-pay | Admitting: Adult Health

## 2017-02-04 VITALS — BP 110/58 | HR 78 | Ht 66.0 in | Wt 131.0 lb

## 2017-02-04 DIAGNOSIS — I2510 Atherosclerotic heart disease of native coronary artery without angina pectoris: Secondary | ICD-10-CM | POA: Diagnosis not present

## 2017-02-04 DIAGNOSIS — R42 Dizziness and giddiness: Secondary | ICD-10-CM | POA: Diagnosis not present

## 2017-02-04 DIAGNOSIS — D649 Anemia, unspecified: Secondary | ICD-10-CM | POA: Diagnosis not present

## 2017-02-04 DIAGNOSIS — I10 Essential (primary) hypertension: Secondary | ICD-10-CM | POA: Diagnosis not present

## 2017-02-04 DIAGNOSIS — Z79899 Other long term (current) drug therapy: Secondary | ICD-10-CM | POA: Diagnosis not present

## 2017-02-04 MED ORDER — FUROSEMIDE 20 MG PO TABS: 20.0000 mg | ORAL_TABLET | Freq: Every day | ORAL | 3 refills | Status: DC

## 2017-02-04 MED ORDER — AMIODARONE HCL 200 MG PO TABS: 200.0000 mg | ORAL_TABLET | Freq: Every day | ORAL | 3 refills | Status: DC

## 2017-02-04 NOTE — Progress Notes (Deleted)
Name: [name]    DOB: [birth year]  Age: 66 y.o.  MR#: [id number]       PCP:  Wilson Singer, MD      Insurance: Payor: MEDICARE / Plan: MEDICARE PART A AND B / Product Type: *No Product type* /   CC:   No chief complaint on file.   VS Vitals:   02/04/17 1542  Pulse: 78  SpO2: 95%  Weight: 131 lb (59.4 kg)  Height:  (1.676 m)    Weights Current Weight  02/04/17 131 lb (59.4 kg)  11/11/16 127 lb (57.6 kg)  11/08/16 129 lb (58.5 kg)    Blood Pressure  BP Readings from Last 3 Encounters:  11/11/16 (!) 109/53  11/08/16 108/62  10/24/16 116/66     Admit date:  (Not on file) Last encounter with RMR:  11/08/2016   Allergy Patient has no known allergies.  Current Outpatient Prescriptions  Medication Sig Dispense Refill  . ALPRAZolam (XANAX) 1 MG tablet Take 1 mg by mouth 4 (four) times daily as needed for anxiety.   2  . amiodarone (PACERONE) 200 MG tablet Take 200 mg by mouth 2 (two) times daily.    Marland Kitchen aspirin EC 81 MG tablet Take 81 mg by mouth daily.     Marland Kitchen atorvastatin (LIPITOR) 40 MG tablet Take 1 tablet (40 mg total) by mouth daily. 90 tablet 3  . carvedilol (COREG) 3.125 MG tablet Take 3.125 mg by mouth 2 (two) times daily with a meal.   3  . clopidogrel (PLAVIX) 75 MG tablet Take 75 mg by mouth daily.   3  . furosemide (LASIX) 20 MG tablet Take 20 mg by mouth 2 (two) times daily.   0  . HYDROcodone-acetaminophen (NORCO) 7.5-325 MG tablet Take 1 tablet by mouth 4 (four) times daily as needed for moderate pain.   0  . potassium chloride SA (K-DUR,KLOR-CON) 20 MEQ tablet Take 1 tablet (20 mEq total) by mouth daily. 30 tablet 6  . vitamin C (ASCORBIC ACID) 500 MG tablet Take 500 mg by mouth daily.    . nitroGLYCERIN (NITROSTAT) 0.4 MG SL tablet Place 1 tablet (0.4 mg total) under the tongue every 5 (five) minutes as needed for chest pain. 90 tablet 3   No current facility-administered medications for this visit.     Discontinued Meds:   There are no  discontinued medications.  Patient Active Problem List   Diagnosis Date Noted  . Angina, class II (HCC) 11/11/2016  . Abnormal nuclear stress test 11/11/2016  . Chest pain 10/09/2016  . Coronary artery disease involving native heart with angina pectoris (HCC) 10/09/2016  . Tobacco abuse 10/09/2016  . Pure hypercholesterolemia 03/21/2015  . 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 AAA (abdominal aortic aneurysm) without rupture (HCC) 03/01/2014    LABS    Component Value Date/Time   NA 137 11/08/2016 1341   NA 136 10/09/2016 2016   NA 137 01/23/2014 1456   K 4.1 11/08/2016 1341   K 4.0 10/09/2016 2016   K 4.3 01/23/2014 1456   CL 103 11/08/2016 1341   CL 106 10/09/2016 2016   CL 97 01/23/2014 1456   CO2 27 11/08/2016 1341   CO2 24 10/09/2016 2016   CO2 29 01/23/2014 1456   GLUCOSE 114 (H) 11/08/2016 1341   GLUCOSE 79 10/09/2016 2016   GLUCOSE 116 (H) 01/23/2014 1456   BUN 13 11/08/2016 1341   BUN 12 10/09/2016 2016   BUN 8  01/23/2014 1456   CREATININE 0.94 11/08/2016 1341   CREATININE 0.90 10/09/2016 2016   CREATININE 0.85 01/23/2014 1456   CALCIUM 9.1 11/08/2016 1341   CALCIUM 9.0 10/09/2016 2016   CALCIUM 9.7 01/23/2014 1456   GFRNONAA >60 11/08/2016 1341   GFRNONAA >60 10/09/2016 2016   GFRNONAA 89 (L) 01/23/2014 1456   GFRAA >60 11/08/2016 1341   GFRAA >60 10/09/2016 2016   GFRAA >90 01/23/2014 1456   CMP     Component Value Date/Time   NA 137 11/08/2016 1341   K 4.1 11/08/2016 1341   CL 103 11/08/2016 1341   CO2 27 11/08/2016 1341   GLUCOSE 114 (H) 11/08/2016 1341   BUN 13 11/08/2016 1341   CREATININE 0.94 11/08/2016 1341   CALCIUM 9.1 11/08/2016 1341   PROT 7.6 01/23/2014 1456   ALBUMIN 4.0 01/23/2014 1456   AST 20 01/23/2014 1456   ALT 8 01/23/2014 1456   ALKPHOS 80 01/23/2014 1456   BILITOT 0.5 01/23/2014 1456   GFRNONAA >60 11/08/2016 1341   GFRAA >60 11/08/2016 1341       Component Value Date/Time   WBC 7.5 11/08/2016 1341    WBC 9.6 10/09/2016 2016   WBC 10.0 01/23/2014 1456   HGB 14.1 11/08/2016 1341   HGB 13.7 10/09/2016 2016   HGB 15.2 01/23/2014 1456   HCT 41.0 11/08/2016 1341   HCT 40.1 10/09/2016 2016   HCT 45.0 01/23/2014 1456   MCV 100.7 (H) 11/08/2016 1341   MCV 101.0 (H) 10/09/2016 2016   MCV 98.0 01/23/2014 1456    Lipid Panel  No results found for: CHOL, TRIG, HDL, CHOLHDL, VLDL, LDLCALC, LDLDIRECT  ABG    Component Value Date/Time   PHART 7.388 01/08/2011 1100   PCO2ART 39.7 01/08/2011 1100   PO2ART 93.3 01/08/2011 1100   HCO3 23.4 01/08/2011 1100   TCO2 20.2 01/08/2011 1100   ACIDBASEDEF 0.9 01/08/2011 1100   O2SAT 97.7 01/08/2011 1100     No results found for: TSH BNP (last 3 results) No results for input(s): BNP in the last 8760 hours.  ProBNP (last 3 results) No results for input(s): PROBNP in the last 8760 hours.  Cardiac Panel (last 3 results) No results for input(s): CKTOTAL, CKMB, TROPONINI, RELINDX in the last 72 hours.  Iron/TIBC/Ferritin/ %Sat No results found for: IRON, TIBC, FERRITIN, IRONPCTSAT   EKG Orders placed or performed during the hospital encounter of 11/11/16  . EKG 12-Lead  . EKG 12-Lead  . EKG 12-Lead  . EKG 12-Lead     Prior Assessment and Plan Problem List as of 02/04/2017 Reviewed: 11/11/2016  8:41 AM by Bryan Lemma, MD     Cardiovascular and Mediastinum   Coronary artery disease involving native heart with angina pectoris (HCC)   AAA (abdominal aortic aneurysm) without rupture (HCC)   Angina, class II (HCC)     Other   S/P CABG (coronary artery bypass graft)   Pure hypercholesterolemia   Chest pain   Tobacco abuse   Abnormal nuclear stress test       Imaging: No results found.

## 2017-02-04 NOTE — Progress Notes (Signed)
Cardiology Office Note   Date:  02/04/2017   ID:  [name], DOB 07-14-1951, MRN [id number]  PCP:  Wilson Singer, MD  Cardiologist: Inis Sizer, NP   Chief Complaint  Patient presents with  . Dizziness      History of Present Illness: [name] is a 66 y.o. male who presents for ongoing assessment and management of multivessel coronary artery disease, history of CABG (SVG to RCA, LIMA to LAD in 2015), tricuspid valve repair with a 26 mm [employer] M3 angioplasty ring, history of AAA followed by vascular surgery, hypertension, hyperlipidemia, and COPD.  The patient was last in the office on 11/08/2016 with complaints of recurrent chest pain. A repeat stress MPI revealed EF of 45%, with areas of ischemia. He was scheduled for cardiac catheterization to evaluate for further ischemia and patency of bypass grafts.  Cardiac catheterization was completed by Dr. Herbie Baltimore on 11/11/2016, this revealed widely patent grafts and native circumflex with known occlusion of the LAD and now occluded RCA. No obvious culprit lesion to explain patient's angina besides the short segment of the proximal LAD, isolated by proximal occlusion and distal 95% stenosis. There were no targets for PCI. He was to continue medical management.  He comes today with complaints of dizziness. This can be with position change or while seated. 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 He denies chest pain, rapid HR, dyspnea associated.    Past Medical History:  Diagnosis Date  . AAA (abdominal aortic aneurysm) (HCC)   . Anxiety   . Arthritis   . COPD (chronic obstructive pulmonary disease) (HCC)   . 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 MI (myocardial infarction) (HCC)   . Pulmonary nodule    benign    Past Surgical History:   Procedure Laterality Date  . CARDIAC CATHETERIZATION  01/2015   Coronary angiography on 02/17/15 demonstrated the left main coronary artery to have mild 30-40% stenosis, 100% LAD stenosis with right to left collaterals and faint left to left collaterals, small nondominant left circumflex, small to medium caliber first obtuse marginal branch with 20% ostial and proximal stenosis. The RCA was noted to have diffuse disease.  Marland Kitchen CARDIAC CATHETERIZATION N/A 11/11/2016   Procedure: Left Heart Cath and Cors/Grafts Angiography;  Surgeon: Marykay Lex, MD;  Location: [facility] INVASIVE CV LAB;  Service: Cardiovascular;  Laterality: N/A;  . CORONARY ARTERY BYPASS GRAFT  01/2015  . INGUINAL HERNIA REPAIR Bilateral   . VALVE REPLACEMENT  01/2015   Tricuspid Valve Replacement     Current Outpatient Prescriptions  Medication Sig Dispense Refill  . ALPRAZolam (XANAX) 1 MG tablet Take 1 mg by mouth 4 (four) times daily as needed for anxiety.   2  . aspirin EC 81 MG tablet Take 81 mg by mouth daily.     Marland Kitchen atorvastatin (LIPITOR) 40 MG tablet Take 1 tablet (40 mg total) by mouth daily. 90 tablet 3  . carvedilol (COREG) 3.125 MG tablet Take 3.125 mg by mouth 2 (two) times daily with a meal.   3  . clopidogrel (PLAVIX) 75 MG tablet Take 75 mg by mouth daily.   3  . HYDROcodone-acetaminophen (NORCO) 7.5-325 MG tablet Take 1 tablet by mouth 4 (four) times daily as needed for moderate pain.   0  . potassium chloride  SA (K-DUR,KLOR-CON) 20 MEQ tablet Take 1 tablet (20 mEq total) by mouth daily. 30 tablet 6  . vitamin C (ASCORBIC ACID) 500 MG tablet Take 500 mg by mouth daily.    Marland Kitchen amiodarone (PACERONE) 200 MG tablet Take 1 tablet (200 mg total) by mouth daily. 90 tablet 3  . furosemide (LASIX) 20 MG tablet Take 1 tablet (20 mg total) by mouth daily. 90 tablet 3  . nitroGLYCERIN (NITROSTAT) 0.4 MG SL tablet Place 1 tablet (0.4 mg total) under the tongue every 5 (five) minutes as needed for chest pain. 90 tablet 3   No  current facility-administered medications for this visit.     Allergies:   Patient has no known allergies.    Social History:  The patient  reports that he has been smoking Cigarettes.  He has a 180.00 pack-year smoking history. He has never used smokeless tobacco. He reports that he drinks about 0.6 oz of alcohol per week . He reports that he does not use drugs.   Family History:  The patient's family history includes Heart disease in his father.    ROS: All other systems are reviewed and negative. Unless otherwise mentioned in H&P    PHYSICAL EXAM: VS:  BP (!) 110/58   Pulse 78   Ht  (1.676 m)   Wt 131 lb (59.4 kg)   SpO2 95%   BMI 21.14 kg/m  , BMI Body mass index is 21.14 kg/m. GEN: Well nourished, well developed, in no acute distress  HEENT: normal  Neck: no JVD, carotid bruits, or masses Cardiac: RRR; no murmurs, rubs, or gallops,no edema  Respiratory:  clear to auscultation bilaterally, normal work of breathing GI: soft, nontender, nondistended, + BS MS: no deformity or atrophy  Skin: warm and dry, no rash Neuro:  Strength and sensation are intact Psych: euthymic mood, full affect  Recent Labs: 11/08/2016: BUN 13; Creatinine, Ser 0.94; Hemoglobin 14.1; Platelets 191; Potassium 4.1; Sodium 137    Lipid Panel No results found for: CHOL, TRIG, HDL, CHOLHDL, VLDL, LDLCALC, LDLDIRECT    Wt Readings from Last 3 Encounters:  02/04/17 131 lb (59.4 kg)  11/11/16 127 lb (57.6 kg)  11/08/16 129 lb (58.5 kg)      Other studies Reviewed: Echocardiogram Left ventricle: The cavity size was normal. Wall thickness was   increased in a pattern of mild LVH. Systolic function was mildly   reduced. The estimated ejection fraction was 45%. - Regional wall motion abnormality: Moderate hypokinesis of the   mid-apical anterior, basal anteroseptal, basal-mid inferoseptal,   and apical septal myocardium; mild hypokinesis of the mid   anteroseptal and apical myocardium. -  Aortic valve: Mildly thickened, moderately calcified leaflets.   Morphologically, there may be very mild aortic stenosis. - Mitral valve: Mildly calcified annulus. Normal thickness leaflets   . There was mild regurgitation. - Left atrium: The atrium was mildly dilated. - Right ventricle: Systolic function was mildly reduced. 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 There was trivial   regurgitation. - Pulmonary arteries: PA peak pressure: 35 mm Hg (S). - Systemic veins: Dilated IVC with normal respiratory variation.   Estimated CVP 8 mmHg.  ASSESSMENT AND PLAN:  1.  Dizziness: He is hypotensive on this visit. I will decrease lasix to 20 mg daily, and decrease amiodarone to 200 mg daily. I will check BMET to evaluate for dehydration.   2,. Hypertension:BP is low today.  Medication adjustments as described. He is given work Physicist, medical to return to work in 4 days. He will take his BP daily and record. He will bring this back for review before returning to work.   3. CAD:: Hx of CABG in 2015. No complaints of angina. Recent cath in 2018 revealed CTO RCA. No targets for PCI. Continue secondary prevention with statin, and BP control.   4. Ongoing tobacco abuse: He is down to 1/2 ppd. He is encouraged to stop completely.      Current medicines are reviewed at length with the patient today.    Labs/ tests ordered today include:   Orders Placed This Encounter  Procedures  . Basic Metabolic Panel (BMET)  . CBC with Differential     Disposition:   FU with one month.  Signed, Joni Reining, NP  02/04/2017 5:34 PM    [facility] [address], [place], [place] [address] Phone: [phone]; Fax: [phone]

## 2017-02-04 NOTE — Patient Instructions (Addendum)
Your physician recommends that you schedule a follow-up appointment in: 1 Month with Joni Reining, NP  Your physician has recommended you make the following change in your medication:   Decrease Lasix to 20 mg Daily   Decrease Amiodarone 200 mg Daily   Your physician recommends that you return for lab work in: Today  Your physician has requested that you regularly monitor and record your blood pressure readings at home. Please use the same machine at the same time of day to check your readings and record them to bring to your follow-up visit.  You have been given a note for work.  If you need a refill on your cardiac medications before your next appointment, please call your pharmacy.  Thank you for choosing [facility]!

## 2017-02-05 ENCOUNTER — Other Ambulatory Visit (HOSPITAL_COMMUNITY)
Admission: RE | Admit: 2017-02-05 | Discharge: 2017-02-05 | Disposition: A | Discharge status: Home / Self Care | Payer: Medicare Other | Source: Ambulatory Visit | Attending: Adult Health | Admitting: Adult Health

## 2017-02-05 DIAGNOSIS — Z79899 Other long term (current) drug therapy: Secondary | ICD-10-CM | POA: Diagnosis not present

## 2017-02-05 LAB — CBC WITH DIFFERENTIAL/PLATELET
Basophils Absolute: 0 10*3/uL (ref 0.0–0.1)
Basophils Relative: 1 %
EOS ABS: 0.1 10*3/uL (ref 0.0–0.7)
Eosinophils Relative: 2 %
HCT: 40.8 % (ref 39.0–52.0)
HEMOGLOBIN: 13.8 g/dL (ref 13.0–17.0)
Lymphocytes Relative: 34 %
Lymphs Abs: 2.8 10*3/uL (ref 0.7–4.0)
MCH: 34.8 pg — ABNORMAL HIGH (ref 26.0–34.0)
MCHC: 33.8 g/dL (ref 30.0–36.0)
MCV: 102.8 fL — ABNORMAL HIGH (ref 78.0–100.0)
Monocytes Absolute: 0.7 10*3/uL (ref 0.1–1.0)
Monocytes Relative: 8 %
NEUTROS PCT: 55 %
Neutro Abs: 4.5 10*3/uL (ref 1.7–7.7)
Platelets: 154 10*3/uL (ref 150–400)
RBC: 3.97 MIL/uL — AB (ref 4.22–5.81)
RDW: 13.4 % (ref 11.5–15.5)
WBC: 8.1 10*3/uL (ref 4.0–10.5)

## 2017-02-05 LAB — BASIC METABOLIC PANEL
ANION GAP: 5 (ref 5–15)
BUN: 9 mg/dL (ref 6–20)
CO2: 29 mmol/L (ref 22–32)
CREATININE: 0.98 mg/dL (ref 0.61–1.24)
Calcium: 8.8 mg/dL — ABNORMAL LOW (ref 8.9–10.3)
Chloride: 101 mmol/L (ref 101–111)
GFR calc non Af Amer: >60 mL/min (ref >60)
Glucose, Bld: 141 mg/dL — ABNORMAL HIGH (ref 65–99)
Potassium: 4.5 mmol/L (ref 3.5–5.1)
SODIUM: 135 mmol/L (ref 135–145)

## 2017-02-06 ENCOUNTER — Telehealth: Payer: Self-pay | Admitting: *Deleted

## 2017-02-06 NOTE — Telephone Encounter (Signed)
Called patient with test results. No answer. Left message to call back.  

## 2017-02-06 NOTE — Telephone Encounter (Signed)
-----   Message from Jodelle Gross, NP sent at 02/06/2017  6:30 AM EDT ----- Labs reviewed. No evidence of anemia or dehydration. Continue lower dose of lasix and amiodarone. Follow up BP recordings as requested.

## 2017-03-06 ENCOUNTER — Ambulatory Visit: Payer: Self-pay | Admitting: Adult Health

## 2017-03-06 NOTE — Progress Notes (Deleted)
Cardiology Office Note   Date:  03/06/2017   ID:  [name], DOB 1951-09-06, MRN [id number]  PCP:  Wilson SingerGosrani, Nimish C, MD  Cardiologist:  Mammie LorenzoKoneswaran   No chief complaint on file.     History of Present Illness: [name] is a 66 y.o. male who presents for ongoing assessment and management of CAD, with history of CABG in 2015 (SVG to RCA, LIMA to LAD in 2015), tricuspid valve repair with a 26 mm [employer] M3 angioplasty ring, history of AAA followed by vascular surgery, hypertension, hyperlipidemia, and COPD. The patient was last seen in the office on 02/04/2017, complaining of dizziness. He was found to be hypotensive and his lasix was decreased to 20 mg daily, amiodarone was decreased to 200 mg daily, with follow up BMET to evaluate for dehydration.   Labs; Na 135, Potassium 4.5, creatinine 0.98. Hgb 13.8; Hct 40.8.   Past Medical History:  Diagnosis Date  . AAA (abdominal aortic aneurysm) (HCC)   . Anxiety   . Arthritis   . COPD (chronic obstructive pulmonary disease) (HCC)   . 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 MI (myocardial infarction) (HCC)   . Pulmonary nodule    benign    Past Surgical History:  Procedure Laterality Date  . CARDIAC CATHETERIZATION  01/2015   Coronary angiography on 02/17/15 demonstrated the left main coronary artery to have mild 30-40% stenosis, 100% LAD stenosis with right to left collaterals and faint left to left collaterals, small nondominant left circumflex, small to medium caliber first obtuse marginal branch with 20% ostial and proximal stenosis. The RCA was noted to have diffuse disease.  Marland Kitchen. CARDIAC CATHETERIZATION N/A 11/11/2016   Procedure: Left Heart Cath and Cors/Grafts Angiography;  Surgeon: Marykay Lexavid W Harding, MD;  [facility];  Service: Cardiovascular;  Laterality: N/A;  .  CORONARY ARTERY BYPASS GRAFT  01/2015  . INGUINAL HERNIA REPAIR Bilateral   . VALVE REPLACEMENT  01/2015   Tricuspid Valve Replacement     Current Outpatient Prescriptions  Medication Sig Dispense Refill  . ALPRAZolam (XANAX) 1 MG tablet Take 1 mg by mouth 4 (four) times daily as needed for anxiety.   2  . amiodarone (PACERONE) 200 MG tablet Take 1 tablet (200 mg total) by mouth daily. 90 tablet 3  . aspirin EC 81 MG tablet Take 81 mg by mouth daily.     Marland Kitchen. atorvastatin (LIPITOR) 40 MG tablet Take 1 tablet (40 mg total) by mouth daily. 90 tablet 3  . carvedilol (COREG) 3.125 MG tablet Take 3.125 mg by mouth 2 (two) times daily with a meal.   3  . clopidogrel (PLAVIX) 75 MG tablet Take 75 mg by mouth daily.   3  . furosemide (LASIX) 20 MG tablet Take 1 tablet (20 mg total) by mouth daily. 90 tablet 3  . HYDROcodone-acetaminophen (NORCO) 7.5-325 MG tablet Take 1 tablet by mouth 4 (four) times daily as needed for moderate pain.   0  . nitroGLYCERIN (NITROSTAT) 0.4 MG SL tablet Place 1 tablet (0.4 mg total) under the tongue every 5 (five) minutes as needed for chest pain. 90 tablet 3  . potassium chloride SA (K-DUR,KLOR-CON) 20 MEQ tablet Take 1 tablet (20 mEq total) by mouth daily. 30 tablet 6  . vitamin C (ASCORBIC ACID) 500 MG tablet Take 500 mg  by mouth daily.     No current facility-administered medications for this visit.     Allergies:   Patient has no known allergies.    Social History:  The patient  reports that he has been smoking Cigarettes.  He has a 180.00 pack-year smoking history. He has never used smokeless tobacco. He reports that he drinks about 0.6 oz of alcohol per week . He reports that he does not use drugs.   Family History:  The patient's family history includes Heart disease in his father.    ROS: All other systems are reviewed and negative. Unless otherwise mentioned in H&P    PHYSICAL EXAM: VS:  There were no vitals taken for this visit. , BMI There is no  height or weight on file to calculate BMI. GEN: Well nourished, well developed, in no acute distress  HEENT: normal  Neck: no JVD, carotid bruits, or masses Cardiac: ***RRR; no murmurs, rubs, or gallops,no edema  Respiratory:  clear to auscultation bilaterally, normal work of breathing GI: soft, nontender, nondistended, + BS MS: no deformity or atrophy  Skin: warm and dry, no rash Neuro:  Strength and sensation are intact Psych: euthymic mood, full affect   EKG:  EKG {ACTION; IS/IS [id number]} ordered today. The ekg ordered today demonstrates ***   Recent Labs: 02/05/2017: BUN 9; Creatinine, Ser 0.98; Hemoglobin 13.8; Platelets 154; Potassium 4.5; Sodium 135    Lipid Panel No results found for: CHOL, TRIG, HDL, CHOLHDL, VLDL, LDLCALC, LDLDIRECT    Wt Readings from Last 3 Encounters:  02/04/17 131 lb (59.4 kg)  11/11/16 127 lb (57.6 kg)  11/08/16 129 lb (58.5 kg)      Other studies Reviewed: Additional studies/ records that were reviewed today include: ***. Review of the above records demonstrates: ***   ASSESSMENT AND PLAN:  1.  ***   Current medicines are reviewed at length with the patient today.    Labs/ tests ordered today include: *** Bettey Mare. Liborio Nixon, ANP, AACC   03/06/2017 7:32 AM    [facility] [address] [place], [place] [address] Phone: [phone]; Fax: [phone]

## 2017-03-10 DIAGNOSIS — Z79891 Long term (current) use of opiate analgesic: Secondary | ICD-10-CM | POA: Diagnosis not present

## 2017-03-10 DIAGNOSIS — Z6820 Body mass index (BMI) 20.0-20.9, adult: Secondary | ICD-10-CM | POA: Diagnosis not present

## 2017-03-10 DIAGNOSIS — G894 Chronic pain syndrome: Secondary | ICD-10-CM | POA: Diagnosis not present

## 2017-05-21 DIAGNOSIS — Z6821 Body mass index (BMI) 21.0-21.9, adult: Secondary | ICD-10-CM | POA: Diagnosis not present

## 2017-05-21 DIAGNOSIS — Z0000 Encounter for general adult medical examination without abnormal findings: Secondary | ICD-10-CM | POA: Diagnosis not present

## 2017-05-21 DIAGNOSIS — Z1389 Encounter for screening for other disorder: Secondary | ICD-10-CM | POA: Diagnosis not present

## 2017-05-23 ENCOUNTER — Emergency Department (HOSPITAL_COMMUNITY): Payer: Medicare Other

## 2017-05-23 ENCOUNTER — Encounter (HOSPITAL_COMMUNITY): Payer: Self-pay | Admitting: Emergency Medicine

## 2017-05-23 ENCOUNTER — Observation Stay (HOSPITAL_COMMUNITY)
Admission: EM | Admit: 2017-05-23 | Discharge: 2017-05-24 | Disposition: A | Discharge status: Home / Self Care | Payer: Medicare Other | Attending: Internal Medicine | Admitting: Internal Medicine

## 2017-05-23 DIAGNOSIS — J449 Chronic obstructive pulmonary disease, unspecified: Secondary | ICD-10-CM | POA: Diagnosis not present

## 2017-05-23 DIAGNOSIS — R079 Chest pain, unspecified: Secondary | ICD-10-CM | POA: Diagnosis not present

## 2017-05-23 DIAGNOSIS — Z951 Presence of aortocoronary bypass graft: Secondary | ICD-10-CM

## 2017-05-23 DIAGNOSIS — F17210 Nicotine dependence, cigarettes, uncomplicated: Secondary | ICD-10-CM | POA: Insufficient documentation

## 2017-05-23 DIAGNOSIS — Z79899 Other long term (current) drug therapy: Secondary | ICD-10-CM | POA: Diagnosis not present

## 2017-05-23 DIAGNOSIS — Z7982 Long term (current) use of aspirin: Secondary | ICD-10-CM | POA: Diagnosis not present

## 2017-05-23 DIAGNOSIS — R0789 Other chest pain: Secondary | ICD-10-CM | POA: Diagnosis present

## 2017-05-23 DIAGNOSIS — I252 Old myocardial infarction: Secondary | ICD-10-CM | POA: Diagnosis not present

## 2017-05-23 DIAGNOSIS — R718 Other abnormality of red blood cells: Secondary | ICD-10-CM | POA: Diagnosis present

## 2017-05-23 DIAGNOSIS — Z7902 Long term (current) use of antithrombotics/antiplatelets: Secondary | ICD-10-CM | POA: Diagnosis not present

## 2017-05-23 DIAGNOSIS — F419 Anxiety disorder, unspecified: Secondary | ICD-10-CM | POA: Diagnosis present

## 2017-05-23 DIAGNOSIS — I714 Abdominal aortic aneurysm, without rupture, unspecified: Secondary | ICD-10-CM | POA: Diagnosis present

## 2017-05-23 DIAGNOSIS — E7800 Pure hypercholesterolemia, unspecified: Secondary | ICD-10-CM | POA: Diagnosis present

## 2017-05-23 DIAGNOSIS — R0602 Shortness of breath: Secondary | ICD-10-CM | POA: Diagnosis not present

## 2017-05-23 DIAGNOSIS — I2510 Atherosclerotic heart disease of native coronary artery without angina pectoris: Secondary | ICD-10-CM | POA: Insufficient documentation

## 2017-05-23 DIAGNOSIS — I25119 Atherosclerotic heart disease of native coronary artery with unspecified angina pectoris: Secondary | ICD-10-CM | POA: Diagnosis present

## 2017-05-23 DIAGNOSIS — R05 Cough: Secondary | ICD-10-CM | POA: Diagnosis not present

## 2017-05-23 DIAGNOSIS — Z720 Tobacco use: Secondary | ICD-10-CM | POA: Diagnosis present

## 2017-05-23 LAB — BASIC METABOLIC PANEL
Anion gap: 9 (ref 5–15)
BUN: 13 mg/dL (ref 6–20)
CHLORIDE: 104 mmol/L (ref 101–111)
CO2: 25 mmol/L (ref 22–32)
CREATININE: 0.95 mg/dL (ref 0.61–1.24)
Calcium: 9.5 mg/dL (ref 8.9–10.3)
Glucose, Bld: 87 mg/dL (ref 65–99)
POTASSIUM: 3.8 mmol/L (ref 3.5–5.1)
SODIUM: 138 mmol/L (ref 135–145)

## 2017-05-23 LAB — CBC
HCT: 42.4 % (ref 39.0–52.0)
Hemoglobin: 14.5 g/dL (ref 13.0–17.0)
MCH: 35.1 pg — ABNORMAL HIGH (ref 26.0–34.0)
MCHC: 34.2 g/dL (ref 30.0–36.0)
MCV: 102.7 fL — AB (ref 78.0–100.0)
PLATELETS: 187 10*3/uL (ref 150–400)
RBC: 4.13 MIL/uL — AB (ref 4.22–5.81)
RDW: 13.7 % (ref 11.5–15.5)
WBC: 8.2 10*3/uL (ref 4.0–10.5)

## 2017-05-23 LAB — I-STAT TROPONIN, ED: Troponin i, poc: 0.01 ng/mL (ref 0.00–0.08)

## 2017-05-23 LAB — MAGNESIUM: MAGNESIUM: 2.2 mg/dL (ref 1.7–2.4)

## 2017-05-23 IMAGING — DX DG CHEST 2V
2 series · 2 of 2 positions shown · non-contrast
Comparison: [DATE].

CLINICAL DATA: Midsternal chest pain since last night. Dizziness.
Shortness of breath. Chronic cough. Smoker.

EXAM:
CHEST  2 VIEW

[chest pa]
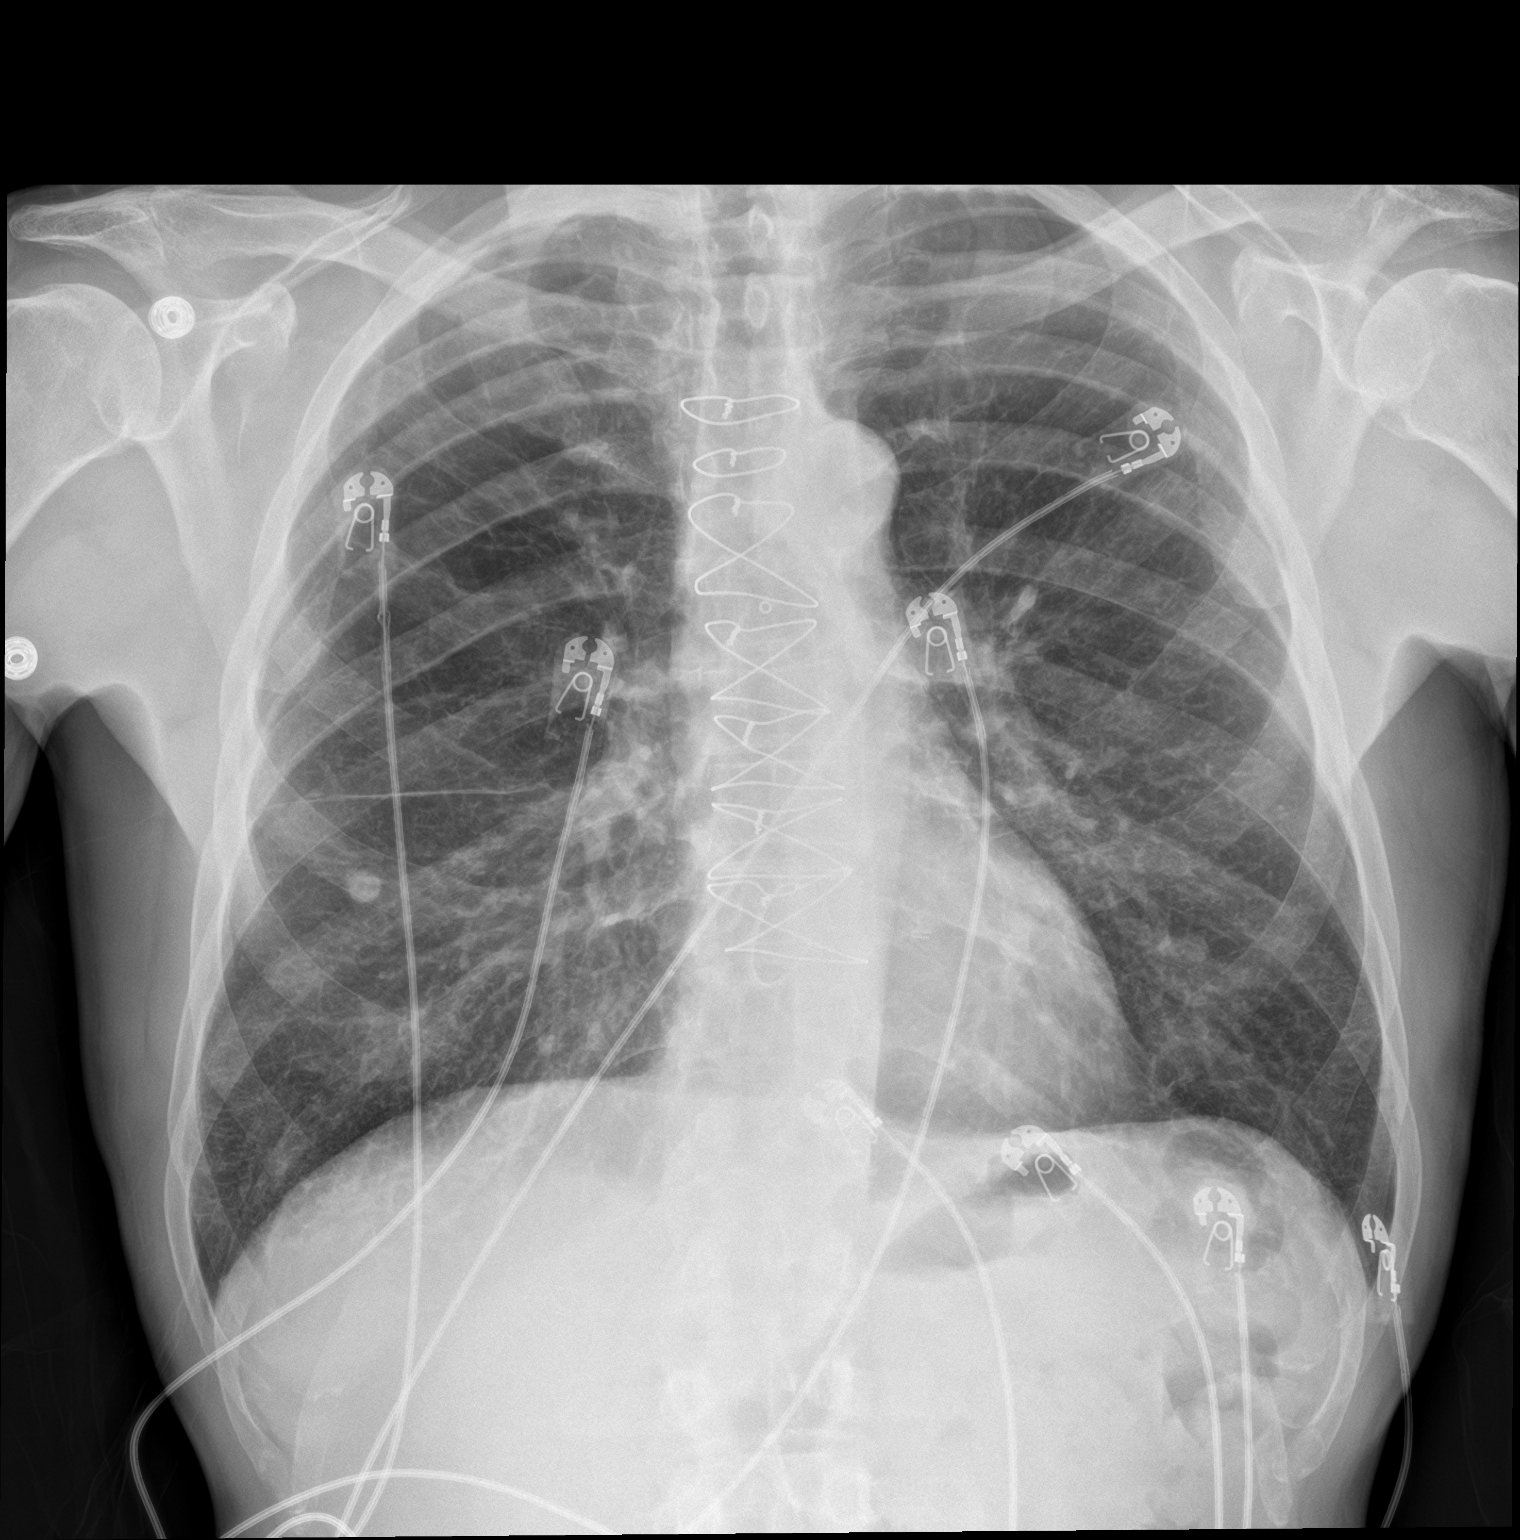

[chest lat]
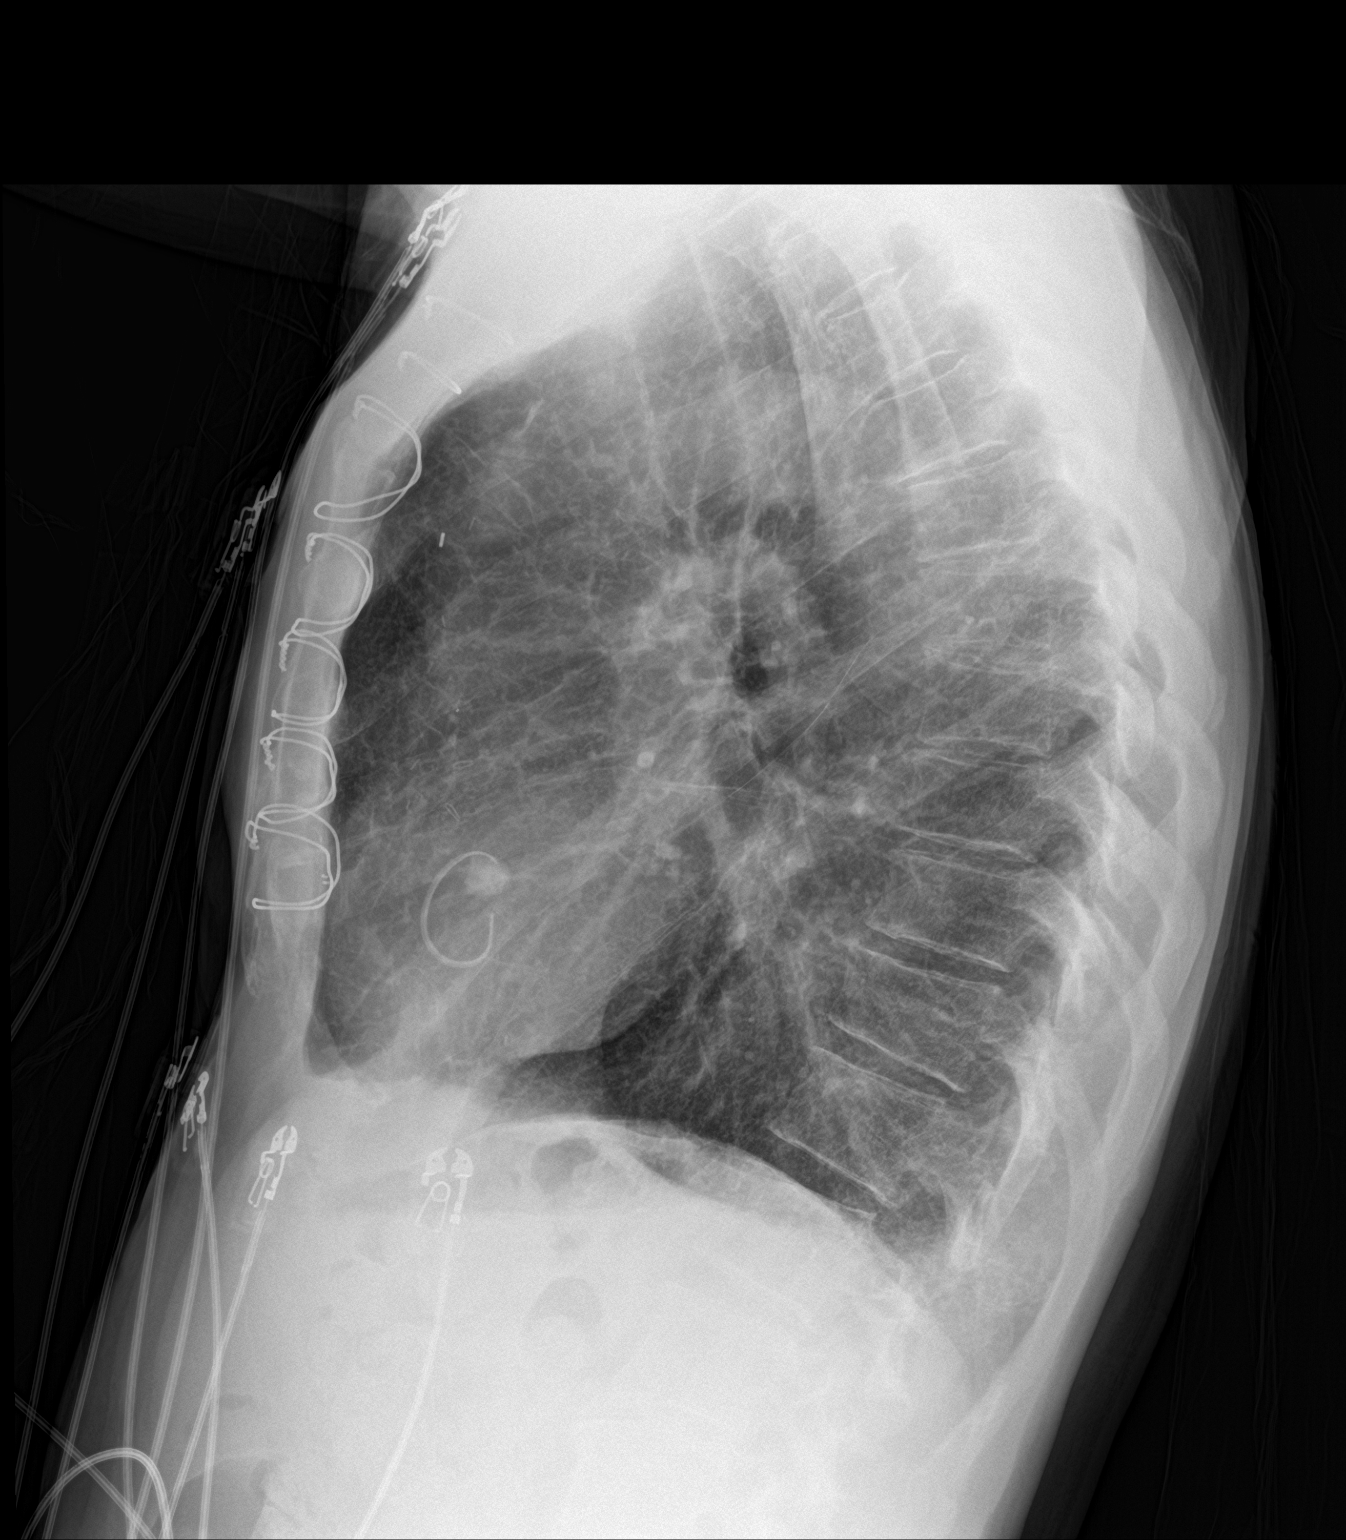

[2 of 2 positions shown; findings below may reference images not displayed]

FINDINGS: Normal sized heart. The lungs remain hyperexpanded with mildly
prominent interstitial markings. Stable median sternotomy wires and
prosthetic heart valve. Stable right middle lobe calcified
granuloma. Unremarkable bones.
IMPRESSION: No acute abnormality.  Stable changes of COPD.

## 2017-05-23 IMAGING — CT CT ANGIO CHEST-ABD-PELV FOR DISSECTION W/ AND WO/W CM
2 of 6 series · 12 of 46 positions shown, 14 images · IV contrast (Isovue)
Comparison: Chest radiograph dated [DATE] chest CT dated
[DATE]

CLINICAL DATA: 66-year-old male with history of abdominal aortic
aneurysm presenting with sharp midsternal chest pain.

EXAM:
CT ANGIOGRAPHY CHEST, ABDOMEN AND PELVIS
TECHNIQUE: Multidetector CT imaging through the chest, abdomen and pelvis was
performed using the standard protocol during bolus administration of
intravenous contrast. Multiplanar reconstructed images and MIPs were
obtained and reviewed to evaluate the vascular anatomy.
CONTRAST:  100 cc Isovue 370

[Series 8: dissection axial arterial · axial · arterial · 0.71mm/px · z∈[+724,+1246]mm · 9 of 212 slices shown, 11 images]
[im 19/212  soft-tissue]
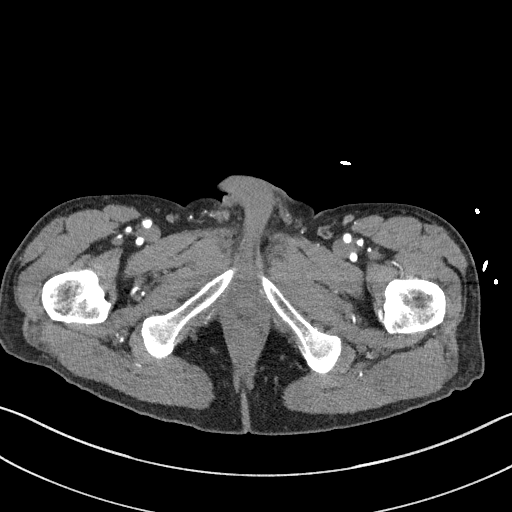
[im 19/212  bone]
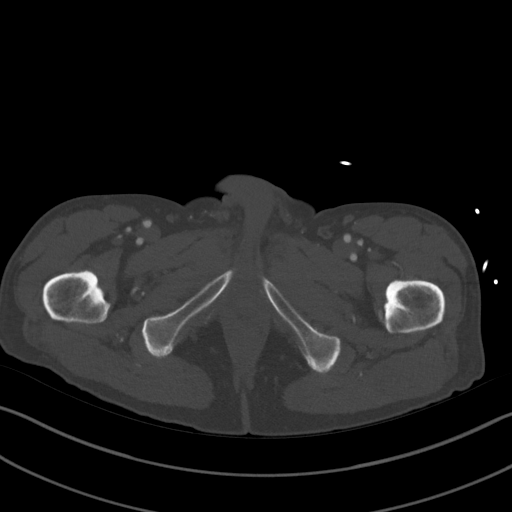
[im 37/212  soft-tissue]
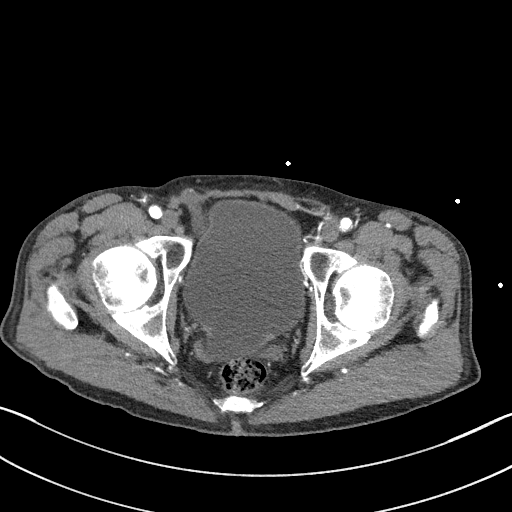
[im 65/212  soft-tissue]
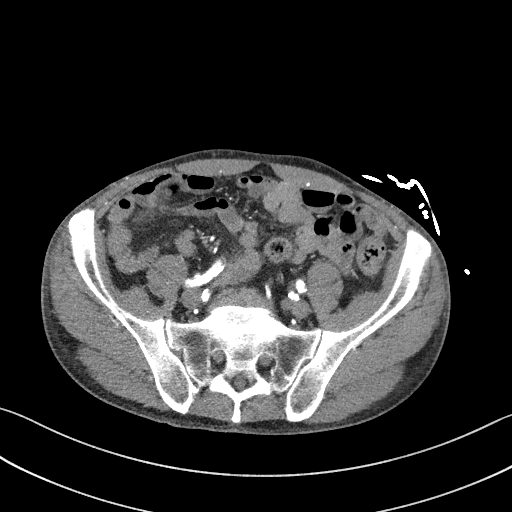
[im 83/212  soft-tissue]
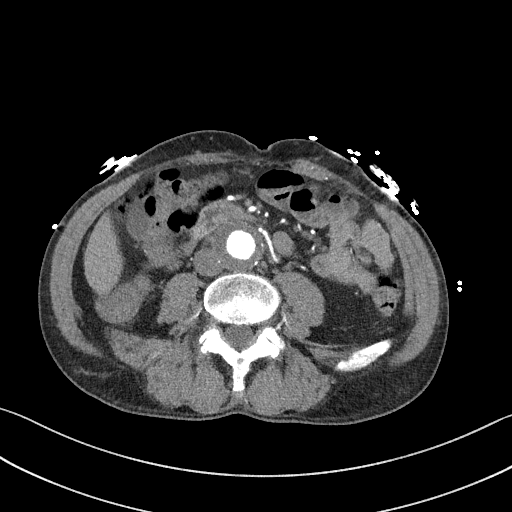
[im 111/212  soft-tissue]
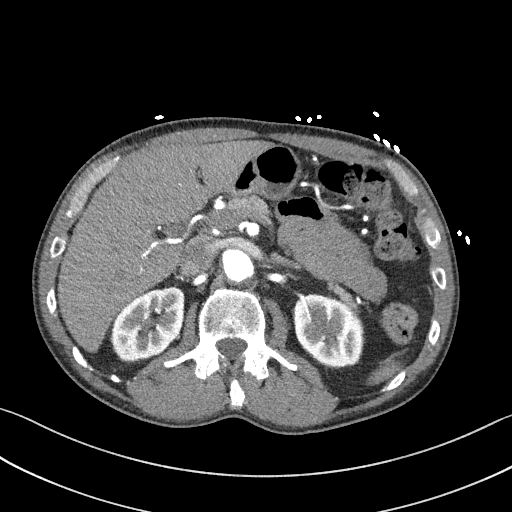
[im 129/212  soft-tissue]
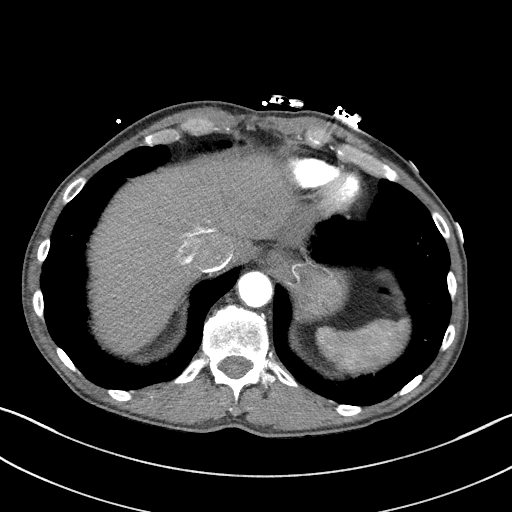
[im 147/212  soft-tissue]
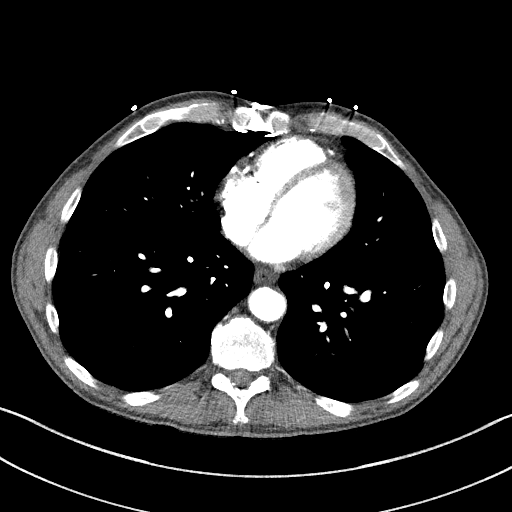
[im 175/212  soft-tissue]
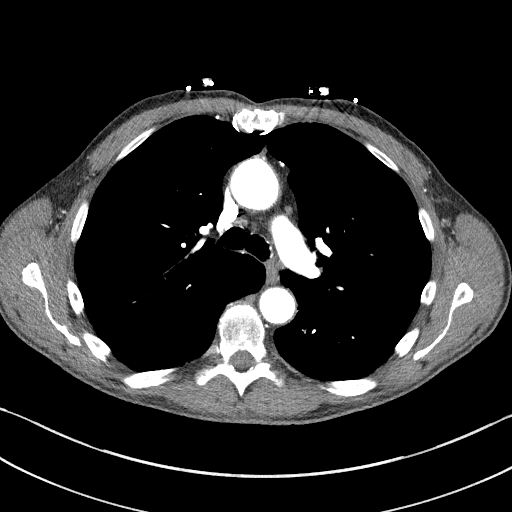
[im 193/212  soft-tissue]
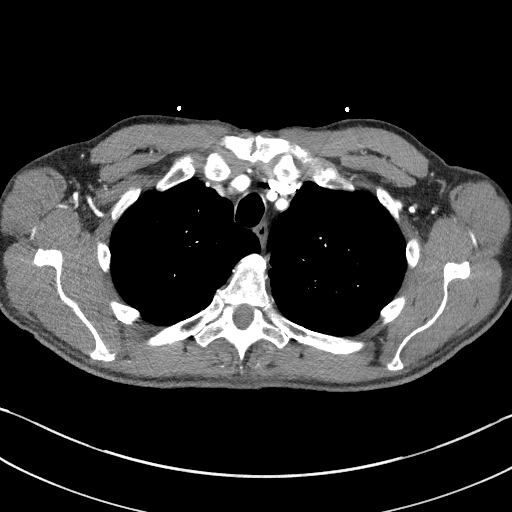
[im 193/212  bone]
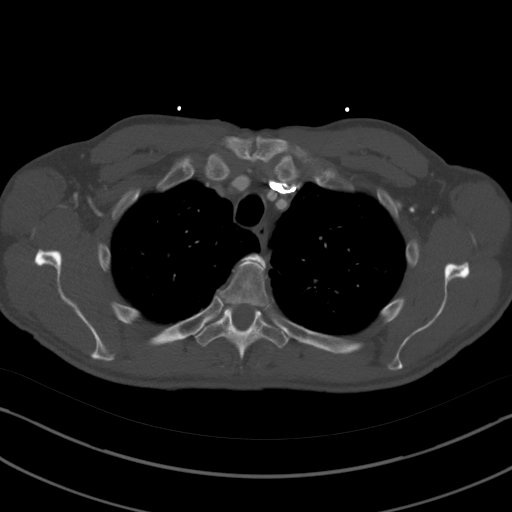

[Series 12: coronals · coronal · 0.79mm/px · 3 of 140 slices shown]
[im 35/140  soft-tissue]
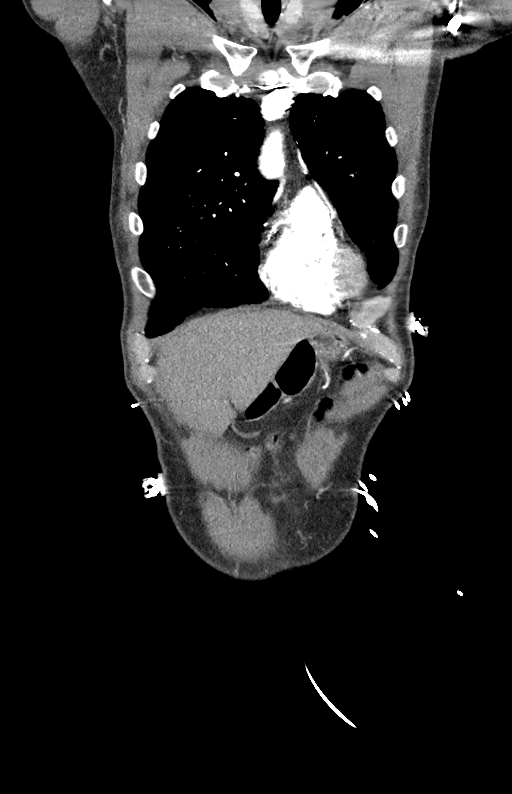
[im 70/140  soft-tissue]
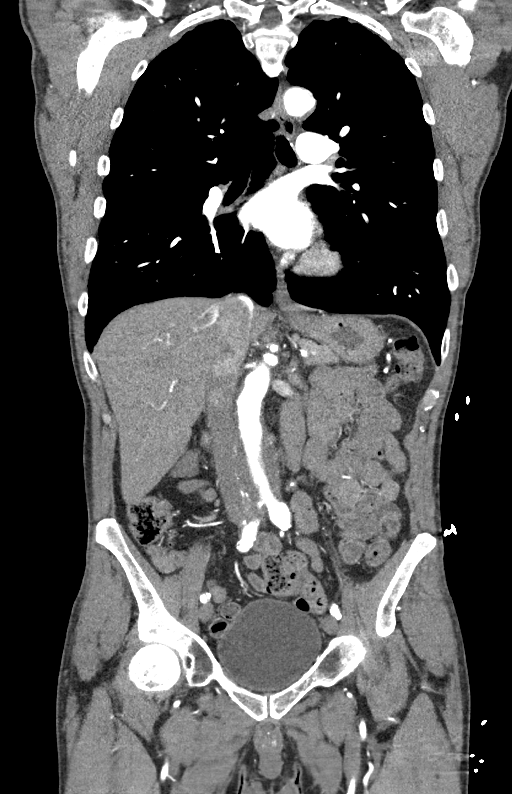
[im 105/140  soft-tissue]
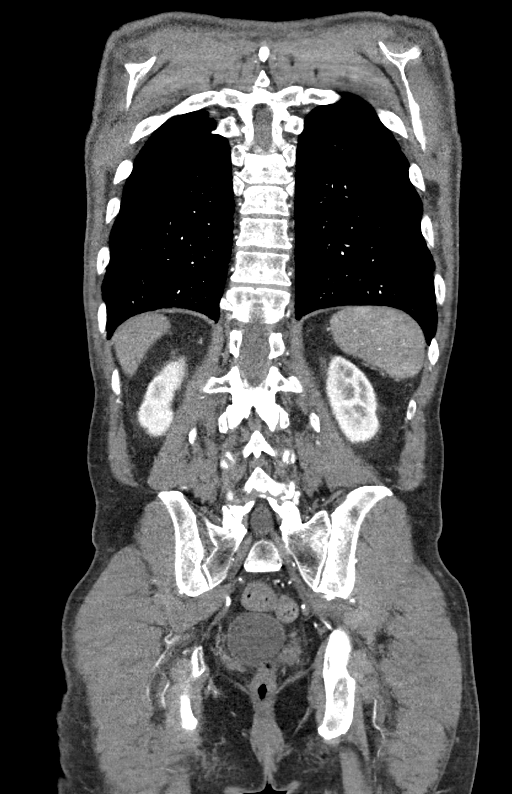

[12 of 46 positions shown; findings below may reference images not displayed]

FINDINGS: CTA CHEST FINDINGS

Cardiovascular: There is no cardiomegaly or pericardial effusion.
There is coronary vascular calcification involving the LAD. There is
minimal plaque along the thoracic aorta. There is no aneurysmal
dilatation or evidence of dissection. The origins of the great
vessels of the aortic arch appear patent. There is no CT evidence of
pulmonary embolism.

Mediastinum/Nodes: Right hilar adenopathy measures 16 mm in short
axis. This is of indeterminate etiology or clinical significance. No
mediastinal adenopathy. The esophagus and the thyroid gland are
grossly unremarkable.

Lungs/Pleura: There is advanced centrilobular emphysema. There is a
1 cm right middle lobe subpleural calcified granuloma and a 5 mm
partially calcified probable granuloma (series 10, image 38), stable
compared to the study of [13]. A 10 mm nodular density along the
right major fissure (series 10, image 25) appears similar to the CT
of [13]. There is no focal consolidation. No pleural effusion or
pneumothorax. The central airways are patent.

Musculoskeletal: There is no axillary adenopathy. The chest wall
soft tissues are unremarkable. There is mild degenerative changes of
the spine. Median sternotomy wires noted. No acute osseous
pathology.

Review of the MIP images confirms the above findings.

CTA ABDOMEN AND PELVIS FINDINGS

VASCULAR

Aorta: There is a partially thrombosed fusiform infrarenal abdominal
aortic aneurysm measuring up to 4.1 cm in greatest transverse
diameter (previously 3.5 cm) and approximately 9.7 cm in length.
There has been interval increase in the size of the intramural clot
within the aneurysm. The aneurysm starts approximately at the level
of the superior endplate of the L3 with inferior extension into the
right common iliac artery. There is no inflammatory changes or
stranding surrounding the aorta. No extravasation of contrast
outside of the lumen of the aorta. There is no dissection.

Celiac: Patent without evidence of aneurysm, dissection, vasculitis
or significant stenosis.

SMA: Patent without evidence of aneurysm, dissection, vasculitis or
significant stenosis. There is a replaced right hepatic artery
anatomy from the SMA.

Renals: Both renal arteries are patent without evidence of aneurysm,
dissection, vasculitis, fibromuscular dysplasia or significant
stenosis.

IMA: The origin of the IMA is not visualized. Of the IMA however
appears patent.

Inflow: There is a partially thrombosed aneurysm of the right common
iliac artery measuring up to 2.2 cm in greatest diameter. There is
advanced atherosclerotic calcification of the common, internal, and
external iliac arteries bilaterally. These vessels however remain
patent. No dissection.

Veins: No obvious venous abnormality within the limitations of this
arterial phase study.

Review of the MIP images confirms the above findings.

NON-VASCULAR

No intra-abdominal free air or free fluid.

Hepatobiliary: No focal liver abnormality is seen. No gallstones,
gallbladder wall thickening, or biliary dilatation.

Pancreas: Unremarkable. No pancreatic ductal dilatation or
surrounding inflammatory changes.

Spleen: Normal in size without focal abnormality.

Adrenals/Urinary Tract: Adrenal glands are unremarkable. Kidneys are
normal, without renal calculi, focal lesion, or hydronephrosis.
Bladder is unremarkable.

Stomach/Bowel: There is moderate stool throughout the colon. No
evidence of bowel obstruction or active inflammation. Normal
appendix.

Lymphatic: No adenopathy.

Reproductive: The prostate and seminal vesicles are grossly
unremarkable

Other: None

Musculoskeletal: Bilateral L5 pars defects with grade 1 L5-S1
anterolisthesis. No acute fracture.

Review of the MIP images confirms the above findings.
IMPRESSION: 1. Partially thrombosed fusiform infrarenal aortic aneurysm
measuring approximately 4.1 cm in greatest axial diameter, increased
compared to the prior CT of [13]. No evidence of dissection, or
aneurysmal leak. Recommend followup by ultrasound in 1 year. This
recommendation follows ACR consensus guidelines: White Paper of the
ACR Incidental Findings Committee II on Vascular Findings. [HOSPITAL] [13]; [DATE].
2. Partially thrombosed right common iliac artery aneurysm measuring
approximately 2.2 cm in greatest diameter.
3. No aortic dissection.  No CT evidence of pulmonary embolism.
4. Severe centrilobular emphysema. Multiple pulmonary nodules appear
stable.
5. Aortic Atherosclerosis ([13]-[13]) and Emphysema ([13]-[13]).

## 2017-05-23 MED ORDER — ASPIRIN 81 MG PO CHEW
324.0000 mg | CHEWABLE_TABLET | Freq: Once | ORAL | Status: AC
  Administered 2017-05-23: 324 mg via ORAL
  Filled 2017-05-23: qty 4

## 2017-05-23 MED ORDER — NICOTINE 21 MG/24HR TD PT24: 21.0000 mg | MEDICATED_PATCH | Freq: Once | TRANSDERMAL | Status: DC

## 2017-05-23 MED ORDER — IOPAMIDOL (ISOVUE-370) INJECTION 76%
100.0000 mL | Freq: Once | INTRAVENOUS | Status: AC | PRN
  Administered 2017-05-23: 100 mL via INTRAVENOUS

## 2017-05-23 MED ORDER — IPRATROPIUM-ALBUTEROL 0.5-2.5 (3) MG/3ML IN SOLN
3.0000 mL | RESPIRATORY_TRACT | Status: DC
  Filled 2017-05-23: qty 3

## 2017-05-23 MED ORDER — ALPRAZOLAM 1 MG PO TABS
1.0000 mg | ORAL_TABLET | Freq: Four times a day (QID) | ORAL | Status: DC | PRN
  Administered 2017-05-23: 1 mg via ORAL
  Filled 2017-05-23: qty 1

## 2017-05-23 MED ORDER — LEVALBUTEROL HCL 0.63 MG/3ML IN NEBU: 0.6300 mg | INHALATION_SOLUTION | Freq: Four times a day (QID) | RESPIRATORY_TRACT | Status: DC | PRN

## 2017-05-23 MED ORDER — ACETAMINOPHEN 325 MG PO TABS: 650.0000 mg | ORAL_TABLET | ORAL | Status: DC | PRN

## 2017-05-23 MED ORDER — HYDROMORPHONE HCL 1 MG/ML IJ SOLN
0.5000 mg | Freq: Once | INTRAMUSCULAR | Status: AC
  Administered 2017-05-23: 0.5 mg via INTRAVENOUS
  Filled 2017-05-23: qty 1

## 2017-05-23 MED ORDER — FENTANYL CITRATE (PF) 100 MCG/2ML IJ SOLN
50.0000 ug | Freq: Once | INTRAMUSCULAR | Status: AC
  Administered 2017-05-23: 50 ug via INTRAVENOUS
  Filled 2017-05-23: qty 2

## 2017-05-23 MED ORDER — NITROGLYCERIN 2 % TD OINT
0.5000 [in_us] | TOPICAL_OINTMENT | Freq: Four times a day (QID) | TRANSDERMAL | Status: DC
  Administered 2017-05-23 – 2017-05-24 (×2): 0.5 [in_us] via TOPICAL
  Filled 2017-05-23 (×2): qty 1

## 2017-05-23 MED ORDER — ONDANSETRON HCL 4 MG PO TABS: 4.0000 mg | ORAL_TABLET | Freq: Four times a day (QID) | ORAL | Status: DC | PRN

## 2017-05-23 MED ORDER — FAMOTIDINE 20 MG PO TABS
20.0000 mg | ORAL_TABLET | Freq: Two times a day (BID) | ORAL | Status: DC
  Administered 2017-05-23 – 2017-05-24 (×2): 20 mg via ORAL
  Filled 2017-05-23 (×2): qty 1

## 2017-05-23 MED ORDER — ASPIRIN EC 81 MG PO TBEC
81.0000 mg | DELAYED_RELEASE_TABLET | Freq: Every day | ORAL | Status: DC
  Administered 2017-05-24: 81 mg via ORAL
  Filled 2017-05-23: qty 1

## 2017-05-23 MED ORDER — NICOTINE 21 MG/24HR TD PT24
21.0000 mg | MEDICATED_PATCH | Freq: Every day | TRANSDERMAL | Status: DC
  Administered 2017-05-24: 21 mg via TRANSDERMAL
  Filled 2017-05-23: qty 1

## 2017-05-23 MED ORDER — IPRATROPIUM BROMIDE 0.02 % IN SOLN: 0.5000 mg | Freq: Three times a day (TID) | RESPIRATORY_TRACT | Status: DC

## 2017-05-23 MED ORDER — HYDROCODONE-ACETAMINOPHEN 7.5-325 MG PO TABS: 1.0000 | ORAL_TABLET | Freq: Four times a day (QID) | ORAL | Status: DC | PRN

## 2017-05-23 MED ORDER — NICOTINE 21 MG/24HR TD PT24
21.0000 mg | MEDICATED_PATCH | Freq: Once | TRANSDERMAL | Status: DC
  Administered 2017-05-23: 21 mg via TRANSDERMAL

## 2017-05-23 MED ORDER — ONDANSETRON HCL 4 MG/2ML IJ SOLN: 4.0000 mg | Freq: Four times a day (QID) | INTRAMUSCULAR | Status: DC | PRN

## 2017-05-23 MED ORDER — FENTANYL CITRATE (PF) 100 MCG/2ML IJ SOLN
100.0000 ug | INTRAMUSCULAR | Status: DC | PRN
Start: 2017-05-23 — End: 2017-05-24

## 2017-05-23 MED ORDER — NITROGLYCERIN 0.4 MG SL SUBL
0.4000 mg | SUBLINGUAL_TABLET | SUBLINGUAL | Status: DC | PRN
  Administered 2017-05-23 (×2): 0.4 mg via SUBLINGUAL
  Filled 2017-05-23 (×2): qty 1

## 2017-05-23 MED ORDER — ENOXAPARIN SODIUM 40 MG/0.4ML ~~LOC~~ SOLN
40.0000 mg | SUBCUTANEOUS | Status: DC
  Administered 2017-05-23: 40 mg via SUBCUTANEOUS
  Filled 2017-05-23: qty 0.4

## 2017-05-23 MED ORDER — LEVALBUTEROL HCL 0.63 MG/3ML IN NEBU: 0.6300 mg | INHALATION_SOLUTION | Freq: Three times a day (TID) | RESPIRATORY_TRACT | Status: DC

## 2017-05-23 MED ORDER — IPRATROPIUM BROMIDE 0.02 % IN SOLN: 0.5000 mg | Freq: Four times a day (QID) | RESPIRATORY_TRACT | Status: DC | PRN

## 2017-05-23 NOTE — Progress Notes (Signed)
Never received neb in er returned med to upstairs [facility]eb

## 2017-05-23 NOTE — H&P (Signed)
History and Physical    [name] ZOX:[id number] DOB: 1951-07-09 DOA: 05/23/2017  PCP: Wilson SingerGosrani, Nimish C, MD   Patient coming from: Home.  I have personally briefly reviewed patient's old medical records in [facility]  Chief Complaint: Chest pain.  HPI: Sean Daubndrew R Ballard is a 66 y.o. male with medical history significant of AAA, anxiety, arthritis, COPD, CAD, history of MI, CABG/tricuspid valve replacement in April 2016, hyperlipidemia not on statin, pulmonary nodule who is coming to the emergency department with complaints of chest pain since Thursday evening, as stabbing and burning-like, radiated to his left shoulder and arm, associated with nausea, dizziness and dyspnea. He states that his symptoms persisted through the evening, but subsequently he went to bed and woke up around 0400 with similar symptoms. He continued having symptoms through the day and call his daughter about [id number] and subsequently they called EMS, who suggested him to take four 81 mg aspirins and use sublingual nitroglycerin. The patient states that he understood to use for sublingual nitroglycerin, which subsequently relieved his pain. He denies palpitations, diaphoresis, recent PND, orthopnea or pitting edema of the lower extremities. He denies abdominal pain, emesis, diarrhea, constipation, melena or hematochezia. He denies dysuria, frequency or hematuria. He denies polyuria, polydipsia or blurred vision. His wife states that his blood glucoses have been normal to low 100s since he started using metformin recently at the suggestion of his primary care provider.  He had an angiogram on 11/11/2016 which showed widely patent grafts and native circumflex with known occlusion of the LAD and now occluded RCA. No obvious culprit lesion to explain the patient's angina besides the short segment of the proximal LAD isolated by proximal occlusion and distal 95% stenosis. No PCI targets. (Please see full conclusions of cardiac  cath below)  ED Course: Initial vital signs the patient was afebrile, pulse 77, respirations 14, blood pressure 133/79 mmHg and O2 sat 98% on room air. He received supplemental oxygen, a DuoNeb, sublingual nitroglycerin, IV fentanyl 100 g and IV hydromorphone 0.5 mg with improvement of his symptoms.  Workup shows an EKG with sinus rhythm and anterior infarct age undetermined, basically unchanged from previous. Troponin level was negative. CBC was normal, except for elevated MCV and MCH. BMP was normal.  Imaging: Chest radiograph shows stable COPD. CT angiogram chest/abdomen/pelvis did not show any acute pathological finding, but shows stable AAA and other chronic findings. Please see images and full radiology reports for further detail.   Review of Systems: As per HPI otherwise 10 point review of systems negative.    Past Medical History:  Diagnosis Date  . AAA (abdominal aortic aneurysm) (HCC)   . Anxiety   . Arthritis   . COPD (chronic obstructive pulmonary disease) (HCC)   . 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 MI (myocardial infarction) (HCC)   . Pulmonary nodule    benign    Past Surgical History:  Procedure Laterality Date  . CARDIAC CATHETERIZATION  01/2015   Coronary angiography on 02/17/15 demonstrated the left main coronary artery to have mild 30-40% stenosis, 100% LAD stenosis with right to left collaterals and faint left to left collaterals, small nondominant left circumflex, small to medium caliber first obtuse marginal branch with 20% ostial and proximal stenosis. The RCA was noted to have diffuse disease.  Marland Kitchen. CARDIAC CATHETERIZATION N/A 11/11/2016   Procedure:  Left Heart Cath and Cors/Grafts Angiography;  Surgeon: Marykay Lex, MD;  Location: [facility] INVASIVE CV LAB;  Service: Cardiovascular;  Laterality: N/A;  . CORONARY ARTERY BYPASS  GRAFT  01/2015  . INGUINAL HERNIA REPAIR Bilateral   . VALVE REPLACEMENT  01/2015   Tricuspid Valve Replacement     reports that he has been smoking Cigarettes.  He has a 120.00 pack-year smoking history. He has never used smokeless tobacco. He reports that he drinks about 0.6 oz of alcohol per week . He reports that he does not use drugs.  No Known Allergies  Family History  Problem Relation Age of Onset  . Heart disease Father   . Heart attack Sister        Died in her 53s  . CAD Brother   . Heart attack Brother   . CAD Brother   . Heart attack Brother     Prior to Admission medications   Medication Sig Start Date End Date Taking? Authorizing Provider  ALPRAZolam Prudy Feeler) 1 MG tablet Take 1 mg by mouth 4 (four) times daily as needed for anxiety.  09/17/16  Yes [provider]  aspirin EC 81 MG tablet Take 81 mg by mouth daily.    Yes [provider]  HYDROcodone-acetaminophen (NORCO) 7.5-325 MG tablet Take 1 tablet by mouth every 6 (six) hours as needed for moderate pain.  08/16/16  Yes [provider]  nitroGLYCERIN (NITROSTAT) 0.4 MG SL tablet Place 1 tablet (0.4 mg total) under the tongue every 5 (five) minutes as needed for chest pain. 10/24/16 05/23/17 Yes Jodelle Gross, NP    Physical Exam: Vitals:   05/23/17 1954 05/23/17 2030 05/23/17 2120 05/23/17 2246  BP: (!) 148/71 117/72 117/74 (!) 99/54  Pulse: 80 79 76 83  Resp: 15 20 16 20   Temp:    98.1 F (36.7 C)  TempSrc:    Oral  SpO2: 96% 100% 95% 96%  Weight:    59.6 kg (131 lb 4.8 oz)  Height:    5\' 7"  (1.702 m)    Constitutional: NAD, calm, comfortable Eyes: PERRL, lids and conjunctivae normal ENMT: Mucous membranes are moist. Posterior pharynx clear of any exudate or lesions. Neck: normal, supple, no masses, no thyromegaly Respiratory: Good air movement with bilateral diffused crackles and mild wheezing. Normal respiratory effort. No accessory muscle use.  Cardiovascular: Regular  rate and rhythm, no murmurs / rubs / gallops. No extremity edema. 2+ pedal pulses. Left carotid bruit.  Abdomen: Soft, no tenderness, no masses palpated. No hepatosplenomegaly. Bowel sounds positive.  Musculoskeletal: no clubbing / cyanosis. Good ROM, no contractures. Normal muscle tone.  Skin: no significant rashes, lesions, ulcers on limited skin exam Neurologic: CN 2-12 grossly intact. Sensation intact, DTR normal. Strength 5/5 in all 4.  Psychiatric: Normal judgment and insight. Alert and oriented x 3. Normal mood.    Labs on Admission: I have personally reviewed following labs and imaging studies  CBC:  Recent Labs Lab 05/23/17 1701  WBC 8.2  HGB 14.5  HCT 42.4  MCV 102.7*  PLT 187   Basic Metabolic Panel:  Recent Labs Lab 05/23/17 1701 05/23/17 1702  NA 138  --   K 3.8  --   CL 104  --   CO2 25  --   GLUCOSE 87  --   BUN 13  --   CREATININE 0.95  --   CALCIUM 9.5  --   MG  --  2.2   GFR: Estimated Creatinine  Clearance: 62.7 mL/min (by C-G formula based on SCr of 0.95 mg/dL). Liver Function Tests: No results for input(s): AST, ALT, ALKPHOS, BILITOT, PROT, ALBUMIN in the last 168 hours. No results for input(s): LIPASE, AMYLASE in the last 168 hours. No results for input(s): AMMONIA in the last 168 hours. Coagulation Profile: No results for input(s): INR, PROTIME in the last 168 hours. Cardiac Enzymes: No results for input(s): CKTOTAL, CKMB, CKMBINDEX, TROPONINI in the last 168 hours. BNP (last 3 results) No results for input(s): PROBNP in the last 8760 hours. HbA1C: No results for input(s): HGBA1C in the last 72 hours. CBG: No results for input(s): GLUCAP in the last 168 hours. Lipid Profile: No results for input(s): CHOL, HDL, LDLCALC, TRIG, CHOLHDL, LDLDIRECT in the last 72 hours. Thyroid Function Tests: No results for input(s): TSH, T4TOTAL, FREET4, T3FREE, THYROIDAB in the last 72 hours. Anemia Panel: No results for input(s): VITAMINB12, FOLATE,  FERRITIN, TIBC, IRON, RETICCTPCT in the last 72 hours. Urine analysis:    Component Value Date/Time   COLORURINE YELLOW 01/23/2014 1530   APPEARANCEUR CLEAR 01/23/2014 1530   LABSPEC 1.010 01/23/2014 1530   PHURINE 6.0 01/23/2014 1530   GLUCOSEU NEGATIVE 01/23/2014 1530   HGBUR NEGATIVE 01/23/2014 1530   BILIRUBINUR NEGATIVE 01/23/2014 1530   KETONESUR NEGATIVE 01/23/2014 1530   PROTEINUR NEGATIVE 01/23/2014 1530   UROBILINOGEN 0.2 01/23/2014 1530   NITRITE NEGATIVE 01/23/2014 1530   LEUKOCYTESUR NEGATIVE 01/23/2014 1530    Radiological Exams on Admission: Dg Chest 2 View  Result Date: 05/23/2017 CLINICAL DATA:  Midsternal chest pain since last night. Dizziness. Shortness of breath. Chronic cough. Smoker. EXAM: CHEST  2 VIEW COMPARISON:  10/09/2016. FINDINGS: Normal sized heart. The lungs remain hyperexpanded with mildly prominent interstitial markings. Stable median sternotomy wires and prosthetic heart valve. Stable right middle lobe calcified granuloma. Unremarkable bones. IMPRESSION: No acute abnormality.  Stable changes of COPD. Electronically Signed   By: Beckie Salts M.D.   On: 05/23/2017 17:25   Ct Angio Chest/abd/pel For Dissection W And/or Wo Contrast  Result Date: 05/23/2017 CLINICAL DATA:  67 year old male with history of abdominal aortic aneurysm presenting with sharp midsternal chest pain. EXAM: CT ANGIOGRAPHY CHEST, ABDOMEN AND PELVIS TECHNIQUE: Multidetector CT imaging through the chest, abdomen and pelvis was performed using the standard protocol during bolus administration of intravenous contrast. Multiplanar reconstructed images and MIPs were obtained and reviewed to evaluate the vascular anatomy. CONTRAST:  100 cc Isovue 370 COMPARISON:  Chest radiograph dated 05/23/2017 chest CT dated 11/11/2008 FINDINGS: CTA CHEST FINDINGS Cardiovascular: There is no cardiomegaly or pericardial effusion. There is coronary vascular calcification involving the LAD. There is minimal plaque  along the thoracic aorta. There is no aneurysmal dilatation or evidence of dissection. The origins of the great vessels of the aortic arch appear patent. There is no CT evidence of pulmonary embolism. Mediastinum/Nodes: Right hilar adenopathy measures 16 mm in short axis. This is of indeterminate etiology or clinical significance. No mediastinal adenopathy. The esophagus and the thyroid gland are grossly unremarkable. Lungs/Pleura: There is advanced centrilobular emphysema. There is a 1 cm right middle lobe subpleural calcified granuloma and a 5 mm partially calcified probable granuloma (series 10, image 38), stable compared to the study of 2010. A 10 mm nodular density along the right major fissure (series 10, image 25) appears similar to the CT of 2010. There is no focal consolidation. No pleural effusion or pneumothorax. The central airways are patent. Musculoskeletal: There is no axillary adenopathy. The chest wall soft tissues  are unremarkable. There is mild degenerative changes of the spine. Median sternotomy wires noted. No acute osseous pathology. Review of the MIP images confirms the above findings. CTA ABDOMEN AND PELVIS FINDINGS VASCULAR Aorta: There is a partially thrombosed fusiform infrarenal abdominal aortic aneurysm measuring up to 4.1 cm in greatest transverse diameter (previously 3.5 cm) and approximately 9.7 cm in length. There has been interval increase in the size of the intramural clot within the aneurysm. The aneurysm starts approximately at the level of the superior endplate of the L3 with inferior extension into the right common iliac artery. There is no inflammatory changes or stranding surrounding the aorta. No extravasation of contrast outside of the lumen of the aorta. There is no dissection. Celiac: Patent without evidence of aneurysm, dissection, vasculitis or significant stenosis. SMA: Patent without evidence of aneurysm, dissection, vasculitis or significant stenosis. There is a  replaced right hepatic artery anatomy from the SMA. Renals: Both renal arteries are patent without evidence of aneurysm, dissection, vasculitis, fibromuscular dysplasia or significant stenosis. IMA: The origin of the IMA is not visualized. Of the IMA however appears patent. Inflow: There is a partially thrombosed aneurysm of the right common iliac artery measuring up to 2.2 cm in greatest diameter. There is advanced atherosclerotic calcification of the common, internal, and external iliac arteries bilaterally. These vessels however remain patent. No dissection. Veins: No obvious venous abnormality within the limitations of this arterial phase study. Review of the MIP images confirms the above findings. NON-VASCULAR No intra-abdominal free air or free fluid. Hepatobiliary: No focal liver abnormality is seen. No gallstones, gallbladder wall thickening, or biliary dilatation. Pancreas: Unremarkable. No pancreatic ductal dilatation or surrounding inflammatory changes. Spleen: Normal in size without focal abnormality. Adrenals/Urinary Tract: Adrenal glands are unremarkable. Kidneys are normal, without renal calculi, focal lesion, or hydronephrosis. Bladder is unremarkable. Stomach/Bowel: There is moderate stool throughout the colon. No evidence of bowel obstruction or active inflammation. Normal appendix. Lymphatic: No adenopathy. Reproductive: The prostate and seminal vesicles are grossly unremarkable Other: None Musculoskeletal: Bilateral L5 pars defects with grade 1 L5-S1 anterolisthesis. No acute fracture. Review of the MIP images confirms the above findings. IMPRESSION: 1. Partially thrombosed fusiform infrarenal aortic aneurysm measuring approximately 4.1 cm in greatest axial diameter, increased compared to the prior CT of 2015. No evidence of dissection, or aneurysmal leak. Recommend followup by ultrasound in 1 year. This recommendation follows ACR consensus guidelines: White Paper of the ACR Incidental Findings  Committee II on Vascular Findings. J Am Coll Radiol 2013; 10:789-794. 2. 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 3. No aortic dissection.  No CT evidence of pulmonary embolism. 4. Severe centrilobular emphysema. Multiple pulmonary nodules appear stable. 5. Aortic Atherosclerosis (ICD10-I70.0) and Emphysema (ICD10-J43.9). Electronically Signed   By: Elgie CollardArash  Radparvar M.D.   On: 05/23/2017 20:21   11/11/2016 cardiac cath  Conclusion     Ost LAD to Prox LAD lesion, 100 %stenosed. Short reconstitution from LIMA with Prox LAD to Mid LAD lesion, 95 %stenosed.  LIMA-mLAD and is normal in caliber and anatomically normal. Dist-apical LAD lesion, 60 %stenosed.  Prox RCA to Dist RCA lesion, 100 %stenosed.  Seq SVG- dRCA and is large and anatomically normal - fills small rPDA & RPAV with 2 RPLs  Patent non-dominant Circumflex with minimal CAD.  _________________________________________  There is mild left ventricular systolic dysfunction. The left ventricular ejection fraction is 45-50% by visual estimate.  LV end diastolic pressure is mildly elevated.  Widely patent grafts and native circumflex with known occlusion of the LAD and now occluded RCA. No obvious culprit lesion to explain the patient's angina besides the short segment of the proximal LAD isolated by proximal occlusion and distal 95% stenosis. No PCI targets.  Plan: Return to short stay for TR band removal Discharge following bed rest.  Recommendations:  Optimize medical management with antianginal therapy.  Smoking cessation counseling  F/u with Dr. Purvis Sheffield   11/01/2016 echocardiogram ------------------------------------------------------------------- LV EF: 45%  ------------------------------------------------------------------- Indications:      Chest pain  786.51.  ------------------------------------------------------------------- History:   PMH:  Tricuspid Valve Repair 26 mm [employer] M3 angioplasty ring. AAA.  Angina pectoris.  Coronary artery disease.  Chronic obstructive pulmonary disease.  PMH:   Myocardial infarction.  Risk factors:  Current tobacco use.  ------------------------------------------------------------------- Study Conclusions  - Left ventricle: The cavity size was normal. Wall thickness was   increased in a pattern of mild LVH. Systolic function was mildly   reduced. The estimated ejection fraction was 45%. - Regional wall motion abnormality: Moderate hypokinesis of the   mid-apical anterior, basal anteroseptal, basal-mid inferoseptal,   and apical septal myocardium; mild hypokinesis of the mid   anteroseptal and apical myocardium. - Aortic valve: Mildly thickened, moderately calcified leaflets.   Morphologically, there may be very mild aortic stenosis. - Mitral valve: Mildly calcified annulus. Normal thickness leaflets   . There was mild regurgitation. - Left atrium: The atrium was mildly dilated. - Right ventricle: Systolic function was mildly reduced. 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 There was trivial   regurgitation. - Pulmonary arteries: PA peak pressure: 35 mm Hg (S). - Systemic veins: Dilated IVC with normal respiratory variation.   Estimated CVP 8 mmHg.   EKG: Independently reviewed. Vent. rate 81 BPM PR interval * ms QRS duration 84 ms QT/QTc 413/480 ms P-R-T axes 80 83 63 Sinus rhythm Anteroseptal infarct, age indeterminate Not significantly changed since previous EKGs.  Assessment/Plan Principal Problem:   Chest pain   Coronary artery disease involving native heart with angina pectoris [facility]) The patient still an active smoker and noncompliant with treatment. Per last cardiology notes he is supposed to be  on amiodarone 200 mg and furosemide 20 mg daily, but currently just taking aspirin, alprazolam and Norco. He was suggested to optimize medical therapy by cardiology after cardiac cath. Continue supplemental oxygen. Continue aspirin. Trend troponin levels. Outpatient follow-up with cardiology, unless symptoms persist or cardiac biomarkers become positive.  Active Problems:   AAA (abdominal aortic aneurysm) without rupture (HCC) Continued yearly imaging surveillance.    S/P CABG (coronary artery bypass graft) Continue aspirin. Not taking as stabbing or beta blocker. I made emphasis to him and his daughters, that the best decision he could make for this and his overall health was to cease smoking as soon as possible.    Pure hypercholesterolemia Currently not taking statin.      Tobacco abuse Discussed with them briefly increased risk of MI, stroke or other cardiovascular events. Nicotine replacement therapy has been ordered. Tobacco cessation information to be provided before discharge.    COPD (chronic obstructive pulmonary disease) (HCC) Continue supplemental oxygen. Levalbuterol and ipratropium nebs every 6 hours as needed.    Anxiety Continue alprazolam 1 mg by mouth 4 times a day when necessary.    Elevated MCV/MCH Check B12 and folate levels in a.m.    DVT prophylaxis: Lovenox SQ.  Code Status: Full code. Family Communication: His daughter Otilio Miu [phone]) and Sydnee Levans [phone]) were present in his room. Disposition Plan: Admit for troponin level trending. Consults called:  Admission status: Observation/telemetry.   Bobette Mo MD [facility] Pager [phone].  If 7PM-7AM, please contact night-coverage [email or web address] Password [email or web address]  05/23/2017, 11:30 PM

## 2017-05-23 NOTE — Progress Notes (Signed)
Patient having chest pain will hold albuterol for now. Placed on oxygen

## 2017-05-23 NOTE — ED Notes (Signed)
Misty StanleyLisa- patients family  [phone]

## 2017-05-23 NOTE — ED Provider Notes (Signed)
[facility] DEPT Provider Note   CSN: [id number] Arrival date & time: 05/23/17  1649     History   Chief Complaint Chief Complaint  Patient presents with  . Chest Pain    HPI [name] is a 66 y.o. male.   Chest Pain   This is a recurrent problem. The current episode started 2 days ago. The problem occurs constantly. The problem has not changed since onset.The pain is associated with exertion. The pain is present in the substernal region. The pain is mild. Associated symptoms include abdominal pain, cough, nausea and shortness of breath. He has tried nothing for the symptoms.    Past Medical History:  Diagnosis Date  . AAA (abdominal aortic aneurysm) (HCC)   . Anxiety   . Arthritis   . COPD (chronic obstructive pulmonary disease) (HCC)   . 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 MI (myocardial infarction) (HCC)   . Pulmonary nodule    benign    Patient Active Problem List   Diagnosis Date Noted  . Angina, class II (HCC) 11/11/2016  . Abnormal nuclear stress test 11/11/2016  . Chest pain 10/09/2016  . Coronary artery disease involving native heart with angina pectoris (HCC) 10/09/2016  . Tobacco abuse 10/09/2016  . Pure hypercholesterolemia 03/21/2015  . 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 AAA (abdominal aortic aneurysm) without rupture (HCC) 03/01/2014    Past Surgical History:  Procedure Laterality Date  . CARDIAC CATHETERIZATION  01/2015   Coronary angiography on 02/17/15 demonstrated the left main coronary artery to have mild 30-40% stenosis, 100% LAD stenosis with right to left collaterals and faint left to left collaterals, small nondominant left circumflex, small to medium caliber first obtuse marginal branch with 20% ostial and proximal stenosis. The RCA was noted to have diffuse disease.  Marland Kitchen CARDIAC  CATHETERIZATION N/A 11/11/2016   Procedure: Left Heart Cath and Cors/Grafts Angiography;  Surgeon: Marykay Lex, MD;  Location: [facility] INVASIVE CV LAB;  Service: Cardiovascular;  Laterality: N/A;  . CORONARY ARTERY BYPASS GRAFT  01/2015  . INGUINAL HERNIA REPAIR Bilateral   . VALVE REPLACEMENT  01/2015   Tricuspid Valve Replacement       Home Medications    Prior to Admission medications   Medication Sig Start Date End Date Taking? Authorizing Provider  ALPRAZolam Prudy Feeler) 1 MG tablet Take 1 mg by mouth 4 (four) times daily as needed for anxiety.  09/17/16   [provider]  amiodarone (PACERONE) 200 MG tablet Take 1 tablet (200 mg total) by mouth daily. 02/04/17   Jodelle Gross, NP  aspirin EC 81 MG tablet Take 81 mg by mouth daily.     [provider]  atorvastatin (LIPITOR) 40 MG tablet Take 1 tablet (40 mg total) by mouth daily. 10/24/16   Jodelle Gross, NP  carvedilol (COREG) 3.125 MG tablet Take 3.125 mg by mouth 2 (two) times daily with a meal.  02/28/15   [provider]  clopidogrel (PLAVIX) 75 MG tablet Take 75 mg by mouth daily.  02/28/15   [provider]  furosemide (LASIX) 20 MG tablet Take 1 tablet (20 mg total) by mouth daily. 02/04/17 05/05/17  Jodelle Gross, NP  HYDROcodone-acetaminophen (NORCO) 7.5-325 MG tablet Take 1 tablet by mouth 4 (four) times daily as needed for moderate pain.  08/16/16  [provider]  nitroGLYCERIN (NITROSTAT) 0.4 MG SL tablet Place 1 tablet (0.4 mg total) under the tongue every 5 (five) minutes as needed for chest pain. 10/24/16 01/22/17  Jodelle GrossLawrence, Kathryn M, NP  potassium chloride SA (K-DUR,KLOR-CON) 20 MEQ tablet Take 1 tablet (20 mEq total) by mouth daily. 03/23/15   Laqueta LindenKoneswaran, Suresh A, MD  vitamin C (ASCORBIC ACID) 500 MG tablet Take 500 mg by mouth daily.    [provider]    Family History Family History  Problem Relation Age of Onset  . Heart disease Father     Social  History Social History  Substance Use Topics  . Smoking status: Current Every Day Smoker    Packs/day: 2.00    Years: 60.00    Types: Cigarettes    Last attempt to quit: 02/11/2015  . Smokeless tobacco: Never Used  . Alcohol use 0.6 oz/week    1 Cans of beer per week     Comment: one beer a week     Allergies   Patient has no known allergies.   Review of Systems Review of Systems  Respiratory: Positive for cough and shortness of breath.   Cardiovascular: Positive for chest pain.  Gastrointestinal: Positive for abdominal pain and nausea.  All other systems reviewed and are negative.    Physical Exam Updated Vital Signs Ht 5\' 7"  (1.702 m)   Wt 66.2 kg (146 lb)   BMI 22.87 kg/m   Physical Exam  Constitutional: He is oriented to person, place, and time. He appears well-developed and well-nourished.  HENT:  Head: Normocephalic and atraumatic.  Eyes: Conjunctivae and EOM are normal.  Neck: Normal range of motion.  Cardiovascular: Normal rate.   Pulmonary/Chest: Effort normal. No respiratory distress.  Abdominal: Soft. He exhibits no distension. There is no tenderness. There is no guarding.  Musculoskeletal: Normal range of motion.  Neurological: He is alert and oriented to person, place, and time. No cranial nerve deficit. Coordination normal.  Skin: Skin is warm and dry.  Nursing note and vitals reviewed.    ED Treatments / Results  Labs (all labs ordered are listed, but only abnormal results are displayed) Labs Reviewed  CBC - Abnormal; Notable for the following:       Result Value   RBC 4.13 (*)    MCV 102.7 (*)    MCH 35.1 (*)    All other components within normal limits  BASIC METABOLIC PANEL  I-STAT TROPONIN, ED    EKG  EKG Interpretation  Date/Time:  Friday May 23 2017 16:55:15 EDT Ventricular Rate:  79 PR Interval:    QRS Duration: 96 QT Interval:  381 QTC Calculation: 437 R Axis:   85 Text Interpretation:  Sinus rhythm Anteroseptal  infarct, age indeterminate Baseline wander in lead(s) III V3 V5 V6 poor baseline needs repeat, no charge Confirmed by Marily MemosMesner, Deloyce Walthers [phone]([id number]) on 05/23/2017 5:21:22 PM       Radiology Dg Chest 2 View  Result Date: 05/23/2017 CLINICAL DATA:  Midsternal chest pain since last night. Dizziness. Shortness of breath. Chronic cough. Smoker. EXAM: CHEST  2 VIEW COMPARISON:  10/09/2016. FINDINGS: Normal sized heart. The lungs remain hyperexpanded with mildly prominent interstitial markings. Stable median sternotomy wires and prosthetic heart valve. Stable right middle lobe calcified granuloma. Unremarkable bones. IMPRESSION: No acute abnormality.  Stable changes of COPD. Electronically Signed   By: Beckie SaltsSteven  Reid M.D.   On: 05/23/2017 17:25    Procedures Procedures (including critical care time)  Medications Ordered in  ED Medications  ipratropium-albuterol (DUONEB) 0.5-2.5 (3) MG/3ML nebulizer solution 3 mL (not administered)  nitroGLYCERIN (NITROSTAT) SL tablet 0.4 mg (not administered)  fentaNYL (SUBLIMAZE) injection 50 mcg (not administered)     Initial Impression / Assessment and Plan / ED Course  I have reviewed the triage vital signs and the nursing notes.  Pertinent labs & imaging results that were available during my care of the patient were reviewed by me and considered in my medical decision making (see chart for details).    Will eval for ACS/dissection while treating symptoms.   No dissection but does have worsening AAA. Will admit for ACS rule out.   Final Clinical Impressions(s) / ED Diagnoses   Final diagnoses:  Chest pain, unspecified type     Marily MemosMesner, Amee Boothe, MD 05/23/17 2200

## 2017-05-23 NOTE — ED Triage Notes (Signed)
Patient complaining of mid sternal chest pain since last night. States "I have an aneurysm." Also complaining of dizziness and shortness of breath.

## 2017-05-24 DIAGNOSIS — R079 Chest pain, unspecified: Secondary | ICD-10-CM | POA: Diagnosis not present

## 2017-05-24 DIAGNOSIS — F419 Anxiety disorder, unspecified: Secondary | ICD-10-CM | POA: Diagnosis not present

## 2017-05-24 DIAGNOSIS — R718 Other abnormality of red blood cells: Secondary | ICD-10-CM | POA: Diagnosis present

## 2017-05-24 LAB — FOLATE: FOLATE: 10.4 ng/mL (ref >5.9)

## 2017-05-24 LAB — TROPONIN I

## 2017-05-24 LAB — VITAMIN B12: Vitamin B-12: 223 pg/mL (ref 180–914)

## 2017-05-24 MED ORDER — COENZYME Q10 30 MG PO CAPS: 30.0000 mg | ORAL_CAPSULE | Freq: Three times a day (TID) | ORAL | 5 refills | Status: DC

## 2017-05-24 MED ORDER — PRAVASTATIN SODIUM 20 MG PO TABS: 20.0000 mg | ORAL_TABLET | Freq: Every day | ORAL | 3 refills | Status: DC

## 2017-05-24 MED ORDER — FAMOTIDINE 20 MG PO TABS: 20.0000 mg | ORAL_TABLET | Freq: Two times a day (BID) | ORAL | 2 refills | Status: DC

## 2017-05-24 MED ORDER — FISH OIL 1000 MG PO CAPS: 2.0000 | ORAL_CAPSULE | Freq: Two times a day (BID) | ORAL | 5 refills | Status: DC

## 2017-05-24 MED ORDER — NICOTINE 21 MG/24HR TD PT24: 21.0000 mg | MEDICATED_PATCH | Freq: Once | TRANSDERMAL | 0 refills | Status: AC

## 2017-05-24 NOTE — Discharge Summary (Signed)
Physician Discharge Summary  [name] ZOX:[id number] DOB: 12-14-1950 DOA: 05/23/2017  PCP: Wilson Singer, MD  Admit date: 05/23/2017 Discharge date: 05/24/2017  Admitted From: Home.  Disposition:  Home.   Recommendations for Outpatient Follow-up:  1. Follow up with PCP in 1-2 weeks 2. Cardiology in 1-2 weeks.   Home Health: none.  Equipment/Devices: None.  Discharge Condition: no chest pain. CODE STATUS: FULL CODE.  Diet recommendation: Cardiac diet.   Brief/Interim Summary: Patient was admitted by Dr Robb Matar for atypical chest pain on May 23, 2017.  As per his H and P:  " [name] is a 66 y.o. male with medical history significant of AAA, anxiety, arthritis, COPD, CAD, history of MI, CABG/tricuspid valve replacement in April 2016, hyperlipidemia not on statin, pulmonary nodule who is coming to the emergency department with complaints of chest pain since Thursday evening, as stabbing and burning-like, radiated to his left shoulder and arm, associated with nausea, dizziness and dyspnea. He states that his symptoms persisted through the evening, but subsequently he went to bed and woke up around 0400 with similar symptoms. He continued having symptoms through the day and call his daughter about 53 and subsequently they called EMS, who suggested him to take four 81 mg aspirins and use sublingual nitroglycerin. The patient states that he understood to use for sublingual nitroglycerin, which subsequently relieved his pain. He denies palpitations, diaphoresis, recent PND, orthopnea or pitting edema of the lower extremities. He denies abdominal pain, emesis, diarrhea, constipation, melena or hematochezia. He denies dysuria, frequency or hematuria. He denies polyuria, polydipsia or blurred vision. His wife states that his blood glucoses have been normal to low 100s since he started using metformin recently at the suggestion of his primary care provider.  He had an angiogram on 11/11/2016  which showed widely patent grafts and native circumflex with known occlusion of the LAD and now occluded RCA. No obvious culprit lesion to explain the patient's angina besides the short segment of the proximal LAD isolated by proximal occlusion and distal 95% stenosis. No PCI targets. (Please see full conclusions of cardiac cath below)  ED Course: Initial vital signs the patient was afebrile, pulse 77, respirations 14, blood pressure 133/79 mmHg and O2 sat 98% on room air. He received supplemental oxygen, a DuoNeb, sublingual nitroglycerin, IV fentanyl 100 g and IV hydromorphone 0.5 mg with improvement of his symptoms.  Workup shows an EKG with sinus rhythm and anterior infarct age undetermined, basically unchanged from previous. Troponin level was negative. CBC was normal, except for elevated MCV and MCH. BMP was normal.  Imaging: Chest radiograph shows stable COPD. CT angiogram chest/abdomen/pelvis did not show any acute pathological finding, but shows stable AAA and other chronic findings. Please see images and full radiology reports for further detail.  HOSPITAL COURSE:  Patient was advised to be much more compliant with his medications.  He admitted to be non compliance, and had signed out AMA previously.  Given his recent cath, and that no PCI was an option, he really should try toward risk modification.  He r/out and had no further chest pain.  He was asked to quit smoking, and was given nicotine patch.  He should resume his statin, and if he has myalgia, as he sometimes does, then he should tell his cardiologist for potential PCSK9 injection.  In the interim, I will give him Pravachol, the statin causing least myalgia, to be taken with CoQ10.  Will add 4 grams of Fish oil capsule  to his regimen.  He will continue with ASA and take Pepcid.  He is not on BB at this time, and will defer that to his follow up cardiologist.   He appeared to understand saying"  I follow you now".  He is stable for  discharge and strict criteria for returning to the ER discussed as well. Thank you for allowing me to participate in his care.  Good Day.   Discharge Diagnoses:  Principal Problem:   Chest pain Active Problems:   AAA (abdominal aortic aneurysm) without rupture (HCC)   S/P CABG (coronary artery bypass graft)   Pure hypercholesterolemia   Coronary artery disease involving native heart with angina pectoris (HCC)   Tobacco abuse   COPD (chronic obstructive pulmonary disease) (HCC)   Anxiety   Elevated MCV    Discharge Instructions  Discharge Instructions    Diet - low sodium heart healthy    Complete by:  As directed    Discharge instructions    Complete by:  As directed    Take your medications as directed.  Don't smoke.  See your heart doctor in 1-2 weeks for follow up.  See your PCP as scheduled.   Increase activity slowly    Complete by:  As directed      Allergies as of 05/24/2017   No Known Allergies     Medication List    TAKE these medications   ALPRAZolam 1 MG tablet Commonly known as:  XANAX Take 1 mg by mouth 4 (four) times daily as needed for anxiety.   aspirin EC 81 MG tablet Take 81 mg by mouth daily.   co-enzyme Q-10 30 MG capsule Take 1 capsule (30 mg total) by mouth 3 (three) times daily.   famotidine 20 MG tablet Commonly known as:  PEPCID Take 1 tablet (20 mg total) by mouth 2 (two) times daily.   Fish Oil 1000 MG Caps Take 2 capsules (2,000 mg total) by mouth 2 (two) times daily.   HYDROcodone-acetaminophen 7.5-325 MG tablet Commonly known as:  NORCO Take 1 tablet by mouth every 6 (six) hours as needed for moderate pain.   nicotine 21 mg/24hr patch Commonly known as:  NICODERM CQ - dosed in mg/24 hours Place 1 patch (21 mg total) onto the skin once.   nitroGLYCERIN 0.4 MG SL tablet Commonly known as:  NITROSTAT Place 1 tablet (0.4 mg total) under the tongue every 5 (five) minutes as needed for chest pain.   pravastatin 20 MG  tablet Commonly known as:  PRAVACHOL Take 1 tablet (20 mg total) by mouth daily.       No Known Allergies  Consultations:  None.    Procedures/Studies: Dg Chest 2 View  Result Date: 05/23/2017 CLINICAL DATA:  Midsternal chest pain since last night. Dizziness. Shortness of breath. Chronic cough. Smoker. EXAM: CHEST  2 VIEW COMPARISON:  10/09/2016. FINDINGS: Normal sized heart. The lungs remain hyperexpanded with mildly prominent interstitial markings. Stable median sternotomy wires and prosthetic heart valve. Stable right middle lobe calcified granuloma. Unremarkable bones. IMPRESSION: No acute abnormality.  Stable changes of COPD. Electronically Signed   By: Beckie SaltsSteven  Reid M.D.   On: 05/23/2017 17:25   Ct Angio Chest/abd/pel For Dissection W And/or Wo Contrast  Result Date: 05/23/2017 CLINICAL DATA:  66 year old male with history of abdominal aortic aneurysm presenting with sharp midsternal chest pain. EXAM: CT ANGIOGRAPHY CHEST, ABDOMEN AND PELVIS TECHNIQUE: Multidetector CT imaging through the chest, abdomen and pelvis was performed using the standard protocol  during bolus administration of intravenous contrast. Multiplanar reconstructed images and MIPs were obtained and reviewed to evaluate the vascular anatomy. CONTRAST:  100 cc Isovue 370 COMPARISON:  Chest radiograph dated 05/23/2017 chest CT dated 11/11/2008 FINDINGS: CTA CHEST FINDINGS Cardiovascular: There is no cardiomegaly or pericardial effusion. There is coronary vascular calcification involving the LAD. There is minimal plaque along the thoracic aorta. There is no aneurysmal dilatation or evidence of dissection. The origins of the great vessels of the aortic arch appear patent. There is no CT evidence of pulmonary embolism. Mediastinum/Nodes: Right hilar adenopathy measures 16 mm in short axis. This is of indeterminate etiology or clinical significance. No mediastinal adenopathy. The esophagus and the thyroid gland are grossly  unremarkable. Lungs/Pleura: There is advanced centrilobular emphysema. There is a 1 cm right middle lobe subpleural calcified granuloma and a 5 mm partially calcified probable granuloma (series 10, image 38), stable compared to the study of 2010. A 10 mm nodular density along the right major fissure (series 10, image 25) appears similar to the CT of 2010. There is no focal consolidation. No pleural effusion or pneumothorax. The central airways are patent. Musculoskeletal: There is no axillary adenopathy. The chest wall soft tissues are unremarkable. There is mild degenerative changes of the spine. Median sternotomy wires noted. No acute osseous pathology. Review of the MIP images confirms the above findings. CTA ABDOMEN AND PELVIS FINDINGS VASCULAR Aorta: There is a partially thrombosed fusiform infrarenal abdominal aortic aneurysm measuring up to 4.1 cm in greatest transverse diameter (previously 3.5 cm) and approximately 9.7 cm in length. There has been interval increase in the size of the intramural clot within the aneurysm. The aneurysm starts approximately at the level of the superior endplate of the L3 with inferior extension into the right common iliac artery. There is no inflammatory changes or stranding surrounding the aorta. No extravasation of contrast outside of the lumen of the aorta. There is no dissection. Celiac: Patent without evidence of aneurysm, dissection, vasculitis or significant stenosis. SMA: Patent without evidence of aneurysm, dissection, vasculitis or significant stenosis. There is a replaced right hepatic artery anatomy from the SMA. Renals: Both renal arteries are patent without evidence of aneurysm, dissection, vasculitis, fibromuscular dysplasia or significant stenosis. IMA: The origin of the IMA is not visualized. Of the IMA however appears patent. Inflow: There is a partially thrombosed aneurysm of the right common iliac artery measuring up to 2.2 cm in greatest diameter. There is  advanced atherosclerotic calcification of the common, internal, and external iliac arteries bilaterally. These vessels however remain patent. No dissection. Veins: No obvious venous abnormality within the limitations of this arterial phase study. Review of the MIP images confirms the above findings. NON-VASCULAR No intra-abdominal free air or free fluid. Hepatobiliary: No focal liver abnormality is seen. No gallstones, gallbladder wall thickening, or biliary dilatation. Pancreas: Unremarkable. No pancreatic ductal dilatation or surrounding inflammatory changes. Spleen: Normal in size without focal abnormality. Adrenals/Urinary Tract: Adrenal glands are unremarkable. Kidneys are normal, without renal calculi, focal lesion, or hydronephrosis. Bladder is unremarkable. Stomach/Bowel: There is moderate stool throughout the colon. No evidence of bowel obstruction or active inflammation. Normal appendix. Lymphatic: No adenopathy. Reproductive: The prostate and seminal vesicles are grossly unremarkable Other: None Musculoskeletal: Bilateral L5 pars defects with grade 1 L5-S1 anterolisthesis. No acute fracture. Review of the MIP images confirms the above findings. IMPRESSION: 1. Partially thrombosed fusiform infrarenal aortic aneurysm measuring approximately 4.1 cm in greatest axial diameter, increased compared to the prior CT of 2015. No  evidence of dissection, or aneurysmal leak. Recommend followup by ultrasound in 1 year. This recommendation follows ACR consensus guidelines: White Paper of the ACR Incidental Findings Committee II on Vascular Findings. J Am Coll Radiol 2013; 10:789-794. 2. 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 3. No aortic dissection.  No CT evidence of pulmonary embolism. 4. Severe centrilobular emphysema. Multiple pulmonary nodules appear stable. 5. Aortic Atherosclerosis (ICD10-I70.0) and Emphysema (ICD10-J43.9). Electronically  Signed   By: Elgie CollardArash  Radparvar M.D.   On: 05/23/2017 20:21       Subjective: " I want to go home now.  I am dressed."   Discharge Exam: Vitals:   05/24/17 0506 05/24/17 0906  BP: (!) 93/52 (!) 92/55  Pulse: 73 72  Resp: 18 19  Temp: 97.8 F (36.6 C) 98 F (36.7 C)   Vitals:   05/23/17 2246 05/24/17 0240 05/24/17 0506 05/24/17 0906  BP: (!) 99/54 (!) 89/52 (!) 93/52 (!) 92/55  Pulse: 83 72 73 72  Resp: 20 18 18 19   Temp: 98.1 F (36.7 C) 98.6 F (37 C) 97.8 F (36.6 C) 98 F (36.7 C)  TempSrc: Oral Oral Oral Oral  SpO2: 96% 93% 100% 100%  Weight: 59.6 kg (131 lb 4.8 oz)     Height: 5\' 7"  (1.702 m)       General: Pt is alert, awake, not in acute distress Cardiovascular: RRR, S1/S2 +, no rubs, no gallops Respiratory: CTA bilaterally, no wheezing, no rhonchi Abdominal: Soft, NT, ND, bowel sounds + Extremities: no edema, no cyanosis    The results of significant diagnostics from this hospitalization (including imaging, microbiology, ancillary and laboratory) are listed below for reference.    Basic Metabolic Panel:  Recent Labs Lab 05/23/17 1701 05/23/17 1702  NA 138  --   K 3.8  --   CL 104  --   CO2 25  --   GLUCOSE 87  --   BUN 13  --   CREATININE 0.95  --   CALCIUM 9.5  --   MG  --  2.2   CBC:  Recent Labs Lab 05/23/17 1701  WBC 8.2  HGB 14.5  HCT 42.4  MCV 102.7*  PLT 187   Cardiac Enzymes:  Recent Labs Lab 05/23/17 2359 05/24/17 0549  TROPONINI <0.03 <0.03   Urinalysis    Component Value Date/Time   COLORURINE YELLOW 01/23/2014 1530   APPEARANCEUR CLEAR 01/23/2014 1530   LABSPEC 1.010 01/23/2014 1530   PHURINE 6.0 01/23/2014 1530   GLUCOSEU NEGATIVE 01/23/2014 1530   HGBUR NEGATIVE 01/23/2014 1530   BILIRUBINUR NEGATIVE 01/23/2014 1530   KETONESUR NEGATIVE 01/23/2014 1530   PROTEINUR NEGATIVE 01/23/2014 1530   UROBILINOGEN 0.2 01/23/2014 1530   NITRITE NEGATIVE 01/23/2014 1530   LEUKOCYTESUR NEGATIVE 01/23/2014 1530     Time coordinating discharge: Over 30 minutes SIGNED:  Houston SirenLE,Inette Doubrava, MD FACP [facility] 05/24/2017, 11:48 AM   If 7PM-7AM, please contact night-coverage [email or web address] Password [email or web address]

## 2017-06-09 DIAGNOSIS — G894 Chronic pain syndrome: Secondary | ICD-10-CM | POA: Diagnosis not present

## 2017-06-09 DIAGNOSIS — Z7689 Persons encountering health services in other specified circumstances: Secondary | ICD-10-CM | POA: Diagnosis not present

## 2017-06-09 DIAGNOSIS — F419 Anxiety disorder, unspecified: Secondary | ICD-10-CM | POA: Diagnosis not present

## 2017-06-09 DIAGNOSIS — Z6821 Body mass index (BMI) 21.0-21.9, adult: Secondary | ICD-10-CM | POA: Diagnosis not present

## 2017-07-15 ENCOUNTER — Emergency Department (HOSPITAL_COMMUNITY): Payer: Medicare Other

## 2017-07-15 ENCOUNTER — Emergency Department (HOSPITAL_COMMUNITY)
Admission: EM | Admit: 2017-07-15 | Discharge: 2017-07-15 | Disposition: A | Discharge status: Home / Self Care | Payer: Medicare Other | Attending: Emergency Medicine | Admitting: Emergency Medicine

## 2017-07-15 ENCOUNTER — Encounter (HOSPITAL_COMMUNITY): Payer: Self-pay | Admitting: *Deleted

## 2017-07-15 DIAGNOSIS — Y929 Unspecified place or not applicable: Secondary | ICD-10-CM | POA: Diagnosis not present

## 2017-07-15 DIAGNOSIS — Z7982 Long term (current) use of aspirin: Secondary | ICD-10-CM | POA: Diagnosis not present

## 2017-07-15 DIAGNOSIS — S199XXA Unspecified injury of neck, initial encounter: Secondary | ICD-10-CM | POA: Diagnosis not present

## 2017-07-15 DIAGNOSIS — I25119 Atherosclerotic heart disease of native coronary artery with unspecified angina pectoris: Secondary | ICD-10-CM | POA: Insufficient documentation

## 2017-07-15 DIAGNOSIS — F17210 Nicotine dependence, cigarettes, uncomplicated: Secondary | ICD-10-CM | POA: Diagnosis not present

## 2017-07-15 DIAGNOSIS — S161XXA Strain of muscle, fascia and tendon at neck level, initial encounter: Secondary | ICD-10-CM | POA: Diagnosis not present

## 2017-07-15 DIAGNOSIS — S0083XA Contusion of other part of head, initial encounter: Secondary | ICD-10-CM | POA: Insufficient documentation

## 2017-07-15 DIAGNOSIS — Y999 Unspecified external cause status: Secondary | ICD-10-CM | POA: Insufficient documentation

## 2017-07-15 DIAGNOSIS — W182XXA Fall in (into) shower or empty bathtub, initial encounter: Secondary | ICD-10-CM | POA: Insufficient documentation

## 2017-07-15 DIAGNOSIS — Y9389 Activity, other specified: Secondary | ICD-10-CM | POA: Insufficient documentation

## 2017-07-15 DIAGNOSIS — S0993XA Unspecified injury of face, initial encounter: Secondary | ICD-10-CM | POA: Diagnosis not present

## 2017-07-15 DIAGNOSIS — Z79899 Other long term (current) drug therapy: Secondary | ICD-10-CM | POA: Insufficient documentation

## 2017-07-15 LAB — CBG MONITORING, ED: Glucose-Capillary: 96 mg/dL (ref 65–99)

## 2017-07-15 IMAGING — CT CT CERVICAL SPINE W/O CM
5 of 7 series · 13 of 33 positions shown, 14 images · non-contrast
Comparison: None.

CLINICAL DATA: Right jaw pain after fall in shower.

EXAM:
CT MAXILLOFACIAL WITHOUT CONTRAST
CT CERVICAL SPINE WITHOUT CONTRAST
TECHNIQUE: Multidetector CT imaging of the maxillofacial structures was
performed. Multiplanar CT image reconstructions were also generated.
A small metallic BB was placed on the right temple in order to
reliably differentiate right from left.
Multidetector CT imaging of the cervical spine was performed without
intravenous contrast. Multiplanar CT image reconstructions were also
generated.

[Series 3: max soft · axial · 0.34mm/px · z∈[-12,+50]mm · 2 of 95 slices shown]
[im 32/95  soft-tissue]
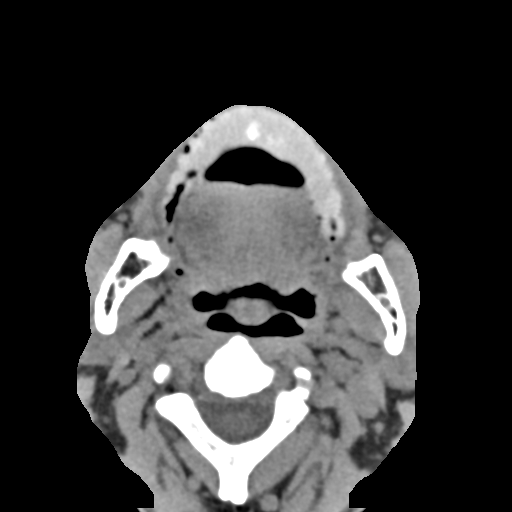
[im 63/95  soft-tissue]
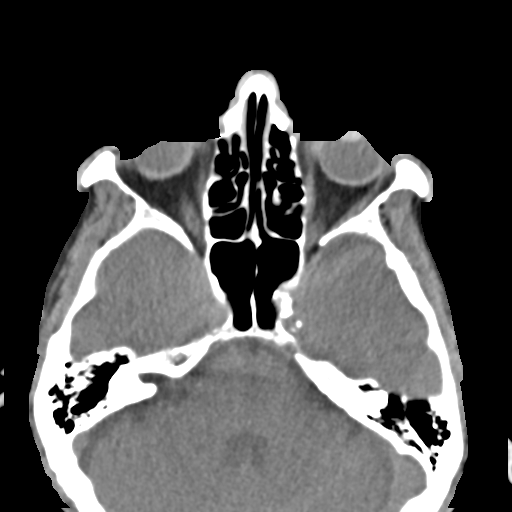

[Series 12: c spine soft · axial · 0.31mm/px · z∈[-94,-0]mm · 3 of 95 slices shown]
[im 24/95  soft-tissue]
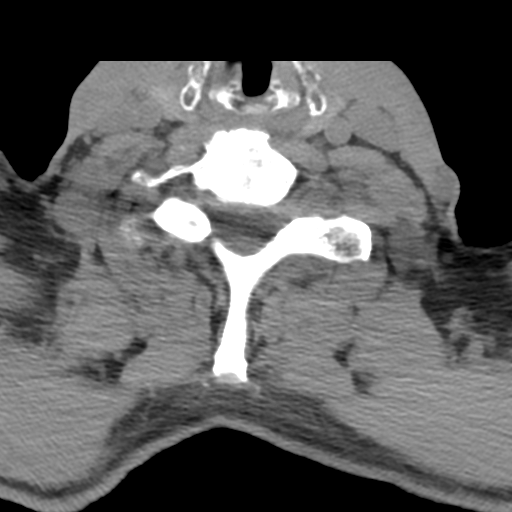
[im 48/95  soft-tissue]
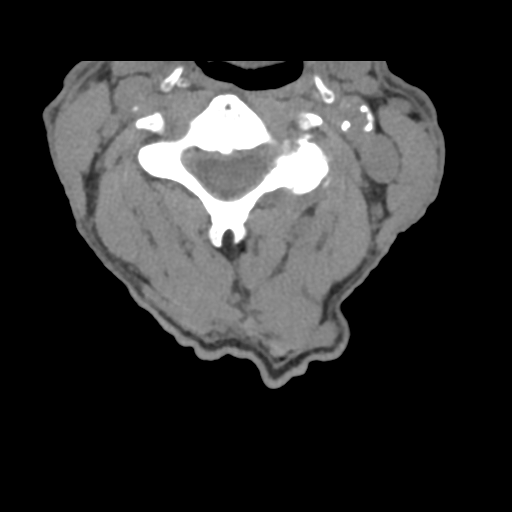
[im 71/95  soft-tissue]
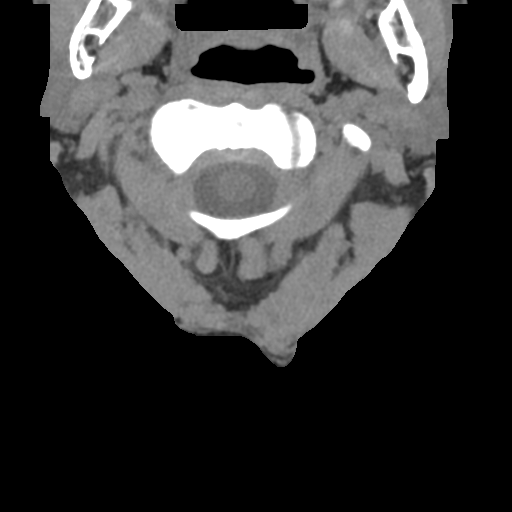

[Series 13: sagittal bone · sagittal · 0.28mm/px · 4 of 61 slices shown]
[im 13/61  bone]
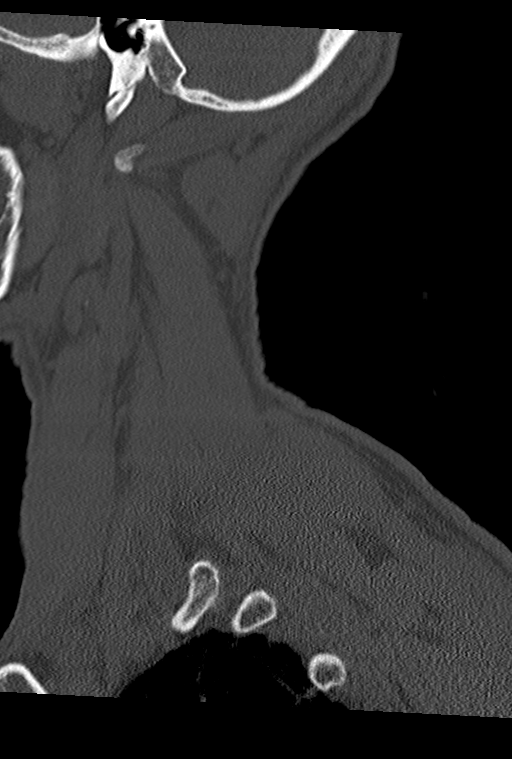
[im 25/61  bone]
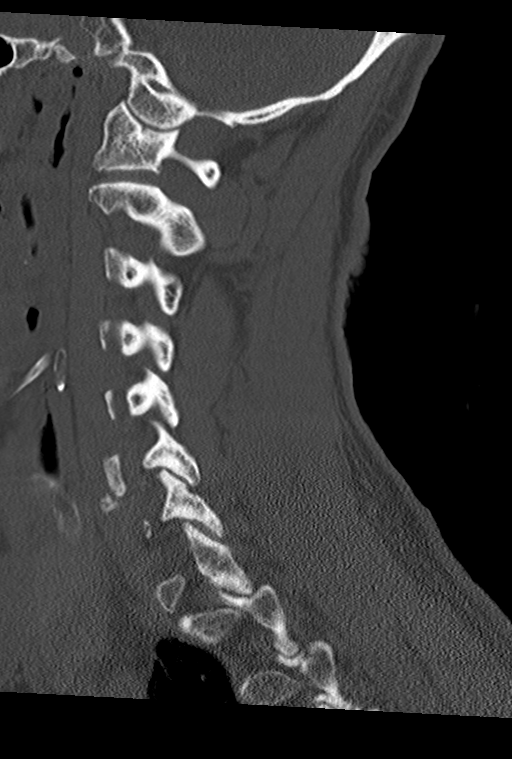
[im 37/61  bone]
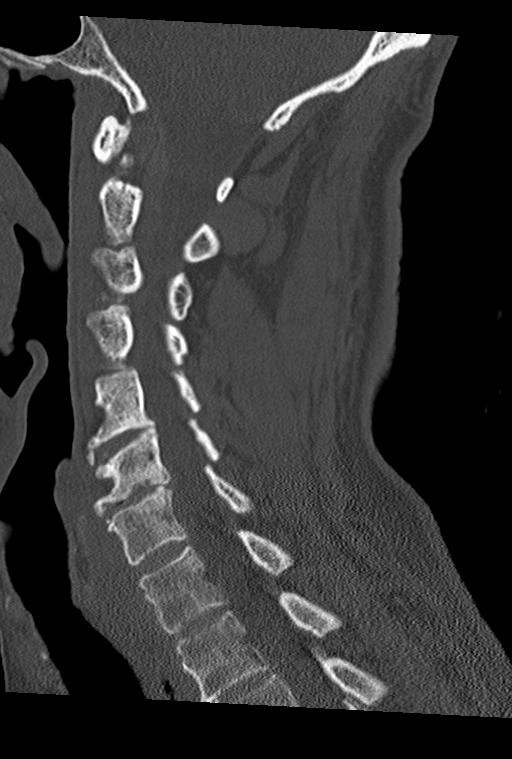
[im 49/61  bone]
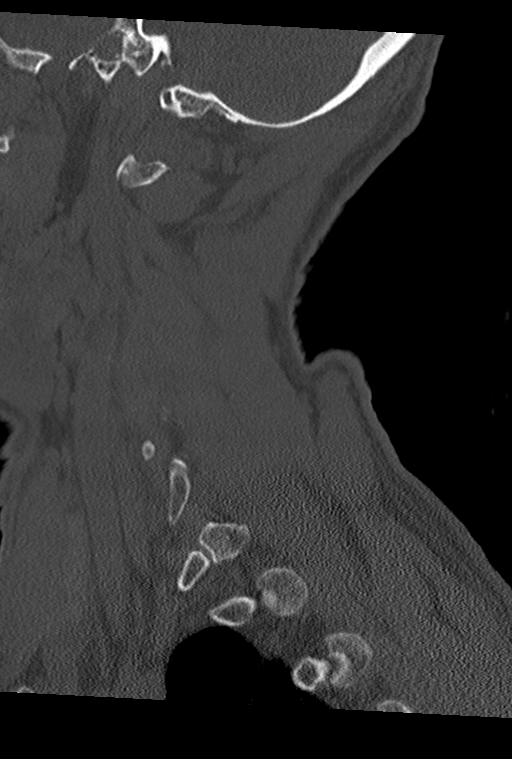

[Series 14: coronal bone · coronal · 0.28mm/px · 1 of 70 slices shown]
[im 35/70  bone]
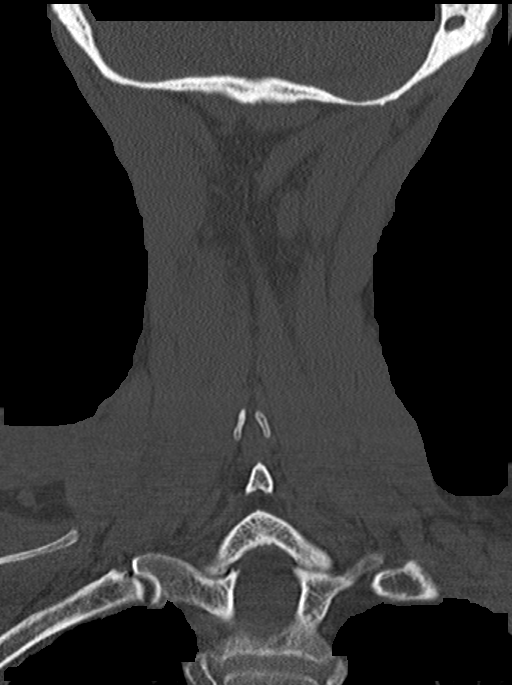

[Series 16: orthogonal axials · axial · 0.21mm/px · z∈[-116,-16]mm · 3 of 94 slices shown, 4 images]
[im 24/94  soft-tissue]
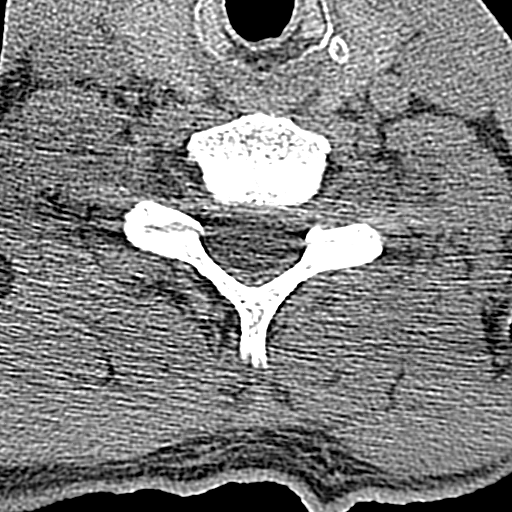
[im 24/94  bone]
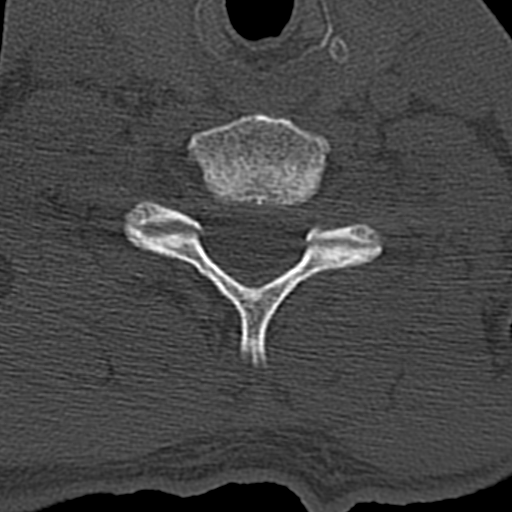
[im 47/94  bone]
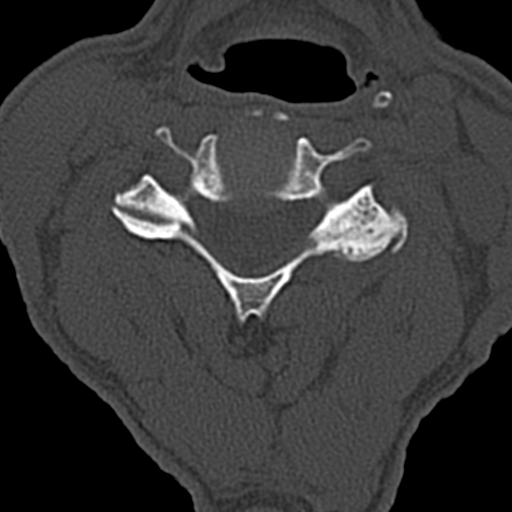
[im 70/94  bone]
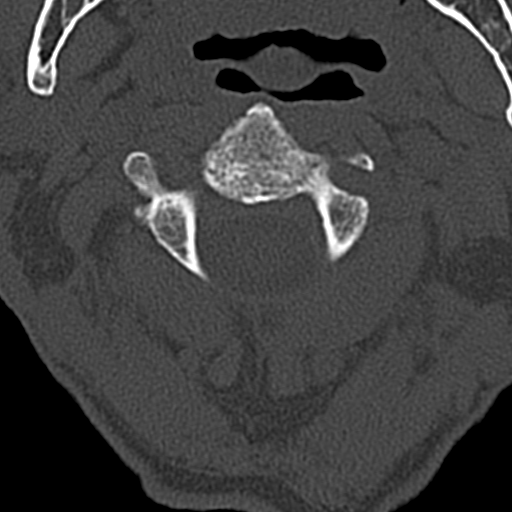

[13 of 33 positions shown; findings below may reference images not displayed]

FINDINGS: CT MAXILLOFACIAL FINDINGS

Osseous: No fracture or mandibular dislocation. No destructive
process.

Orbits: Negative. No traumatic or inflammatory finding.

Sinuses: There is mild mucosal thickening involving the frontal
sinus. No fluid levels identified.

Soft tissues: Negative.

Limited intracranial: No significant or unexpected finding.

CT CERVICAL FINDINGS

Alignment: Normal.

Skull base and vertebrae: No acute fracture. No primary bone lesion
or focal pathologic process.

Soft tissues and spinal canal: No prevertebral fluid or swelling. No
visible canal hematoma.

Disc levels: Mild multi level disc space narrowing and ventral
spurring noted. Most advanced at C6-7.

Upper chest: Negative.

Other: No acute findings. Changes of centrilobular emphysema noted
within the lung apices.
IMPRESSION: 1. No evidence for facial bone fracture.
2. No evidence for cervical spine fracture
3. Multilevel cervical degenerative disc disease
4. Emphysema

## 2017-07-15 MED ORDER — TRAMADOL HCL 50 MG PO TABS: 50.0000 mg | ORAL_TABLET | Freq: Four times a day (QID) | ORAL | 0 refills | Status: DC | PRN

## 2017-07-15 NOTE — ED Provider Notes (Signed)
[facility]EM[facility]Y DEPT Provider Note   CSN: [id number] Arrival date & time: 07/15/17  1229     History   Chief Complaint Chief Complaint  Patient presents with  . Jaw Pain    HPI [name] is a 66 y.o. male.  Patient is a 66 year old male who presents to the emergency department following a fall.  The patient states that he tripped in the shower, hit his chin and jaw on the tub. He denies loss of consciousness, but has complaint of  jaw pain, and neck soreness. Patient denies any other injury. He denies any vision changes, difficulty breathing, or abdominal pain or pelvis pain. Patient has pain with opening and closing his mouth. No trauma to the teeth or tongue.   The history is provided by the patient.    Past Medical History:  Diagnosis Date  . AAA (abdominal aortic aneurysm) (HCC)   . Anxiety   . Arthritis   . COPD (chronic obstructive pulmonary disease) (HCC)   . 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 MI (myocardial infarction) (HCC)   . Pulmonary nodule    benign    Patient Active Problem List   Diagnosis Date Noted  . Elevated MCV 05/24/2017  . COPD (chronic obstructive pulmonary disease) (HCC) 05/23/2017  . Anxiety 05/23/2017  . Angina, class II (HCC) 11/11/2016  . Abnormal nuclear stress test 11/11/2016  . Chest pain 10/09/2016  . Coronary artery disease involving native heart with angina pectoris (HCC) 10/09/2016  . Tobacco abuse 10/09/2016  . Pure hypercholesterolemia 03/21/2015  . 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 AAA (abdominal aortic aneurysm) without rupture (HCC) 03/01/2014    Past Surgical History:  Procedure Laterality Date  . CARDIAC CATHETERIZATION  01/2015   Coronary angiography on 02/17/15 demonstrated the left main coronary artery to have mild 30-40% stenosis, 100% LAD stenosis with  right to left collaterals and faint left to left collaterals, small nondominant left circumflex, small to medium caliber first obtuse marginal branch with 20% ostial and proximal stenosis. The RCA was noted to have diffuse disease.  Marland Kitchen CARDIAC CATHETERIZATION N/A 11/11/2016   Procedure: Left Heart Cath and Cors/Grafts Angiography;  Surgeon: Marykay Lex, MD;  Location: [facility] INVASIVE CV LAB;  Service: Cardiovascular;  Laterality: N/A;  . CORONARY ARTERY BYPASS GRAFT  01/2015  . INGUINAL HERNIA REPAIR Bilateral   . VALVE REPLACEMENT  01/2015   Tricuspid Valve Replacement       Home Medications    Prior to Admission medications   Medication Sig Start Date End Date Taking? Authorizing Provider  ALPRAZolam Prudy Feeler) 1 MG tablet Take 1 mg by mouth 4 (four) times daily as needed for anxiety.  09/17/16   [provider]  aspirin EC 81 MG tablet Take 81 mg by mouth daily.     [provider]  co-enzyme Q-10 30 MG capsule Take 1 capsule (30 mg total) by mouth 3 (three) times daily. 05/24/17   Houston Siren, MD  famotidine (PEPCID) 20 MG tablet Take 1 tablet (20 mg total) by mouth 2 (two) times daily. 05/24/17   Houston Siren, MD  HYDROcodone-acetaminophen (NORCO) 7.5-325 MG tablet Take 1 tablet by mouth every 6 (six) hours as needed for moderate pain.  08/16/16   [provider]  nitroGLYCERIN (NITROSTAT) 0.4 MG SL tablet Place 1 tablet (0.4 mg total)  under the tongue every 5 (five) minutes as needed for chest pain. 10/24/16 05/23/17  Jodelle Gross, NP  Omega-3 Fatty Acids (FISH OIL) 1000 MG CAPS Take 2 capsules (2,000 mg total) by mouth 2 (two) times daily. 05/24/17   Houston Siren, MD  pravastatin (PRAVACHOL) 20 MG tablet Take 1 tablet (20 mg total) by mouth daily. 05/24/17   Houston Siren, MD    Family History Family History  Problem Relation Age of Onset  . Heart disease Father   . Heart attack Sister        Died in her 35s  . CAD Brother   . Heart attack Brother   . CAD Brother   .  Heart attack Brother     Social History Social History  Substance Use Topics  . Smoking status: Current Every Day Smoker    Packs/day: 2.00    Years: 60.00    Types: Cigarettes    Last attempt to quit: 02/11/2015  . Smokeless tobacco: Never Used  . Alcohol use 0.6 oz/week    1 Cans of beer per week     Comment: one beer a week     Allergies   Patient has no known allergies.   Review of Systems Review of Systems  Constitutional: Negative for activity change.       All ROS Neg except as noted in HPI  HENT: Negative for nosebleeds.        Jaw pain  Eyes: Negative for photophobia and discharge.  Respiratory: Negative for cough, shortness of breath and wheezing.   Cardiovascular: Negative for chest pain and palpitations.  Gastrointestinal: Negative for abdominal pain and blood in stool.  Genitourinary: Negative for dysuria, frequency and hematuria.  Musculoskeletal: Positive for neck pain and neck stiffness. Negative for arthralgias and back pain.  Skin: Negative.   Neurological: Negative for dizziness, seizures and speech difficulty.  Psychiatric/Behavioral: Negative for confusion and hallucinations.     Physical Exam Updated Vital Signs BP 116/67   Pulse 67   Temp 98.7 F (37.1 C) (Oral)   Resp 16   Ht  (1.702 m)   Wt 59.4 kg (131 lb)   SpO2 96%   BMI 20.52 kg/m   Physical Exam  Constitutional: He is oriented to person, place, and time. He appears well-developed and well-nourished.  Non-toxic appearance.  HENT:  Head: Normocephalic.    Right Ear: Tympanic membrane and external ear normal.  Left Ear: Tympanic membrane and external ear normal.  Negative battles sign  Eyes: Pupils are equal, round, and reactive to light. EOM and lids are normal.  Neck: Normal range of motion. Neck supple. Carotid bruit is not present.  Cardiovascular: Normal rate, regular rhythm, normal heart sounds, intact distal pulses and normal pulses.   Pulmonary/Chest: Breath  sounds normal. No respiratory distress.  Abdominal: Soft. Bowel sounds are normal. There is no tenderness. There is no guarding.  Musculoskeletal: Normal range of motion.  Lymphadenopathy:       Head (right side): No submandibular adenopathy present.       Head (left side): No submandibular adenopathy present.    He has no cervical adenopathy.  Neurological: He is alert and oriented to person, place, and time. He has normal strength. No cranial nerve deficit or sensory deficit.  Skin: Skin is warm and dry.  Psychiatric: He has a normal mood and affect. His speech is normal.  Nursing note and vitals reviewed.    ED Treatments / Results  Labs (all  labs ordered are listed, but only abnormal results are displayed) Labs Reviewed  CBG MONITORING, ED    EKG  EKG Interpretation None       Radiology Ct Cervical Spine Wo Contrast  Result Date: 07/15/2017 CLINICAL DATA:  Right jaw pain after fall in shower. EXAM: CT MAXILLOFACIAL WITHOUT CONTRAST CT CERVICAL SPINE WITHOUT CONTRAST TECHNIQUE: Multidetector CT imaging of the maxillofacial structures was performed. Multiplanar CT image reconstructions were also generated. A small metallic BB was placed on the right temple in order to reliably differentiate right from left. Multidetector CT imaging of the cervical spine was performed without intravenous contrast. Multiplanar CT image reconstructions were also generated. COMPARISON:  None. FINDINGS: CT MAXILLOFACIAL FINDINGS Osseous: No fracture or mandibular dislocation. No destructive process. Orbits: Negative. No traumatic or inflammatory finding. Sinuses: There is mild mucosal thickening involving the frontal sinus. No fluid levels identified. Soft tissues: Negative. Limited intracranial: No significant or unexpected finding. CT CERVICAL FINDINGS Alignment: Normal. Skull base and vertebrae: No acute fracture. No primary bone lesion or focal pathologic process. Soft tissues and spinal canal: No  prevertebral fluid or swelling. No visible canal hematoma. Disc levels: Mild multi level disc space narrowing and ventral spurring noted. Most advanced at C6-7. Upper chest: Negative. Other: No acute findings. Changes of centrilobular emphysema noted within the lung apices. IMPRESSION: 1. No evidence for facial bone fracture. 2. No evidence for cervical spine fracture 3. Multilevel cervical degenerative disc disease 4. Emphysema Electronically Signed   By: Signa Kell M.D.   On: 07/15/2017 13:40   Ct Maxillofacial Wo Contrast  Result Date: 07/15/2017 CLINICAL DATA:  Right jaw pain after fall in shower. EXAM: CT MAXILLOFACIAL WITHOUT CONTRAST CT CERVICAL SPINE WITHOUT CONTRAST TECHNIQUE: Multidetector CT imaging of the maxillofacial structures was performed. Multiplanar CT image reconstructions were also generated. A small metallic BB was placed on the right temple in order to reliably differentiate right from left. Multidetector CT imaging of the cervical spine was performed without intravenous contrast. Multiplanar CT image reconstructions were also generated. COMPARISON:  None. FINDINGS: CT MAXILLOFACIAL FINDINGS Osseous: No fracture or mandibular dislocation. No destructive process. Orbits: Negative. No traumatic or inflammatory finding. Sinuses: There is mild mucosal thickening involving the frontal sinus. No fluid levels identified. Soft tissues: Negative. Limited intracranial: No significant or unexpected finding. CT CERVICAL FINDINGS Alignment: Normal. Skull base and vertebrae: No acute fracture. No primary bone lesion or focal pathologic process. Soft tissues and spinal canal: No prevertebral fluid or swelling. No visible canal hematoma. Disc levels: Mild multi level disc space narrowing and ventral spurring noted. Most advanced at C6-7. Upper chest: Negative. Other: No acute findings. Changes of centrilobular emphysema noted within the lung apices. IMPRESSION: 1. No evidence for facial bone fracture.  2. No evidence for cervical spine fracture 3. Multilevel cervical degenerative disc disease 4. Emphysema Electronically Signed   By: Signa Kell M.D.   On: 07/15/2017 13:40    Procedures Procedures (including critical care time)  Medications Ordered in ED Medications - No data to display   Initial Impression / Assessment and Plan / ED Course  I have reviewed the triage vital signs and the nursing notes.  Pertinent labs & imaging results that were available during my care of the patient were reviewed by me and considered in my medical decision making (see chart for details).     Pt seen with me by Dr Adriana Simas.  Final Clinical Impressions(s) / ED Diagnoses MDM CT maxillofacial scan shows no evidence of facial  bone fracture. CT scan of the cervical spine is negative for fracture. There is noted multiple level cervical degenerative disc disease.  Patient is ambulatory. He moves upper and lower extremities without problem. He has pain with opening his mouth, but can open it. He has good mobilization of secretions.  I've asked patient to use an ice pack to the bruised area. He will also use Tylenol every 4 hours, or ibuprofen every 6 hours. He will use Ultram for more severe pain. Patient is to return to the emergency department if any changes, problems, or concerns.   Final diagnoses:  Contusion of jaw, initial encounter  Strain of neck muscle, initial encounter    New Prescriptions Discharge Medication List as of 07/15/2017  2:11 PM    START taking these medications   Details  traMADol (ULTRAM) 50 MG tablet Take 1 tablet (50 mg total) by mouth every 6 (six) hours as needed., Starting Tue 07/15/2017, Print         Beverely Pace, Kaplan, PA-C 07/15/17 2015    Donnetta Hutching, MD 07/16/17 1740

## 2017-07-15 NOTE — Discharge Instructions (Signed)
Vital signs stable. Your xrays are negative for acute problem. Please apply ice pack. Use tylenol for mild pain. Use ultram for more severe pain. This medication may cause drowsiness. Please do not drink, drive, or participate in activity that requires concentration while taking this medication.

## 2017-07-15 NOTE — ED Triage Notes (Signed)
Pt c/o right jaw pain after tripping and falling in the shower today. Denies LOC upon fall.

## 2017-10-15 DIAGNOSIS — F419 Anxiety disorder, unspecified: Secondary | ICD-10-CM | POA: Diagnosis not present

## 2017-10-15 DIAGNOSIS — Z7689 Persons encountering health services in other specified circumstances: Secondary | ICD-10-CM | POA: Diagnosis not present

## 2017-10-15 DIAGNOSIS — Z6821 Body mass index (BMI) 21.0-21.9, adult: Secondary | ICD-10-CM | POA: Diagnosis not present

## 2017-10-15 DIAGNOSIS — G894 Chronic pain syndrome: Secondary | ICD-10-CM | POA: Diagnosis not present

## 2017-10-15 DIAGNOSIS — Z1389 Encounter for screening for other disorder: Secondary | ICD-10-CM | POA: Diagnosis not present

## 2017-10-28 ENCOUNTER — Ambulatory Visit: Payer: Medicare Other | Admitting: Family

## 2017-10-28 ENCOUNTER — Other Ambulatory Visit (HOSPITAL_COMMUNITY): Payer: Self-pay

## 2017-11-28 DIAGNOSIS — Z6822 Body mass index (BMI) 22.0-22.9, adult: Secondary | ICD-10-CM | POA: Diagnosis not present

## 2017-11-28 DIAGNOSIS — G894 Chronic pain syndrome: Secondary | ICD-10-CM | POA: Diagnosis not present

## 2017-12-26 ENCOUNTER — Ambulatory Visit (INDEPENDENT_AMBULATORY_CARE_PROVIDER_SITE_OTHER): Payer: Medicare Other | Admitting: Family

## 2017-12-26 ENCOUNTER — Encounter: Payer: Self-pay | Admitting: Family

## 2017-12-26 ENCOUNTER — Ambulatory Visit (HOSPITAL_COMMUNITY)
Admission: RE | Admit: 2017-12-26 | Discharge: 2017-12-26 | Disposition: A | Discharge status: Home / Self Care | Payer: Medicare Other | Source: Ambulatory Visit | Attending: Family | Admitting: Family

## 2017-12-26 ENCOUNTER — Other Ambulatory Visit: Payer: Self-pay

## 2017-12-26 VITALS — BP 117/69 | HR 59 | Resp 20 | Ht 67.0 in | Wt 141.0 lb

## 2017-12-26 DIAGNOSIS — I714 Abdominal aortic aneurysm, without rupture, unspecified: Secondary | ICD-10-CM

## 2017-12-26 DIAGNOSIS — R0989 Other specified symptoms and signs involving the circulatory and respiratory systems: Secondary | ICD-10-CM

## 2017-12-26 DIAGNOSIS — F17200 Nicotine dependence, unspecified, uncomplicated: Secondary | ICD-10-CM | POA: Diagnosis not present

## 2017-12-26 NOTE — Patient Instructions (Signed)
Before your next abdominal ultrasound:  Take two Extra-Strength Gas-X capsules at bedtime the night before the test. Take another two Extra-Strength Gas-X capsules 3 hours before the test.  Avoid gas forming foods the day before the test.      Steps to Quit Smoking Smoking tobacco can be bad for your health. It can also affect almost every organ in your body. Smoking puts you and people around you at risk for many serious long-lasting (chronic) diseases. Quitting smoking is hard, but it is one of the best things that you can do for your health. It is never too late to quit. What are the benefits of quitting smoking? When you quit smoking, you lower your risk for getting serious diseases and conditions. They can include:  Lung cancer or lung disease.  Heart disease.  Stroke.  Heart attack.  Not being able to have children (infertility).  Weak bones (osteoporosis) and broken bones (fractures).  If you have coughing, wheezing, and shortness of breath, those symptoms may get better when you quit. You may also get sick less often. If you are pregnant, quitting smoking can help to lower your chances of having a baby of low birth weight. What can I do to help me quit smoking? Talk with your doctor about what can help you quit smoking. Some things you can do (strategies) include:  Quitting smoking totally, instead of slowly cutting back how much you smoke over a period of time.  Going to in-person counseling. You are more likely to quit if you go to many counseling sessions.  Using resources and support systems, such as: ? Online chats with a counselor. ? Phone quitlines. ? Printed self-help materials. ? Support groups or group counseling. ? Text messaging programs. ? Mobile phone apps or applications.  Taking medicines. Some of these medicines may have nicotine in them. If you are pregnant or breastfeeding, do not take any medicines to quit smoking unless your doctor says it is  okay. Talk with your doctor about counseling or other things that can help you.  Talk with your doctor about using more than one strategy at the same time, such as taking medicines while you are also going to in-person counseling. This can help make quitting easier. What things can I do to make it easier to quit? Quitting smoking might feel very hard at first, but there is a lot that you can do to make it easier. Take these steps:  Talk to your family and friends. Ask them to support and encourage you.  Call phone quitlines, reach out to support groups, or work with a counselor.  Ask people who smoke to not smoke around you.  Avoid places that make you want (trigger) to smoke, such as: ? Bars. ? Parties. ? Smoke-break areas at work.  Spend time with people who do not smoke.  Lower the stress in your life. Stress can make you want to smoke. Try these things to help your stress: ? Getting regular exercise. ? Deep-breathing exercises. ? Yoga. ? Meditating. ? Doing a body scan. To do this, close your eyes, focus on one area of your body at a time from head to toe, and notice which parts of your body are tense. Try to relax the muscles in those areas.  Download or buy apps on your mobile phone or tablet that can help you stick to your quit plan. There are many free apps, such as QuitGuide from the CDC (Centers for Disease Control and Prevention).   You can find more support from smokefree.gov and other websites.  This information is not intended to replace advice given to you by your health care provider. Make sure you discuss any questions you have with your health care provider. Document Released: 08/03/2009 Document Revised: 06/04/2016 Document Reviewed: 02/21/2015 Elsevier Interactive Patient Education  2018 [employer]    Abdominal Aortic Aneurysm Blood pumps away from the heart through tubes (blood vessels) called arteries. Aneurysms are weak or damaged places in the wall of an  artery. It bulges out like a balloon. An abdominal aortic aneurysm happens in the main artery of the body (aorta). It can burst or tear, causing bleeding inside the body. This is an emergency. It needs treatment right away. What are the causes? The exact cause is unknown. Things that could cause this problem include:  Fat and other substances building up in the lining of a tube.  Swelling of the walls of a blood vessel.  Certain tissue diseases.  Belly (abdominal) trauma.  An infection in the main artery of the body.  What increases the risk? There are things that make it more likely for you to have an aneurysm. These include:  Being over the age of 69 years old.  Having high blood pressure (hypertension).  Being a male.  Being white.  Being very overweight (obese).  Having a family history of aneurysm.  Using tobacco products.  What are the signs or symptoms? Symptoms depend on the size of the aneurysm and how fast it grows. There may not be symptoms. If symptoms occur, they can include:  Pain (belly, side, lower back, or groin).  Feeling full after eating a small amount of food.  Feeling sick to your stomach (nauseous), throwing up (vomiting), or both.  Feeling a lump in your belly that feels like it is beating (pulsating).  Feeling like you will pass out (faint).  How is this treated?  Medicine to control blood pressure and pain.  Imaging tests to see if the aneurysm gets bigger.  Surgery. How is this prevented? To lessen your chance of getting this condition:  Stop smoking. Stop chewing tobacco.  Limit or avoid alcohol.  Keep your blood pressure, blood sugar, and cholesterol within normal limits.  Eat less salt.  Eat foods low in saturated fats and cholesterol. These are found in animal and whole dairy products.  Eat more fiber. Fiber is found in whole grains, vegetables, and fruits.  Keep a healthy weight.  Stay active and exercise  often.  This information is not intended to replace advice given to you by your health care provider. Make sure you discuss any questions you have with your health care provider. Document Released: 02/01/2013 Document Revised: 03/14/2016 Document Reviewed: 11/06/2012 Elsevier Interactive Patient Education  2017 [employer].  

## 2017-12-26 NOTE — Progress Notes (Signed)
[facility] [place]   CC: Follow up Abdominal Aortic Aneurysm  History of Present Illness  [name] is a 67 y.o. (June 15, 1951) male whom Dr. Arbie Cookey has been monitoring for a small abdominal aortic aneurysm.  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  He is seen today for one-year ultrasound follow-up to rule out any enlargement. He had an emergent coronary artery bypass grafting in [facility] about April 2016. Had chest pain and was found to have three-vessel disease and underwent uneventful bypass.  He had a heart cath in January 2018 for chest pain, no intervention.   He has no symptoms referable to his aneurysm and no symptoms of lower extremity arterial insufficiency.  The patient denies history of stroke or TIA symptoms.  Pt Diabetic: No Pt smoker: smoker (1.5 ppd, started at age 61 yrs)    Past Medical History:  Diagnosis Date  . AAA (abdominal aortic aneurysm) (HCC)   . Anxiety   . Arthritis   . COPD (chronic obstructive pulmonary disease) (HCC)   . 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 MI (myocardial infarction) (HCC)   . Pulmonary nodule    benign   Past Surgical History:  Procedure Laterality Date  . CARDIAC CATHETERIZATION  01/2015   Coronary angiography on 02/17/15 demonstrated the left main coronary artery to have mild 30-40% stenosis, 100% LAD stenosis with right to left collaterals and faint left to left collaterals, small nondominant left circumflex, small to medium caliber first obtuse marginal branch with 20% ostial and proximal stenosis. The RCA was noted to have diffuse disease.  Marland Kitchen CARDIAC CATHETERIZATION N/A 11/11/2016   Procedure: Left Heart Cath and  Cors/Grafts Angiography;  Surgeon: Marykay Lex, MD;  Location: [facility] INVASIVE CV LAB;  Service: Cardiovascular;  Laterality: N/A;  . CORONARY ARTERY BYPASS GRAFT  01/2015  . INGUINAL HERNIA REPAIR Bilateral   . VALVE REPLACEMENT  01/2015   Tricuspid Valve Replacement   Social History Social History   Socioeconomic History  . Marital status: Divorced    Spouse name: Not on file  . Number of children: Not on file  . Years of education: Not on file  . Highest education level: Not on file  Social Needs  . Financial resource strain: Not on file  . Food insecurity - worry: Not on file  . Food insecurity - inability: Not on file  . Transportation needs - medical: Not on file  . Transportation needs - non-medical: Not on file  Occupational History  . Not on file  Tobacco Use  . Smoking status: Current Every Day Smoker    Packs/day: 2.00    Years: 60.00    Pack years: 120.00    Types: Cigarettes    Last attempt to quit: 02/11/2015    Years since quitting: 2.8  . Smokeless tobacco: Never Used  Substance and Sexual Activity  . Alcohol use: Yes    Alcohol/week: 0.6 oz    Types: 1 Cans of beer per week    Comment: one beer a week  . Drug use: No  . Sexual activity: Not on file  Other Topics Concern  . Not on file  Social History Narrative  .  Not on file   Family History Family History  Problem Relation Age of Onset  . Heart disease Father   . Heart attack Sister        Died in her 2950s  . CAD Brother   . Heart attack Brother   . CAD Brother   . Heart attack Brother     Current Outpatient Medications on File Prior to Visit  Medication Sig Dispense Refill  . ALPRAZolam (XANAX) 1 MG tablet Take 1 mg by mouth 4 (four) times daily as needed for anxiety.   2  . aspirin EC 81 MG tablet Take 81 mg by mouth daily.     Marland Kitchen. co-enzyme Q-10 30 MG capsule Take 1 capsule (30 mg total) by mouth 3 (three) times daily. 30 capsule 5  . famotidine (PEPCID) 20 MG tablet Take 1 tablet (20 mg  total) by mouth 2 (two) times daily. 60 tablet 2  . HYDROcodone-acetaminophen (NORCO) 7.5-325 MG tablet Take 1 tablet by mouth every 6 (six) hours as needed for moderate pain.   0  . Omega-3 Fatty Acids (FISH OIL) 1000 MG CAPS Take 2 capsules (2,000 mg total) by mouth 2 (two) times daily. 100 capsule 5  . pravastatin (PRAVACHOL) 20 MG tablet Take 1 tablet (20 mg total) by mouth daily. 30 tablet 3  . traMADol (ULTRAM) 50 MG tablet Take 1 tablet (50 mg total) by mouth every 6 (six) hours as needed. 12 tablet 0  . nitroGLYCERIN (NITROSTAT) 0.4 MG SL tablet Place 1 tablet (0.4 mg total) under the tongue every 5 (five) minutes as needed for chest pain. 90 tablet 3   No current facility-administered medications on file prior to visit.    No Known Allergies  ROS: See HPI for pertinent positives and negatives.  Physical Examination  Vitals:   12/26/17 0841  BP: 117/69  Pulse: (!) 59  Resp: 20  SpO2: 98%  Weight: 141 lb (64 kg)  Height: 5\' 7"  (1.702 m)   Body mass index is 22.08 kg/m.  General: A&O x 3, WD, thin male. Gait: Normal HEENT: No gross abnormalities  Pulmonary: Sym exp, respirations are non labored, good air movement in all fields, CTAB, no rales, rhonchi, or wheezing. Cardiac: RRR, Nl S1, S2, no detected murmur.   Carotid Bruits Right Left   Negative positive   Abdominal aortic pulse is moderately palpable Radial pulses are 2+ palpable and =   VASCULAR EXAM:  LE Pulses Right Left  FEMORAL palpable palpable  POPLITEAL not palpable not palpable  POSTERIOR TIBIAL not palpable not palpable  DORSALIS PEDIS ANTERIOR TIBIAL not palpable not palpable    Gastrointestinal: soft, NTND, -G/R, - HSM, - masses palpated, - CVAT B. Musculoskeletal: M/S 5/5 throughout, Extremities without ischemic changes. Skin: No rashes, no ulcers, no cellulitis.   Neurologic: CN 2-12 intact, Pain and light touch  intact in extremities are intact, Motor exam as listed above. Psychiatric: Normal thought content, mood appropriate to clinical situation.    DATA  AAA Duplex (12/26/2017):  Previous size: 3.83 cm (Date: 10-22-16) Common iliac arteries not well visualized due to overlying bowel gas   Current size:  4.1 cm (Date: 12/26/17); Right CIA: 1.8 cm; Left CIA: 1.7 cm   Medical Decision Making  The patient is a 67 y.o. male who presents with asymptomatic AAA with slight increase in size to 4.1 cm from 3.8 cm in fourteen months.  Left carotid bruit, lifelong heavy smoker (was 3 ppd) with hx of CAD and  CABG; no carotid duplex results on file, will obtain carotid duplex at his next visit. He has no hx of stroke or TIA.    Based on this patient's exam and diagnostic studies, the patient will follow up in 1 year  with the following studies: AAA duplex and carotid duplex.  Consideration for repair of AAA would be made when the size is 5.0 cm, growth > 1 cm/yr, and symptomatic status.       Consideration for repair of common iliac artery aneurysm would be made when the size is 3.0 cm or larger.   The patient was counseled re smoking cessation and given several free resources re smoking cessation.        The patient was given information about AAA including signs, symptoms, treatment, and how to minimize the risk of enlargement and rupture of aneurysms.    I emphasized the importance of maximal medical management including strict control of blood pressure, blood glucose, and lipid levels, antiplatelet agents, obtaining regular exercise, and cessation of smoking.   The patient was advised to call 911 should the patient experience sudden onset abdominal or back pain.   Thank you for allowing [place] to participate in this patient's care.  Charisse March, RN, MSN, FNP-C [facility] [place] Office: [phone]  Clinic Physician: Randie Heinz  12/26/2017, 8:52 AM

## 2017-12-31 DIAGNOSIS — G894 Chronic pain syndrome: Secondary | ICD-10-CM | POA: Diagnosis not present

## 2017-12-31 DIAGNOSIS — Z6822 Body mass index (BMI) 22.0-22.9, adult: Secondary | ICD-10-CM | POA: Diagnosis not present

## 2017-12-31 DIAGNOSIS — F419 Anxiety disorder, unspecified: Secondary | ICD-10-CM | POA: Diagnosis not present

## 2018-01-15 ENCOUNTER — Other Ambulatory Visit: Payer: Self-pay

## 2018-01-15 ENCOUNTER — Emergency Department (HOSPITAL_COMMUNITY)
Admission: EM | Admit: 2018-01-15 | Discharge: 2018-01-15 | Disposition: A | Discharge status: Home / Self Care | Payer: Medicare Other | Attending: Emergency Medicine | Admitting: Emergency Medicine

## 2018-01-15 ENCOUNTER — Encounter (HOSPITAL_COMMUNITY): Payer: Self-pay | Admitting: Emergency Medicine

## 2018-01-15 ENCOUNTER — Emergency Department (HOSPITAL_COMMUNITY): Payer: Medicare Other

## 2018-01-15 ENCOUNTER — Other Ambulatory Visit: Payer: Self-pay | Admitting: Medical

## 2018-01-15 DIAGNOSIS — Z951 Presence of aortocoronary bypass graft: Secondary | ICD-10-CM | POA: Insufficient documentation

## 2018-01-15 DIAGNOSIS — R0789 Other chest pain: Secondary | ICD-10-CM

## 2018-01-15 DIAGNOSIS — Z7982 Long term (current) use of aspirin: Secondary | ICD-10-CM | POA: Insufficient documentation

## 2018-01-15 DIAGNOSIS — M542 Cervicalgia: Secondary | ICD-10-CM | POA: Insufficient documentation

## 2018-01-15 DIAGNOSIS — I2510 Atherosclerotic heart disease of native coronary artery without angina pectoris: Secondary | ICD-10-CM | POA: Diagnosis not present

## 2018-01-15 DIAGNOSIS — J449 Chronic obstructive pulmonary disease, unspecified: Secondary | ICD-10-CM | POA: Insufficient documentation

## 2018-01-15 DIAGNOSIS — R072 Precordial pain: Secondary | ICD-10-CM | POA: Diagnosis not present

## 2018-01-15 DIAGNOSIS — R6884 Jaw pain: Secondary | ICD-10-CM | POA: Diagnosis not present

## 2018-01-15 DIAGNOSIS — Z79899 Other long term (current) drug therapy: Secondary | ICD-10-CM | POA: Diagnosis not present

## 2018-01-15 DIAGNOSIS — R079 Chest pain, unspecified: Secondary | ICD-10-CM

## 2018-01-15 DIAGNOSIS — F17210 Nicotine dependence, cigarettes, uncomplicated: Secondary | ICD-10-CM | POA: Diagnosis not present

## 2018-01-15 DIAGNOSIS — Z720 Tobacco use: Secondary | ICD-10-CM

## 2018-01-15 DIAGNOSIS — R0602 Shortness of breath: Secondary | ICD-10-CM | POA: Diagnosis not present

## 2018-01-15 DIAGNOSIS — R110 Nausea: Secondary | ICD-10-CM | POA: Diagnosis not present

## 2018-01-15 DIAGNOSIS — M25511 Pain in right shoulder: Secondary | ICD-10-CM | POA: Diagnosis not present

## 2018-01-15 DIAGNOSIS — I252 Old myocardial infarction: Secondary | ICD-10-CM | POA: Insufficient documentation

## 2018-01-15 DIAGNOSIS — M25512 Pain in left shoulder: Secondary | ICD-10-CM | POA: Diagnosis not present

## 2018-01-15 DIAGNOSIS — R5383 Other fatigue: Secondary | ICD-10-CM | POA: Diagnosis not present

## 2018-01-15 LAB — I-STAT TROPONIN, ED
TROPONIN I, POC: 0 ng/mL (ref 0.00–0.08)
Troponin i, poc: 0 ng/mL (ref 0.00–0.08)

## 2018-01-15 LAB — BASIC METABOLIC PANEL
Anion gap: 9 (ref 5–15)
BUN: 9 mg/dL (ref 6–20)
CHLORIDE: 105 mmol/L (ref 101–111)
CO2: 24 mmol/L (ref 22–32)
Calcium: 9.2 mg/dL (ref 8.9–10.3)
Creatinine, Ser: 0.94 mg/dL (ref 0.61–1.24)
GFR calc Af Amer: >60 mL/min (ref >60)
GFR calc non Af Amer: >60 mL/min (ref >60)
Glucose, Bld: 99 mg/dL (ref 65–99)
POTASSIUM: 4.3 mmol/L (ref 3.5–5.1)
SODIUM: 138 mmol/L (ref 135–145)

## 2018-01-15 LAB — CBC
HEMATOCRIT: 41.1 % (ref 39.0–52.0)
Hemoglobin: 14.1 g/dL (ref 13.0–17.0)
MCH: 35 pg — AB (ref 26.0–34.0)
MCHC: 34.3 g/dL (ref 30.0–36.0)
MCV: 102 fL — AB (ref 78.0–100.0)
Platelets: 195 10*3/uL (ref 150–400)
RBC: 4.03 MIL/uL — ABNORMAL LOW (ref 4.22–5.81)
RDW: 13.6 % (ref 11.5–15.5)
WBC: 6.9 10*3/uL (ref 4.0–10.5)

## 2018-01-15 IMAGING — DX DG CHEST 2V
2 series · 2 of 2 positions shown · non-contrast
Comparison: [DATE]

CLINICAL DATA: Left and midline chest pain. Left jaw and arm pain.
Shortness of breath, nausea.

EXAM:
CHEST - 2 VIEW

[chest pa]
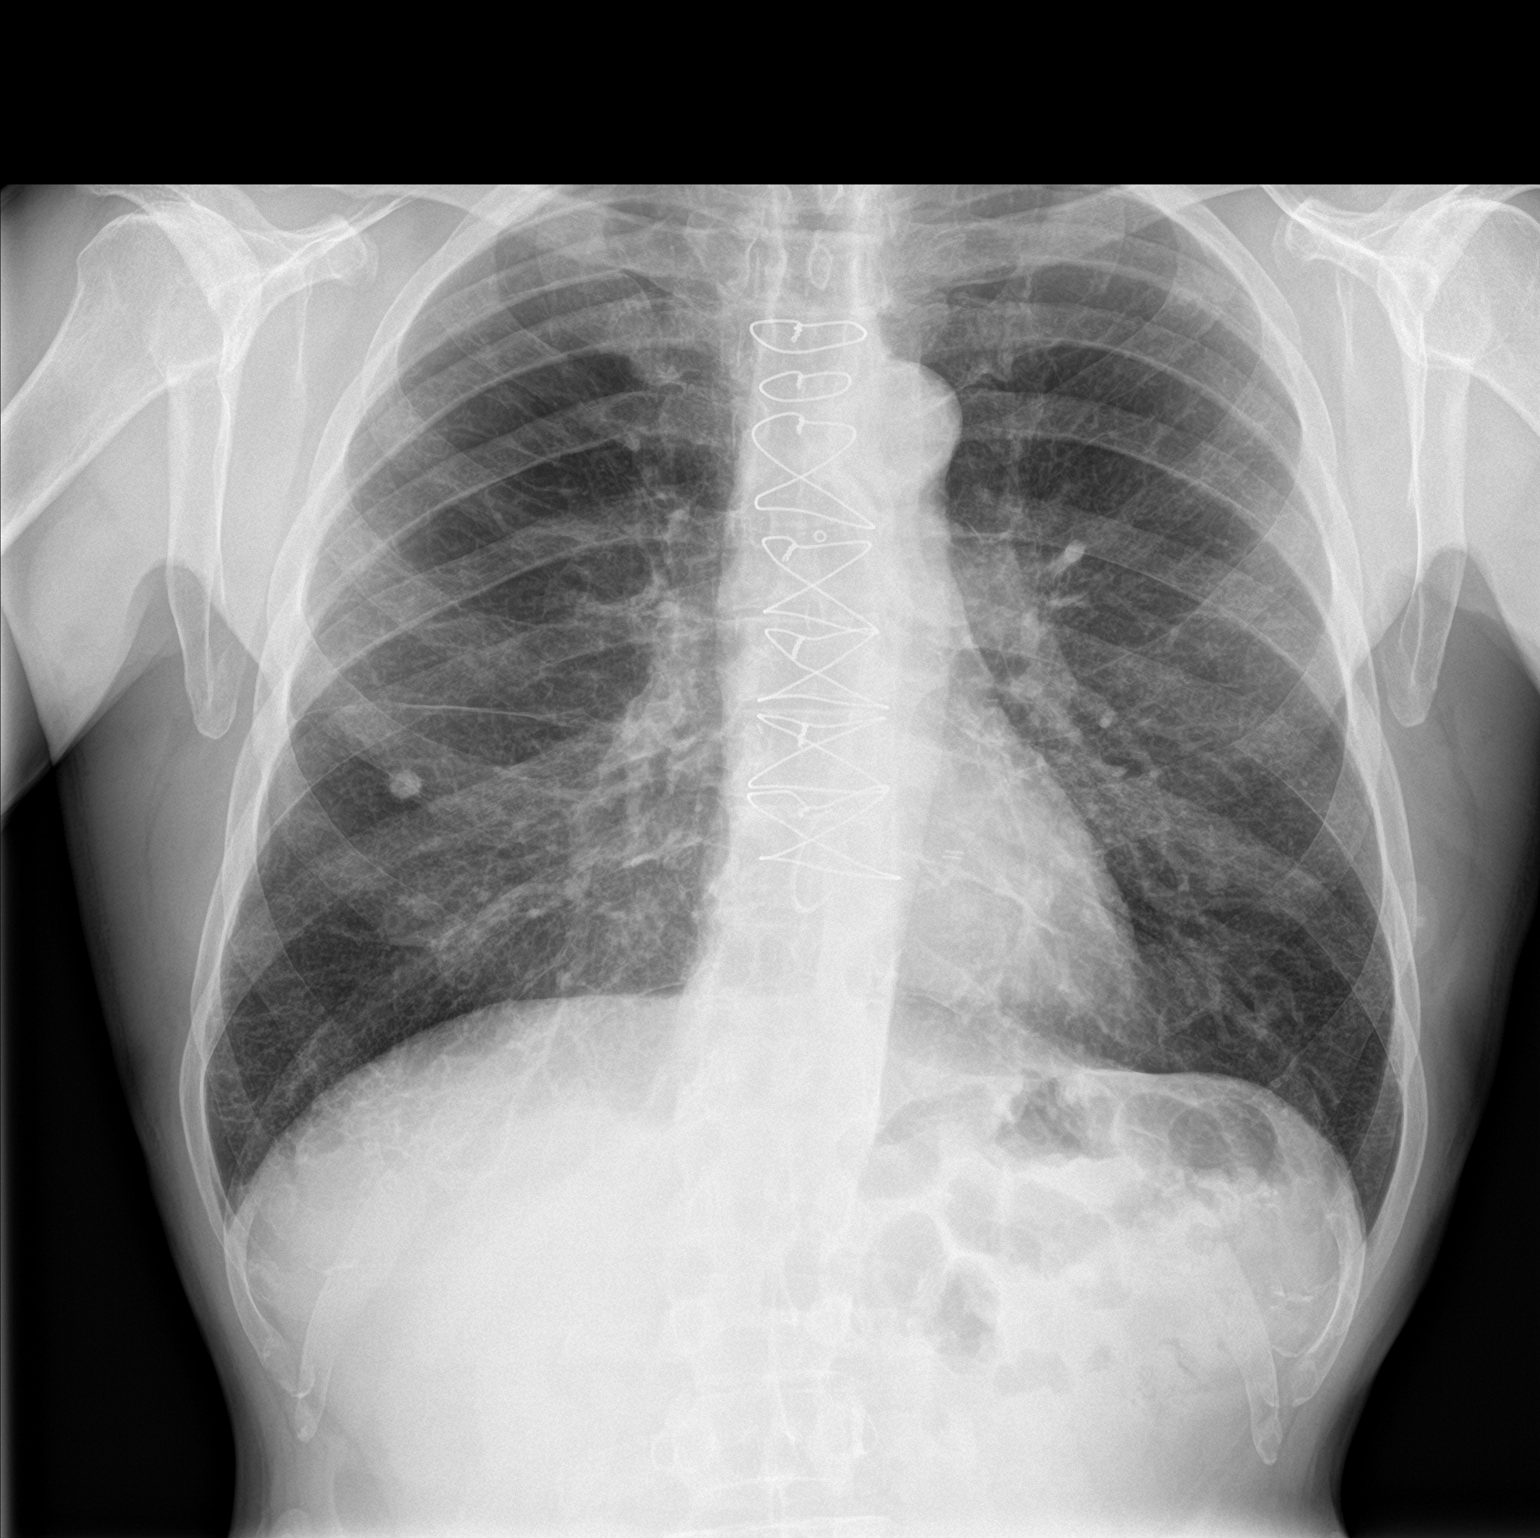

[chest lat]
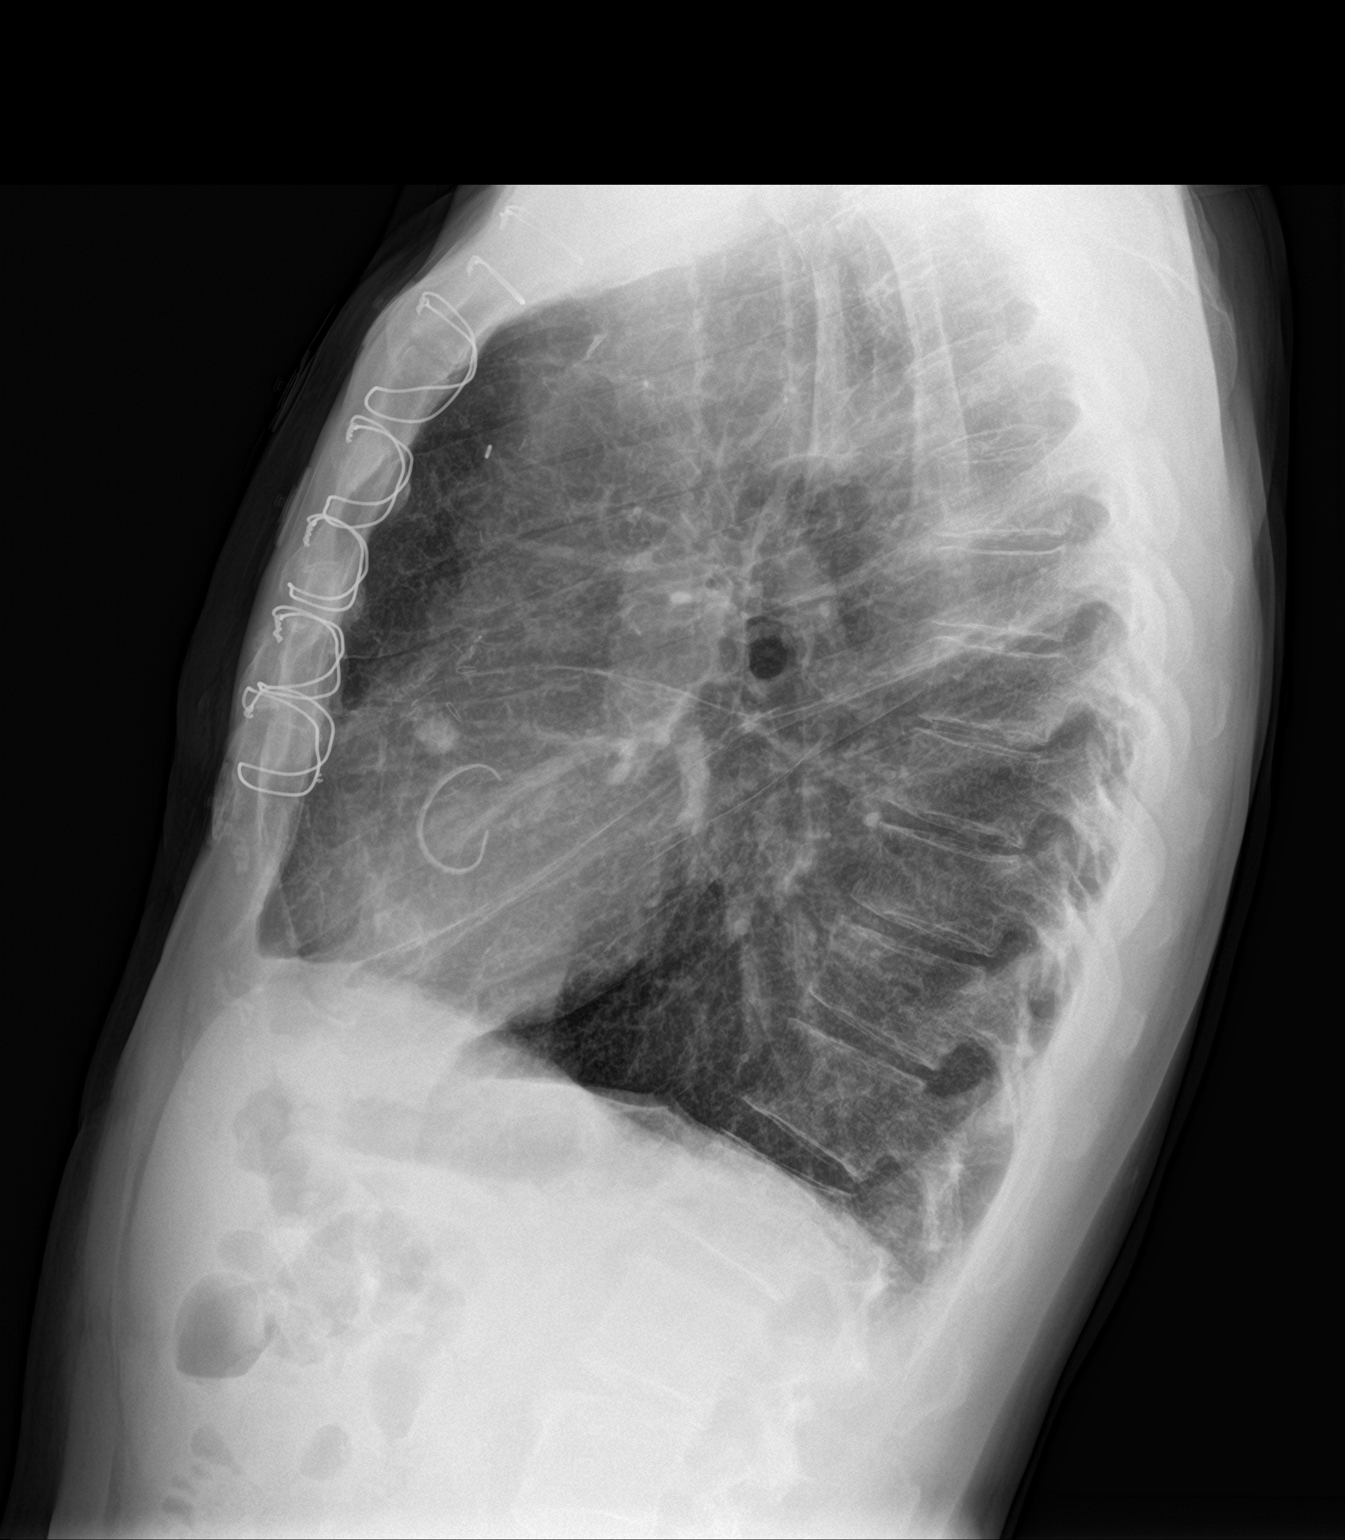

[2 of 2 positions shown; findings below may reference images not displayed]

FINDINGS: Calcified granuloma in the right mid lung. Prior median sternotomy.
Heart is normal size. No confluent acute airspace opacities or
effusions. There is hyperinflation of the lungs compatible with
COPD.
IMPRESSION: COPD.  No active disease.

## 2018-01-15 MED ORDER — NITROGLYCERIN 0.4 MG SL SUBL: 0.4000 mg | SUBLINGUAL_TABLET | SUBLINGUAL | Status: DC | PRN

## 2018-01-15 MED ORDER — ASPIRIN 81 MG PO CHEW
243.0000 mg | CHEWABLE_TABLET | Freq: Once | ORAL | Status: AC
  Administered 2018-01-15: 243 mg via ORAL
  Filled 2018-01-15: qty 3

## 2018-01-15 NOTE — ED Triage Notes (Signed)
Patient complains of burning chest pain, right jaw, numbing left arm pain and headache for the last few months. History of open heart surgery in 2015. Denies any changes in shortness of breath. Alert and oriented and in no apparent distress at this time.

## 2018-01-15 NOTE — Discharge Instructions (Addendum)
° °  You have a Stress Test scheduled at [facility]. Your doctor has ordered this test to check the blood flow in your heart arteries.  Please arrive 15 minutes early for paperwork. The whole test will take several hours. You may want to bring reading material to remain occupied while undergoing different parts of the test.  Instructions:  No food/drink after midnight the night before.  It is OK to take your morning meds with a sip of water EXCEPT for those types of medicines listed below or otherwise instructed.  No caffeine/decaf products 24 hours before, including medicines such as Excedrin or Goody Powders. Call if there are any questions.   Wear comfortable clothes and shoes.   Special Medication Instructions:  Beta blockers such as metoprolol (Lopressor/Toprol XL), atenolol (Tenormin), carvedilol (Coreg), nebivolol (Bystolic), bisoprolol (Zebeta), propranolol (Inderal) should not be taken for 24 hours before the test.  Calcium channel blockers such as diltiazem (Cardizem) or verapmil (Calan) should not be taken for 24 hours before the test.  Remove nitroglycerin patches and do not take nitrate preparations such as Imdur/isosorbide the day of your test.  No Persantine/Theophylline or Aggrenox medicines should be used within 24 hours of the test.   If you are diabetic, please ask which medications to hold the day of the test.  What To Expect: When you arrive in the lab, the technician will inject a small amount of radioactive tracer into your arm through an IV while you are resting quietly. This helps us to form pictures of your heart. You will likely only feel a sting from the IV. After a waiting period, resting pictures will be obtained under a big camera. These are the "before" pictures.  Next, you will be prepped for the stress portion of the test. This may include either walking on a treadmill or receiving a medicine that helps to dilate blood vessels in  your heart to simulate the effect of exercise on your heart. If you are walking on a treadmill, you will walk at different paces to try to get your heart rate to a goal number that is based on your age. If your doctor has chosen the pharmacologic test, then you will receive a medicine through your IV that may cause temporary nausea, flushing, shortness of breath and sometimes chest discomfort or vomiting. This is typically short-lived and usually resolves quickly. If you experience symptoms, that does not automatically mean the test is abnormal. Some patients do not experience any symptoms at all. Your blood pressure and heart rate will be monitored, and we will be watching your EKG on a computer screen for any changes. During this portion of the test, the radiologist will inject another small amount of radioactive tracer into your IV. After a waiting period, you will undergo a second set of pictures. These are the "after" pictures.  The doctor reading the test will compare the before-and-after images to look for evidence of heart blockages or heart weakness. The test usually takes 1 day to complete, but in certain instances (for example, if a patient is over a certain weight limit), the test may be done over the span of 2 days.  _______________________________________________________________________________________________________________________________________________________________________________________________  Please take your medications as they are prescribed. You were started on Carvedilol (coreg) 3.125mg  twice a day for heart failure and blood pressure control. You were also started on atorvastatin (lipitor) 40mg  daily for cholesterol control. Continue to take a baby aspirin daily.

## 2018-01-15 NOTE — ED Notes (Signed)
Patient denies chest pain at this time. He states "my jaw and head about to bust"

## 2018-01-15 NOTE — Consult Note (Addendum)
Cardiology Consultation:   Patient ID: [name]; [id number]; [id number]   Admit date: 01/15/2018 Date of Consult: 01/15/2018  Primary Care Provider: [facility] Primary Cardiologist: Sean Docker, MD Primary Electrophysiologist:  None   Patient Profile:   [name] is a 67 y.o. male with a PMH CAD s/p MI with subsequent CABG 2016, Tricuspid valve repair 2016, AAA followed by VascSx, chronic systolic CHF, HTN, HLD, COPD, medication non-compliance, and ongoing tobacco abuse who is being seen today for the evaluation of chest pain at the request of [name].  History of Present Illness:   [name] was in his usual state of health until 2-3 weeks ago when he began experiencing waxing and waning chest pain which radiates to his L arm and jaw. He has not noticed any aggravating or alleviating factors and states each episode can last up to a couple hours at a time. He notes persistent nausea without vomiting over the past 1-2 weeks, which he attributes to a viral illness. He denies associated SOB, diaphoresis, lightheadedness, or syncope. 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 He reports this episode is not as bad as the symptoms he experienced on presentation prior to CABG. He denies fevers, weight loss/gain, abdominal pain, diarrhea, LE edema, orthopnea, PND, or palpitations.   He is currently chest pain free, however still experiencing right-sided jaw pain. He states he is only taking a baby aspirin and hydrocodone daily. He was last evaluated by cardiology outpatient 01/2017 with complaints of dizziness at that time. He was instructed to decrease his lasix and amiodarone, and to continue ASA, statin, coreg, and plavix at that time. Patient does not recall why he was taking amiodarone and states he ran out of his other prescriptions the last time he attempted to get them refilled. He continues to smoke 3ppd.   In  the ED, VSS. Labs notable for electrolytes wnl, Cr 0.94, Hgb 14.1, PLT 195, Troponin 0.00 x1. EKG with sinus rhythm, Q waves in anteroseptal leads (chronic), otherwise non-ischemic. CXR without acute findings. Patient was given ASA to complete full dose. Cardiology consulted for further recommendations.   Past Medical History:  Diagnosis Date  . AAA (abdominal aortic aneurysm) (HCC)   . Anxiety   . Arthritis   . COPD (chronic obstructive pulmonary disease) (HCC)   . 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 MI (myocardial infarction) (HCC)   . Pulmonary nodule    benign    Past Surgical History:  Procedure Laterality Date  . CARDIAC CATHETERIZATION  01/2015   Coronary angiography on 02/17/15 demonstrated the left main coronary artery to have mild 30-40% stenosis, 100% LAD stenosis with right to left collaterals and faint left to left collaterals, small nondominant left circumflex, small to medium caliber first obtuse marginal branch with 20% ostial and proximal stenosis. The RCA was noted to have diffuse disease.  Marland Kitchen CARDIAC CATHETERIZATION N/A 11/11/2016   Procedure: Left Heart Cath and Cors/Grafts Angiography;  Surgeon: Marykay Lex, MD;  Location: [facility] INVASIVE CV LAB;  Service: Cardiovascular;  Laterality: N/A;  . CORONARY ARTERY BYPASS GRAFT  01/2015  . INGUINAL HERNIA REPAIR Bilateral   . VALVE REPLACEMENT  01/2015   Tricuspid Valve Replacement     Home Medications:  Prior to Admission medications   Medication Sig  Start Date End Date Taking? Authorizing Provider  ALPRAZolam Prudy Feeler) 1 MG tablet Take 1 mg by mouth 4 (four) times daily as needed for anxiety.  09/17/16  Yes [provider]  aspirin EC 81 MG tablet Take 81 mg by mouth daily.    Yes [provider]  HYDROcodone-acetaminophen (NORCO) 7.5-325 MG tablet Take 1 tablet by mouth  every 6 (six) hours as needed for moderate pain.  08/16/16  Yes [provider]  co-enzyme Q-10 30 MG capsule Take 1 capsule (30 mg total) by mouth 3 (three) times daily. Patient not taking: Reported on 01/15/2018 05/24/17   Houston Siren, MD  famotidine (PEPCID) 20 MG tablet Take 1 tablet (20 mg total) by mouth 2 (two) times daily. Patient not taking: Reported on 01/15/2018 05/24/17   Houston Siren, MD  nitroGLYCERIN (NITROSTAT) 0.4 MG SL tablet Place 1 tablet (0.4 mg total) under the tongue every 5 (five) minutes as needed for chest pain. 10/24/16 05/23/17  Jodelle Gross, NP  Omega-3 Fatty Acids (FISH OIL) 1000 MG CAPS Take 2 capsules (2,000 mg total) by mouth 2 (two) times daily. Patient not taking: Reported on 01/15/2018 05/24/17   Houston Siren, MD  pravastatin (PRAVACHOL) 20 MG tablet Take 1 tablet (20 mg total) by mouth daily. Patient not taking: Reported on 01/15/2018 05/24/17   Houston Siren, MD  traMADol (ULTRAM) 50 MG tablet Take 1 tablet (50 mg total) by mouth every 6 (six) hours as needed. Patient not taking: Reported on 01/15/2018 07/15/17   Ivery Quale, PA-C    Inpatient Medications: Scheduled Meds:  Continuous Infusions:  PRN Meds: nitroGLYCERIN  Allergies:   No Known Allergies  Social History:   Social History   Socioeconomic History  . Marital status: Divorced    Spouse name: Not on file  . Number of children: Not on file  . Years of education: Not on file  . Highest education level: Not on file  Occupational History  . Not on file  Social Needs  . Financial resource strain: Not on file  . Food insecurity:    Worry: Not on file    Inability: Not on file  . Transportation needs:    Medical: Not on file    Non-medical: Not on file  Tobacco Use  . Smoking status: Current Every Day Smoker    Packs/day: 3.00    Years: 60.00    Pack years: 180.00    Types: Cigarettes    Last attempt to quit: 02/11/2015    Years since quitting: 2.9  . Smokeless tobacco: Never Used    Substance and Sexual Activity  . Alcohol use: Yes    Alcohol/week: 0.6 oz    Types: 1 Cans of beer per week    Comment: one beer a week  . Drug use: No  . Sexual activity: Not on file  Lifestyle  . Physical activity:    Days per week: Not on file    Minutes per session: Not on file  . Stress: Not on file  Relationships  . Social connections:    Talks on phone: Not on file    Gets together: Not on file    Attends religious service: Not on file    Active member of club or organization: Not on file    Attends meetings of clubs or organizations: Not on file    Relationship status: Not on file  . Intimate partner violence:    Fear of current or ex partner: Not on file  Emotionally abused: Not on file    Physically abused: Not on file    Forced sexual activity: Not on file  Other Topics Concern  . Not on file  Social History Narrative  . Not on file    Family History:    Family History  Problem Relation Age of Onset  . Heart disease Father   . Heart attack Sister        Died in her 35s  . CAD Brother   . Heart attack Brother   . CAD Brother   . Heart attack Brother      ROS:  Please see the history of present illness.   All other ROS reviewed and negative.     Physical Exam/Data:   Vitals:   01/15/18 1230 01/15/18 1245 01/15/18 1300 01/15/18 1315  BP: 117/63 123/70 120/61 136/74  Pulse: 62 65 64 69  Resp: 16 13 20 13   Temp:      TempSrc:      SpO2: 97% 98% 98% 99%  Weight:      Height:       No intake or output data in the 24 hours ending 01/15/18 1341 Filed Weights   01/15/18 1104  Weight: 141 lb (64 kg)   Body mass index is 22.76 kg/m.  General:  Thin elderly male laying in bed in no acute distress HEENT: sclera anicteric; R jaw line TTP; s/p removal of lower teeth with an apparent divot in R gum line. No erythema or open wounds. No leukoplakia appreciated.   Neck: no JVD Vascular: No carotid bruits; distal pulses 2+ bilaterally Cardiac:  normal  S1, S2; RRR; no murmurs, gallops, or rubs Lungs:  clear to auscultation bilaterally, no wheezing, rhonchi or rales  Abd: NABS, soft, nontender, no hepatomegaly Ext: no edema Musculoskeletal:  No deformities, BUE and BLE strength normal and equal Skin: warm and dry; wrinkled skin consistent with long history of smoking Neuro:  CNs 2-12 intact, no focal abnormalities noted Psych:  Normal affect   EKG:  The EKG was personally reviewed and demonstrates:  sinus rhythm, Q waves in anteroseptal leads, otherwise non-ischemic. Telemetry:  Telemetry was personally reviewed and demonstrates:  NSR with intermittent sinus brady.   Relevant CV Studies:   Left Heart Catheterization: 10/2016: Conclusion     Ost LAD to Prox LAD lesion, 100 %stenosed. Short reconstitution from LIMA with Prox LAD to Mid LAD lesion, 95 %stenosed.  LIMA-mLAD and is normal in caliber and anatomically normal. Dist-apical LAD lesion, 60 %stenosed.  Prox RCA to Dist RCA lesion, 100 %stenosed.  Seq SVG- dRCA and is large and anatomically normal - fills small rPDA & RPAV with 2 RPLs  Patent non-dominant Circumflex with minimal CAD.  _________________________________________  There is mild left ventricular systolic dysfunction. The left ventricular ejection fraction is 45-50% by visual estimate.  LV end diastolic pressure is mildly elevated.    Widely patent grafts and native circumflex with known occlusion of the LAD and now occluded RCA. No obvious culprit lesion to explain the patient's angina besides the short segment of the proximal LAD isolated by proximal occlusion and distal 95% stenosis. No PCI targets.  Plan: Return to short stay for TR band removal Discharge following bed rest.  Recommendations:  Optimize medical management with antianginal therapy.  Smoking cessation counseling    Echocardiogram 10/2016: Study Conclusions  - Left ventricle: The cavity size was normal. Wall thickness was    increased in a pattern of mild LVH. Systolic function  was mildly   reduced. The estimated ejection fraction was 45%. - Regional wall motion abnormality: Moderate hypokinesis of the   mid-apical anterior, basal anteroseptal, basal-mid inferoseptal,   and apical septal myocardium; mild hypokinesis of the mid   anteroseptal and apical myocardium. - Aortic valve: Mildly thickened, moderately calcified leaflets.   Morphologically, there Phillips be very mild aortic stenosis. - Mitral valve: Mildly calcified annulus. Normal thickness leaflets   . There was mild regurgitation. - Left atrium: The atrium was mildly dilated. - Right ventricle: Systolic function was mildly reduced. - Tricuspid valve: S/p tricuspid valve repair using a 26 mm Edwards   [employer] M3 annuloplasty ring. There was trivial   regurgitation. - Pulmonary arteries: PA peak pressure: 35 mm Hg (S). - Systemic veins: Dilated IVC with normal respiratory variation.   Estimated CVP 8 mmHg.   Laboratory Data:  Chemistry Recent Labs  Lab 01/15/18 1118  NA 138  K 4.3  CL 105  CO2 24  GLUCOSE 99  BUN 9  CREATININE 0.94  CALCIUM 9.2  GFRNONAA >60  GFRAA >60  ANIONGAP 9    No results for input(s): PROT, ALBUMIN, AST, ALT, ALKPHOS, BILITOT in the last 168 hours. Hematology Recent Labs  Lab 01/15/18 1118  WBC 6.9  RBC 4.03*  HGB 14.1  HCT 41.1  MCV 102.0*  MCH 35.0*  MCHC 34.3  RDW 13.6  PLT 195   Cardiac EnzymesNo results for input(s): TROPONINI in the last 168 hours.  Recent Labs  Lab 01/15/18 1124  TROPIPOC 0.00    BNPNo results for input(s): BNP, PROBNP in the last 168 hours.  DDimer No results for input(s): DDIMER in the last 168 hours.  Radiology/Studies:  Dg Chest 2 View  Result Date: 01/15/2018 CLINICAL DATA:  Left and midline chest pain. Left jaw and arm pain. Shortness of breath, nausea. EXAM: CHEST - 2 VIEW COMPARISON:  03/23/2017 FINDINGS: Calcified granuloma in the right mid lung. Prior median  sternotomy. Heart is normal size. No confluent acute airspace opacities or effusions. There is hyperinflation of the lungs compatible with COPD. IMPRESSION: COPD.  No active disease. Electronically Signed   By: Charlett NoseKevin  Dover M.D.   On: 01/15/2018 11:34    Assessment and Plan:   1. Atypical CP in patient with CAD s/p CABG 2016: p/w left sided chest pain radiating to L arm and jaw, without aggravating or alleviating factors which lasts up to 2 hours at a time. EKG with sinus rhythm, Q waves in anteroseptal leads (chronic), otherwise non-ischemic. Trop negative x1. CXR suggestive of COPD. Risk factors for ACS include CAD with prior CABG, HTN, HLD, ongoing tobacco abuse. Patient received ASA to complete full dose. CP improved, still with jaw pain. [facility] grafts, without obvious culprit lesions; recommended for medical management with antianginal therapy. Last Echo 10/2016 with EF 45%, mild-moderate hypokinesis in apical myocardium. Risk factors for ACS include - Repeat troponin x1 - if negative, will arrange outpatient NST in [place] with cardiology follow-up.  - Continue ASA  - Restart atorvastatin 40mg  daily  2. Chronic systolic CHF: Last Echo 10/2016 with EF 45%, mild-moderate hypokinesis in apical myocardium. Has not been on a BBlocker or ACEi/ARB since that time with many reports of medication non-compliance.   - Will restart coreg 3.125mg  BID - Can consider adding ACEi/ARB outpatient if BP allows  3. HTN: BP stable. Not currently on medications - Will restart coreg 3.125mg  BID - Can consider adding ACEi/ARB outpatient if BP  allows  4. HLD: no documented Lipids. Not currently on medications - Check lipid profile - Will restart atorvastatin 40mg    5. AAA: followed outpatient by Vascular surgery. Last duplex 12/2017 with 4.1cm diameter. Patient remains asymptomatic - Continue outpatient monitoring per vascular surgery  6. S/p tricuspid valve repair: repaired in 2016 in  conjuncture with CABG with 26 mm [employer] M3 annuloplasty ring. With trivial regurg on last echo 10/2016.  7. Tobacco abuse: still smoking 3ppd - Smoking cessation encouraged - Can offer nicotine patch if remains in the hospital.     For questions or updates, please contact [facility] Please consult [email or web address] for contact info under Cardiology/STEMI.   Signed, Beatriz Stallion, PA-C  01/15/2018 1:41 PM [phone]  Personally seen and examined. Agree with above.  67 year old male with coronary artery disease status post bypass surgery and tricuspid valve repair back in 2016 with atypical chest pain.  Currently chest pain-free.  Had some residual jaw pain which has been lasting for several days.  Does not have any lower level teeth.  Overall he feels well currently.  Exam: Regular rate and rhythm no appreciable murmurs rubs or gallops, prior bypass scar noted, no JVD, moves all extremities, no edema, alert.  Labs: Initial troponin is normal.  We are waiting on a delta troponin.  Once again, he has had atypical chest pain for several days.  Chest x-ray: COPD-like findings  ECG: No ischemic changes.  Personally viewed  Assessment and plan:  67 year old male with coronary artery disease status post CABG 3 years ago at [facility] with 180-pack-year smoking history, 3 packs/day since age 21 years old, semiretired truck driver, with peripheral vascular disease, AAA followed by vascular surgery, chronic mild systolic heart failure ejection fraction 45% with hypertension hyperlipidemia COPD, medication noncompliance here with atypical chest discomfort, jaw pain.  -Obtain a second troponin, to observe a delta.  If normal, we are comfortable with him being discharged from the emergency department given the atypical nature of his pain but because of his known coronary artery disease status post CABG history, we will set him up for a nuclear stress test at [facility].  He would like to be discharged from the emergency department preferably, rather than waiting here overnight for a potential stress test here.  He has left AMA previously. -Strongly counseled on tobacco cessation.  3 packs/day.  He is at high risk for future cardiovascular events. - Encouraged him to take his medications such as carvedilol, atorvastatin.  He states that he has been taking his aspirin.  Continue aggressive secondary prevention. -We will obtain follow-up with Dr. Purvis Sheffield.   Donato Schultz, MD\

## 2018-01-15 NOTE — ED Notes (Signed)
Meal and drink given to pt.  

## 2018-01-15 NOTE — ED Provider Notes (Signed)
[facility] EMERGENCY DEPARTMENT Provider Note   CSN: [id number] Arrival date & time: 01/15/18  1056     History   Chief Complaint Chief Complaint  Patient presents with  . Chest Pain    HPI [name] is a 67 y.o. male.  The history is provided by the patient and medical records. No language interpreter was used.  Chest Pain   This is a Phillips problem. The current episode started more than 2 days ago. The problem occurs constantly. The problem has not changed since onset.The pain is present in the substernal region. The pain is at a severity of 5/10. The pain is moderate. The quality of the pain is described as heavy, pressure-like and dull. The pain radiates to the left jaw, left neck, right jaw, left shoulder, right neck and right shoulder. Associated symptoms include malaise/fatigue and nausea. Pertinent negatives include no abdominal pain, no back pain, no cough, no diaphoresis, no exertional chest pressure, no fever, no headaches, no irregular heartbeat, no leg pain, no lower extremity edema, no palpitations, no shortness of breath, no sputum production, no syncope and no vomiting. He has tried nothing for the symptoms. The treatment provided no relief. Risk factors include male gender.    Past Medical History:  Diagnosis Date  . AAA (abdominal aortic aneurysm) (HCC)   . Anxiety   . Arthritis   . COPD (chronic obstructive pulmonary disease) (HCC)   . 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 MI (myocardial infarction) (HCC)   . Pulmonary nodule    benign    Patient Active Problem List   Diagnosis Date Noted  . Elevated MCV 05/24/2017  . COPD (chronic obstructive pulmonary disease) (HCC) 05/23/2017  . Anxiety 05/23/2017  . Angina, class II (HCC) 11/11/2016  . Abnormal nuclear stress test 11/11/2016  . Chest pain  10/09/2016  . Coronary artery disease involving native heart with angina pectoris (HCC) 10/09/2016  . Tobacco abuse 10/09/2016  . Pure hypercholesterolemia 03/21/2015  . 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 AAA (abdominal aortic aneurysm) without rupture (HCC) 03/01/2014    Past Surgical History:  Procedure Laterality Date  . CARDIAC CATHETERIZATION  01/2015   Coronary angiography on 02/17/15 demonstrated the left main coronary artery to have mild 30-40% stenosis, 100% LAD stenosis with right to left collaterals and faint left to left collaterals, small nondominant left circumflex, small to medium caliber first obtuse marginal branch with 20% ostial and proximal stenosis. The RCA was noted to have diffuse disease.  Marland Kitchen CARDIAC CATHETERIZATION N/A 11/11/2016   Procedure: Left Heart Cath and Cors/Grafts Angiography;  Surgeon: Marykay Lex, MD;  Location: [facility] INVASIVE CV LAB;  Service: Cardiovascular;  Laterality: N/A;  . CORONARY ARTERY BYPASS GRAFT  01/2015  . INGUINAL HERNIA REPAIR Bilateral   . VALVE REPLACEMENT  01/2015   Tricuspid Valve Replacement        Home Medications    Prior to Admission medications   Medication Sig Start Date End Date Taking? Authorizing Provider  ALPRAZolam Prudy Feeler) 1 MG tablet Take 1 mg by mouth 4 (four) times daily as needed for anxiety.  09/17/16   [provider]  aspirin EC 81 MG tablet Take 81 mg by mouth daily.     [provider]  co-enzyme Q-10 30 MG capsule Take 1 capsule (30 mg  total) by mouth 3 (three) times daily. 05/24/17   Houston Siren, MD  famotidine (PEPCID) 20 MG tablet Take 1 tablet (20 mg total) by mouth 2 (two) times daily. 05/24/17   Houston Siren, MD  HYDROcodone-acetaminophen (NORCO) 7.5-325 MG tablet Take 1 tablet by mouth every 6 (six) hours as needed for moderate pain.  08/16/16   [provider]  nitroGLYCERIN (NITROSTAT) 0.4 MG SL tablet Place 1 tablet (0.4 mg total) under the tongue every 5 (five)  minutes as needed for chest pain. 10/24/16 05/23/17  Jodelle Gross, NP  Omega-3 Fatty Acids (FISH OIL) 1000 MG CAPS Take 2 capsules (2,000 mg total) by mouth 2 (two) times daily. 05/24/17   Houston Siren, MD  pravastatin (PRAVACHOL) 20 MG tablet Take 1 tablet (20 mg total) by mouth daily. 05/24/17   Houston Siren, MD  traMADol (ULTRAM) 50 MG tablet Take 1 tablet (50 mg total) by mouth every 6 (six) hours as needed. 07/15/17   Ivery Quale, PA-C    Family History Family History  Problem Relation Age of Onset  . Heart disease Father   . Heart attack Sister        Died in her 80s  . CAD Brother   . Heart attack Brother   . CAD Brother   . Heart attack Brother     Social History Social History   Tobacco Use  . Smoking status: Current Every Day Smoker    Packs/day: 3.00    Years: 60.00    Pack years: 180.00    Types: Cigarettes    Last attempt to quit: 02/11/2015    Years since quitting: 2.9  . Smokeless tobacco: Never Used  Substance Use Topics  . Alcohol use: Yes    Alcohol/week: 0.6 oz    Types: 1 Cans of beer per week    Comment: one beer a week  . Drug use: No     Allergies   Patient has no known allergies.   Review of Systems Review of Systems  Constitutional: Positive for fatigue and malaise/fatigue. Negative for chills, diaphoresis and fever.  HENT: Negative for congestion.   Eyes: Negative for visual disturbance.  Respiratory: Positive for chest tightness. Negative for cough, sputum production, shortness of breath, wheezing and stridor.   Cardiovascular: Positive for chest pain. Negative for palpitations, leg swelling and syncope.  Gastrointestinal: Positive for nausea. Negative for abdominal pain, constipation, diarrhea and vomiting.  Genitourinary: Negative for dysuria.  Musculoskeletal: Negative for back pain, neck pain and neck stiffness.  Neurological: Negative for light-headedness and headaches.  Psychiatric/Behavioral: Negative for agitation.  All other systems  reviewed and are negative.    Physical Exam Updated Vital Signs BP (!) 141/73   Pulse 63   Temp 98.6 F (37 C) (Oral)   Resp 12   Ht 5\' 6"  (1.676 m)   Wt 64 kg (141 lb)   SpO2 100%   BMI 22.76 kg/m   Physical Exam  Constitutional: He is oriented to person, place, and time. He appears well-developed and well-nourished. No distress.  HENT:  Head: Normocephalic.  Nose: Nose normal.  Mouth/Throat: Oropharynx is clear and moist. No oropharyngeal exudate.  Eyes: Pupils are equal, round, and reactive to light. Conjunctivae and EOM are normal.  Neck: Normal range of motion.  Cardiovascular: Normal rate and intact distal pulses.  No murmur heard. Pulmonary/Chest: Effort normal and breath sounds normal. No stridor. No respiratory distress. He has no wheezes. He has no rales. He exhibits no tenderness.  Abdominal: Soft. Bowel sounds are normal. He exhibits no distension. There is no tenderness.  Musculoskeletal: Normal range of motion. He exhibits no edema or tenderness.  Neurological: He is alert and oriented to person, place, and time. No sensory deficit. He exhibits normal muscle tone.  Skin: Capillary refill takes less than 2 seconds. He is not diaphoretic. No erythema. No pallor.  Psychiatric: He has a normal mood and affect.  Nursing note and vitals reviewed.    ED Treatments / Results  Labs (all labs ordered are listed, but only abnormal results are displayed) Labs Reviewed  CBC - Abnormal; Notable for the following components:      Result Value   RBC 4.03 (*)    MCV 102.0 (*)    MCH 35.0 (*)    All other components within normal limits  BASIC METABOLIC PANEL  I-STAT TROPONIN, ED  I-STAT TROPONIN, ED    EKG EKG Interpretation  Date/Time:  Thursday January 15 2018 11:02:35 EDT Ventricular Rate:  87 PR Interval:  148 QRS Duration: 90 QT Interval:  368 QTC Calculation: 442 R Axis:   90 Text Interpretation:  Normal sinus rhythm Rightward axis Anteroseptal infarct ,  age undetermined Abnormal ECG When compared to prior, no significant changes seen.  No STEMI Confirmed by Theda Belfast ([id number]) on 01/15/2018 11:59:01 AM   Radiology Dg Chest 2 View  Result Date: 01/15/2018 CLINICAL DATA:  Left and midline chest pain. Left jaw and arm pain. Shortness of breath, nausea. EXAM: CHEST - 2 VIEW COMPARISON:  03/23/2017 FINDINGS: Calcified granuloma in the right mid lung. Prior median sternotomy. Heart is normal size. No confluent acute airspace opacities or effusions. There is hyperinflation of the lungs compatible with COPD. IMPRESSION: COPD.  No active disease. Electronically Signed   By: Charlett Nose M.D.   On: 01/15/2018 11:34    Procedures Procedures (including critical care time)  Medications Ordered in ED Medications  nitroGLYCERIN (NITROSTAT) SL tablet 0.4 mg (has no administration in time range)  aspirin chewable tablet 243 mg (243 mg Oral Given 01/15/18 1324)     Initial Impression / Assessment and Plan / ED Course  I have reviewed the triage vital signs and the nursing notes.  Pertinent labs & imaging results that were available during my care of the patient were reviewed by me and considered in my medical decision making (see chart for details).     HUSSIEN GREENBLATT is a 67 y.o. male with a past medical history significant for CAD with CABG x2, AAA, CHF, hypertension, hyperlipidemia, back obese, COPD, and anxiety who presents with chest pain.  Patient reports that for the last 4 days he has been having intermittent chest pain.  He describes it as a chest pressure that radiates to his jaw and both of his arms.  He feels that it is "the same as my last heart attack".  He denies any shortness of breath, diaphoresis or vomiting but does report some nausea.  He reports taking his 81 mg of aspirin.  He says that it feels like there is a weight on his chest causing his symptoms.  He denies any recent injuries, leg pain, or leg swelling.  On exam, chest is  nontender.  Lungs are clear.  Patient had no significant crackles in his lungs.  Legs were nontender and nonswollen.  Patient was alert and oriented and had no other abnormal is on exam.  EKG appeared unchanged from prior.  No STEMI seen.    Given  patient's report that his pain is the same as prior MI that required CABG, patient was given the rest of his aspirin and cardiology was called in consultation.  Initial troponin was negative.  CBC showed no anemia or leukocytosis.  Metabolic panel reassuring.  Chest x-ray showed COPD with no acute changes.  Cardiology is requesting patient have a second troponin.  If second troponin is negative, they will likely recommend patient being discharged with plans to restart his home Coreg 3.25 twice daily and atorvastatin 40 as well as continue his aspirin.  They will also set up an outpatient stress test for him with any plan.    Anticipate reassessment after next troponin.  Care transferred to Dr. Dalene SeltzerSchlossman while awaiting delta trop, however, if patient's pain is improved and troponin is negative, patient agrees with discharge plan.  Patient understands risks of major adverse cardiac event including death if he leaves.  Patient reports he will follow-up as directed including getting a stress test as directed by cardiology.  Care transferred in stable condition.   Final Clinical Impressions(s) / ED Diagnoses   Final diagnoses:  Chest pain, unspecified type     Clinical Impression: 1. Chest pain, unspecified type     Disposition: Care transferred to Dr. Dalene SeltzerSchlossman while awaiting reassessment, delta troponin, and likely discharge.     Tegeler, Canary Brimhristopher J, MD 01/15/18 [phone]

## 2018-01-15 NOTE — ED Notes (Signed)
Pt care assumed, verbal report obtained.  Pt is resting and appears comfortable.  He denies any cp./SOB/dizziness.

## 2018-01-15 NOTE — ED Notes (Signed)
Patient reports chest pain that jaw pain X2-3 days

## 2018-01-19 DIAGNOSIS — Z6821 Body mass index (BMI) 21.0-21.9, adult: Secondary | ICD-10-CM | POA: Diagnosis not present

## 2018-01-19 DIAGNOSIS — B9789 Other viral agents as the cause of diseases classified elsewhere: Secondary | ICD-10-CM | POA: Diagnosis not present

## 2018-01-20 ENCOUNTER — Other Ambulatory Visit: Payer: Self-pay | Admitting: *Deleted

## 2018-01-20 DIAGNOSIS — R079 Chest pain, unspecified: Secondary | ICD-10-CM

## 2018-01-21 ENCOUNTER — Ambulatory Visit (HOSPITAL_COMMUNITY): Payer: Medicare Other

## 2018-01-21 ENCOUNTER — Encounter (HOSPITAL_COMMUNITY): Payer: Medicare Other

## 2018-01-28 ENCOUNTER — Encounter (HOSPITAL_COMMUNITY): Payer: Medicare Other

## 2018-01-28 NOTE — Progress Notes (Deleted)
Cardiology Office Note    Date:  01/28/2018   ID:  [name], DOB November 10, 1950, MRN [id number]  PCP:  [facility]  Cardiologist: Prentice Docker, MD    No chief complaint on file.   History of Present Illness:    [name] is a 67 y.o. male with past medical history of CAD (s/p CABG in 01/2015 with LIMA-LAD and SVG-RCA, cath in 10/2016 showing patent grafts with severe native CAD), Tricuspid Valve Repair (occurring in 2016), chronic combined systolic and diastolic CHF (EF 78% by echo in 10/2016), AAA, HTN, HLD, COPD, and continued tobacco use who presents to the office today for follow-up from a recent Emergency Department visit.   He was recently evaluated at [facility] ED on 01/15/2018 for new onset chest pain. Reported having episodes of chest pain over the past few weeks which radiated into his left arm and jaw. Was not taking all of his medications regularly and having discontinued his Amiodarone, Statin, Carvedilol, and Plavix.  Was still on ASA 81 mg daily. Initial and delta troponin values remain negative and his EKG showed no acute ischemic changes. Options were reviewed including admission for ACS rule out versus discharge home and outpatient stress testing and he wished to pursue the latter.  Therefore, an outpatient stress test was arranged for 01/28/2018 but he did not show up for the test.     Past Medical History:  Diagnosis Date  . AAA (abdominal aortic aneurysm) (HCC)   . Anxiety   . Arthritis   . COPD (chronic obstructive pulmonary disease) (HCC)   . 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 MI (myocardial infarction) (HCC)   . Pulmonary nodule    benign    Past Surgical History:  Procedure Laterality Date  . CARDIAC CATHETERIZATION  01/2015   Coronary angiography on 02/17/15 demonstrated the left main  coronary artery to have mild 30-40% stenosis, 100% LAD stenosis with right to left collaterals and faint left to left collaterals, small nondominant left circumflex, small to medium caliber first obtuse marginal branch with 20% ostial and proximal stenosis. The RCA was noted to have diffuse disease.  Marland Kitchen CARDIAC CATHETERIZATION N/A 11/11/2016   Procedure: Left Heart Cath and Cors/Grafts Angiography;  Surgeon: Marykay Lex, MD;  Location: [facility] INVASIVE CV LAB;  Service: Cardiovascular;  Laterality: N/A;  . CORONARY ARTERY BYPASS GRAFT  01/2015  . INGUINAL HERNIA REPAIR Bilateral   . VALVE REPLACEMENT  01/2015   Tricuspid Valve Replacement    Current Medications: Outpatient Medications Prior to Visit  Medication Sig Dispense Refill  . ALPRAZolam (XANAX) 1 MG tablet Take 1 mg by mouth 4 (four) times daily as needed for anxiety.   2  . aspirin EC 81 MG tablet Take 81 mg by mouth daily.     Marland Kitchen co-enzyme Q-10 30 MG capsule Take 1 capsule (30 mg total) by mouth 3 (three) times daily. (Patient not taking: Reported on 01/15/2018) 30 capsule 5  . famotidine (PEPCID) 20 MG tablet Take 1 tablet (20 mg total) by mouth 2 (two) times daily. (Patient not taking: Reported on 01/15/2018) 60 tablet 2  . HYDROcodone-acetaminophen (NORCO) 7.5-325 MG tablet Take 1 tablet by mouth every 6 (six) hours as needed for moderate pain.   0  . nitroGLYCERIN (NITROSTAT) 0.4 MG SL tablet Place 1 tablet (0.4  mg total) under the tongue every 5 (five) minutes as needed for chest pain. 90 tablet 3  . Omega-3 Fatty Acids (FISH OIL) 1000 MG CAPS Take 2 capsules (2,000 mg total) by mouth 2 (two) times daily. (Patient not taking: Reported on 01/15/2018) 100 capsule 5  . pravastatin (PRAVACHOL) 20 MG tablet Take 1 tablet (20 mg total) by mouth daily. (Patient not taking: Reported on 01/15/2018) 30 tablet 3  . traMADol (ULTRAM) 50 MG tablet Take 1 tablet (50 mg total) by mouth every 6 (six) hours as needed. (Patient not taking: Reported on  01/15/2018) 12 tablet 0   No facility-administered medications prior to visit.      Allergies:   Patient has no known allergies.   Social History   Socioeconomic History  . Marital status: Divorced    Spouse name: Not on file  . Number of children: Not on file  . Years of education: Not on file  . Highest education level: Not on file  Occupational History  . Not on file  Social Needs  . Financial resource strain: Not on file  . Food insecurity:    Worry: Not on file    Inability: Not on file  . Transportation needs:    Medical: Not on file    Non-medical: Not on file  Tobacco Use  . Smoking status: Current Every Day Smoker    Packs/day: 3.00    Years: 60.00    Pack years: 180.00    Types: Cigarettes    Last attempt to quit: 02/11/2015    Years since quitting: 2.9  . Smokeless tobacco: Never Used  Substance and Sexual Activity  . Alcohol use: Yes    Alcohol/week: 0.6 oz    Types: 1 Cans of beer per week    Comment: one beer a week  . Drug use: No  . Sexual activity: Not on file  Lifestyle  . Physical activity:    Days per week: Not on file    Minutes per session: Not on file  . Stress: Not on file  Relationships  . Social connections:    Talks on phone: Not on file    Gets together: Not on file    Attends religious service: Not on file    Active member of club or organization: Not on file    Attends meetings of clubs or organizations: Not on file    Relationship status: Not on file  Other Topics Concern  . Not on file  Social History Narrative  . Not on file     Family History:  The patient's ***family history includes CAD in his brother and brother; Heart attack in his brother, brother, and sister; Heart disease in his father.   Review of Systems:   Please see the history of present illness.     General:  No chills, fever, night sweats or weight changes.  Cardiovascular:  No chest pain, dyspnea on exertion, edema, orthopnea, palpitations, paroxysmal  nocturnal dyspnea. Dermatological: No rash, lesions/masses Respiratory: No cough, dyspnea Urologic: No hematuria, dysuria Abdominal:   No nausea, vomiting, diarrhea, bright red blood per rectum, melena, or hematemesis Neurologic:  No visual changes, wkns, changes in mental status. All other systems reviewed and are otherwise negative except as noted above.   Physical Exam:    VS:  There were no vitals taken for this visit.   General: Well developed, well nourished,male appearing in no acute distress. Head: Normocephalic, atraumatic, sclera non-icteric, no xanthomas, nares are without discharge.  Neck: No carotid bruits. JVD not elevated.  Lungs: Respirations regular and unlabored, without wheezes or rales.  Heart: ***Regular rate and rhythm. No S3 or S4.  No murmur, no rubs, or gallops appreciated. Abdomen: Soft, non-tender, non-distended with normoactive bowel sounds. No hepatomegaly. No rebound/guarding. No obvious abdominal masses. Msk:  Strength and tone appear normal for age. No joint deformities or effusions. Extremities: No clubbing or cyanosis. No edema.  Distal pedal pulses are 2+ bilaterally. Neuro: Alert and oriented X 3. Moves all extremities spontaneously. No focal deficits noted. Psych:  Responds to questions appropriately with a normal affect. Skin: No rashes or lesions noted  Wt Readings from Last 3 Encounters:  01/15/18 141 lb (64 kg)  12/26/17 141 lb (64 kg)  07/15/17 131 lb (59.4 kg)        Studies/Labs Reviewed:   EKG:  EKG is*** ordered today.  The ekg ordered today demonstrates ***  Recent Labs: 05/23/2017: Magnesium 2.2 01/15/2018: BUN 9; Creatinine, Ser 0.94; Hemoglobin 14.1; Platelets 195; Potassium 4.3; Sodium 138   Lipid Panel No results found for: CHOL, TRIG, HDL, CHOLHDL, VLDL, LDLCALC, LDLDIRECT  Additional studies/ records that were reviewed today include:   Echocardiogram: 10/2016 Study Conclusions  - Left ventricle: The cavity size was  normal. Wall thickness was   increased in a pattern of mild LVH. Systolic function was mildly   reduced. The estimated ejection fraction was 45%. - Regional wall motion abnormality: Moderate hypokinesis of the   mid-apical anterior, basal anteroseptal, basal-mid inferoseptal,   and apical septal myocardium; mild hypokinesis of the mid   anteroseptal and apical myocardium. - Aortic valve: Mildly thickened, moderately calcified leaflets.   Morphologically, there may be very mild aortic stenosis. - Mitral valve: Mildly calcified annulus. Normal thickness leaflets   . There was mild regurgitation. - Left atrium: The atrium was mildly dilated. - Right ventricle: Systolic function was mildly reduced. 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 There was trivial   regurgitation. - Pulmonary arteries: PA peak pressure: 35 mm Hg (S). - Systemic veins: Dilated IVC with normal respiratory variation.   Estimated CVP 8 mmHg.  Cardiac Catheterization: 11/11/2016  Ost LAD to Prox LAD lesion, 100 %stenosed. Short reconstitution from LIMA with Prox LAD to Mid LAD lesion, 95 %stenosed.  LIMA-mLAD and is normal in caliber and anatomically normal. Dist-apical LAD lesion, 60 %stenosed.  Prox RCA to Dist RCA lesion, 100 %stenosed.  Seq SVG- dRCA and is large and anatomically normal - fills small rPDA & RPAV with 2 RPLs  Patent non-dominant Circumflex with minimal CAD.  _________________________________________  There is mild left ventricular systolic dysfunction. The left ventricular ejection fraction is 45-50% by visual estimate.  LV end diastolic pressure is mildly elevated.    Widely patent grafts and native circumflex with known occlusion of the LAD and now occluded RCA. No obvious culprit lesion to explain the patient's angina besides the short segment of the proximal LAD isolated by proximal occlusion and distal 95% stenosis. No PCI  targets.  Plan: Return to short stay for TR band removal Discharge following bed rest.  Recommendations:  Optimize medical management with antianginal therapy.  Smoking cessation counseling  Assessment:    No diagnosis found.   Plan:   In order of problems listed above:  1. ***    Medication Adjustments/Labs and Tests Ordered: Current medicines are reviewed at length with the patient today.  Concerns regarding medicines  are outlined above.  Medication changes, Labs and Tests ordered today are listed in the Patient Instructions below. There are no Patient Instructions on file for this visit.   Signed, Sean LennoxBrittany M Christabella Alvira, PA-C  01/28/2018 12:47 PM    [facility] [address] [place], [place] [address] Phone: [phone]

## 2018-01-29 ENCOUNTER — Ambulatory Visit: Payer: Medicare Other | Admitting: Student

## 2018-01-29 DIAGNOSIS — R0989 Other specified symptoms and signs involving the circulatory and respiratory systems: Secondary | ICD-10-CM

## 2018-01-30 DIAGNOSIS — Z6821 Body mass index (BMI) 21.0-21.9, adult: Secondary | ICD-10-CM | POA: Diagnosis not present

## 2018-01-30 DIAGNOSIS — G894 Chronic pain syndrome: Secondary | ICD-10-CM | POA: Diagnosis not present

## 2018-02-10 ENCOUNTER — Other Ambulatory Visit: Payer: Self-pay | Admitting: Adult Health

## 2018-03-04 DIAGNOSIS — Z125 Encounter for screening for malignant neoplasm of prostate: Secondary | ICD-10-CM | POA: Diagnosis not present

## 2018-03-04 DIAGNOSIS — I714 Abdominal aortic aneurysm, without rupture: Secondary | ICD-10-CM | POA: Diagnosis not present

## 2018-03-04 DIAGNOSIS — I2510 Atherosclerotic heart disease of native coronary artery without angina pectoris: Secondary | ICD-10-CM | POA: Diagnosis not present

## 2018-03-04 DIAGNOSIS — G894 Chronic pain syndrome: Secondary | ICD-10-CM | POA: Diagnosis not present

## 2018-03-04 DIAGNOSIS — Z719 Counseling, unspecified: Secondary | ICD-10-CM | POA: Diagnosis not present

## 2018-03-04 DIAGNOSIS — F419 Anxiety disorder, unspecified: Secondary | ICD-10-CM | POA: Diagnosis not present

## 2018-03-04 DIAGNOSIS — E782 Mixed hyperlipidemia: Secondary | ICD-10-CM | POA: Diagnosis not present

## 2018-03-04 DIAGNOSIS — Z6821 Body mass index (BMI) 21.0-21.9, adult: Secondary | ICD-10-CM | POA: Diagnosis not present

## 2018-03-04 DIAGNOSIS — Z9889 Other specified postprocedural states: Secondary | ICD-10-CM | POA: Diagnosis not present

## 2018-03-04 DIAGNOSIS — Z0001 Encounter for general adult medical examination with abnormal findings: Secondary | ICD-10-CM | POA: Diagnosis not present

## 2018-04-03 DIAGNOSIS — G4700 Insomnia, unspecified: Secondary | ICD-10-CM | POA: Diagnosis not present

## 2018-04-03 DIAGNOSIS — G894 Chronic pain syndrome: Secondary | ICD-10-CM | POA: Diagnosis not present

## 2018-04-03 DIAGNOSIS — Z1389 Encounter for screening for other disorder: Secondary | ICD-10-CM | POA: Diagnosis not present

## 2018-04-03 DIAGNOSIS — Z6820 Body mass index (BMI) 20.0-20.9, adult: Secondary | ICD-10-CM | POA: Diagnosis not present

## 2018-05-04 DIAGNOSIS — F419 Anxiety disorder, unspecified: Secondary | ICD-10-CM | POA: Diagnosis not present

## 2018-05-04 DIAGNOSIS — G894 Chronic pain syndrome: Secondary | ICD-10-CM | POA: Diagnosis not present

## 2018-05-04 DIAGNOSIS — Z681 Body mass index (BMI) 19 or less, adult: Secondary | ICD-10-CM | POA: Diagnosis not present

## 2018-07-27 DIAGNOSIS — F419 Anxiety disorder, unspecified: Secondary | ICD-10-CM | POA: Diagnosis not present

## 2018-07-27 DIAGNOSIS — Z681 Body mass index (BMI) 19 or less, adult: Secondary | ICD-10-CM | POA: Diagnosis not present

## 2018-07-27 DIAGNOSIS — Z1389 Encounter for screening for other disorder: Secondary | ICD-10-CM | POA: Diagnosis not present

## 2018-07-27 DIAGNOSIS — Z0001 Encounter for general adult medical examination with abnormal findings: Secondary | ICD-10-CM | POA: Diagnosis not present

## 2018-07-27 DIAGNOSIS — G894 Chronic pain syndrome: Secondary | ICD-10-CM | POA: Diagnosis not present

## 2018-08-13 ENCOUNTER — Emergency Department (HOSPITAL_COMMUNITY): Payer: Medicare Other

## 2018-08-13 ENCOUNTER — Other Ambulatory Visit: Payer: Self-pay

## 2018-08-13 ENCOUNTER — Observation Stay (HOSPITAL_COMMUNITY)
Admission: EM | Admit: 2018-08-13 | Discharge: 2018-08-14 | Disposition: A | Discharge status: Home / Self Care | Payer: Medicare Other | Attending: Family Medicine | Admitting: Family Medicine

## 2018-08-13 ENCOUNTER — Encounter (HOSPITAL_COMMUNITY): Payer: Self-pay

## 2018-08-13 DIAGNOSIS — M1990 Unspecified osteoarthritis, unspecified site: Secondary | ICD-10-CM | POA: Insufficient documentation

## 2018-08-13 DIAGNOSIS — R110 Nausea: Secondary | ICD-10-CM | POA: Insufficient documentation

## 2018-08-13 DIAGNOSIS — I25119 Atherosclerotic heart disease of native coronary artery with unspecified angina pectoris: Secondary | ICD-10-CM | POA: Diagnosis not present

## 2018-08-13 DIAGNOSIS — Z952 Presence of prosthetic heart valve: Secondary | ICD-10-CM | POA: Diagnosis not present

## 2018-08-13 DIAGNOSIS — Z7982 Long term (current) use of aspirin: Secondary | ICD-10-CM | POA: Insufficient documentation

## 2018-08-13 DIAGNOSIS — Z8249 Family history of ischemic heart disease and other diseases of the circulatory system: Secondary | ICD-10-CM | POA: Insufficient documentation

## 2018-08-13 DIAGNOSIS — E7800 Pure hypercholesterolemia, unspecified: Secondary | ICD-10-CM | POA: Diagnosis not present

## 2018-08-13 DIAGNOSIS — I080 Rheumatic disorders of both mitral and aortic valves: Secondary | ICD-10-CM | POA: Diagnosis not present

## 2018-08-13 DIAGNOSIS — Z79899 Other long term (current) drug therapy: Secondary | ICD-10-CM | POA: Insufficient documentation

## 2018-08-13 DIAGNOSIS — I959 Hypotension, unspecified: Secondary | ICD-10-CM | POA: Insufficient documentation

## 2018-08-13 DIAGNOSIS — I714 Abdominal aortic aneurysm, without rupture, unspecified: Secondary | ICD-10-CM | POA: Diagnosis present

## 2018-08-13 DIAGNOSIS — F411 Generalized anxiety disorder: Secondary | ICD-10-CM | POA: Diagnosis not present

## 2018-08-13 DIAGNOSIS — Z951 Presence of aortocoronary bypass graft: Secondary | ICD-10-CM | POA: Insufficient documentation

## 2018-08-13 DIAGNOSIS — F17210 Nicotine dependence, cigarettes, uncomplicated: Secondary | ICD-10-CM | POA: Insufficient documentation

## 2018-08-13 DIAGNOSIS — I513 Intracardiac thrombosis, not elsewhere classified: Secondary | ICD-10-CM | POA: Insufficient documentation

## 2018-08-13 DIAGNOSIS — I25110 Atherosclerotic heart disease of native coronary artery with unstable angina pectoris: Secondary | ICD-10-CM | POA: Diagnosis not present

## 2018-08-13 DIAGNOSIS — I5042 Chronic combined systolic (congestive) and diastolic (congestive) heart failure: Secondary | ICD-10-CM | POA: Diagnosis not present

## 2018-08-13 DIAGNOSIS — R0789 Other chest pain: Secondary | ICD-10-CM | POA: Insufficient documentation

## 2018-08-13 DIAGNOSIS — R911 Solitary pulmonary nodule: Secondary | ICD-10-CM | POA: Diagnosis not present

## 2018-08-13 DIAGNOSIS — R079 Chest pain, unspecified: Secondary | ICD-10-CM

## 2018-08-13 DIAGNOSIS — I200 Unstable angina: Secondary | ICD-10-CM | POA: Diagnosis not present

## 2018-08-13 DIAGNOSIS — J449 Chronic obstructive pulmonary disease, unspecified: Secondary | ICD-10-CM | POA: Diagnosis present

## 2018-08-13 DIAGNOSIS — J439 Emphysema, unspecified: Secondary | ICD-10-CM | POA: Diagnosis not present

## 2018-08-13 DIAGNOSIS — K219 Gastro-esophageal reflux disease without esophagitis: Secondary | ICD-10-CM | POA: Insufficient documentation

## 2018-08-13 DIAGNOSIS — I110 Hypertensive heart disease with heart failure: Secondary | ICD-10-CM | POA: Diagnosis not present

## 2018-08-13 DIAGNOSIS — Z9114 Patient's other noncompliance with medication regimen: Secondary | ICD-10-CM | POA: Diagnosis not present

## 2018-08-13 DIAGNOSIS — I252 Old myocardial infarction: Secondary | ICD-10-CM | POA: Insufficient documentation

## 2018-08-13 LAB — CBC WITH DIFFERENTIAL/PLATELET
Abs Immature Granulocytes: 0.02 10*3/uL (ref 0.00–0.07)
BASOS ABS: 0 10*3/uL (ref 0.0–0.1)
BASOS PCT: 1 %
EOS ABS: 0.1 10*3/uL (ref 0.0–0.5)
EOS PCT: 1 %
HCT: 39.7 % (ref 39.0–52.0)
Hemoglobin: 12.9 g/dL — ABNORMAL LOW (ref 13.0–17.0)
IMMATURE GRANULOCYTES: 0 %
Lymphocytes Relative: 42 %
Lymphs Abs: 3.3 10*3/uL (ref 0.7–4.0)
MCH: 34.5 pg — ABNORMAL HIGH (ref 26.0–34.0)
MCHC: 32.5 g/dL (ref 30.0–36.0)
MCV: 106.1 fL — AB (ref 80.0–100.0)
Monocytes Absolute: 0.8 10*3/uL (ref 0.1–1.0)
Monocytes Relative: 9 %
NRBC: 0 % (ref 0.0–0.2)
Neutro Abs: 3.7 10*3/uL (ref 1.7–7.7)
Neutrophils Relative %: 47 %
PLATELETS: 167 10*3/uL (ref 150–400)
RBC: 3.74 MIL/uL — ABNORMAL LOW (ref 4.22–5.81)
RDW: 13.2 % (ref 11.5–15.5)
WBC: 8 10*3/uL (ref 4.0–10.5)

## 2018-08-13 LAB — COMPREHENSIVE METABOLIC PANEL
ALK PHOS: 60 U/L (ref 38–126)
ALT: 10 U/L (ref 0–44)
ANION GAP: 5 (ref 5–15)
AST: 21 U/L (ref 15–41)
Albumin: 3.6 g/dL (ref 3.5–5.0)
BILIRUBIN TOTAL: 0.6 mg/dL (ref 0.3–1.2)
BUN: 17 mg/dL (ref 8–23)
CALCIUM: 8.4 mg/dL — AB (ref 8.9–10.3)
CO2: 28 mmol/L (ref 22–32)
Chloride: 104 mmol/L (ref 98–111)
Creatinine, Ser: 1.02 mg/dL (ref 0.61–1.24)
Glucose, Bld: 103 mg/dL — ABNORMAL HIGH (ref 70–99)
Potassium: 4.4 mmol/L (ref 3.5–5.1)
Sodium: 137 mmol/L (ref 135–145)
TOTAL PROTEIN: 6.5 g/dL (ref 6.5–8.1)

## 2018-08-13 LAB — TROPONIN I: Troponin I: <0.03 ng/mL (ref <0.03)

## 2018-08-13 LAB — LIPASE, BLOOD: LIPASE: 25 U/L (ref 11–51)

## 2018-08-13 IMAGING — CT CT ANGIO CHEST-ABD-PELV FOR DISSECTION W/ AND WO/W CM
2 of 7 series · 14 of 46 positions shown, 16 images · IV contrast (Isovue)
Comparison: [DATE]

CLINICAL DATA: Several day history of chest pain.

EXAM:
CT ANGIOGRAPHY CHEST, ABDOMEN AND PELVIS
TECHNIQUE: Multidetector CT imaging through the chest, abdomen and pelvis was
performed using the standard protocol during bolus administration of
intravenous contrast. Multiplanar reconstructed images and MIPs were
obtained and reviewed to evaluate the vascular anatomy.
CONTRAST:  100mL [ER] IOPAMIDOL ([ER]) INJECTION 76%

[Series 5: dissection axial arterial · axial · arterial · 0.69mm/px · z∈[+1046,+1592]mm · 11 of 210 slices shown, 13 images]
[im 14/210  soft-tissue]
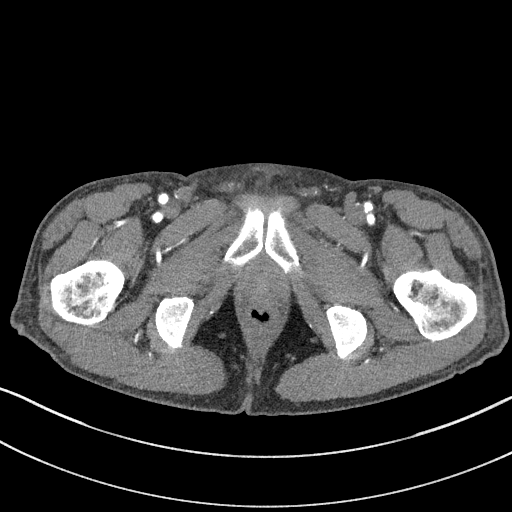
[im 14/210  bone]
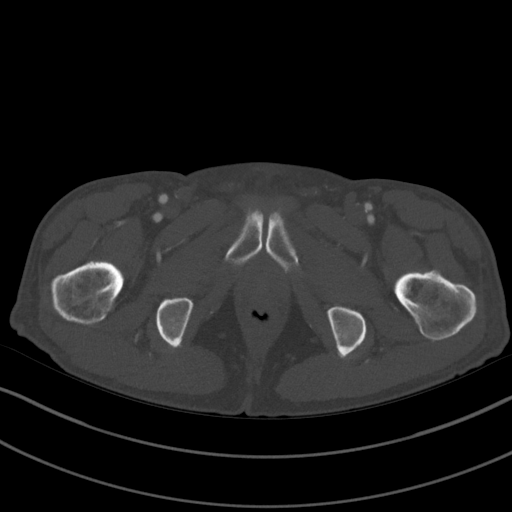
[im 28/210  soft-tissue]
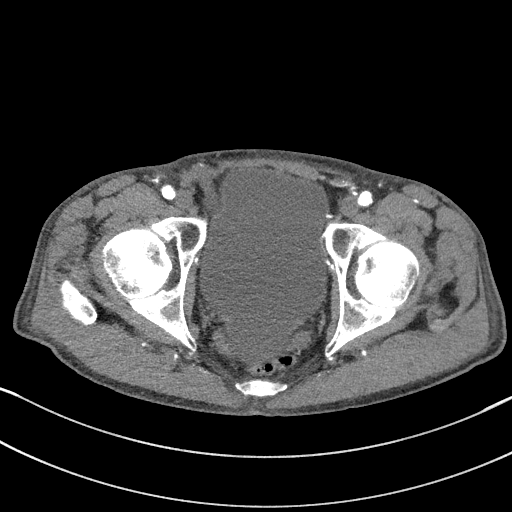
[im 56/210  soft-tissue]
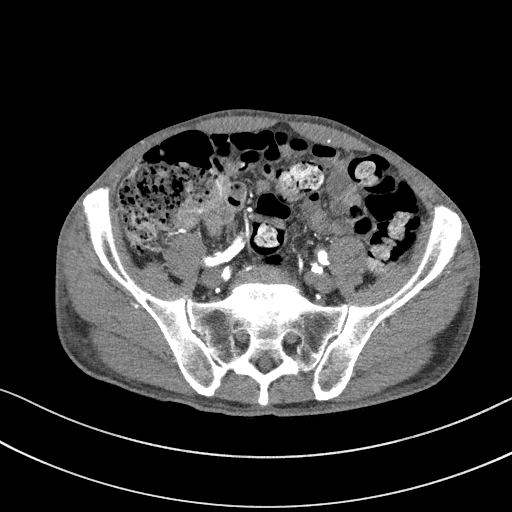
[im 70/210  soft-tissue]
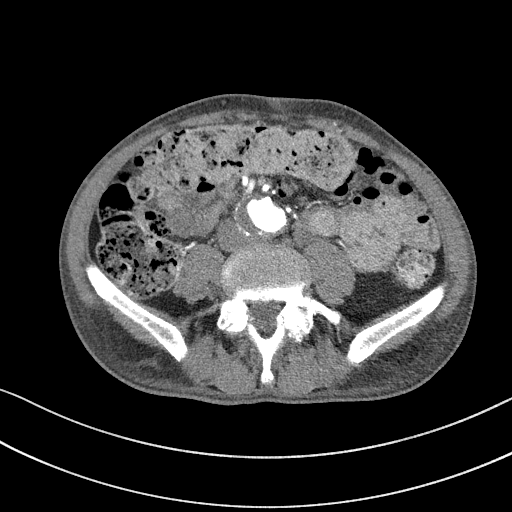
[im 84/210  soft-tissue]
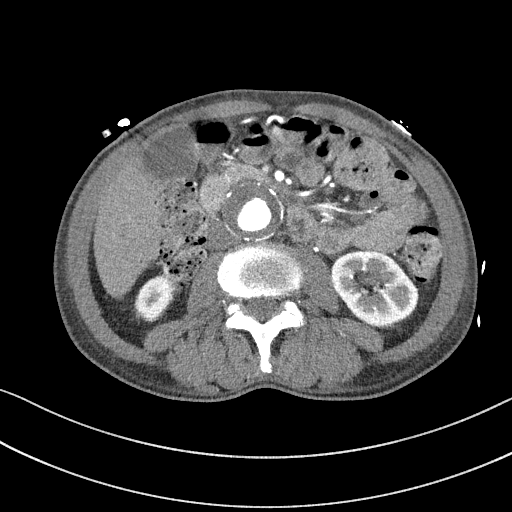
[im 112/210  soft-tissue]
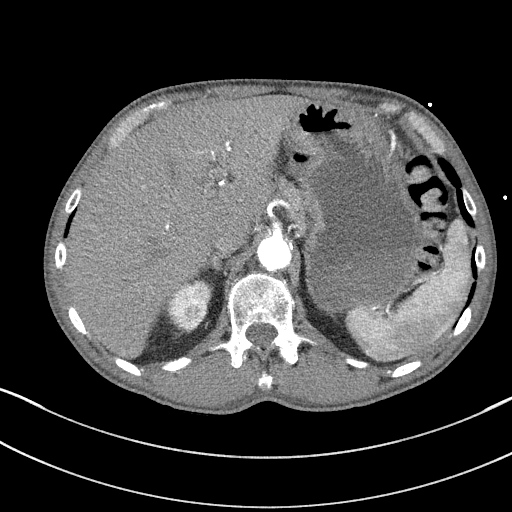
[im 126/210  soft-tissue]
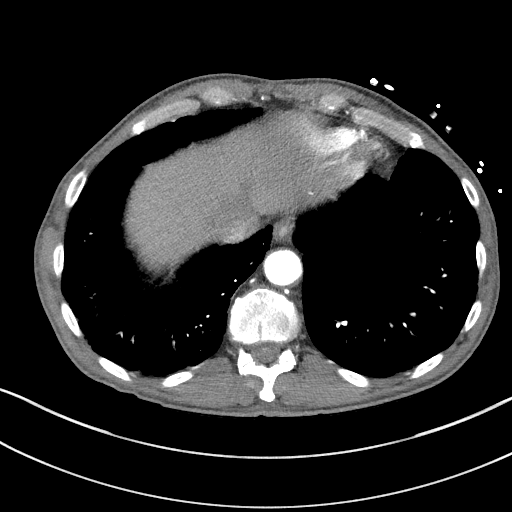
[im 140/210  soft-tissue]
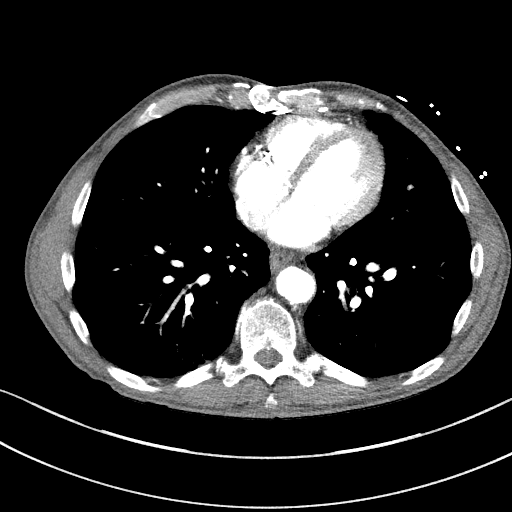
[im 154/210  soft-tissue]
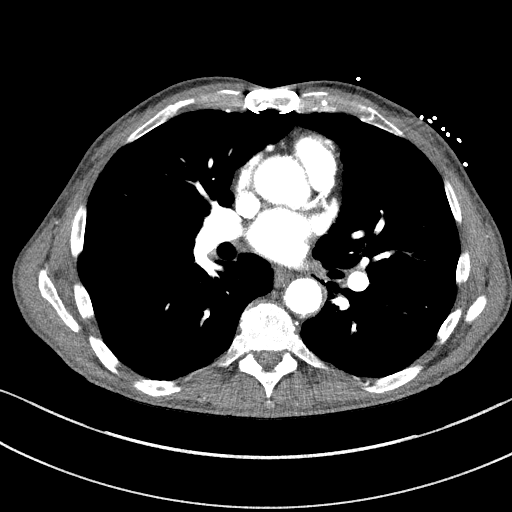
[im 154/210  bone]
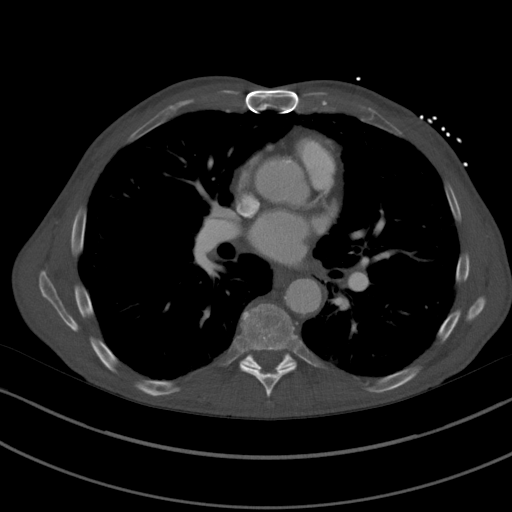
[im 182/210  soft-tissue]
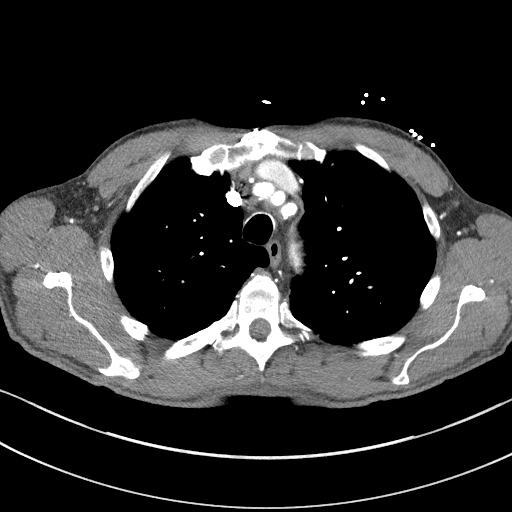
[im 196/210  soft-tissue]
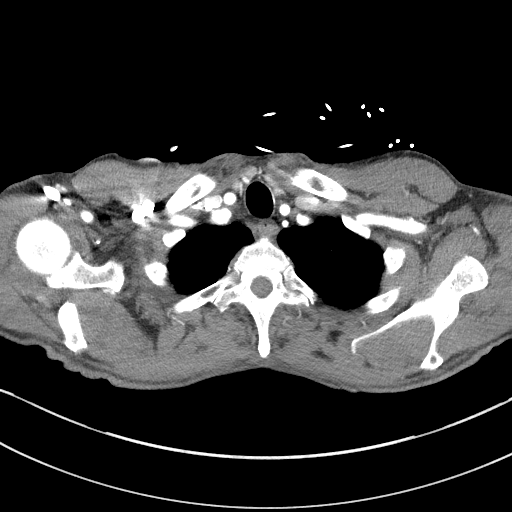

[Series 9: coronals · coronal · 0.71mm/px · 3 of 125 slices shown]
[im 32/125  soft-tissue]
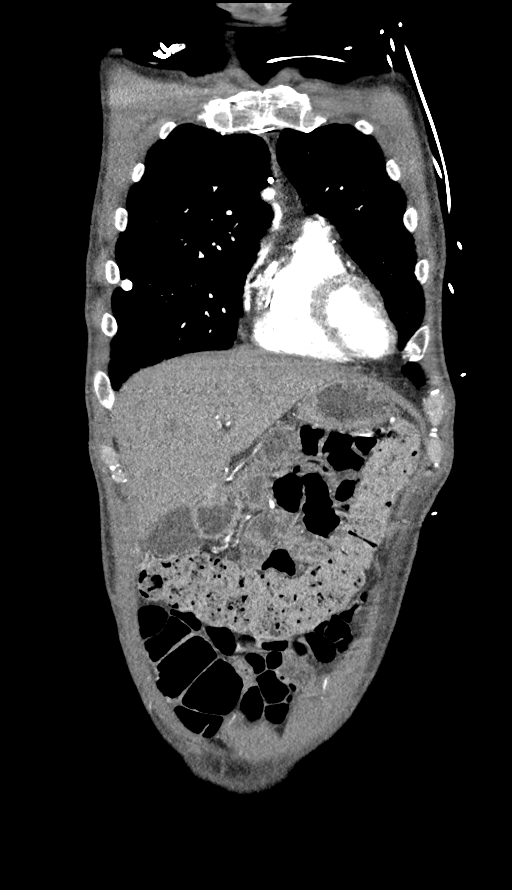
[im 63/125  soft-tissue]
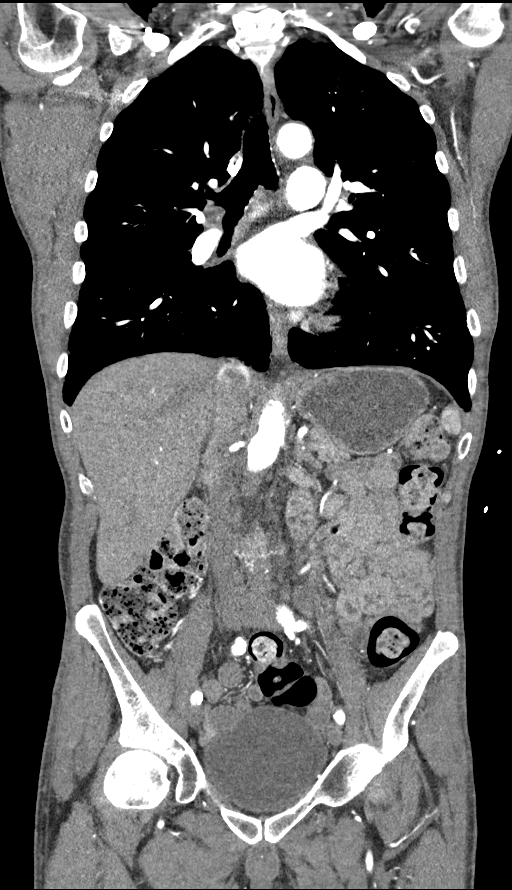
[im 94/125  soft-tissue]
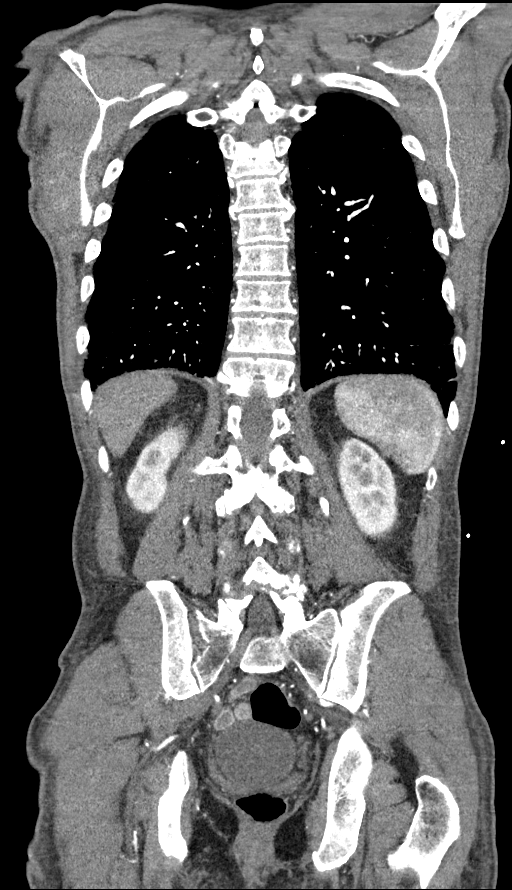

[14 of 46 positions shown; findings below may reference images not displayed]

FINDINGS: CTA CHEST FINDINGS

Cardiovascular: The heart is normal in size. No pericardial
effusion. The aorta is normal in caliber. No dissection. The branch
vessels are patent. Scattered atherosclerotic calcifications.
Three-vessel coronary artery calcifications and surgical changes
from bypass surgery.

Mediastinum/Nodes: Stable mediastinal and hilar lymph nodes. No new
or progressive findings. The esophagus is grossly normal.

Lungs/Pleura: Severe emphysema and pulmonary scarring. Enlarging
superior segment right lower lobe pulmonary nodule adjacent to the
major fissure. It measures 11 x 11 mm and previously measured 9.5 x
9.5 mm.

Stable calcified granulomas in the right middle lobe.

Musculoskeletal: No significant bony findings.

Review of the MIP images confirms the above findings.

CTA ABDOMEN AND PELVIS FINDINGS

VASCULAR

Aorta: Infrarenal abdominal aortic aneurysm measuring a maximum of
4.1 cm. This is stable when compared to the prior CT scan. Extensive
mural thrombus. This extends right down to the iliac artery
bifurcation and there is mild aneurysmal dilatation of both common
iliac arteries and extensive atherosclerotic calcifications.

Celiac: Patent.

SMA: Patent.

Renals: Calcification along the proximal right renal artery but no
significant stenosis. Left renal artery is widely patent.

IMA: Not identified.

Inflow: Severe external and internal iliac artery atherosclerotic
calcifications.

Veins: Grossly normal

Review of the MIP images confirms the above findings.

NON-VASCULAR

Hepatobiliary: No significant findings.

Pancreas: No mass, inflammation or ductal dilatation.

Spleen: Normal size.  No focal lesions.

Adrenals/Urinary Tract: The adrenal glands and kidneys are grossly
normal.

Stomach/Bowel: The stomach, duodenum, small bowel and colon are
grossly normal without oral contrast. No inflammatory changes, mass
lesions or obstructive findings. The terminal ileum and appendix are
normal. Moderate stool throughout the colon.

Lymphatic: No overt adenopathy.

Reproductive: The prostate gland and seminal vesicles are grossly
normal.

Other: No ascites or abdominal wall hernia.

Musculoskeletal: Bilateral pars defects at L5.

Review of the MIP images confirms the above findings.
IMPRESSION: 1. Stable 4.1 cm infrarenal abdominal aortic aneurysm with extensive
mural thrombus.
2. No thoracic aneurysm or dissection.
3. Enlarging superior segment right lower lobe pulmonary nodule.
PET-CT may be helpful for further evaluation.
4. Severe emphysema and pulmonary scarring.

## 2018-08-13 MED ORDER — SODIUM CHLORIDE 0.9% FLUSH
3.0000 mL | Freq: Two times a day (BID) | INTRAVENOUS | Status: DC
  Administered 2018-08-14: 3 mL via INTRAVENOUS

## 2018-08-13 MED ORDER — ONDANSETRON HCL 4 MG/2ML IJ SOLN
4.0000 mg | Freq: Once | INTRAMUSCULAR | Status: AC
  Administered 2018-08-13: 4 mg via INTRAVENOUS
  Filled 2018-08-13: qty 2

## 2018-08-13 MED ORDER — NICOTINE 21 MG/24HR TD PT24
21.0000 mg | MEDICATED_PATCH | Freq: Once | TRANSDERMAL | Status: DC
  Administered 2018-08-13: 21 mg via TRANSDERMAL
  Filled 2018-08-13: qty 1

## 2018-08-13 MED ORDER — METOCLOPRAMIDE HCL 5 MG/ML IJ SOLN
5.0000 mg | Freq: Once | INTRAMUSCULAR | Status: AC
  Administered 2018-08-13: 5 mg via INTRAVENOUS
  Filled 2018-08-13: qty 2

## 2018-08-13 MED ORDER — ACETAMINOPHEN 650 MG RE SUPP: 650.0000 mg | Freq: Four times a day (QID) | RECTAL | Status: DC | PRN

## 2018-08-13 MED ORDER — PANTOPRAZOLE SODIUM 40 MG PO TBEC
40.0000 mg | DELAYED_RELEASE_TABLET | Freq: Every day | ORAL | Status: DC
  Administered 2018-08-14: 40 mg via ORAL
  Filled 2018-08-13: qty 1

## 2018-08-13 MED ORDER — SODIUM CHLORIDE 0.9% FLUSH: 3.0000 mL | INTRAVENOUS | Status: DC | PRN

## 2018-08-13 MED ORDER — PROMETHAZINE HCL 25 MG/ML IJ SOLN
12.5000 mg | Freq: Once | INTRAMUSCULAR | Status: AC
  Administered 2018-08-13: 12.5 mg via INTRAVENOUS
  Filled 2018-08-13: qty 1

## 2018-08-13 MED ORDER — ASPIRIN 81 MG PO CHEW
324.0000 mg | CHEWABLE_TABLET | Freq: Once | ORAL | Status: AC
  Administered 2018-08-13: 324 mg via ORAL
  Filled 2018-08-13: qty 4

## 2018-08-13 MED ORDER — NITROGLYCERIN IN D5W 200-5 MCG/ML-% IV SOLN
5.0000 ug/min | Freq: Once | INTRAVENOUS | Status: AC
  Administered 2018-08-13: 5 ug/min via INTRAVENOUS
  Filled 2018-08-13: qty 250

## 2018-08-13 MED ORDER — ENOXAPARIN SODIUM 40 MG/0.4ML ~~LOC~~ SOLN
40.0000 mg | SUBCUTANEOUS | Status: DC
  Administered 2018-08-14: 40 mg via SUBCUTANEOUS
  Filled 2018-08-13: qty 0.4

## 2018-08-13 MED ORDER — SODIUM CHLORIDE 0.9 % IV SOLN: 250.0000 mL | INTRAVENOUS | Status: DC | PRN

## 2018-08-13 MED ORDER — ONDANSETRON HCL 4 MG/2ML IJ SOLN
4.0000 mg | Freq: Four times a day (QID) | INTRAMUSCULAR | Status: DC | PRN
  Administered 2018-08-14: 4 mg via INTRAVENOUS
  Filled 2018-08-13: qty 2

## 2018-08-13 MED ORDER — IOPAMIDOL (ISOVUE-370) INJECTION 76%
100.0000 mL | Freq: Once | INTRAVENOUS | Status: AC | PRN
  Administered 2018-08-13: 100 mL via INTRAVENOUS

## 2018-08-13 MED ORDER — ACETAMINOPHEN 325 MG PO TABS: 650.0000 mg | ORAL_TABLET | Freq: Four times a day (QID) | ORAL | Status: DC | PRN

## 2018-08-13 NOTE — ED Notes (Signed)
ED Provider at bedside. 

## 2018-08-13 NOTE — ED Provider Notes (Signed)
[facility] EMERGENCY DEPARTMENT Provider Note   CSN: [id number] Arrival date & time: 08/13/18  1841     History   Chief Complaint Chief Complaint  Patient presents with  . Chest Pain    HPI [name] is a 67 y.o. male.  HPI Planes of anterior chest pain for the past 2 days intermittent lasting 2 hours at a time, approximately.  Symptoms accompanied by epigastric pain also lasting 2 hours at a time pain is not made better or worse by anything he is treated himself with sublingual nitroglycerin with partial relief.  Associated symptoms include nausea and vomiting.  No shortness of breath no sweatiness.  He states that pain feels somewhat like "heart pain" he said in the past Past Medical History:  Diagnosis Date  . AAA (abdominal aortic aneurysm) (HCC)   . Anxiety   . Arthritis   . COPD (chronic obstructive pulmonary disease) (HCC)   . 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 MI (myocardial infarction) (HCC)   . Pulmonary nodule    benign    Patient Active Problem List   Diagnosis Date Noted  . Elevated MCV 05/24/2017  . COPD (chronic obstructive pulmonary disease) (HCC) 05/23/2017  . Anxiety 05/23/2017  . Angina, class II (HCC) 11/11/2016  . Abnormal nuclear stress test 11/11/2016  . Chest pain 10/09/2016  . Coronary artery disease involving native heart with angina pectoris (HCC) 10/09/2016  . Tobacco abuse 10/09/2016  . Pure hypercholesterolemia 03/21/2015  . 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 AAA (abdominal aortic aneurysm) without rupture (HCC) 03/01/2014    Past Surgical History:  Procedure Laterality Date  . CARDIAC CATHETERIZATION  01/2015   Coronary angiography on 02/17/15 demonstrated the left main coronary artery to have mild 30-40% stenosis, 100% LAD stenosis with right to left collaterals and faint  left to left collaterals, small nondominant left circumflex, small to medium caliber first obtuse marginal branch with 20% ostial and proximal stenosis. The RCA was noted to have diffuse disease.  Marland Kitchen CARDIAC CATHETERIZATION N/A 11/11/2016   Procedure: Left Heart Cath and Cors/Grafts Angiography;  Surgeon: Marykay Lex, MD;  Location: [facility] INVASIVE CV LAB;  Service: Cardiovascular;  Laterality: N/A;  . CORONARY ARTERY BYPASS GRAFT  01/2015  . INGUINAL HERNIA REPAIR Bilateral   . VALVE REPLACEMENT  01/2015   Tricuspid Valve Replacement        Home Medications    Prior to Admission medications   Medication Sig Start Date End Date Taking? Authorizing Provider  ALPRAZolam Prudy Feeler) 1 MG tablet Take 1 mg by mouth 4 (four) times daily as needed for anxiety.  09/17/16   [provider]  aspirin EC 81 MG tablet Take 81 mg by mouth daily.     [provider]  co-enzyme Q-10 30 MG capsule Take 1 capsule (30 mg total) by mouth 3 (three) times daily. Patient not taking: Reported on 01/15/2018 05/24/17   Houston Siren, MD  famotidine (PEPCID) 20 MG tablet Take 1 tablet (20 mg total) by mouth 2 (two) times daily. Patient not taking: Reported on 01/15/2018 05/24/17   Houston Siren, MD  HYDROcodone-acetaminophen (NORCO) 7.5-325 MG tablet Take 1 tablet by mouth every 6 (six) hours as needed for moderate pain.  08/16/16   [provider]  nitroGLYCERIN (NITROSTAT) 0.4 MG SL tablet PLACE 1 TABLET UNDER  THE TONGUE EVERY 5 MINUTES AS NEEDED FOR CHEST PAIN 02/11/18   Jodelle Gross, NP  Omega-3 Fatty Acids (FISH OIL) 1000 MG CAPS Take 2 capsules (2,000 mg total) by mouth 2 (two) times daily. Patient not taking: Reported on 01/15/2018 05/24/17   Houston Siren, MD  pravastatin (PRAVACHOL) 20 MG tablet Take 1 tablet (20 mg total) by mouth daily. Patient not taking: Reported on 01/15/2018 05/24/17   Houston Siren, MD  traMADol (ULTRAM) 50 MG tablet Take 1 tablet (50 mg total) by mouth every 6 (six) hours as  needed. Patient not taking: Reported on 01/15/2018 07/15/17   Ivery Quale, PA-C    Family History Family History  Problem Relation Age of Onset  . Heart disease Father   . Heart attack Sister        Died in her 23s  . CAD Brother   . Heart attack Brother   . CAD Brother   . Heart attack Brother     Social History Social History   Tobacco Use  . Smoking status: Current Every Day Smoker    Packs/day: 3.00    Years: 60.00    Pack years: 180.00    Types: Cigarettes    Last attempt to quit: 02/11/2015    Years since quitting: 3.5  . Smokeless tobacco: Never Used  Substance Use Topics  . Alcohol use: Not Currently    Alcohol/week: 1.0 standard drinks    Types: 1 Cans of beer per week    Comment: one beer a week  . Drug use: No     Allergies   Patient has no known allergies.   Review of Systems Review of Systems  Constitutional: Negative.   HENT: Negative.   Respiratory: Negative.   Cardiovascular: Positive for chest pain.       Syncope  Gastrointestinal: Positive for abdominal pain, nausea and vomiting.  Musculoskeletal: Negative.   Skin: Negative.   Allergic/Immunologic: Negative.   Neurological: Negative.   Psychiatric/Behavioral: Negative.   All other systems reviewed and are negative.    Physical Exam Updated Vital Signs BP 135/69 (BP Location: Right Arm)   Pulse 65   Temp 97.8 F (36.6 C) (Oral)   Resp (!) 24   Wt 64 kg   SpO2 96%   BMI 22.77 kg/m   Physical Exam  Constitutional:  Chronically ill-appearing  HENT:  Head: Normocephalic and atraumatic.  Eyes: Pupils are equal, round, and reactive to light. Conjunctivae are normal.  Neck: Neck supple. No tracheal deviation present. No thyromegaly present.  Cardiovascular: Normal rate and regular rhythm.  No murmur heard. Pulmonary/Chest: Effort normal and breath sounds normal.  Abdominal: Soft. Bowel sounds are normal. He exhibits no distension. There is tenderness.  tenderat epigastrium   Musculoskeletal: Normal range of motion. He exhibits no edema or tenderness.  Neurological: He is alert. Coordination normal.  Skin: Skin is warm and dry. No rash noted.  Psychiatric: He has a normal mood and affect.  Nursing note and vitals reviewed.    ED Treatments / Results  Labs (all labs ordered are listed, but only abnormal results are displayed) Labs Reviewed  CBC WITH DIFFERENTIAL/PLATELET - Abnormal; Notable for the following components:      Result Value   RBC 3.74 (*)    Hemoglobin 12.9 (*)    MCV 106.1 (*)    MCH 34.5 (*)    All other components within normal limits  TROPONIN I  COMPREHENSIVE METABOLIC PANEL  LIPASE, BLOOD   Results  for orders placed or performed during the hospital encounter of 08/13/18  Troponin I  Result Value Ref Range   Troponin I <0.03 <0.03 ng/mL  Comprehensive metabolic panel  Result Value Ref Range   Sodium 137 135 - 145 mmol/L   Potassium 4.4 3.5 - 5.1 mmol/L   Chloride 104 98 - 111 mmol/L   CO2 28 22 - 32 mmol/L   Glucose, Bld 103 (H) 70 - 99 mg/dL   BUN 17 8 - 23 mg/dL   Creatinine, Ser 1.61 0.61 - 1.24 mg/dL   Calcium 8.4 (L) 8.9 - 10.3 mg/dL   Total Protein 6.5 6.5 - 8.1 g/dL   Albumin 3.6 3.5 - 5.0 g/dL   AST 21 15 - 41 U/L   ALT 10 0 - 44 U/L   Alkaline Phosphatase 60 38 - 126 U/L   Total Bilirubin 0.6 0.3 - 1.2 mg/dL   GFR calc non Af Amer >60 >60 mL/min   GFR calc Af Amer >60 >60 mL/min   Anion gap 5 5 - 15  CBC with Differential/Platelet  Result Value Ref Range   WBC 8.0 4.0 - 10.5 K/uL   RBC 3.74 (L) 4.22 - 5.81 MIL/uL   Hemoglobin 12.9 (L) 13.0 - 17.0 g/dL   HCT 09.6 04.5 - 40.9 %   MCV 106.1 (H) 80.0 - 100.0 fL   MCH 34.5 (H) 26.0 - 34.0 pg   MCHC 32.5 30.0 - 36.0 g/dL   RDW 81.1 91.4 - 78.2 %   Platelets 167 150 - 400 K/uL   nRBC 0.0 0.0 - 0.2 %   Neutrophils Relative % 47 %   Neutro Abs 3.7 1.7 - 7.7 K/uL   Lymphocytes Relative 42 %   Lymphs Abs 3.3 0.7 - 4.0 K/uL   Monocytes Relative 9 %    Monocytes Absolute 0.8 0.1 - 1.0 K/uL   Eosinophils Relative 1 %   Eosinophils Absolute 0.1 0.0 - 0.5 K/uL   Basophils Relative 1 %   Basophils Absolute 0.0 0.0 - 0.1 K/uL   Immature Granulocytes 0 %   Abs Immature Granulocytes 0.02 0.00 - 0.07 K/uL  Lipase, blood  Result Value Ref Range   Lipase 25 11 - 51 U/L   Ct Angio Chest/abd/pel For Dissection W And/or Wo Contrast  Result Date: 08/13/2018 CLINICAL DATA:  Several day history of chest pain. EXAM: CT ANGIOGRAPHY CHEST, ABDOMEN AND PELVIS TECHNIQUE: Multidetector CT imaging through the chest, abdomen and pelvis was performed using the standard protocol during bolus administration of intravenous contrast. Multiplanar reconstructed images and MIPs were obtained and reviewed to evaluate the vascular anatomy. CONTRAST:  ISOVUE-370 IOPAMIDOL (ISOVUE-370) INJECTION 76% COMPARISON:  05/23/2017 FINDINGS: CTA CHEST FINDINGS Cardiovascular: The heart is normal in size. No pericardial effusion. The aorta is normal in caliber. No dissection. The branch vessels are patent. Scattered atherosclerotic calcifications. Three-vessel coronary artery calcifications and surgical changes from bypass surgery. Mediastinum/Nodes: Stable mediastinal and hilar lymph nodes. No new or progressive findings. The esophagus is grossly normal. Lungs/Pleura: Severe emphysema and pulmonary scarring. Enlarging superior segment right lower lobe pulmonary nodule adjacent to the major fissure. It measures 11 x 11 mm and previously measured 9.5 x 9.5 mm. Stable calcified granulomas in the right middle lobe. Musculoskeletal: No significant bony findings. Review of the MIP images confirms the above findings. CTA ABDOMEN AND PELVIS FINDINGS VASCULAR Aorta: Infrarenal abdominal aortic aneurysm measuring a maximum of 4.1 cm. This is stable when compared to the prior CT scan. Extensive  mural thrombus. This extends right down to the iliac artery bifurcation and there is mild aneurysmal  dilatation of both common iliac arteries and extensive atherosclerotic calcifications. Celiac: Patent. SMA: Patent. Renals: Calcification along the proximal right renal artery but no significant stenosis. Left renal artery is widely patent. IMA: Not identified. Inflow: Severe external and internal iliac artery atherosclerotic calcifications. Veins: Grossly normal Review of the MIP images confirms the above findings. NON-VASCULAR Hepatobiliary: No significant findings. Pancreas: No mass, inflammation or ductal dilatation. Spleen: Normal size.  No focal lesions. Adrenals/Urinary Tract: The adrenal glands and kidneys are grossly normal. Stomach/Bowel: The stomach, duodenum, small bowel and colon are grossly normal without oral contrast. No inflammatory changes, mass lesions or obstructive findings. The terminal ileum and appendix are normal. Moderate stool throughout the colon. Lymphatic: No overt adenopathy. Reproductive: The prostate gland and seminal vesicles are grossly normal. Other: No ascites or abdominal wall hernia. Musculoskeletal: Bilateral pars defects at L5. Review of the MIP images confirms the above findings. IMPRESSION: 1. Stable 4.1 cm infrarenal abdominal aortic aneurysm with extensive mural thrombus. 2. No thoracic aneurysm or dissection. 3. Enlarging superior segment right lower lobe pulmonary nodule. PET-CT may be helpful for further evaluation. 4. Severe emphysema and pulmonary scarring. Electronically Signed   By: Rudie Meyer M.D.   On: 08/13/2018 21:16    EKG EKG Interpretation  Date/Time:  Thursday August 13 2018 18:44:58 EDT Ventricular Rate:  65 PR Interval:    QRS Duration: 101 QT Interval:  407 QTC Calculation: 424 R Axis:   82 Text Interpretation:  Sinus rhythm Anterior infarct, old No significant change since last tracing Confirmed by Doug Sou [phone] on 08/13/2018 7:00:07 PM   Radiology No results found.  Procedures Procedures (including critical care  time)  Medications Ordered in ED Medications  aspirin chewable tablet 324 mg (324 mg Oral Given 08/13/18 1918)  metoCLOPramide (REGLAN) injection 5 mg (5 mg Intravenous Given 08/13/18 1919)     Initial Impression / Assessment and Plan / ED Course  I have reviewed the triage vital signs and the nursing notes.  Pertinent labs & imaging results that were available during my care of the patient were reviewed by me and considered in my medical decision making (see chart for details).     9:50 PM nausea and vomiting has resolved after treatment with multiple antiemetics to include intravenous Reglan, Zofran and Phenergan.  His chest pain is mild describing it as 3 or 4 on a scale of 1-10.  Intravenous nitroglycerin drip ordered to titrate the pain hold for systolic blood pressure less than 100.  He has also been administered oral aspirin.  CT angiogram of the chest abdomen pelvis show a stable aortic aneurysm and an enlarging right pulmonary nodule.  Dr Selena Batten consulted and will arrange for overnight stay to stepdown unit. I Counseled patient for 5 minutes on smoking cessation  He was given a nicotine patch Lab work remarkable for mild hypocalcemia otherwise unremarkable Final Clinical Impressions(s) / ED Diagnoses  Diagnoses #1 unstable angina #2 stable aortic aneurysm #3 pulmonary nodule #4 tobacco abuse #5 hypocalcemia Final diagnoses:  None  CRITICAL CARE Performed by: Doug Sou Total critical care time: 45 minutes Critical care time was exclusive of separately billable procedures and treating other patients. Critical care was necessary to treat or prevent imminent or life-threatening deterioration. Critical care was time spent personally by me on the following activities: development of treatment plan with patient and/or surrogate as well as nursing, discussions with  consultants, evaluation of patient's response to treatment, examination of patient, obtaining history from patient  or surrogate, ordering and performing treatments and interventions, ordering and review of laboratory studies, ordering and review of radiographic studies, pulse oximetry and re-evaluation of patient's condition.  ED Discharge Orders    None       Doug Sou, MD 08/13/18 2229

## 2018-08-13 NOTE — ED Triage Notes (Signed)
Pt reports cp for several days and abdominal pain. Pt took 2 nitro at one time on the way here. Pt reports some relief with nitro

## 2018-08-13 NOTE — ED Notes (Signed)
Hospital Provider at bedside 

## 2018-08-14 ENCOUNTER — Observation Stay (HOSPITAL_BASED_OUTPATIENT_CLINIC_OR_DEPARTMENT_OTHER): Payer: Medicare Other

## 2018-08-14 ENCOUNTER — Encounter (HOSPITAL_COMMUNITY): Payer: Self-pay | Admitting: Family Medicine

## 2018-08-14 DIAGNOSIS — I200 Unstable angina: Secondary | ICD-10-CM

## 2018-08-14 DIAGNOSIS — I340 Nonrheumatic mitral (valve) insufficiency: Secondary | ICD-10-CM | POA: Diagnosis not present

## 2018-08-14 DIAGNOSIS — E785 Hyperlipidemia, unspecified: Secondary | ICD-10-CM

## 2018-08-14 DIAGNOSIS — R079 Chest pain, unspecified: Secondary | ICD-10-CM | POA: Diagnosis not present

## 2018-08-14 DIAGNOSIS — I714 Abdominal aortic aneurysm, without rupture: Secondary | ICD-10-CM | POA: Diagnosis not present

## 2018-08-14 DIAGNOSIS — I25119 Atherosclerotic heart disease of native coronary artery with unspecified angina pectoris: Secondary | ICD-10-CM | POA: Diagnosis not present

## 2018-08-14 DIAGNOSIS — R911 Solitary pulmonary nodule: Secondary | ICD-10-CM | POA: Diagnosis present

## 2018-08-14 DIAGNOSIS — K219 Gastro-esophageal reflux disease without esophagitis: Secondary | ICD-10-CM | POA: Diagnosis not present

## 2018-08-14 DIAGNOSIS — Z9889 Other specified postprocedural states: Secondary | ICD-10-CM | POA: Diagnosis not present

## 2018-08-14 DIAGNOSIS — J449 Chronic obstructive pulmonary disease, unspecified: Secondary | ICD-10-CM | POA: Diagnosis not present

## 2018-08-14 DIAGNOSIS — I10 Essential (primary) hypertension: Secondary | ICD-10-CM | POA: Diagnosis not present

## 2018-08-14 DIAGNOSIS — I25708 Atherosclerosis of coronary artery bypass graft(s), unspecified, with other forms of angina pectoris: Secondary | ICD-10-CM | POA: Diagnosis not present

## 2018-08-14 DIAGNOSIS — Z720 Tobacco use: Secondary | ICD-10-CM

## 2018-08-14 DIAGNOSIS — I5042 Chronic combined systolic (congestive) and diastolic (congestive) heart failure: Secondary | ICD-10-CM

## 2018-08-14 DIAGNOSIS — R110 Nausea: Secondary | ICD-10-CM

## 2018-08-14 DIAGNOSIS — Z9114 Patient's other noncompliance with medication regimen: Secondary | ICD-10-CM

## 2018-08-14 LAB — CBC
HEMATOCRIT: 38 % — AB (ref 39.0–52.0)
HEMOGLOBIN: 12.3 g/dL — AB (ref 13.0–17.0)
MCH: 34.2 pg — AB (ref 26.0–34.0)
MCHC: 32.4 g/dL (ref 30.0–36.0)
MCV: 105.6 fL — ABNORMAL HIGH (ref 80.0–100.0)
NRBC: 0 % (ref 0.0–0.2)
Platelets: 144 10*3/uL — ABNORMAL LOW (ref 150–400)
RBC: 3.6 MIL/uL — AB (ref 4.22–5.81)
RDW: 13 % (ref 11.5–15.5)
WBC: 7.4 10*3/uL (ref 4.0–10.5)

## 2018-08-14 LAB — COMPREHENSIVE METABOLIC PANEL
ALT: 10 U/L (ref 0–44)
ANION GAP: 5 (ref 5–15)
AST: 17 U/L (ref 15–41)
Albumin: 3.3 g/dL — ABNORMAL LOW (ref 3.5–5.0)
Alkaline Phosphatase: 46 U/L (ref 38–126)
BILIRUBIN TOTAL: 0.7 mg/dL (ref 0.3–1.2)
BUN: 15 mg/dL (ref 8–23)
CHLORIDE: 107 mmol/L (ref 98–111)
CO2: 27 mmol/L (ref 22–32)
Calcium: 7.9 mg/dL — ABNORMAL LOW (ref 8.9–10.3)
Creatinine, Ser: 0.81 mg/dL (ref 0.61–1.24)
GFR calc Af Amer: >60 mL/min (ref >60)
Glucose, Bld: 78 mg/dL (ref 70–99)
POTASSIUM: 4.1 mmol/L (ref 3.5–5.1)
Sodium: 139 mmol/L (ref 135–145)
TOTAL PROTEIN: 5.7 g/dL — AB (ref 6.5–8.1)

## 2018-08-14 LAB — TROPONIN I: Troponin I: <0.03 ng/mL (ref <0.03)

## 2018-08-14 LAB — LIPID PANEL
CHOL/HDL RATIO: 4.4 ratio
Cholesterol: 145 mg/dL (ref 0–200)
HDL: 33 mg/dL — ABNORMAL LOW (ref >40)
LDL Cholesterol: 94 mg/dL (ref 0–99)
Triglycerides: 88 mg/dL (ref <150)
VLDL: 18 mg/dL (ref 0–40)

## 2018-08-14 LAB — ECHOCARDIOGRAM COMPLETE
Height: 67 in
Weight: 2081.1424 oz

## 2018-08-14 LAB — MRSA PCR SCREENING: MRSA by PCR: NEGATIVE

## 2018-08-14 MED ORDER — HYDROCODONE-ACETAMINOPHEN 7.5-325 MG PO TABS: 1.0000 | ORAL_TABLET | Freq: Four times a day (QID) | ORAL | Status: DC | PRN

## 2018-08-14 MED ORDER — PANTOPRAZOLE SODIUM 40 MG PO TBEC: 40.0000 mg | DELAYED_RELEASE_TABLET | Freq: Two times a day (BID) | ORAL | 0 refills | Status: DC

## 2018-08-14 MED ORDER — ASPIRIN EC 81 MG PO TBEC
81.0000 mg | DELAYED_RELEASE_TABLET | Freq: Every day | ORAL | Status: DC
  Administered 2018-08-14: 81 mg via ORAL
  Filled 2018-08-14: qty 1

## 2018-08-14 MED ORDER — PANTOPRAZOLE SODIUM 40 MG PO TBEC
40.0000 mg | DELAYED_RELEASE_TABLET | Freq: Two times a day (BID) | ORAL | Status: DC
  Administered 2018-08-14: 40 mg via ORAL
  Filled 2018-08-14: qty 1

## 2018-08-14 MED ORDER — ATORVASTATIN CALCIUM 20 MG PO TABS
20.0000 mg | ORAL_TABLET | Freq: Every day | ORAL | Status: DC
  Administered 2018-08-14: 20 mg via ORAL
  Filled 2018-08-14 (×2): qty 1

## 2018-08-14 MED ORDER — ALPRAZOLAM 0.5 MG PO TABS: 1.0000 mg | ORAL_TABLET | Freq: Four times a day (QID) | ORAL | Status: DC | PRN

## 2018-08-14 MED ORDER — ZOLPIDEM TARTRATE 5 MG PO TABS: 5.0000 mg | ORAL_TABLET | Freq: Every evening | ORAL | Status: DC | PRN

## 2018-08-14 MED ORDER — SUCRALFATE 1 GM/10ML PO SUSP
1.0000 g | Freq: Three times a day (TID) | ORAL | Status: DC
  Administered 2018-08-14: 1 g via ORAL
  Filled 2018-08-14: qty 10

## 2018-08-14 MED ORDER — ATORVASTATIN CALCIUM 20 MG PO TABS: 20.0000 mg | ORAL_TABLET | Freq: Every day | ORAL | 0 refills | Status: DC

## 2018-08-14 NOTE — Progress Notes (Signed)
*  PRELIMINARY RESULTS* Echocardiogram 2D Echocardiogram has been performed.  [name] 08/14/2018, 10:07 AM

## 2018-08-14 NOTE — H&P (View-Only) (Signed)
Referring Provider: No ref. provider found Primary Care Physician:  Pll[facility], [facility] Primary Gastroenterologist:  Dr. Fields (previously unassigned)  Date of Admission: 08/13/18 Date of Consultation: 08/14/18  Reason for Consultation:  GERD, nausea  HPI:  [name] is a 67 y.o. male with a past medical history of AAA, anxiety, COPD, coronary artery disease status post CABG x2, myocardial infarction, pulmonary nodule.  The patient presented to the emergency department yesterday with complaints of substernal chest pain for the previous 2 days and nausea for the previous 2 weeks.  Emergency department and hospitalist notes reviewed.  He states he has had long-standing GERD symptoms poorly controlled with over-the-counter medications.  He has never been evaluated by outpatient GI that we can see in our system.  Also with atypical chest pain and history of coronary artery disease status post CABG.  His troponins were negative x3, echocardiogram has been ordered.  Cardiology has been consulted.  On admission he was started on PPI via Protonix 40 mg once daily.  Today he states his nausea is improved.  He began having GERD symptoms 2 weeks ago and is never had this before.  Seems acute onset.  He did call his primary care with complaints of GERD symptoms and they sent in medications to his pharmacy, although he cannot member what medications.  He states these did not help.  He is also tried over-the-counter Pepto-Bismol and Maalox, which also did not help.  He denies any overt abdominal pain at this time.  Denies hematochezia, melena.  Admits esophageal/throat burning.  Denies any other GI symptoms.  Past Medical History:  Diagnosis Date  . AAA (abdominal aortic aneurysm) (HCC)   . Anxiety   . Arthritis   . COPD (chronic obstructive pulmonary disease) (HCC)   . Coronary artery disease involving native coronary artery with angina pectoris (HCC) 01/2015   100% LAD, Severe dom RCA  dz, small non-dom Cx --> CABG x 2 LIMA-LAD, SVG-RCA  . MI (myocardial infarction) (HCC)   . Pulmonary nodule    benign    Past Surgical History:  Procedure Laterality Date  . CARDIAC CATHETERIZATION  01/2015   Coronary angiography on 02/17/15 demonstrated the left main coronary artery to have mild 30-40% stenosis, 100% LAD stenosis with right to left collaterals and faint left to left collaterals, small nondominant left circumflex, small to medium caliber first obtuse marginal branch with 20% ostial and proximal stenosis. The RCA was noted to have diffuse disease.  . CARDIAC CATHETERIZATION N/A 11/11/2016   Procedure: Left Heart Cath and Cors/Grafts Angiography;  Surgeon: David W Harding, MD;  Location: [facility] INVASIVE CV LAB;  Service: Cardiovascular;  Laterality: N/A;  . CORONARY ARTERY BYPASS GRAFT  01/2015  . INGUINAL HERNIA REPAIR Bilateral   . VALVE REPLACEMENT  01/2015   Tricuspid Valve Replacement    Prior to Admission medications   Medication Sig Start Date End Date Taking? Authorizing Provider  ALPRAZolam (XANAX) 1 MG tablet Take 1 mg by mouth 4 (four) times daily as needed for anxiety.  09/17/16  Yes [provider]  aspirin EC 81 MG tablet Take 81 mg by mouth daily.    Yes [provider]  HYDROcodone-acetaminophen (NORCO) 7.5-325 MG tablet Take 1 tablet by mouth every 6 (six) hours as needed for moderate pain.  08/16/16  Yes [provider]  nitroGLYCERIN (NITROSTAT) 0.4 MG SL tablet PLACE 1 TABLET UNDER THE TONGUE EVERY 5 MINUTES AS NEEDED FOR CHEST PAIN Patient taking differently: Place   0.4 mg under the tongue every 5 (five) minutes as needed for chest pain.  02/11/18  Yes Lawrence, Kathryn M, NP  promethazine (PHENERGAN) 25 MG tablet Take 25 mg by mouth every 6 (six) hours as needed for nausea or vomiting.  08/03/18  Yes [provider]  zolpidem (AMBIEN) 10 MG tablet Take 10 mg by mouth at bedtime as needed for sleep.  07/27/18  Yes [provider]  azithromycin (ZITHROMAX) 250 MG tablet Take 250-500 mg by mouth daily. Take two tablets (500mg total) on day 1, then take one tablet (250mg total) on days 2 through 5. Starting on 08/07/2018    [provider]    Current Facility-Administered Medications  Medication Dose Route Frequency Provider Last Rate Last Dose  . 0.9 %  sodium chloride infusion  250 mL Intravenous PRN Kim, James, MD      . acetaminophen (TYLENOL) tablet 650 mg  650 mg Oral Q6H PRN Kim, James, MD       Or  . acetaminophen (TYLENOL) suppository 650 mg  650 mg Rectal Q6H PRN Kim, James, MD      . ALPRAZolam (XANAX) tablet 1 mg  1 mg Oral QID PRN Kim, James, MD      . aspirin EC tablet 81 mg  81 mg Oral Daily Kim, James, MD      . enoxaparin (LOVENOX) injection 40 mg  40 mg Subcutaneous Q24H Kim, James, MD   40 mg at 08/14/18 0005  . HYDROcodone-acetaminophen (NORCO) 7.5-325 MG per tablet 1 tablet  1 tablet Oral Q6H PRN Kim, James, MD      . nicotine (NICODERM CQ - dosed in mg/24 hours) patch 21 mg  21 mg Transdermal Once Jacubowitz, Sam, MD   21 mg at 08/13/18 2143  . ondansetron (ZOFRAN) injection 4 mg  4 mg Intravenous Q6H PRN Kim, James, MD      . pantoprazole (PROTONIX) EC tablet 40 mg  40 mg Oral Daily Kim, James, MD      . sodium chloride flush (NS) 0.9 % injection 3 mL  3 mL Intravenous Q12H Kim, James, MD      . sodium chloride flush (NS) 0.9 % injection 3 mL  3 mL Intravenous PRN Kim, James, MD      . zolpidem (AMBIEN) tablet 5 mg  5 mg Oral QHS PRN Kim, James, MD        Allergies as of 08/13/2018  . (No Known Allergies)    Family History  Problem Relation Age of Onset  . Heart disease Father   . Heart attack Sister        Died in her 50s  . CAD Brother   . Heart attack Brother   . CAD Brother   . Heart attack Brother     Social History   Socioeconomic History  . Marital status: Divorced    Spouse name: Not on file  . Number of children: Not on file  . Years of education:  Not on file  . Highest education level: Not on file  Occupational History  . Not on file  Social Needs  . Financial resource strain: Not on file  . Food insecurity:    Worry: Not on file    Inability: Not on file  . Transportation needs:    Medical: Not on file    Non-medical: Not on file  Tobacco Use  . Smoking status: Current Every Day Smoker    Packs/day: 3.00      Years: 60.00    Pack years: 180.00    Types: Cigarettes    Last attempt to quit: 02/11/2015    Years since quitting: 3.5  . Smokeless tobacco: Never Used  Substance and Sexual Activity  . Alcohol use: Not Currently    Alcohol/week: 1.0 standard drinks    Types: 1 Cans of beer per week    Comment: one beer a week  . Drug use: No  . Sexual activity: Not on file  Lifestyle  . Physical activity:    Days per week: Not on file    Minutes per session: Not on file  . Stress: Not on file  Relationships  . Social connections:    Talks on phone: Not on file    Gets together: Not on file    Attends religious service: Not on file    Active member of club or organization: Not on file    Attends meetings of clubs or organizations: Not on file    Relationship status: Not on file  . Intimate partner violence:    Fear of current or ex partner: Not on file    Emotionally abused: Not on file    Physically abused: Not on file    Forced sexual activity: Not on file  Other Topics Concern  . Not on file  Social History Narrative  . Not on file    Review of Systems: General: Negative for anorexia, weight loss, fever, chills, fatigue, weakness. ENT: Negative for hoarseness, difficulty swallowing. CV: Negative for chest pain, angina, palpitations, peripheral edema.  Respiratory: Negative for dyspnea at rest, cough, sputum, wheezing.  GI: See history of present illness. Endo: Negative for unusual weight change.  Heme: Negative for bruising or bleeding. Allergy: Negative for rash or hives.  Physical Exam: Vital signs in  last 24 hours: Temp:  [97.5 F (36.4 C)-97.8 F (36.6 C)] 97.5 F (36.4 C) (10/25 0749) Pulse Rate:  [51-75] 59 (10/25 0700) Resp:  [0-24] 17 (10/25 0700) BP: (74-149)/(38-93) 97/57 (10/25 0700) SpO2:  [90 %-100 %] 93 % (10/25 0700) Weight:  [59 kg-64 kg] 59 kg (10/25 0500) Last BM Date: 08/13/18 General:   Alert,  Well-developed, well-nourished, pleasant and cooperative in NAD Head:  Normocephalic and atraumatic. Eyes:  Sclera clear, no icterus. Conjunctiva pink. Ears:  Normal auditory acuity. Neck:  Supple; no masses or thyromegaly. Lungs:  Clear throughout to auscultation.   No wheezes, crackles, or rhonchi. No acute distress. Heart:  Regular rate and rhythm; no murmurs, clicks, rubs,  or gallops. Abdomen:  Soft, and nondistended. No TTP epigastric or abdomen. No masses, hepatosplenomegaly or hernias noted. Normal bowel sounds, without guarding, and without rebound.   Rectal:  Deferred.   Msk:  Symmetrical without gross deformities. Pulses:  Normal bilateral DP pulses noted. Extremities:  Without clubbing or edema. Neurologic:  Alert and  oriented x4;  grossly normal neurologically. Skin:  Intact without significant lesions or rashes. Psych:  Alert and cooperative. Normal mood and affect.  Intake/Output from previous day: 10/24 0701 - 10/25 0700 In: 7.7 [I.V.:7.7] Out: 1000 [Urine:1000] Intake/Output this shift: Total I/O In: 1.1 [I.V.:1.1] Out: -   Lab Results: Recent Labs    08/13/18 1900 08/14/18 0353  WBC 8.0 7.4  HGB 12.9* 12.3*  HCT 39.7 38.0*  PLT 167 144*   BMET Recent Labs    08/13/18 1900 08/14/18 0353  NA 137 139  K 4.4 4.1  CL 104 107  CO2 28 27  GLUCOSE 103* 78    BUN 17 15  CREATININE 1.02 0.81  CALCIUM 8.4* 7.9*   LFT Recent Labs    08/13/18 1900 08/14/18 0353  PROT 6.5 5.7*  ALBUMIN 3.6 3.3*  AST 21 17  ALT 10 10  ALKPHOS 60 46  BILITOT 0.6 0.7   PT/INR No results for input(s): LABPROT, INR in the last 72 hours. Hepatitis  Panel No results for input(s): HEPBSAG, HCVAB, HEPAIGM, HEPBIGM in the last 72 hours. C-Diff No results for input(s): CDIFFTOX in the last 72 hours.  Studies/Results: Ct Angio Chest/abd/pel For Dissection W And/or Wo Contrast  Result Date: 08/13/2018 CLINICAL DATA:  Several day history of chest pain. EXAM: CT ANGIOGRAPHY CHEST, ABDOMEN AND PELVIS TECHNIQUE: Multidetector CT imaging through the chest, abdomen and pelvis was performed using the standard protocol during bolus administration of intravenous contrast. Multiplanar reconstructed images and MIPs were obtained and reviewed to evaluate the vascular anatomy. CONTRAST:  100mL ISOVUE-370 IOPAMIDOL (ISOVUE-370) INJECTION 76% COMPARISON:  05/23/2017 FINDINGS: CTA CHEST FINDINGS Cardiovascular: The heart is normal in size. No pericardial effusion. The aorta is normal in caliber. No dissection. The branch vessels are patent. Scattered atherosclerotic calcifications. Three-vessel coronary artery calcifications and surgical changes from bypass surgery. Mediastinum/Nodes: Stable mediastinal and hilar lymph nodes. No new or progressive findings. The esophagus is grossly normal. Lungs/Pleura: Severe emphysema and pulmonary scarring. Enlarging superior segment right lower lobe pulmonary nodule adjacent to the major fissure. It measures 11 x 11 mm and previously measured 9.5 x 9.5 mm. Stable calcified granulomas in the right middle lobe. Musculoskeletal: No significant bony findings. Review of the MIP images confirms the above findings. CTA ABDOMEN AND PELVIS FINDINGS VASCULAR Aorta: Infrarenal abdominal aortic aneurysm measuring a maximum of 4.1 cm. This is stable when compared to the prior CT scan. Extensive mural thrombus. This extends right down to the iliac artery bifurcation and there is mild aneurysmal dilatation of both common iliac arteries and extensive atherosclerotic calcifications. Celiac: Patent. SMA: Patent. Renals: Calcification along the proximal  right renal artery but no significant stenosis. Left renal artery is widely patent. IMA: Not identified. Inflow: Severe external and internal iliac artery atherosclerotic calcifications. Veins: Grossly normal Review of the MIP images confirms the above findings. NON-VASCULAR Hepatobiliary: No significant findings. Pancreas: No mass, inflammation or ductal dilatation. Spleen: Normal size.  No focal lesions. Adrenals/Urinary Tract: The adrenal glands and kidneys are grossly normal. Stomach/Bowel: The stomach, duodenum, small bowel and colon are grossly normal without oral contrast. No inflammatory changes, mass lesions or obstructive findings. The terminal ileum and appendix are normal. Moderate stool throughout the colon. Lymphatic: No overt adenopathy. Reproductive: The prostate gland and seminal vesicles are grossly normal. Other: No ascites or abdominal wall hernia. Musculoskeletal: Bilateral pars defects at L5. Review of the MIP images confirms the above findings. IMPRESSION: 1. Stable 4.1 cm infrarenal abdominal aortic aneurysm with extensive mural thrombus. 2. No thoracic aneurysm or dissection. 3. Enlarging superior segment right lower lobe pulmonary nodule. PET-CT may be helpful for further evaluation. 4. Severe emphysema and pulmonary scarring. Electronically Signed   By: P.  Gallerani M.D.   On: 08/13/2018 21:16    Impression: Pleasant 67-year-old male with a cardiac history who was admitted to the hospital for chest pain work-up and possible acute coronary syndrome.  Cardiology is seeing him today and a doubt cardiac etiology.  He does have cardiac echo pending for wall motion abnormality or decrease in heart function which could change his care.  Acute onset of GERD symptoms about 2 weeks ago   with chest pain about 2 days ago.  He was having significant nausea, although this is improved today.  He was admitted and started on a PPI.  Overall he feels somewhat better today.  Cardiac labs normal, EKG no  acute changes.  Plan: 1. Increase Protonix to id 2. Add Carafate tid and hs 3. H. Pylori serologies 4. Prn antiemetics 5. Follow cardiac workup 6. Will eventually benefit from EGD, query IP versus OP (likely pending cardiac workup) 7. Supportive measures   Thank you for allowing us to participate in the care of Jameon R [place]   , DNP, AGNP-C Adult & Gerontological Nurse Practitioner [facility]    LOS: 0 days     08/14/2018, 8:21 AM    

## 2018-08-14 NOTE — Consult Note (Addendum)
Cardiology Consult    Patient ID: [name]; [id number]; [birth year]   Admit date: 08/13/2018 Date of Consult: 08/14/2018  Primary Care Provider: [facility] Primary Cardiologist: Prentice Docker, MD   Patient Profile    [name] is a 67 y.o. 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 Selena Batten.   History of Present Illness    [name] was evaluated at [facility] ED by Dr. Anne Fu in 12/2017 for evaluation of chest discomfort over the past 2 to 3 weeks which would occur at rest or with activity. Reported pain radiated into his left arm and jaw and would last for 2 to 3 hours at a time and was not worse with exertion. He had also ran out of his medications during the timeframe his symptoms started. Initial and delta troponin values were negative, therefore he was discharged and informed to have an outpatient stress test. The orders for this are in the system but I cannot see where this was ever performed and he has not followed up with Cardiology since.  He presented to [facility] ED on 08/13/2018 for evaluation of chest pain and epigastric discomfort. In talking with the patient today, he reports having persistent nausea over the past 2 weeks and was sent in PRN medications by his PCP with minimal improvement in his symptoms. Starting yesterday, he developed a discomfort along his epigastric region which radiated into his sternum.  He describes this as a burning sensation which started around 5 PM yesterday and persisted into the night. He tried taking Tums along with sublingual  nitroglycerin at home with minimal improvement in his symptoms. He was administered Reglan upon admission along with IV Zofran with resolution of his pain. Was also placed on nitroglycerin drip at that time which has since been discontinued due to hypotension. He denies any recurrent discomfort since admission. No recent dyspnea on exertion, orthopnea, PND, or lower extremity edema.  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 He does smoke 2 to 3 packs of cigarettes per day.He has not taken any prescription medications over the past year due to these making him feel fatigued. Unsure if it was a specific medication as he stopped everything at once. He does report compliance with ASA 81 mg daily.  Initial labs showed WBC 8.0, Hgb 12.9, platelets 167, Na+ 137, K+ 4.4, and creatinine 1.02.  Initial and cyclic troponin values have been negative.  FLP shows total cholesterol 145, Triglycerides 88, HDL 33, and LDL 94.  EKG shows NSR, HR 65, with no acute ST changes when compared to prior tracings. CTA showed a stable 4.1 cm abdominal aortic aneurysm with mural thrombus with no evidence of aneurysm or dissection.     Past Medical History:  Diagnosis Date  . AAA (abdominal aortic aneurysm) (HCC)   . Anxiety   . Arthritis   . COPD (chronic obstructive pulmonary disease) (HCC)   . Coronary artery disease involving native coronary artery with angina pectoris (HCC) 01/2015   100% LAD, Severe dom RCA dz, small non-dom Cx -->  CABG x 2 LIMA-LAD, SVG-RCA  . MI (myocardial infarction) (HCC)   . Pulmonary nodule    benign    Past Surgical History:  Procedure Laterality Date  . CARDIAC CATHETERIZATION  01/2015   Coronary angiography on 02/17/15 demonstrated the left main coronary artery to have mild 30-40% stenosis, 100% LAD stenosis with right to left collaterals and faint left to left collaterals, small nondominant left circumflex, small to medium caliber first  obtuse marginal branch with 20% ostial and proximal stenosis. The RCA was noted to have diffuse disease.  Marland Kitchen CARDIAC CATHETERIZATION N/A 11/11/2016   Procedure: Left Heart Cath and Cors/Grafts Angiography;  Surgeon: Marykay Lex, MD;  Location: [facility] INVASIVE CV LAB;  Service: Cardiovascular;  Laterality: N/A;  . CORONARY ARTERY BYPASS GRAFT  01/2015  . INGUINAL HERNIA REPAIR Bilateral   . VALVE REPLACEMENT  01/2015   Tricuspid Valve Replacement     Home Medications:  Prior to Admission medications   Medication Sig Start Date End Date Taking? Authorizing Provider  ALPRAZolam Prudy Feeler) 1 MG tablet Take 1 mg by mouth 4 (four) times daily as needed for anxiety.  09/17/16  Yes [provider]  aspirin EC 81 MG tablet Take 81 mg by mouth daily.    Yes [provider]  HYDROcodone-acetaminophen (NORCO) 7.5-325 MG tablet Take 1 tablet by mouth every 6 (six) hours as needed for moderate pain.  08/16/16  Yes [provider]  nitroGLYCERIN (NITROSTAT) 0.4 MG SL tablet PLACE 1 TABLET UNDER THE TONGUE EVERY 5 MINUTES AS NEEDED FOR CHEST PAIN Patient taking differently: Place 0.4 mg under the tongue every 5 (five) minutes as needed for chest pain.  02/11/18  Yes Jodelle Gross, NP  promethazine (PHENERGAN) 25 MG tablet Take 25 mg by mouth every 6 (six) hours as needed for nausea or vomiting.  08/03/18  Yes [provider]  zolpidem (AMBIEN) 10 MG tablet Take 10 mg by mouth at bedtime as needed for sleep.  07/27/18  Yes [provider]  azithromycin (ZITHROMAX) 250 MG tablet Take 250-500 mg by mouth daily. Take two tablets (500mg  total) on day 1, then take one tablet (250mg  total) on days 2 through 5. Starting on 08/07/2018    [provider]    Inpatient Medications: Scheduled Meds: . aspirin EC  81 mg Oral Daily  . atorvastatin  20 mg Oral q1800  . enoxaparin (LOVENOX) injection  40 mg Subcutaneous Q24H  . nicotine  21 mg Transdermal Once  .  pantoprazole  40 mg Oral Daily  . sodium chloride flush  3 mL Intravenous Q12H   Continuous Infusions: . sodium chloride     PRN Meds: sodium chloride, acetaminophen **OR** acetaminophen, ALPRAZolam, HYDROcodone-acetaminophen, ondansetron (ZOFRAN) IV, sodium chloride flush, zolpidem  Allergies:   No Known Allergies  Social History:   Social History   Socioeconomic History  . Marital status: Divorced    Spouse name: Not on file  . Number of children: Not on file  . Years of education: Not on file  . Highest education level: Not on file  Occupational History  . Not on file  Social Needs  . Financial resource strain: Not on file  . Food insecurity:    Worry: Not on file    Inability: Not on file  . Transportation needs:    Medical: Not on file    Non-medical: Not on file  Tobacco Use  . Smoking status: Current Every Day Smoker    Packs/day: 3.00  Years: 60.00    Pack years: 180.00    Types: Cigarettes    Last attempt to quit: 02/11/2015    Years since quitting: 3.5  . Smokeless tobacco: Never Used  Substance and Sexual Activity  . Alcohol use: Not Currently    Alcohol/week: 1.0 standard drinks    Types: 1 Cans of beer per week    Comment: one beer a week  . Drug use: No  . Sexual activity: Not on file  Lifestyle  . Physical activity:    Days per week: Not on file    Minutes per session: Not on file  . Stress: Not on file  Relationships  . Social connections:    Talks on phone: Not on file    Gets together: Not on file    Attends religious service: Not on file    Active member of club or organization: Not on file    Attends meetings of clubs or organizations: Not on file    Relationship status: Not on file  . Intimate partner violence:    Fear of current or ex partner: Not on file    Emotionally abused: Not on file    Physically abused: Not on file    Forced sexual activity: Not on file  Other Topics Concern  . Not on file  Social History Narrative  .  Not on file     Family History:    Family History  Problem Relation Age of Onset  . Heart disease Father   . Heart attack Sister        Died in her 62s  . CAD Brother   . Heart attack Brother   . CAD Brother   . Heart attack Brother       Review of Systems    General:  No chills, fever, night sweats or weight changes.  Cardiovascular:  No dyspnea on exertion, edema, orthopnea, palpitations, paroxysmal nocturnal dyspnea. Positive for sternal chest pain.  Dermatological: No rash, lesions/masses Respiratory: No cough, dyspnea Urologic: No hematuria, dysuria Abdominal:   No vomiting, diarrhea, bright red blood per rectum, melena, or hematemesis. Positive for nausea and epigastric pain.  Neurologic:  No visual changes, wkns, changes in mental status. All other systems reviewed and are otherwise negative except as noted above.  Physical Exam/Data    Vitals:   08/14/18 0600 08/14/18 0700 08/14/18 0749 08/14/18 0750  BP: (!) 94/56 (!) 97/57    Pulse: (!) 58 (!) 59    Resp: (!) 0 17    Temp:   (!) 97.5 F (36.4 C)   TempSrc:   Oral   SpO2: 94% 93%    Weight:      Height:    5\' 7"  (1.702 m)    Intake/Output Summary (Last 24 hours) at 08/14/2018 0852 Last data filed at 08/14/2018 0714 Gross per 24 hour  Intake 8.83 ml  Output 1000 ml  Net -991.17 ml   Filed Weights   08/13/18 1845 08/14/18 0500  Weight: 64 kg 59 kg   Body mass index is 20.37 kg/m.   General: Pleasant, Caucasian male appearing in NAD Psych: Normal affect. Neuro: Alert and oriented X 3. Moves all extremities spontaneously. HEENT: Normal  Neck: Supple without bruits or JVD. Lungs:  Resp regular and unlabored, CTA without wheezing or rales. Heart: RRR no s3, s4, or murmurs. Abdomen: Soft, non-tender, non-distended, BS + x 4.  Extremities: No clubbing, cyanosis or lower extremity edema. DP/PT/Radials 2+ and equal bilaterally.  EKG:  The EKG was personally reviewed and demonstrates: NSR, HR 65,  with no acute ST changes when compared to prior tracings.   Labs/Studies     Relevant CV Studies:  Cardiac Catheterization: 10/2016  Ost LAD to Prox LAD lesion, 100 %stenosed. Short reconstitution from LIMA with Prox LAD to Mid LAD lesion, 95 %stenosed.  LIMA-mLAD and is normal in caliber and anatomically normal. Dist-apical LAD lesion, 60 %stenosed.  Prox RCA to Dist RCA lesion, 100 %stenosed.  Seq SVG- dRCA and is large and anatomically normal - fills small rPDA & RPAV with 2 RPLs  Patent non-dominant Circumflex with minimal CAD.  _________________________________________  There is mild left ventricular systolic dysfunction. The left ventricular ejection fraction is 45-50% by visual estimate.  LV end diastolic pressure is mildly elevated.    Widely patent grafts and native circumflex with known occlusion of the LAD and now occluded RCA. No obvious culprit lesion to explain the patient's angina besides the short segment of the proximal LAD isolated by proximal occlusion and distal 95% stenosis. No PCI targets.  Plan: Return to short stay for TR band removal Discharge following bed rest.  Recommendations:  Optimize medical management with antianginal therapy.  Smoking cessation counseling  F/u with Dr. Purvis Sheffield  Echocardiogram: 10/2016 Study Conclusions  - Left ventricle: The cavity size was normal. Wall thickness was   increased in a pattern of mild LVH. Systolic function was mildly   reduced. The estimated ejection fraction was 45%. - Regional wall motion abnormality: Moderate hypokinesis of the   mid-apical anterior, basal anteroseptal, basal-mid inferoseptal,   and apical septal myocardium; mild hypokinesis of the mid   anteroseptal and apical myocardium. - Aortic valve: Mildly thickened, moderately calcified leaflets.   Morphologically, there may be very mild aortic stenosis. - Mitral valve: Mildly calcified annulus. Normal thickness leaflets   .  There was mild regurgitation. - Left atrium: The atrium was mildly dilated. - Right ventricle: Systolic function was mildly reduced. 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 There was trivial   regurgitation. - Pulmonary arteries: PA peak pressure: 35 mm Hg (S). - Systemic veins: Dilated IVC with normal respiratory variation.   Estimated CVP 8 mmHg.  Laboratory Data:  Chemistry Recent Labs  Lab 08/13/18 1900 08/14/18 0353  NA 137 139  K 4.4 4.1  CL 104 107  CO2 28 27  GLUCOSE 103* 78  BUN 17 15  CREATININE 1.02 0.81  CALCIUM 8.4* 7.9*  GFRNONAA >60 >60  GFRAA >60 >60  ANIONGAP 5 5    Recent Labs  Lab 08/13/18 1900 08/14/18 0353  PROT 6.5 5.7*  ALBUMIN 3.6 3.3*  AST 21 17  ALT 10 10  ALKPHOS 60 46  BILITOT 0.6 0.7   Hematology Recent Labs  Lab 08/13/18 1900 08/14/18 0353  WBC 8.0 7.4  RBC 3.74* 3.60*  HGB 12.9* 12.3*  HCT 39.7 38.0*  MCV 106.1* 105.6*  MCH 34.5* 34.2*  MCHC 32.5 32.4  RDW 13.2 13.0  PLT 167 144*   Cardiac Enzymes Recent Labs  Lab 08/13/18 1900 08/13/18 2234 08/14/18 0353  TROPONINI <0.03 <0.03 <0.03   No results for input(s): TROPIPOC in the last 168 hours.  BNPNo results for input(s): BNP, PROBNP in the last 168 hours.  DDimer No results for input(s): DDIMER in the last 168 hours.  Radiology/Studies:  Ct Angio Chest/abd/pel For Dissection W And/or Wo Contrast  Result Date: 08/13/2018  CLINICAL DATA:  Several day history of chest pain. EXAM: CT ANGIOGRAPHY CHEST, ABDOMEN AND PELVIS TECHNIQUE: Multidetector CT imaging through the chest, abdomen and pelvis was performed using the standard protocol during bolus administration of intravenous contrast. Multiplanar reconstructed images and MIPs were obtained and reviewed to evaluate the vascular anatomy. CONTRAST:  ISOVUE-370 IOPAMIDOL (ISOVUE-370) INJECTION 76% COMPARISON:  05/23/2017 FINDINGS: CTA CHEST FINDINGS  Cardiovascular: The heart is normal in size. No pericardial effusion. The aorta is normal in caliber. No dissection. The branch vessels are patent. Scattered atherosclerotic calcifications. Three-vessel coronary artery calcifications and surgical changes from bypass surgery. Mediastinum/Nodes: Stable mediastinal and hilar lymph nodes. No new or progressive findings. The esophagus is grossly normal. Lungs/Pleura: Severe emphysema and pulmonary scarring. Enlarging superior segment right lower lobe pulmonary nodule adjacent to the major fissure. It measures 11 x 11 mm and previously measured 9.5 x 9.5 mm. Stable calcified granulomas in the right middle lobe. Musculoskeletal: No significant bony findings. Review of the MIP images confirms the above findings. CTA ABDOMEN AND PELVIS FINDINGS VASCULAR Aorta: Infrarenal abdominal aortic aneurysm measuring a maximum of 4.1 cm. This is stable when compared to the prior CT scan. Extensive mural thrombus. This extends right down to the iliac artery bifurcation and there is mild aneurysmal dilatation of both common iliac arteries and extensive atherosclerotic calcifications. Celiac: Patent. SMA: Patent. Renals: Calcification along the proximal right renal artery but no significant stenosis. Left renal artery is widely patent. IMA: Not identified. Inflow: Severe external and internal iliac artery atherosclerotic calcifications. Veins: Grossly normal Review of the MIP images confirms the above findings. NON-VASCULAR Hepatobiliary: No significant findings. Pancreas: No mass, inflammation or ductal dilatation. Spleen: Normal size.  No focal lesions. Adrenals/Urinary Tract: The adrenal glands and kidneys are grossly normal. Stomach/Bowel: The stomach, duodenum, small bowel and colon are grossly normal without oral contrast. No inflammatory changes, mass lesions or obstructive findings. The terminal ileum and appendix are normal. Moderate stool throughout the colon. Lymphatic: No  overt adenopathy. Reproductive: The prostate gland and seminal vesicles are grossly normal. Other: No ascites or abdominal wall hernia. Musculoskeletal: Bilateral pars defects at L5. Review of the MIP images confirms the above findings. IMPRESSION: 1. Stable 4.1 cm infrarenal abdominal aortic aneurysm with extensive mural thrombus. 2. No thoracic aneurysm or dissection. 3. Enlarging superior segment right lower lobe pulmonary nodule. PET-CT may be helpful for further evaluation. 4. Severe emphysema and pulmonary scarring. Electronically Signed   By: Rudie Meyer M.D.   On: 08/13/2018 21:16     Assessment & Plan    1. Atypical Chest Pain - presents with two weeks of persistent nausea and developed epigastric pain yesterday which radiated into his sternum. Describes this as a burning sensation which started around 1700 and lasted until after midnight. His prior anginal equivalent was dyspnea and jaw pain and he denies any recent symptoms.  - Initial and cyclic troponin values have been negative thus far. EKG shows NSR, HR 65, with no acute ST changes when compared to prior tracings. Echocardiogram is pending to assess LV function and wall motion. Overall, his presenting symptoms seem atypical for a cardiac etiology and it is reassuring that his enzymes have been negative despite 6+ hours of constant pain. GI consult was also ordered by the admitting team and he has been started on PPI therapy. If echo shows new abnormalities or EF is further reduced, can consider additional ischemic evaluation. Compliance with his medication regimen was encouraged and will plan to continue ASA  and restart statin therapy with the possible addition of BB therapy pending improvement in his BP.    2. CAD - s/p CABG in 01/2015 with LIMA-LAD and SVG-RCA, patent grafts by cath in 10/2016 as outlined above.  - He previously self-discontinued all of his cardiac medications except ASA within the past year. In 2018, he was  prescribed Carvedilol 3.125 mg twice daily, Plavix 75 mg daily, and Atorvastatin 40 mg daily. Was no longer on Amiodarone as this was prescribed due to post-op atrial fibrillation following CABG and was discontinued in 2016. - will plan to reinitiate Atorvastatin 20mg  daily and continue on ASA 81mg  daily. Would benefit from restarting BB therapy prior to discharge once BP improves but I am concerned about his compliance with this in the outpatient setting.   3. Chronic Combined Systolic and Diastolic CHF - EF was reduced to 45% by echo in 10/2016. He denies any recent dyspnea on exertion, orthopnea, PND, or lower extremity edema. Appears euvolemic by examination today. Repeat echocardiogram is pending to assess for structural abnormalities.  4. 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 Echo in 2018 showed trivial TR. Repeat echo pending.   5. HTN - has been hypotensive since admission due to IV NTG. Drip has now been discontinued. Continue to follow.   6. HLD - FLP shows total cholesterol 145, Triglycerides 88, HDL 33, and LDL 94.  LFT's WNL. Will plan to restart statin therapy with Atorvastatin 20mg  daily. If he is compliant with this in the outpatient setting, would need repeat FLP and LFT's in 6-8 weeks.   7. AAA - CTA this admission shows a stable 4.1 cm abdominal aortic aneurysm with mural thrombus with no evidence of aneurysm or dissection. Was 4.1 cm by CT Imaging in 05/2017 and read as 3.8 cm by AAA Duplex in 12/2017. Followed by Vascular Surgery.   8. Tobacco Use/Medication Noncompliance - he continues to smoke 2-3 packs of cigarettes per day and has done so since 67 years of age. He has no intention of quitting at this time. Importance of reduction with cessation was advised. Also reviewed the importance of compliance with office visits and his medication regimen. Was encouraged to call our office if he does not tolerate a cardiac  medication so we can consider alternatives.    For questions or updates, please contact [facility] Please consult [email or web address] for contact info under Cardiology/STEMI.  Signed, Ellsworth Lennox, PA-C 08/14/2018, 8:52 AM Pager: [phone]  The patient was seen and examined, and I agree with the history, physical exam, assessment and plan as documented above, with modifications as noted below. I have also personally reviewed all relevant documentation, old records, labs, and both radiographic and cardiovascular studies. I have also independently interpreted old and new ECG's.  Briefly, this is a 67 year old male with a history of coronary artery disease and CABG in April 2016 with details noted above and chronic combined systolic and diastolic heart failure with tricuspid valve repair and M3 annuloplasty ring.  He told me he began experiencing abdominal pain and nausea with vomiting about 2 weeks ago.  He denied chest pain per se and said his abdominal pain was radiating upwards.  I asked him about prior anginal symptoms at the time of CABG and he said he had jaw pain at that time but no chest pain or shortness of breath.  He has not taken any prescription medications in the past year and continues to  smoke 2 to 3 packs of cigarettes daily.  He said he does take aspirin 81 mg.  It appears he took Tums and sublingual nitroglycerin at home with no significant symptom improvement.  He was given Reglan and IV Zofran at that time pain had resolved.  He told me this morning that he still has some abdominal pain and nausea.  Troponins are normal and ECG is without acute ischemic changes.  Overall symptoms appear atypical for a cardiac etiology and appear to be gastrointestinal in nature.  An echocardiogram has just been performed which will be reviewed to see if there are new changes in cardiac structure and function.  LDL 94 and not at goal and thus will start atorvastatin 20 mg.   Prentice Docker, MD, [facility]  08/14/2018 10:04 AM

## 2018-08-14 NOTE — Discharge Instructions (Signed)
Please follow up with GI to arrange for outpatient EGD  Please follow up with cardiology to discuss echocardiogram results Please stop smoking.     Gastroesophageal Reflux Disease, Adult Normally, food travels down the esophagus and stays in the stomach to be digested. If a person has gastroesophageal reflux disease (GERD), food and stomach acid move back up into the esophagus. When this happens, the esophagus becomes sore and swollen (inflamed). Over time, GERD can make small holes (ulcers) in the lining of the esophagus. Follow these instructions at home: Diet  Follow a diet as told by your doctor. You may need to avoid foods and drinks such as: ? Coffee and tea (with or without caffeine). ? Drinks that contain alcohol. ? Energy drinks and sports drinks. ? Carbonated drinks or sodas. ? Chocolate and cocoa. ? Peppermint and mint flavorings. ? Garlic and onions. ? Horseradish. ? Spicy and acidic foods, such as peppers, chili powder, curry powder, vinegar, hot sauces, and BBQ sauce. ? Citrus fruit juices and citrus fruits, such as oranges, lemons, and limes. ? Tomato-based foods, such as red sauce, chili, salsa, and pizza with red sauce. ? Fried and fatty foods, such as donuts, french fries, potato chips, and high-fat dressings. ? High-fat meats, such as hot dogs, rib eye steak, sausage, ham, and bacon. ? High-fat dairy items, such as whole milk, butter, and cream cheese.  Eat small meals often. Avoid eating large meals.  Avoid drinking large amounts of liquid with your meals.  Avoid eating meals during the 2-3 hours before bedtime.  Avoid lying down right after you eat.  Do not exercise right after you eat. General instructions  Pay attention to any changes in your symptoms.  Take over-the-counter and prescription medicines only as told by your doctor. Do not take aspirin, ibuprofen, or other NSAIDs unless your doctor says it is okay.  Do not use any tobacco products,  including cigarettes, chewing tobacco, and e-cigarettes. If you need help quitting, ask your doctor.  Wear loose clothes. Do not wear anything tight around your waist.  Raise (elevate) the head of your bed about 6 inches (15 cm).  Try to lower your stress. If you need help doing this, ask your doctor.  If you are overweight, lose an amount of weight that is healthy for you. Ask your doctor about a safe weight loss goal.  Keep all follow-up visits as told by your doctor. This is important. Contact a doctor if:  You have new symptoms.  You lose weight and you do not know why it is happening.  You have trouble swallowing, or it hurts to swallow.  You have wheezing or a cough that keeps happening.  Your symptoms do not get better with treatment.  You have a hoarse voice. Get help right away if:  You have pain in your arms, neck, jaw, teeth, or back.  You feel sweaty, dizzy, or light-headed.  You have chest pain or shortness of breath.  You throw up (vomit) and your throw up looks like blood or coffee grounds.  You pass out (faint).  Your poop (stool) is bloody or black.  You cannot swallow, drink, or eat. This information is not intended to replace advice given to you by your health care provider. Make sure you discuss any questions you have with your health care provider. Document Released: 03/25/2008 Document Revised: 03/14/2016 Document Reviewed: 02/01/2015 Elsevier Interactive Patient Education  2018 [employer]. Food Choices for Gastroesophageal Reflux Disease, Adult When you have  gastroesophageal reflux disease (GERD), the foods you eat and your eating habits are very important. Choosing the right foods can help ease your discomfort. What guidelines do I need to follow?  Choose fruits, vegetables, whole grains, and low-fat dairy products.  Choose low-fat meat, fish, and poultry.  Limit fats such as oils, salad dressings, butter, nuts, and avocado.  Keep a food  diary. This helps you identify foods that cause symptoms.  Avoid foods that cause symptoms. These may be different for everyone.  Eat small meals often instead of 3 large meals a day.  Eat your meals slowly, in a place where you are relaxed.  Limit fried foods.  Cook foods using methods other than frying.  Avoid drinking alcohol.  Avoid drinking large amounts of liquids with your meals.  Avoid bending over or lying down until 2-3 hours after eating. What foods are not recommended? These are some foods and drinks that may make your symptoms worse: Vegetables Tomatoes. Tomato juice. Tomato and spaghetti sauce. Chili peppers. Onion and garlic. Horseradish. Fruits Oranges, grapefruit, and lemon (fruit and juice). Meats High-fat meats, fish, and poultry. This includes hot dogs, ribs, ham, sausage, salami, and bacon. Dairy Whole milk and chocolate milk. Sour cream. Cream. Butter. Ice cream. Cream cheese. Drinks Coffee and tea. Bubbly (carbonated) drinks or energy drinks. Condiments Hot sauce. Barbecue sauce. Sweets/Desserts Chocolate and cocoa. Donuts. Peppermint and spearmint. Fats and Oils High-fat foods. This includes [place] fries and potato chips. Other Vinegar. Strong spices. This includes black pepper, white pepper, red pepper, cayenne, curry powder, cloves, ginger, and chili powder. The items listed above may not be a complete list of foods and drinks to avoid. Contact your dietitian for more information. This information is not intended to replace advice given to you by your health care provider. Make sure you discuss any questions you have with your health care provider. Document Released: 04/07/2012 Document Revised: 03/14/2016 Document Reviewed: 08/11/2013 Elsevier Interactive Patient Education  2017 [employer].   Steps to Quit Smoking Smoking tobacco can be harmful to your health and can affect almost every organ in your body. Smoking puts you, and those around  you, at risk for developing many serious chronic diseases. Quitting smoking is difficult, but it is one of the best things that you can do for your health. It is never too late to quit. What are the benefits of quitting smoking? When you quit smoking, you lower your risk of developing serious diseases and conditions, such as:  Lung cancer or lung disease, such as COPD.  Heart disease.  Stroke.  Heart attack.  Infertility.  Osteoporosis and bone fractures.  Additionally, symptoms such as coughing, wheezing, and shortness of breath may get better when you quit. You may also find that you get sick less often because your body is stronger at fighting off colds and infections. If you are pregnant, quitting smoking can help to reduce your chances of having a baby of low birth weight. How do I get ready to quit? When you decide to quit smoking, create a plan to make sure that you are successful. Before you quit:  Pick a date to quit. Set a date within the next two weeks to give you time to prepare.  Write down the reasons why you are quitting. Keep this list in places where you will see it often, such as on your bathroom mirror or in your car or wallet.  Identify the people, places, things, and activities that make you want  to smoke (triggers) and avoid them. Make sure to take these actions: ? Throw away all cigarettes at home, at work, and in your car. ? Throw away smoking accessories, such as Set designer. ? Clean your car and make sure to empty the ashtray. ? Clean your home, including curtains and carpets.  Tell your family, friends, and coworkers that you are quitting. Support from your loved ones can make quitting easier.  Talk with your health care provider about your options for quitting smoking.  Find out what treatment options are covered by your health insurance.  What strategies can I use to quit smoking? Talk with your healthcare provider about different strategies  to quit smoking. Some strategies include:  Quitting smoking altogether instead of gradually lessening how much you smoke over a period of time. Research shows that quitting cold [place] is more successful than gradually quitting.  Attending in-person counseling to help you build problem-solving skills. You are more likely to have success in quitting if you attend several counseling sessions. Even short sessions of 10 minutes can be effective.  Finding resources and support systems that can help you to quit smoking and remain smoke-free after you quit. These resources are most helpful when you use them often. They can include: ? Online chats with a Veterinary surgeon. ? Telephone quitlines. ? Automotive engineer. ? Support groups or group counseling. ? Text messaging programs. ? Mobile phone applications.  Taking medicines to help you quit smoking. (If you are pregnant or breastfeeding, talk with your health care provider first.) Some medicines contain nicotine and some do not. Both types of medicines help with cravings, but the medicines that include nicotine help to relieve withdrawal symptoms. Your health care provider may recommend: ? Nicotine patches, gum, or lozenges. ? Nicotine inhalers or sprays. ? Non-nicotine medicine that is taken by mouth.  Talk with your health care provider about combining strategies, such as taking medicines while you are also receiving in-person counseling. Using these two strategies together makes you more likely to succeed in quitting than if you used either strategy on its own. If you are pregnant or breastfeeding, talk with your health care provider about finding counseling or other support strategies to quit smoking. Do not take medicine to help you quit smoking unless told to do so by your health care provider. What things can I do to make it easier to quit? Quitting smoking might feel overwhelming at first, but there is a lot that you can do to make it  easier. Take these important actions:  Reach out to your family and friends and ask that they support and encourage you during this time. Call telephone quitlines, reach out to support groups, or work with a counselor for support.  Ask people who smoke to avoid smoking around you.  Avoid places that trigger you to smoke, such as bars, parties, or smoke-break areas at work.  Spend time around people who do not smoke.  Lessen stress in your life, because stress can be a smoking trigger for some people. To lessen stress, try: ? Exercising regularly. ? Deep-breathing exercises. ? Yoga. ? Meditating. ? Performing a body scan. This involves closing your eyes, scanning your body from head to toe, and noticing which parts of your body are particularly tense. Purposefully relax the muscles in those areas.  Download or purchase mobile phone or tablet apps (applications) that can help you stick to your quit plan by providing reminders, tips, and encouragement. There are many free  apps, such as QuitGuide from the [employer] developer for Disease Control and Prevention). You can find other support for quitting smoking (smoking cessation) through smokefree.gov and other websites.  How will I feel when I quit smoking? Within the first 24 hours of quitting smoking, you may start to feel some withdrawal symptoms. These symptoms are usually most noticeable 2-3 days after quitting, but they usually do not last beyond 2-3 weeks. Changes or symptoms that you might experience include:  Mood swings.  Restlessness, anxiety, or irritation.  Difficulty concentrating.  Dizziness.  Strong cravings for sugary foods in addition to nicotine.  Mild weight gain.  Constipation.  Nausea.  Coughing or a sore throat.  Changes in how your medicines work in your body.  A depressed mood.  Difficulty sleeping (insomnia).  After the first 2-3 weeks of quitting, you may start to notice more positive results, such  as:  Improved sense of smell and taste.  Decreased coughing and sore throat.  Slower heart rate.  Lower blood pressure.  Clearer skin.  The ability to breathe more easily.  Fewer sick days.  Quitting smoking is very challenging for most people. Do not get discouraged if you are not successful the first time. Some people need to make many attempts to quit before they achieve long-term success. Do your best to stick to your quit plan, and talk with your health care provider if you have any questions or concerns. This information is not intended to replace advice given to you by your health care provider. Make sure you discuss any questions you have with your health care provider. Document Released: 10/01/2001 Document Revised: 06/04/2016 Document Reviewed: 02/21/2015 Elsevier Interactive Patient Education  2018 [employer].  Coping with Quitting Smoking Quitting smoking is a physical and mental challenge. You will face cravings, withdrawal symptoms, and temptation. Before quitting, work with your health care provider to make a plan that can help you cope. Preparation can help you quit and keep you from giving in. How can I cope with cravings? Cravings usually last for 5-10 minutes. If you get through it, the craving will pass. Consider taking the following actions to help you cope with cravings:  Keep your mouth busy: ? Chew sugar-free gum. ? Suck on hard candies or a straw. ? Brush your teeth.  Keep your hands and body busy: ? Immediately change to a different activity when you feel a craving. ? Squeeze or play with a ball. ? Do an activity or a hobby, like making bead jewelry, practicing needlepoint, or working with wood. ? Mix up your normal routine. ? Take a short exercise break. Go for a quick walk or run up and down stairs. ? Spend time in public places where smoking is not allowed.  Focus on doing something kind or helpful for someone else.  Call a friend or family  member to talk during a craving.  Join a support group.  Call a quit line, such as 1-800-QUIT-NOW.  Talk with your health care provider about medicines that might help you cope with cravings and make quitting easier for you.  How can I deal with withdrawal symptoms? Your body may experience negative effects as it tries to get used to not having nicotine in the system. These effects are called withdrawal symptoms. They may include:  Feeling hungrier than normal.  Trouble concentrating.  Irritability.  Trouble sleeping.  Feeling depressed.  Restlessness and agitation.  Craving a cigarette.  To manage withdrawal symptoms:  Avoid places, people,  and activities that trigger your cravings.  Remember why you want to quit.  Get plenty of sleep.  Avoid coffee and other caffeinated drinks. These may worsen some of your symptoms.  How can I handle social situations? Social situations can be difficult when you are quitting smoking, especially in the first few weeks. To manage this, you can:  Avoid parties, bars, and other social situations where people might be smoking.  Avoid alcohol.  Leave right away if you have the urge to smoke.  Explain to your family and friends that you are quitting smoking. Ask for understanding and support.  Plan activities with friends or family where smoking is not an option.  What are some ways I can cope with stress? Wanting to smoke may cause stress, and stress can make you want to smoke. Find ways to manage your stress. Relaxation techniques can help. For example:  Breathe slowly and deeply, in through your nose and out through your mouth.  Listen to soothing, relaxing music.  Talk with a family member or friend about your stress.  Light a candle.  Soak in a bath or take a shower.  Think about a peaceful place.  What are some ways I can prevent weight gain? Be aware that many people gain weight after they quit smoking. However, not  everyone does. To keep from gaining weight, have a plan in place before you quit and stick to the plan after you quit. Your plan should include:  Having healthy snacks. When you have a craving, it may help to: ? Eat plain popcorn, crunchy carrots, celery, or other cut vegetables. ? Chew sugar-free gum.  Changing how you eat: ? Eat small portion sizes at meals. ? Eat 4-6 small meals throughout the day instead of 1-2 large meals a day. ? Be mindful when you eat. Do not watch television or do other things that might distract you as you eat.  Exercising regularly: ? Make time to exercise each day. If you do not have time for a long workout, do short bouts of exercise for 5-10 minutes several times a day. ? Do some form of strengthening exercise, like weight lifting, and some form of aerobic exercise, like running or swimming.  Drinking plenty of water or other low-calorie or no-calorie drinks. Drink 6-8 glasses of water daily, or as much as instructed by your health care provider.  Summary  Quitting smoking is a physical and mental challenge. You will face cravings, withdrawal symptoms, and temptation to smoke again. Preparation can help you as you go through these challenges.  You can cope with cravings by keeping your mouth busy (such as by chewing gum), keeping your body and hands busy, and making calls to family, friends, or a helpline for people who want to quit smoking.  You can cope with withdrawal symptoms by avoiding places where people smoke, avoiding drinks with caffeine, and getting plenty of rest.  Ask your health care provider about the different ways to prevent weight gain, avoid stress, and handle social situations. This information is not intended to replace advice given to you by your health care provider. Make sure you discuss any questions you have with your health care provider. Document Released: 10/04/2016 Document Revised: 10/04/2016 Document Reviewed:  10/04/2016 Elsevier Interactive Patient Education  [employer].

## 2018-08-14 NOTE — Care Management Obs Status (Signed)
MEDICARE OBSERVATION STATUS NOTIFICATION   Patient Details  Name: [name] MRN: [id number] Date of Birth: 10/25/1950   Medicare Observation Status Notification Given:  Yes    Renie Ora 08/14/2018, 1:51 PM

## 2018-08-14 NOTE — Discharge Summary (Addendum)
Physician Discharge Summary  [name] ZOX:[id number] DOB: 26-Oct-1950 DOA: 08/13/2018  PCP: [facility] GI: [place]  Admit date: 08/13/2018 Discharge date: 08/14/2018  Admitted From: Home  Disposition: Home   Recommendations for Outpatient Follow-up:  1. Follow up with PCP in 2 weeks 2. Please follow up with GI in 2-3 weeks to arrange outpatient EGD  Discharge Condition: STABLE   CODE STATUS: FULL    Brief Hospitalization Summary: Please see all hospital notes, images, labs for full details of the hospitalization.  HPI:  HPI from Dr Ortencia Kick  is a 67 y.o. male, w CAD s/p CABG, AAA, Copd, apparently c/o nausea for the past 1-2 weeks and chest pain for the past 2 days.  Substernal , burning per patient, without radiation.  Pt denies fever, chills, cough, palp, sob, abd pain, diarrhea, brbpr, black stool, dysuria.  Pt states tried otc mylanta, peptobismol, pepcid without relief.  Pt took nitro without relief.   Pt presented due to persistence of chest pain.   In Ed, T 97.8  P 58  Bp 104/57  Pox 95% on RA  CT chest/ Abd pelvis IMPRESSION: 1. Stable 4.1 cm infrarenal abdominal aortic aneurysm with extensive mural thrombus. 2. No thoracic aneurysm or dissection. 3. Enlarging superior segment right lower lobe pulmonary nodule. PET-CT may be helpful for further evaluation. 4. Severe emphysema and pulmonary scarring.  Na 137, IK 4.4, Bun 17, Creatinine 1.02 Ast 21, Alt 10 Wbc 8.0, Hgb 12.9, Plt 167 MCV 106  Trop <0.03  ekg  nsr at 60, nl axis, q in v1-3  Pt will be admitted for w/up of chest pain.   Brief Admission Hx: 67 year old male with known coronary artery disease status post CABG, AAA, COPD presented with 2 weeks of ongoing nausea and acid reflux symptoms followed by chest pain for the past 2 days.  He has had substernal burning central chest pain with no radiation.  He has been taking over-the-counter Mylanta, Pepcid and  Pepto-Bismol with minimal relief.  Nitroglycerin also did not help with chest pain symptoms.  MDM/Assessment & Plan:   1. Atypical chest pain symptoms- patient currently not having pain.  His cardiac troponins have been negative x3.  An echocardiogram was done with reports of EF45-50% with diffuse hypokinesis see full report below. Pt advised to follow up with cardiology outpatient to discuss echo results.  Pt advised to stop smoking.  Lipid panel shows an LDL of 94.  Total cholesterol is 145.  Pt started on atorvastatin 20 mg daily.  2. Coronary artery disease status post CABG- he has been managed by cardiology medically, last cath was in 2018.  Cardiology consult was requested on admission.  They felt that his symptoms were not cardiac and were GI related.  Pt feeling better after protonix has been started.  Troponins negative x 4. 3. GERD-patient reports longstanding acid reflux symptoms not controlled with over-the-counter medications.  He has been started on PPI this admission.  GI consultation has been requested and recommending BID PPI and outpatient EGD. 4. COPD-resumed home medications and monitor.  Currently stable. 5. Generalized anxiety disorder-alprazolam as needed ordered. 6. Hypotension- likely this is chronic as patient is asymptomatic, following closely.  Holding antihypertensives. 7. Pulmonary nodule- would recommend outpatient pulmonary follow-up.  Pt advised to follow up with Dr. Juanetta Gosling in 1 month.   8. Tobacco abuse - Pt strongly advised to stop smoking.  Pt not interested in stopping at this time. Written information regarding  tobacco cessation given at discharge.   DVT prophylaxis: Lovenox/SCDs Code Status: Full Family Communication: Patient updated at bedside  Consultants:  Cardiology  Gastroenterology  Procedures:  Echocardiogram 08/14/18 Study Conclusions  - Left ventricle: The cavity size was normal. Wall thickness was   normal. Systolic function was  mildly reduced. The estimated   ejection fraction was in the range of 45% to 50%. Diffuse   hypokinesis. Doppler parameters are consistent with restrictive   physiology, indicative of decreased left ventricular diastolic   compliance and/or increased left atrial pressure. Doppler   parameters are consistent with indeterminate ventricular filling   pressure. - Regional wall motion abnormality: Hypokinesis of the mid   anterior, mid anteroseptal, and mid anterolateral myocardium. - Aortic valve: Mildly thickened, moderately calcified leaflets.   Morphologically, there was at least mild if not mild to moderate   calcific aortic stenosis. - Mitral valve: Mildly calcified annulus. Normal thickness leaflets   . There was mild regurgitation. Valve area by pressure half-time:   2.27 cm^2. - Right ventricle: Systolic function was mildly reduced. 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 There was no significant   regurgitation. - Systemic veins: The IVC is dilated with normal respiratory   variation. Estimated right atrial pressure is 8 mmHg.  Antimicrobials:  N/A Discharge Diagnoses:  Active Problems:   AAA (abdominal aortic aneurysm) without rupture (HCC)   Chest pain   Coronary artery disease involving native heart with angina pectoris (HCC)   COPD (chronic obstructive pulmonary disease) (HCC)   Pulmonary nodule   Unstable angina (HCC)   Gastroesophageal reflux disease   Nausea without vomiting  Discharge Instructions: Discharge Instructions    Call MD for:  difficulty breathing, headache or visual disturbances   Complete by:  As directed    Call MD for:  extreme fatigue   Complete by:  As directed    Call MD for:  persistant dizziness or light-headedness   Complete by:  As directed    Call MD for:  severe uncontrolled pain   Complete by:  As directed    Increase activity slowly   Complete by:  As directed       Allergies as of 08/14/2018   No Known Allergies     Medication List    STOP taking these medications   azithromycin 250 MG tablet Commonly known as:  ZITHROMAX     TAKE these medications   ALPRAZolam 1 MG tablet Commonly known as:  XANAX Take 1 mg by mouth 4 (four) times daily as needed for anxiety.   aspirin EC 81 MG tablet Take 81 mg by mouth daily.   atorvastatin 20 MG tablet Commonly known as:  LIPITOR Take 1 tablet (20 mg total) by mouth daily at 6 PM.   HYDROcodone-acetaminophen 7.5-325 MG tablet Commonly known as:  NORCO Take 1 tablet by mouth every 6 (six) hours as needed for moderate pain.   nitroGLYCERIN 0.4 MG SL tablet Commonly known as:  NITROSTAT PLACE 1 TABLET UNDER THE TONGUE EVERY 5 MINUTES AS NEEDED FOR CHEST PAIN What changed:  See the new instructions.   pantoprazole 40 MG tablet Commonly known as:  PROTONIX Take 1 tablet (40 mg total) by mouth 2 (two) times daily before a meal.   promethazine 25 MG tablet Commonly known as:  PHENERGAN Take 25 mg by mouth every 6 (six) hours as needed for nausea or vomiting.   zolpidem 10  MG tablet Commonly known as:  AMBIEN Take 10 mg by mouth at bedtime as needed for sleep.      Follow-up Information    [facility] [employer] [facility]. Schedule an appointment as soon as possible for a visit in 2 week(s).   Specialty:  Family Medicine Contact information: [address] [place] [address] [phone]        Laqueta Linden, MD. Schedule an appointment as soon as possible for a visit in 3 week(s).   Specialty:  Cardiology Why:  Hospital Follow Up  Contact information: [address] [place] [address] [phone]        West Bali, MD. Schedule an appointment as soon as possible for a visit in 2 week(s).   Specialty:  Gastroenterology Why:  Hospital Follow Up for outpatient EGD Contact information: [address] [place] [address] [phone]         Kari Baars, MD. Schedule an appointment as soon as possible for a visit in 1 month(s).   Specialty:  Pulmonary Disease Why:  Hospital Follow Up for pulmonary nodule Contact information: [address] [place] [address] [phone]          No Known Allergies Allergies as of 08/14/2018   No Known Allergies     Medication List    STOP taking these medications   azithromycin 250 MG tablet Commonly known as:  ZITHROMAX     TAKE these medications   ALPRAZolam 1 MG tablet Commonly known as:  XANAX Take 1 mg by mouth 4 (four) times daily as needed for anxiety.   aspirin EC 81 MG tablet Take 81 mg by mouth daily.   atorvastatin 20 MG tablet Commonly known as:  LIPITOR Take 1 tablet (20 mg total) by mouth daily at 6 PM.   HYDROcodone-acetaminophen 7.5-325 MG tablet Commonly known as:  NORCO Take 1 tablet by mouth every 6 (six) hours as needed for moderate pain.   nitroGLYCERIN 0.4 MG SL tablet Commonly known as:  NITROSTAT PLACE 1 TABLET UNDER THE TONGUE EVERY 5 MINUTES AS NEEDED FOR CHEST PAIN What changed:  See the new instructions.   pantoprazole 40 MG tablet Commonly known as:  PROTONIX Take 1 tablet (40 mg total) by mouth 2 (two) times daily before a meal.   promethazine 25 MG tablet Commonly known as:  PHENERGAN Take 25 mg by mouth every 6 (six) hours as needed for nausea or vomiting.   zolpidem 10 MG tablet Commonly known as:  AMBIEN Take 10 mg by mouth at bedtime as needed for sleep.       Procedures/Studies: Ct Angio Chest/abd/pel For Dissection W And/or Wo Contrast  Result Date: 08/13/2018 CLINICAL DATA:  Several day history of chest pain. EXAM: CT ANGIOGRAPHY CHEST, ABDOMEN AND PELVIS TECHNIQUE: Multidetector CT imaging through the chest, abdomen and pelvis was performed using the standard protocol during bolus administration of intravenous contrast. Multiplanar reconstructed images and MIPs were obtained and reviewed to  evaluate the vascular anatomy. CONTRAST:  ISOVUE-370 IOPAMIDOL (ISOVUE-370) INJECTION 76% COMPARISON:  05/23/2017 FINDINGS: CTA CHEST FINDINGS Cardiovascular: The heart is normal in size. No pericardial effusion. The aorta is normal in caliber. No dissection. The branch vessels are patent. Scattered atherosclerotic calcifications. Three-vessel coronary artery calcifications and surgical changes from bypass surgery. Mediastinum/Nodes: Stable mediastinal and hilar lymph nodes. No new or progressive findings. The esophagus is grossly normal. Lungs/Pleura: Severe emphysema and pulmonary scarring. Enlarging superior segment right lower lobe  pulmonary nodule adjacent to the major fissure. It measures 11 x 11 mm and previously measured 9.5 x 9.5 mm. Stable calcified granulomas in the right middle lobe. Musculoskeletal: No significant bony findings. Review of the MIP images confirms the above findings. CTA ABDOMEN AND PELVIS FINDINGS VASCULAR Aorta: Infrarenal abdominal aortic aneurysm measuring a maximum of 4.1 cm. This is stable when compared to the prior CT scan. Extensive mural thrombus. This extends right down to the iliac artery bifurcation and there is mild aneurysmal dilatation of both common iliac arteries and extensive atherosclerotic calcifications. Celiac: Patent. SMA: Patent. Renals: Calcification along the proximal right renal artery but no significant stenosis. Left renal artery is widely patent. IMA: Not identified. Inflow: Severe external and internal iliac artery atherosclerotic calcifications. Veins: Grossly normal Review of the MIP images confirms the above findings. NON-VASCULAR Hepatobiliary: No significant findings. Pancreas: No mass, inflammation or ductal dilatation. Spleen: Normal size.  No focal lesions. Adrenals/Urinary Tract: The adrenal glands and kidneys are grossly normal. Stomach/Bowel: The stomach, duodenum, small bowel and colon are grossly normal without oral contrast. No  inflammatory changes, mass lesions or obstructive findings. The terminal ileum and appendix are normal. Moderate stool throughout the colon. Lymphatic: No overt adenopathy. Reproductive: The prostate gland and seminal vesicles are grossly normal. Other: No ascites or abdominal wall hernia. Musculoskeletal: Bilateral pars defects at L5. Review of the MIP images confirms the above findings. IMPRESSION: 1. Stable 4.1 cm infrarenal abdominal aortic aneurysm with extensive mural thrombus. 2. No thoracic aneurysm or dissection. 3. Enlarging superior segment right lower lobe pulmonary nodule. PET-CT may be helpful for further evaluation. 4. Severe emphysema and pulmonary scarring. Electronically Signed   By: Rudie Meyer M.D.   On: 08/13/2018 21:16     Subjective: Pt says he feels much better and wants to go home.    Discharge Exam: Vitals:   08/14/18 1600 08/14/18 1700  BP: (!) 110/57 120/79  Pulse: 72 67  Resp:    Temp:    SpO2: 95% 97%   Vitals:   08/14/18 1400 08/14/18 1500 08/14/18 1600 08/14/18 1700  BP: 119/83 (!) 100/53 (!) 110/57 120/79  Pulse: 65 71 72 67  Resp:      Temp:      TempSrc:      SpO2: 95% 95% 95% 97%  Weight:      Height:       General: Pt is alert, awake, not in acute distress Cardiovascular: RRR, S1/S2 +, no rubs, no gallops Respiratory: CTA bilaterally, no wheezing, no rhonchi Abdominal: Soft, NT, ND, bowel sounds + Extremities: no edema, no cyanosis   The results of significant diagnostics from this hospitalization (including imaging, microbiology, ancillary and laboratory) are listed below for reference.     Microbiology: Recent Results (from the past 240 hour(s))  MRSA PCR Screening     Status: None   Collection Time: 08/13/18 11:41 PM  Result Value Ref Range Status   MRSA by PCR NEGATIVE NEGATIVE Final    Comment:        The GeneXpert MRSA Assay (FDA approved for NASAL specimens only), is one component of a comprehensive MRSA  colonization surveillance program. It is not intended to diagnose MRSA infection nor to guide or monitor treatment for MRSA infections. Performed at [facility], [address], [place], [place] [address]      Labs: BNP (last 3 results) No results for input(s): BNP in the last 8760 hours. Basic Metabolic Panel: Recent Labs  Lab 08/13/18 1900  08/14/18 0353  NA 137 139  K 4.4 4.1  CL 104 107  CO2 28 27  GLUCOSE 103* 78  BUN 17 15  CREATININE 1.02 0.81  CALCIUM 8.4* 7.9*   Liver Function Tests: Recent Labs  Lab 08/13/18 1900 08/14/18 0353  AST 21 17  ALT 10 10  ALKPHOS 60 46  BILITOT 0.6 0.7  PROT 6.5 5.7*  ALBUMIN 3.6 3.3*   Recent Labs  Lab 08/13/18 1900  LIPASE 25   No results for input(s): AMMONIA in the last 168 hours. CBC: Recent Labs  Lab 08/13/18 1900 08/14/18 0353  WBC 8.0 7.4  NEUTROABS 3.7  --   HGB 12.9* 12.3*  HCT 39.7 38.0*  MCV 106.1* 105.6*  PLT 167 144*   Cardiac Enzymes: Recent Labs  Lab 08/13/18 1900 08/13/18 2234 08/14/18 0353 08/14/18 1000  TROPONINI <0.03 <0.03 <0.03 <0.03   BNP: Invalid input(s): POCBNP CBG: No results for input(s): GLUCAP in the last 168 hours. D-Dimer No results for input(s): DDIMER in the last 72 hours. Hgb A1c No results for input(s): HGBA1C in the last 72 hours. Lipid Profile Recent Labs    08/14/18 0353  CHOL 145  HDL 33*  LDLCALC 94  TRIG 88  CHOLHDL 4.4   Thyroid function studies No results for input(s): TSH, T4TOTAL, T3FREE, THYROIDAB in the last 72 hours.  Invalid input(s): FREET3 Anemia work up No results for input(s): VITAMINB12, FOLATE, FERRITIN, TIBC, IRON, RETICCTPCT in the last 72 hours. Urinalysis    Component Value Date/Time   COLORURINE YELLOW 01/23/2014 1530   APPEARANCEUR CLEAR 01/23/2014 1530   LABSPEC 1.010 01/23/2014 1530   PHURINE 6.0 01/23/2014 1530   GLUCOSEU NEGATIVE 01/23/2014 1530   HGBUR NEGATIVE 01/23/2014 1530   BILIRUBINUR NEGATIVE 01/23/2014 1530    KETONESUR NEGATIVE 01/23/2014 1530   PROTEINUR NEGATIVE 01/23/2014 1530   UROBILINOGEN 0.2 01/23/2014 1530   NITRITE NEGATIVE 01/23/2014 1530   LEUKOCYTESUR NEGATIVE 01/23/2014 1530   Sepsis Labs Invalid input(s): PROCALCITONIN,  WBC,  LACTICIDVEN Microbiology Recent Results (from the past 240 hour(s))  MRSA PCR Screening     Status: None   Collection Time: 08/13/18 11:41 PM  Result Value Ref Range Status   MRSA by PCR NEGATIVE NEGATIVE Final    Comment:        The GeneXpert MRSA Assay (FDA approved for NASAL specimens only), is one component of a comprehensive MRSA colonization surveillance program. It is not intended to diagnose MRSA infection nor to guide or monitor treatment for MRSA infections. Performed at [facility], [address] [place], [place] [address]     Time coordinating discharge:   SIGNED:  Standley Dakins, MD  [facility] 08/14/2018, 5:14 PM Pager [phone]  If 7PM-7AM, please contact night-coverage [email or web address] Password [email or web address]

## 2018-08-14 NOTE — Progress Notes (Signed)
PROGRESS NOTE  [facility]  ZOX:[id number]  DOB: Jun 22, 1951  DOA: 08/13/2018 PCP: [facility]  Brief Admission Hx: 67 year old male with known coronary artery disease status post CABG, AAA, COPD presented with 2 weeks of ongoing nausea and acid reflux symptoms followed by chest pain for the past 2 days.  He has had substernal burning central chest pain with no radiation.  He has been taking over-the-counter Mylanta, Pepcid and Pepto-Bismol with minimal relief.  Nitroglycerin also did not help with chest pain symptoms.  MDM/Assessment & Plan:   1. Atypical chest pain symptoms- patient currently not having pain.  His cardiac troponins have been negative x3.  An echocardiogram has been ordered.  Lipid panel shows an LDL of 94.  Total cholesterol is 145.   2. Coronary artery disease status post CABG- he has been managed by cardiology medically, last cath was in 2018.  Cardiology consult was requested on admission. 3. GERD-patient reports longstanding acid reflux symptoms not controlled with over-the-counter medications.  He has been started on PPI this admission.  GI consultation has been requested. 4. COPD-resumed home medications and monitor.  Currently stable. 5. Generalized anxiety disorder-alprazolam as needed ordered. 6. Hypotension- likely this is chronic as patient is asymptomatic, following closely.  Holding antihypertensives. 7. Pulmonary nodule- would recommend outpatient pulmonary follow-up.  DVT prophylaxis: Lovenox/SCDs Code Status: Full Family Communication: Patient updated at bedside  Consultants:  Cardiology  Gastroenterology  Procedures:  TBD  Antimicrobials:  N/A  Subjective: The patient says that he is not currently having chest pain at this time.  He does have difficulty with acid reflux disease at home and has been self-medicating with over-the-counter agents but no significant relief.  Objective: Vitals:   08/14/18 0415 08/14/18  0430 08/14/18 0445 08/14/18 0500  BP: (!) 86/48 (!) 88/48 (!) 88/46 (!) 92/47  Pulse: 63 64 67 67  Resp: 18 17 16 17   Temp:      TempSrc:      SpO2: 94% 94% 93% 92%  Weight:    59 kg    Intake/Output Summary (Last 24 hours) at 08/14/2018 0612 Last data filed at 08/14/2018 0400 Gross per 24 hour  Intake 7.71 ml  Output 1000 ml  Net -992.29 ml   Filed Weights   08/13/18 1845 08/14/18 0500  Weight: 64 kg 59 kg     REVIEW OF SYSTEMS  As per history otherwise all reviewed and reported negative  Exam:  General exam: Thin male awake alert in no distress cooperative and pleasant.   Respiratory system: Clear. No increased work of breathing. Cardiovascular system: S1 & S2 heard, RRR. No JVD, murmurs, gallops, clicks or pedal edema. Gastrointestinal system: Abdomen is nondistended, soft and nontender. Normal bowel sounds heard. Central nervous system: Alert and oriented. No focal neurological deficits. Extremities: no CCE.  Data Reviewed: Basic Metabolic Panel: Recent Labs  Lab 08/13/18 1900 08/14/18 0353  NA 137 139  K 4.4 4.1  CL 104 107  CO2 28 27  GLUCOSE 103* 78  BUN 17 15  CREATININE 1.02 0.81  CALCIUM 8.4* 7.9*   Liver Function Tests: Recent Labs  Lab 08/13/18 1900 08/14/18 0353  AST 21 17  ALT 10 10  ALKPHOS 60 46  BILITOT 0.6 0.7  PROT 6.5 5.7*  ALBUMIN 3.6 3.3*   Recent Labs  Lab 08/13/18 1900  LIPASE 25   No results for input(s): AMMONIA in the last 168 hours. CBC: Recent Labs  Lab 08/13/18 1900 08/14/18 0353  WBC 8.0 7.4  NEUTROABS 3.7  --   HGB 12.9* 12.3*  HCT 39.7 38.0*  MCV 106.1* 105.6*  PLT 167 144*   Cardiac Enzymes: Recent Labs  Lab 08/13/18 1900 08/13/18 2234 08/14/18 0353  TROPONINI <0.03 <0.03 <0.03   CBG (last 3)  No results for input(s): GLUCAP in the last 72 hours. Recent Results (from the past 240 hour(s))  MRSA PCR Screening     Status: None   Collection Time: 08/13/18 11:41 PM  Result Value Ref Range  Status   MRSA by PCR NEGATIVE NEGATIVE Final    Comment:        The GeneXpert MRSA Assay (FDA approved for NASAL specimens only), is one component of a comprehensive MRSA colonization surveillance program. It is not intended to diagnose MRSA infection nor to guide or monitor treatment for MRSA infections. Performed at [facility], [address] [place], [place] [address]     Studies: Ct Angio Chest/abd/pel For Dissection W And/or Wo Contrast  Result Date: 08/13/2018 CLINICAL DATA:  Several day history of chest pain. EXAM: CT ANGIOGRAPHY CHEST, ABDOMEN AND PELVIS TECHNIQUE: Multidetector CT imaging through the chest, abdomen and pelvis was performed using the standard protocol during bolus administration of intravenous contrast. Multiplanar reconstructed images and MIPs were obtained and reviewed to evaluate the vascular anatomy. CONTRAST:  ISOVUE-370 IOPAMIDOL (ISOVUE-370) INJECTION 76% COMPARISON:  05/23/2017 FINDINGS: CTA CHEST FINDINGS Cardiovascular: The heart is normal in size. No pericardial effusion. The aorta is normal in caliber. No dissection. The branch vessels are patent. Scattered atherosclerotic calcifications. Three-vessel coronary artery calcifications and surgical changes from bypass surgery. Mediastinum/Nodes: Stable mediastinal and hilar lymph nodes. No new or progressive findings. The esophagus is grossly normal. Lungs/Pleura: Severe emphysema and pulmonary scarring. Enlarging superior segment right lower lobe pulmonary nodule adjacent to the major fissure. It measures 11 x 11 mm and previously measured 9.5 x 9.5 mm. Stable calcified granulomas in the right middle lobe. Musculoskeletal: No significant bony findings. Review of the MIP images confirms the above findings. CTA ABDOMEN AND PELVIS FINDINGS VASCULAR Aorta: Infrarenal abdominal aortic aneurysm measuring a maximum of 4.1 cm. This is stable when compared to the prior CT scan. Extensive mural thrombus. This  extends right down to the iliac artery bifurcation and there is mild aneurysmal dilatation of both common iliac arteries and extensive atherosclerotic calcifications. Celiac: Patent. SMA: Patent. Renals: Calcification along the proximal right renal artery but no significant stenosis. Left renal artery is widely patent. IMA: Not identified. Inflow: Severe external and internal iliac artery atherosclerotic calcifications. Veins: Grossly normal Review of the MIP images confirms the above findings. NON-VASCULAR Hepatobiliary: No significant findings. Pancreas: No mass, inflammation or ductal dilatation. Spleen: Normal size.  No focal lesions. Adrenals/Urinary Tract: The adrenal glands and kidneys are grossly normal. Stomach/Bowel: The stomach, duodenum, small bowel and colon are grossly normal without oral contrast. No inflammatory changes, mass lesions or obstructive findings. The terminal ileum and appendix are normal. Moderate stool throughout the colon. Lymphatic: No overt adenopathy. Reproductive: The prostate gland and seminal vesicles are grossly normal. Other: No ascites or abdominal wall hernia. Musculoskeletal: Bilateral pars defects at L5. Review of the MIP images confirms the above findings. IMPRESSION: 1. Stable 4.1 cm infrarenal abdominal aortic aneurysm with extensive mural thrombus. 2. No thoracic aneurysm or dissection. 3. Enlarging superior segment right lower lobe pulmonary nodule. PET-CT may be helpful for further evaluation. 4. Severe emphysema and pulmonary scarring. Electronically Signed   By: Orlene Plum.D.  On: 08/13/2018 21:16   Scheduled Meds: . aspirin EC  81 mg Oral Daily  . enoxaparin (LOVENOX) injection  40 mg Subcutaneous Q24H  . nicotine  21 mg Transdermal Once  . pantoprazole  40 mg Oral Daily  . sodium chloride flush  3 mL Intravenous Q12H   Continuous Infusions: . sodium chloride      Active Problems:   AAA (abdominal aortic aneurysm) without rupture (HCC)   Chest  pain   Coronary artery disease involving native heart with angina pectoris (HCC)   COPD (chronic obstructive pulmonary disease) (HCC)   Pulmonary nodule  Critical Care Time spent: 32-minutes  Standley Dakins, MD, FAAFP [facility] Pager [phone]  If 7PM-7AM, please contact night-coverage [email or web address] Password [email or web address] 08/14/2018, 6:12 AM    LOS: 0 days

## 2018-08-14 NOTE — H&P (Signed)
TRH H&P   Patient Demographics:    [name], is a 67 y.o. male  MRN: [id number]   DOB - 09/23/1951  Admit Date - 08/13/2018  Outpatient Primary MD for the patient is [facility]  Referring MD/NP/PA: Arvella Merles  Outpatient Specialists:  Dr. Purvis Sheffield  Patient coming from: home  Chief Complaint  Patient presents with  . Chest Pain      HPI:    [name]  is a 67 y.o. male, w CAD s/p CABG, AAA, Copd, apparently c/o nausea for the past 1-2 weeks and chest pain for the past 2 days.  Substernal , burning per patient, without radiation.  Pt denies fever, chills, cough, palp, sob, abd pain, diarrhea, brbpr, black stool, dysuria.  Pt states tried otc mylanta, peptobismol, pepcid without relief.  Pt took nitro without relief.   Pt presented due to persistence of chest pain.   In Ed,  T 97.8  P 58  Bp 104/57  Pox 95% on RA  CT chest/ Abd pelvis IMPRESSION: 1. Stable 4.1 cm infrarenal abdominal aortic aneurysm with extensive mural thrombus. 2. No thoracic aneurysm or dissection. 3. Enlarging superior segment right lower lobe pulmonary nodule. PET-CT may be helpful for further evaluation. 4. Severe emphysema and pulmonary scarring.  Na 137, IK 4.4, Bun 17, Creatinine 1.02 Ast 21, Alt 10 Wbc 8.0, Hgb 12.9, Plt 167 MCV 106  Trop <0.03  ekg  nsr at 60, nl axis, q in v1-3  Pt will be admitted for w/up of chest pain.      Review of systems:    In addition to the HPI above,    No Fever-chills, No Headache, No changes with Vision or hearing, No problems swallowing food or Liquids, No Cough or Shortness of Breath, No Abdominal pain, No Nausea or Vommitting, Bowel movements are regular, No Blood in stool or Urine, No dysuria, No new skin rashes or bruises, No new joints pains-aches,  No new weakness, tingling, numbness in any  extremity, No recent weight gain or loss, No polyuria, polydypsia or polyphagia, No significant Mental Stressors.  A full 10 point Review of Systems was done, except as stated above, all other Review of Systems were negative.   With Past History of the following :    Past Medical History:  Diagnosis Date  . AAA (abdominal aortic aneurysm) (HCC)   . Anxiety   . Arthritis   . COPD (chronic obstructive pulmonary disease) (HCC)   . 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 MI (myocardial infarction) (HCC)   . Pulmonary nodule    benign      Past Surgical History:  Procedure Laterality Date  . CARDIAC CATHETERIZATION  01/2015   Coronary angiography on 02/17/15  demonstrated the left main coronary artery to have mild 30-40% stenosis, 100% LAD stenosis with right to left collaterals and faint left to left collaterals, small nondominant left circumflex, small to medium caliber first obtuse marginal branch with 20% ostial and proximal stenosis. The RCA was noted to have diffuse disease.  Marland Kitchen CARDIAC CATHETERIZATION N/A 11/11/2016   Procedure: Left Heart Cath and Cors/Grafts Angiography;  Surgeon: Marykay Lex, MD;  Location: [facility] INVASIVE CV LAB;  Service: Cardiovascular;  Laterality: N/A;  . CORONARY ARTERY BYPASS GRAFT  01/2015  . INGUINAL HERNIA REPAIR Bilateral   . VALVE REPLACEMENT  01/2015   Tricuspid Valve Replacement      Social History:     Social History   Tobacco Use  . Smoking status: Current Every Day Smoker    Packs/day: 3.00    Years: 60.00    Pack years: 180.00    Types: Cigarettes    Last attempt to quit: 02/11/2015    Years since quitting: 3.5  . Smokeless tobacco: Never Used  Substance Use Topics  . Alcohol use: Not Currently    Alcohol/week: 1.0 standard drinks    Types: 1 Cans of beer per week    Comment: one beer a week       Lives - at home  Mobility - walks by self   Family History :     Family History  Problem Relation Age of Onset  . Heart disease Father   . Heart attack Sister        Died in her 47s  . CAD Brother   . Heart attack Brother   . CAD Brother   . Heart attack Brother        Home Medications:   Prior to Admission medications   Medication Sig Start Date End Date Taking? Authorizing Provider  ALPRAZolam Prudy Feeler) 1 MG tablet Take 1 mg by mouth 4 (four) times daily as needed for anxiety.  09/17/16  Yes [provider]  aspirin EC 81 MG tablet Take 81 mg by mouth daily.    Yes [provider]  HYDROcodone-acetaminophen (NORCO) 7.5-325 MG tablet Take 1 tablet by mouth every 6 (six) hours as needed for moderate pain.  08/16/16  Yes [provider]  nitroGLYCERIN (NITROSTAT) 0.4 MG SL tablet PLACE 1 TABLET UNDER THE TONGUE EVERY 5 MINUTES AS NEEDED FOR CHEST PAIN Patient taking differently: Place 0.4 mg under the tongue every 5 (five) minutes as needed for chest pain.  02/11/18  Yes Jodelle Gross, NP  promethazine (PHENERGAN) 25 MG tablet Take 25 mg by mouth every 6 (six) hours as needed for nausea or vomiting.  08/03/18  Yes [provider]  zolpidem (AMBIEN) 10 MG tablet Take 10 mg by mouth at bedtime as needed for sleep.  07/27/18  Yes [provider]  azithromycin (ZITHROMAX) 250 MG tablet Take 250-500 mg by mouth daily. Take two tablets (500mg  total) on day 1, then take one tablet (250mg  total) on days 2 through 5. Starting on 08/07/2018    [provider]     Allergies:    No Known Allergies   Physical Exam:   Vitals  Blood pressure (!) 104/57, pulse (!) 58, temperature 97.8 F (36.6 C), temperature source Oral, resp. rate 11, weight 64 kg, SpO2 95 %.   1. General  lying in bed in NAD,   2. Normal affect and insight, Not Suicidal or Homicidal, Awake Alert, Oriented X 3.  3. No F.N deficits, ALL  C.Nerves Intact,  Strength 5/5 all 4 extremities, Sensation intact all 4 extremities, Plantars down going.  4. Ears and Eyes appear Normal, Conjunctivae clear, PERRLA. Moist Oral Mucosa.  5. Supple Neck, No JVD, No cervical lymphadenopathy appriciated, No Carotid Bruits.  6. Symmetrical Chest wall movement, Good air movement bilaterally, CTAB.  7. RRR, No Gallops, Rubs or Murmurs, No Parasternal Heave.  Slight tenderness with palpation over the chest  8. Positive Bowel Sounds, Abdomen Soft, No tenderness, No organomegaly appriciated,No rebound -guarding or rigidity.  9.  No Cyanosis, Normal Skin Turgor, No Skin Rash or Bruise.  10. Good muscle tone,  joints appear normal , no effusions, Normal ROM.  11. No Palpable Lymph Nodes in Neck or Axillae     Data Review:    CBC Recent Labs  Lab 08/13/18 1900  WBC 8.0  HGB 12.9*  HCT 39.7  PLT 167  MCV 106.1*  MCH 34.5*  MCHC 32.5  RDW 13.2  LYMPHSABS 3.3  MONOABS 0.8  EOSABS 0.1  BASOSABS 0.0   ------------------------------------------------------------------------------------------------------------------  Chemistries  Recent Labs  Lab 08/13/18 1900  NA 137  K 4.4  CL 104  CO2 28  GLUCOSE 103*  BUN 17  CREATININE 1.02  CALCIUM 8.4*  AST 21  ALT 10  ALKPHOS 60  BILITOT 0.6   ------------------------------------------------------------------------------------------------------------------ CrCl cannot be calculated (Unknown ideal weight.). ------------------------------------------------------------------------------------------------------------------ No results for input(s): TSH, T4TOTAL, T3FREE, THYROIDAB in the last 72 hours.  Invalid input(s): FREET3  Coagulation profile No results for input(s): INR, PROTIME in the last 168 hours. ------------------------------------------------------------------------------------------------------------------- No results for input(s): DDIMER in the last 72  hours. -------------------------------------------------------------------------------------------------------------------  Cardiac Enzymes Recent Labs  Lab 08/13/18 1900 08/13/18 2234  TROPONINI <0.03 <0.03   ------------------------------------------------------------------------------------------------------------------ No results found for: BNP   ---------------------------------------------------------------------------------------------------------------  Urinalysis    Component Value Date/Time   COLORURINE YELLOW 01/23/2014 1530   APPEARANCEUR CLEAR 01/23/2014 1530   LABSPEC 1.010 01/23/2014 1530   PHURINE 6.0 01/23/2014 1530   GLUCOSEU NEGATIVE 01/23/2014 1530   HGBUR NEGATIVE 01/23/2014 1530   BILIRUBINUR NEGATIVE 01/23/2014 1530   KETONESUR NEGATIVE 01/23/2014 1530   PROTEINUR NEGATIVE 01/23/2014 1530   UROBILINOGEN 0.2 01/23/2014 1530   NITRITE NEGATIVE 01/23/2014 1530   LEUKOCYTESUR NEGATIVE 01/23/2014 1530    ----------------------------------------------------------------------------------------------------------------   Imaging Results:    Ct Angio Chest/abd/pel For Dissection W And/or Wo Contrast  Result Date: 08/13/2018 CLINICAL DATA:  Several day history of chest pain. EXAM: CT ANGIOGRAPHY CHEST, ABDOMEN AND PELVIS TECHNIQUE: Multidetector CT imaging through the chest, abdomen and pelvis was performed using the standard protocol during bolus administration of intravenous contrast. Multiplanar reconstructed images and MIPs were obtained and reviewed to evaluate the vascular anatomy. CONTRAST:  ISOVUE-370 IOPAMIDOL (ISOVUE-370) INJECTION 76% COMPARISON:  05/23/2017 FINDINGS: CTA CHEST FINDINGS Cardiovascular: The heart is normal in size. No pericardial effusion. The aorta is normal in caliber. No dissection. The branch vessels are patent. Scattered atherosclerotic calcifications. Three-vessel coronary artery calcifications and surgical changes from bypass  surgery. Mediastinum/Nodes: Stable mediastinal and hilar lymph nodes. No new or progressive findings. The esophagus is grossly normal. Lungs/Pleura: Severe emphysema and pulmonary scarring. Enlarging superior segment right lower lobe pulmonary nodule adjacent to the major fissure. It measures 11 x 11 mm and previously measured 9.5 x 9.5 mm. Stable calcified granulomas in the right middle lobe. Musculoskeletal: No significant bony findings. Review of the MIP images confirms the above findings. CTA ABDOMEN AND PELVIS FINDINGS VASCULAR Aorta: Infrarenal abdominal aortic aneurysm measuring a maximum of  4.1 cm. This is stable when compared to the prior CT scan. Extensive mural thrombus. This extends right down to the iliac artery bifurcation and there is mild aneurysmal dilatation of both common iliac arteries and extensive atherosclerotic calcifications. Celiac: Patent. SMA: Patent. Renals: Calcification along the proximal right renal artery but no significant stenosis. Left renal artery is widely patent. IMA: Not identified. Inflow: Severe external and internal iliac artery atherosclerotic calcifications. Veins: Grossly normal Review of the MIP images confirms the above findings. NON-VASCULAR Hepatobiliary: No significant findings. Pancreas: No mass, inflammation or ductal dilatation. Spleen: Normal size.  No focal lesions. Adrenals/Urinary Tract: The adrenal glands and kidneys are grossly normal. Stomach/Bowel: The stomach, duodenum, small bowel and colon are grossly normal without oral contrast. No inflammatory changes, mass lesions or obstructive findings. The terminal ileum and appendix are normal. Moderate stool throughout the colon. Lymphatic: No overt adenopathy. Reproductive: The prostate gland and seminal vesicles are grossly normal. Other: No ascites or abdominal wall hernia. Musculoskeletal: Bilateral pars defects at L5. Review of the MIP images confirms the above findings. IMPRESSION: 1. Stable 4.1 cm  infrarenal abdominal aortic aneurysm with extensive mural thrombus. 2. No thoracic aneurysm or dissection. 3. Enlarging superior segment right lower lobe pulmonary nodule. PET-CT may be helpful for further evaluation. 4. Severe emphysema and pulmonary scarring. Electronically Signed   By: Rudie Meyer M.D.   On: 08/13/2018 21:16       Assessment & Plan:    Active Problems:   AAA (abdominal aortic aneurysm) without rupture (HCC)   Chest pain   Coronary artery disease involving native heart with angina pectoris (HCC)   COPD (chronic obstructive pulmonary disease) (HCC)   Pulmonary nodule    Chest pain Tele Trop I q6h x3 Check cardiac echo Check lipid Cont nitro gtt started in ED Cont aspirin Start Lipitor 80mg  po qhs Start PPI Cardiology consult ordered in computer  CAD s/p CABG Last cath in 2018, medical management advocated  Copd Stable  Anxiety Cont xanax prn  Insomnia Cont ambien 5mg  po qhs prn  Pulmonary nodule Pulmonary consult placed in computer, if not pulmonary available, please have follow up as outpatient Thanks    DVT Prophylaxis Lovenox - SCDs   AM Labs Ordered, also please review Full Orders  Family Communication: Admission, patients condition and plan of care including tests being ordered have been discussed with the patient  who indicate understanding and agree with the plan and Code Status.  Code Status  FULL CODE  Likely DC to  home  Condition GUARDED   Consults called: cardiology consult placed in computer, appreciate input  Admission status: observation, pt will require evaluation of chest pain, started on iv nitro, due to hx of significant CAD, depending upon w/up results might require change to inpatient status  Time spent in minutes : 70   Pearson Grippe M.D on 08/14/2018 at 1:37 AM  Between 7am to 7pm - Pager - [phone]  . After 7pm go to [email or web address] - password [facility]  [facility] - Office  [phone]

## 2018-08-14 NOTE — Consult Note (Addendum)
Referring Provider: No ref. provider found Primary Care Physician:  [facility] Primary Gastroenterologist:  Dr. Darrick Penna (previously unassigned)  Date of Admission: 08/13/18 Date of Consultation: 08/14/18  Reason for Consultation:  GERD, nausea  HPI:  [name] is a 67 y.o. male with a past medical history of AAA, anxiety, COPD, coronary artery disease status post CABG x2, myocardial infarction, pulmonary nodule.  The patient presented to the emergency department yesterday with complaints of substernal chest pain for the previous 2 days and nausea for the previous 2 weeks.  Emergency department and hospitalist notes reviewed.  He states he has had long-standing GERD symptoms poorly controlled with over-the-counter medications.  He has never been evaluated by outpatient GI that we can see in our system.  Also with atypical chest pain and history of coronary artery disease status post CABG.  His troponins were negative x3, echocardiogram has been ordered.  Cardiology has been consulted.  On admission he was started on PPI via Protonix 40 mg once daily.  Today he states his nausea is improved.  He began having GERD symptoms 2 weeks ago and is never had this before.  Seems acute onset.  He did call his primary care with complaints of GERD symptoms and they sent in medications to his pharmacy, although he cannot member what medications.  He states these did not help.  He is also tried over-the-counter Pepto-Bismol and Maalox, which also did not help.  He denies any overt abdominal pain at this time.  Denies hematochezia, melena.  Admits esophageal/throat burning.  Denies any other GI symptoms.  Past Medical History:  Diagnosis Date  . AAA (abdominal aortic aneurysm) (HCC)   . Anxiety   . Arthritis   . COPD (chronic obstructive pulmonary disease) (HCC)   . Coronary artery disease involving native coronary artery with angina pectoris (HCC) 01/2015   100% LAD, Severe dom RCA  dz, small non-dom Cx --> CABG x 2 LIMA-LAD, SVG-RCA  . MI (myocardial infarction) (HCC)   . Pulmonary nodule    benign    Past Surgical History:  Procedure Laterality Date  . CARDIAC CATHETERIZATION  01/2015   Coronary angiography on 02/17/15 demonstrated the left main coronary artery to have mild 30-40% stenosis, 100% LAD stenosis with right to left collaterals and faint left to left collaterals, small nondominant left circumflex, small to medium caliber first obtuse marginal branch with 20% ostial and proximal stenosis. The RCA was noted to have diffuse disease.  Marland Kitchen CARDIAC CATHETERIZATION N/A 11/11/2016   Procedure: Left Heart Cath and Cors/Grafts Angiography;  Surgeon: Marykay Lex, MD;  Location: [facility] INVASIVE CV LAB;  Service: Cardiovascular;  Laterality: N/A;  . CORONARY ARTERY BYPASS GRAFT  01/2015  . INGUINAL HERNIA REPAIR Bilateral   . VALVE REPLACEMENT  01/2015   Tricuspid Valve Replacement    Prior to Admission medications   Medication Sig Start Date End Date Taking? Authorizing Provider  ALPRAZolam Prudy Feeler) 1 MG tablet Take 1 mg by mouth 4 (four) times daily as needed for anxiety.  09/17/16  Yes [provider]  aspirin EC 81 MG tablet Take 81 mg by mouth daily.    Yes [provider]  HYDROcodone-acetaminophen (NORCO) 7.5-325 MG tablet Take 1 tablet by mouth every 6 (six) hours as needed for moderate pain.  08/16/16  Yes [provider]  nitroGLYCERIN (NITROSTAT) 0.4 MG SL tablet PLACE 1 TABLET UNDER THE TONGUE EVERY 5 MINUTES AS NEEDED FOR CHEST PAIN Patient taking differently: Place  0.4 mg under the tongue every 5 (five) minutes as needed for chest pain.  02/11/18  Yes Jodelle Gross, NP  promethazine (PHENERGAN) 25 MG tablet Take 25 mg by mouth every 6 (six) hours as needed for nausea or vomiting.  08/03/18  Yes [provider]  zolpidem (AMBIEN) 10 MG tablet Take 10 mg by mouth at bedtime as needed for sleep.  07/27/18  Yes [provider]  azithromycin (ZITHROMAX) 250 MG tablet Take 250-500 mg by mouth daily. Take two tablets (500mg  total) on day 1, then take one tablet (250mg  total) on days 2 through 5. Starting on 08/07/2018    [provider]    Current Facility-Administered Medications  Medication Dose Route Frequency Provider Last Rate Last Dose  . 0.9 %  sodium chloride infusion  250 mL Intravenous PRN Pearson Grippe, MD      . acetaminophen (TYLENOL) tablet 650 mg  650 mg Oral Q6H PRN Pearson Grippe, MD       Or  . acetaminophen (TYLENOL) suppository 650 mg  650 mg Rectal Q6H PRN Pearson Grippe, MD      . ALPRAZolam Prudy Feeler) tablet 1 mg  1 mg Oral QID PRN Pearson Grippe, MD      . aspirin EC tablet 81 mg  81 mg Oral Daily Pearson Grippe, MD      . enoxaparin (LOVENOX) injection 40 mg  40 mg Subcutaneous Q24H Pearson Grippe, MD   40 mg at 08/14/18 0005  . HYDROcodone-acetaminophen (NORCO) 7.5-325 MG per tablet 1 tablet  1 tablet Oral Q6H PRN Pearson Grippe, MD      . nicotine (NICODERM CQ - dosed in mg/24 hours) patch 21 mg  21 mg Transdermal Once Doug Sou, MD   21 mg at 08/13/18 2143  . ondansetron (ZOFRAN) injection 4 mg  4 mg Intravenous Q6H PRN Pearson Grippe, MD      . pantoprazole (PROTONIX) EC tablet 40 mg  40 mg Oral Daily Pearson Grippe, MD      . sodium chloride flush (NS) 0.9 % injection 3 mL  3 mL Intravenous Q12H Pearson Grippe, MD      . sodium chloride flush (NS) 0.9 % injection 3 mL  3 mL Intravenous PRN Pearson Grippe, MD      . zolpidem Remus Loffler) tablet 5 mg  5 mg Oral QHS PRN Pearson Grippe, MD        Allergies as of 08/13/2018  . (No Known Allergies)    Family History  Problem Relation Age of Onset  . Heart disease Father   . Heart attack Sister        Died in her 106s  . CAD Brother   . Heart attack Brother   . CAD Brother   . Heart attack Brother     Social History   Socioeconomic History  . Marital status: Divorced    Spouse name: Not on file  . Number of children: Not on file  . Years of education:  Not on file  . Highest education level: Not on file  Occupational History  . Not on file  Social Needs  . Financial resource strain: Not on file  . Food insecurity:    Worry: Not on file    Inability: Not on file  . Transportation needs:    Medical: Not on file    Non-medical: Not on file  Tobacco Use  . Smoking status: Current Every Day Smoker    Packs/day: 3.00  Years: 60.00    Pack years: 180.00    Types: Cigarettes    Last attempt to quit: 02/11/2015    Years since quitting: 3.5  . Smokeless tobacco: Never Used  Substance and Sexual Activity  . Alcohol use: Not Currently    Alcohol/week: 1.0 standard drinks    Types: 1 Cans of beer per week    Comment: one beer a week  . Drug use: No  . Sexual activity: Not on file  Lifestyle  . Physical activity:    Days per week: Not on file    Minutes per session: Not on file  . Stress: Not on file  Relationships  . Social connections:    Talks on phone: Not on file    Gets together: Not on file    Attends religious service: Not on file    Active member of club or organization: Not on file    Attends meetings of clubs or organizations: Not on file    Relationship status: Not on file  . Intimate partner violence:    Fear of current or ex partner: Not on file    Emotionally abused: Not on file    Physically abused: Not on file    Forced sexual activity: Not on file  Other Topics Concern  . Not on file  Social History Narrative  . Not on file    Review of Systems: General: Negative for anorexia, weight loss, fever, chills, fatigue, weakness. ENT: Negative for hoarseness, difficulty swallowing. CV: Negative for chest pain, angina, palpitations, peripheral edema.  Respiratory: Negative for dyspnea at rest, cough, sputum, wheezing.  GI: See history of present illness. Endo: Negative for unusual weight change.  Heme: Negative for bruising or bleeding. Allergy: Negative for rash or hives.  Physical Exam: Vital signs in  last 24 hours: Temp:  [97.5 F (36.4 C)-97.8 F (36.6 C)] 97.5 F (36.4 C) (10/25 0749) Pulse Rate:  [51-75] 59 (10/25 0700) Resp:  [0-24] 17 (10/25 0700) BP: (74-149)/(38-93) 97/57 (10/25 0700) SpO2:  [90 %-100 %] 93 % (10/25 0700) Weight:  [59 kg-64 kg] 59 kg (10/25 0500) Last BM Date: 08/13/18 General:   Alert,  Well-developed, well-nourished, pleasant and cooperative in NAD Head:  Normocephalic and atraumatic. Eyes:  Sclera clear, no icterus. Conjunctiva pink. Ears:  Normal auditory acuity. Neck:  Supple; no masses or thyromegaly. Lungs:  Clear throughout to auscultation.   No wheezes, crackles, or rhonchi. No acute distress. Heart:  Regular rate and rhythm; no murmurs, clicks, rubs,  or gallops. Abdomen:  Soft, and nondistended. No TTP epigastric or abdomen. No masses, hepatosplenomegaly or hernias noted. Normal bowel sounds, without guarding, and without rebound.   Rectal:  Deferred.   Msk:  Symmetrical without gross deformities. Pulses:  Normal bilateral DP pulses noted. Extremities:  Without clubbing or edema. Neurologic:  Alert and  oriented x4;  grossly normal neurologically. Skin:  Intact without significant lesions or rashes. Psych:  Alert and cooperative. Normal mood and affect.  Intake/Output from previous day: 10/24 0701 - 10/25 0700 In: 7.7 [I.V.:7.7] Out: 1000 [Urine:1000] Intake/Output this shift: Total I/O In: 1.1 [I.V.:1.1] Out: -   Lab Results: Recent Labs    08/13/18 1900 08/14/18 0353  WBC 8.0 7.4  HGB 12.9* 12.3*  HCT 39.7 38.0*  PLT 167 144*   BMET Recent Labs    08/13/18 1900 08/14/18 0353  NA 137 139  K 4.4 4.1  CL 104 107  CO2 28 27  GLUCOSE 103* 78  BUN 17 15  CREATININE 1.02 0.81  CALCIUM 8.4* 7.9*   LFT Recent Labs    08/13/18 1900 08/14/18 0353  PROT 6.5 5.7*  ALBUMIN 3.6 3.3*  AST 21 17  ALT 10 10  ALKPHOS 60 46  BILITOT 0.6 0.7   PT/INR No results for input(s): LABPROT, INR in the last 72 hours. Hepatitis  Panel No results for input(s): HEPBSAG, HCVAB, HEPAIGM, HEPBIGM in the last 72 hours. C-Diff No results for input(s): CDIFFTOX in the last 72 hours.  Studies/Results: Ct Angio Chest/abd/pel For Dissection W And/or Wo Contrast  Result Date: 08/13/2018 CLINICAL DATA:  Several day history of chest pain. EXAM: CT ANGIOGRAPHY CHEST, ABDOMEN AND PELVIS TECHNIQUE: Multidetector CT imaging through the chest, abdomen and pelvis was performed using the standard protocol during bolus administration of intravenous contrast. Multiplanar reconstructed images and MIPs were obtained and reviewed to evaluate the vascular anatomy. CONTRAST:  ISOVUE-370 IOPAMIDOL (ISOVUE-370) INJECTION 76% COMPARISON:  05/23/2017 FINDINGS: CTA CHEST FINDINGS Cardiovascular: The heart is normal in size. No pericardial effusion. The aorta is normal in caliber. No dissection. The branch vessels are patent. Scattered atherosclerotic calcifications. Three-vessel coronary artery calcifications and surgical changes from bypass surgery. Mediastinum/Nodes: Stable mediastinal and hilar lymph nodes. No new or progressive findings. The esophagus is grossly normal. Lungs/Pleura: Severe emphysema and pulmonary scarring. Enlarging superior segment right lower lobe pulmonary nodule adjacent to the major fissure. It measures 11 x 11 mm and previously measured 9.5 x 9.5 mm. Stable calcified granulomas in the right middle lobe. Musculoskeletal: No significant bony findings. Review of the MIP images confirms the above findings. CTA ABDOMEN AND PELVIS FINDINGS VASCULAR Aorta: Infrarenal abdominal aortic aneurysm measuring a maximum of 4.1 cm. This is stable when compared to the prior CT scan. Extensive mural thrombus. This extends right down to the iliac artery bifurcation and there is mild aneurysmal dilatation of both common iliac arteries and extensive atherosclerotic calcifications. Celiac: Patent. SMA: Patent. Renals: Calcification along the proximal  right renal artery but no significant stenosis. Left renal artery is widely patent. IMA: Not identified. Inflow: Severe external and internal iliac artery atherosclerotic calcifications. Veins: Grossly normal Review of the MIP images confirms the above findings. NON-VASCULAR Hepatobiliary: No significant findings. Pancreas: No mass, inflammation or ductal dilatation. Spleen: Normal size.  No focal lesions. Adrenals/Urinary Tract: The adrenal glands and kidneys are grossly normal. Stomach/Bowel: The stomach, duodenum, small bowel and colon are grossly normal without oral contrast. No inflammatory changes, mass lesions or obstructive findings. The terminal ileum and appendix are normal. Moderate stool throughout the colon. Lymphatic: No overt adenopathy. Reproductive: The prostate gland and seminal vesicles are grossly normal. Other: No ascites or abdominal wall hernia. Musculoskeletal: Bilateral pars defects at L5. Review of the MIP images confirms the above findings. IMPRESSION: 1. Stable 4.1 cm infrarenal abdominal aortic aneurysm with extensive mural thrombus. 2. No thoracic aneurysm or dissection. 3. Enlarging superior segment right lower lobe pulmonary nodule. PET-CT may be helpful for further evaluation. 4. Severe emphysema and pulmonary scarring. Electronically Signed   By: Rudie Meyer M.D.   On: 08/13/2018 21:16    Impression: Pleasant 67 year old male with a cardiac history who was admitted to the hospital for chest pain work-up and possible acute coronary syndrome.  Cardiology is seeing him today and a doubt cardiac etiology.  He does have cardiac echo pending for wall motion abnormality or decrease in heart function which could change his care.  Acute onset of GERD symptoms about 2 weeks ago  with chest pain about 2 days ago.  He was having significant nausea, although this is improved today.  He was admitted and started on a PPI.  Overall he feels somewhat better today.  Cardiac labs normal, EKG no  acute changes.  Plan: 1. Increase Protonix to id 2. Add Carafate tid and hs 3. H. Pylori serologies 4. Prn antiemetics 5. Follow cardiac workup 6. Will eventually benefit from EGD, query IP versus OP (likely pending cardiac workup) 7. Supportive measures   Thank you for allowing [place] to participate in the care of Jaquita Folds, DNP, AGNP-C Adult & Gerontological Nurse Practitioner [facility]    LOS: 0 days     08/14/2018, 8:21 AM

## 2018-08-15 ENCOUNTER — Telehealth: Payer: Self-pay | Admitting: Gastroenterology

## 2018-08-15 LAB — HIV ANTIBODY (ROUTINE TESTING W REFLEX): HIV SCREEN 4TH GENERATION: NONREACTIVE

## 2018-08-15 NOTE — Telephone Encounter (Signed)
NEEDS EGD WITH MAC WITHIN THE NEXT 2-3 WEEKS, DX: NONCARDIAC CHEST PAIN.

## 2018-08-17 LAB — H PYLORI, IGM, IGG, IGA AB: H Pylori IgG: 0.14 Index Value (ref 0.00–0.79)

## 2018-08-17 NOTE — Telephone Encounter (Signed)
Tried to call pt, no answer, LMOVM for return call.  

## 2018-08-18 ENCOUNTER — Other Ambulatory Visit: Payer: Self-pay

## 2018-08-18 DIAGNOSIS — J449 Chronic obstructive pulmonary disease, unspecified: Secondary | ICD-10-CM | POA: Diagnosis not present

## 2018-08-18 DIAGNOSIS — I2510 Atherosclerotic heart disease of native coronary artery without angina pectoris: Secondary | ICD-10-CM | POA: Diagnosis not present

## 2018-08-18 DIAGNOSIS — R0789 Other chest pain: Secondary | ICD-10-CM

## 2018-08-18 DIAGNOSIS — J984 Other disorders of lung: Secondary | ICD-10-CM | POA: Diagnosis not present

## 2018-08-18 DIAGNOSIS — J029 Acute pharyngitis, unspecified: Secondary | ICD-10-CM | POA: Diagnosis not present

## 2018-08-18 DIAGNOSIS — I714 Abdominal aortic aneurysm, without rupture: Secondary | ICD-10-CM | POA: Diagnosis not present

## 2018-08-18 DIAGNOSIS — F419 Anxiety disorder, unspecified: Secondary | ICD-10-CM | POA: Diagnosis not present

## 2018-08-18 DIAGNOSIS — K219 Gastro-esophageal reflux disease without esophagitis: Secondary | ICD-10-CM | POA: Diagnosis not present

## 2018-08-18 DIAGNOSIS — Z6820 Body mass index (BMI) 20.0-20.9, adult: Secondary | ICD-10-CM | POA: Diagnosis not present

## 2018-08-18 NOTE — Telephone Encounter (Signed)
Called pt, EGD w/Propofol w/SLF scheduled for 09/08/18 at 2:45pm. Per endo scheduler, he will receive pre-op phone call 4-5 days prior to procedure-pt aware. Instructions mailed. Orders entered.

## 2018-08-31 NOTE — Progress Notes (Signed)
Forwarding to [place] to schedule the appt.

## 2018-09-01 ENCOUNTER — Encounter: Payer: Self-pay | Admitting: Gastroenterology

## 2018-09-01 NOTE — Progress Notes (Signed)
PATIENT SCHEDULED AND ADDED TO CANCELLATION LIST  

## 2018-09-03 ENCOUNTER — Encounter (HOSPITAL_COMMUNITY)
Admission: RE | Admit: 2018-09-03 | Discharge: 2018-09-03 | Disposition: A | Discharge status: Home / Self Care | Payer: Medicare Other | Source: Ambulatory Visit | Attending: Gastroenterology | Admitting: Gastroenterology

## 2018-09-03 ENCOUNTER — Encounter (HOSPITAL_COMMUNITY): Payer: Self-pay

## 2018-09-03 ENCOUNTER — Other Ambulatory Visit (HOSPITAL_COMMUNITY): Payer: Medicare Other

## 2018-09-03 DIAGNOSIS — Z6820 Body mass index (BMI) 20.0-20.9, adult: Secondary | ICD-10-CM | POA: Diagnosis not present

## 2018-09-03 DIAGNOSIS — Z1389 Encounter for screening for other disorder: Secondary | ICD-10-CM | POA: Diagnosis not present

## 2018-09-03 DIAGNOSIS — J449 Chronic obstructive pulmonary disease, unspecified: Secondary | ICD-10-CM | POA: Diagnosis not present

## 2018-09-03 DIAGNOSIS — G894 Chronic pain syndrome: Secondary | ICD-10-CM | POA: Diagnosis not present

## 2018-09-07 ENCOUNTER — Telehealth: Payer: Self-pay | Admitting: *Deleted

## 2018-09-07 NOTE — Telephone Encounter (Signed)
Spoke with patient. His new arrival time for procedure tomorrow 09/08/18 will now be at 12:00pm for 1:30pm procedure time. Patient aware he can have clear liquids only until 9:30am. He voiced understanding. Called [place] in Endo and made aware of change.

## 2018-09-08 ENCOUNTER — Ambulatory Visit (HOSPITAL_COMMUNITY): Payer: Medicare Other | Admitting: Anesthesiology

## 2018-09-08 ENCOUNTER — Ambulatory Visit (HOSPITAL_COMMUNITY)
Admission: RE | Admit: 2018-09-08 | Discharge: 2018-09-08 | Disposition: A | Discharge status: Home / Self Care | Payer: Medicare Other | Source: Ambulatory Visit | Attending: Gastroenterology | Admitting: Gastroenterology

## 2018-09-08 ENCOUNTER — Encounter (HOSPITAL_COMMUNITY)
Admission: RE | Disposition: A | Discharge status: Home / Self Care | Payer: Self-pay | Source: Ambulatory Visit | Attending: Gastroenterology

## 2018-09-08 ENCOUNTER — Encounter (HOSPITAL_COMMUNITY): Payer: Self-pay | Admitting: *Deleted

## 2018-09-08 DIAGNOSIS — Z951 Presence of aortocoronary bypass graft: Secondary | ICD-10-CM | POA: Insufficient documentation

## 2018-09-08 DIAGNOSIS — F419 Anxiety disorder, unspecified: Secondary | ICD-10-CM | POA: Diagnosis not present

## 2018-09-08 DIAGNOSIS — I25119 Atherosclerotic heart disease of native coronary artery with unspecified angina pectoris: Secondary | ICD-10-CM | POA: Diagnosis not present

## 2018-09-08 DIAGNOSIS — Z79899 Other long term (current) drug therapy: Secondary | ICD-10-CM | POA: Insufficient documentation

## 2018-09-08 DIAGNOSIS — M1990 Unspecified osteoarthritis, unspecified site: Secondary | ICD-10-CM | POA: Diagnosis not present

## 2018-09-08 DIAGNOSIS — K219 Gastro-esophageal reflux disease without esophagitis: Secondary | ICD-10-CM | POA: Diagnosis not present

## 2018-09-08 DIAGNOSIS — Z952 Presence of prosthetic heart valve: Secondary | ICD-10-CM | POA: Diagnosis not present

## 2018-09-08 DIAGNOSIS — Z7982 Long term (current) use of aspirin: Secondary | ICD-10-CM | POA: Diagnosis not present

## 2018-09-08 DIAGNOSIS — I513 Intracardiac thrombosis, not elsewhere classified: Secondary | ICD-10-CM | POA: Insufficient documentation

## 2018-09-08 DIAGNOSIS — I714 Abdominal aortic aneurysm, without rupture: Secondary | ICD-10-CM | POA: Diagnosis not present

## 2018-09-08 DIAGNOSIS — R911 Solitary pulmonary nodule: Secondary | ICD-10-CM | POA: Insufficient documentation

## 2018-09-08 DIAGNOSIS — J449 Chronic obstructive pulmonary disease, unspecified: Secondary | ICD-10-CM | POA: Insufficient documentation

## 2018-09-08 DIAGNOSIS — Z8249 Family history of ischemic heart disease and other diseases of the circulatory system: Secondary | ICD-10-CM | POA: Diagnosis not present

## 2018-09-08 DIAGNOSIS — K319 Disease of stomach and duodenum, unspecified: Secondary | ICD-10-CM | POA: Diagnosis not present

## 2018-09-08 DIAGNOSIS — F17210 Nicotine dependence, cigarettes, uncomplicated: Secondary | ICD-10-CM | POA: Diagnosis not present

## 2018-09-08 DIAGNOSIS — R0789 Other chest pain: Secondary | ICD-10-CM | POA: Diagnosis not present

## 2018-09-08 DIAGNOSIS — I2510 Atherosclerotic heart disease of native coronary artery without angina pectoris: Secondary | ICD-10-CM | POA: Diagnosis not present

## 2018-09-08 DIAGNOSIS — K2950 Unspecified chronic gastritis without bleeding: Secondary | ICD-10-CM | POA: Diagnosis not present

## 2018-09-08 DIAGNOSIS — K2980 Duodenitis without bleeding: Secondary | ICD-10-CM | POA: Diagnosis not present

## 2018-09-08 DIAGNOSIS — K2970 Gastritis, unspecified, without bleeding: Secondary | ICD-10-CM | POA: Diagnosis not present

## 2018-09-08 DIAGNOSIS — I252 Old myocardial infarction: Secondary | ICD-10-CM | POA: Insufficient documentation

## 2018-09-08 HISTORY — PX: ESOPHAGOGASTRODUODENOSCOPY (EGD) WITH PROPOFOL: SHX5813

## 2018-09-08 HISTORY — PX: BIOPSY: SHX5522

## 2018-09-08 SURGERY — ESOPHAGOGASTRODUODENOSCOPY (EGD) WITH PROPOFOL
Anesthesia: General

## 2018-09-08 MED ORDER — STERILE WATER FOR IRRIGATION IR SOLN
Status: DC | PRN
  Administered 2018-09-08: 1.5 mL

## 2018-09-08 MED ORDER — CHLORHEXIDINE GLUCONATE CLOTH 2 % EX PADS: 6.0000 | MEDICATED_PAD | Freq: Once | CUTANEOUS | Status: DC

## 2018-09-08 MED ORDER — KETAMINE HCL 50 MG/5ML IJ SOSY
PREFILLED_SYRINGE | INTRAMUSCULAR | Status: AC
  Filled 2018-09-08: qty 5

## 2018-09-08 MED ORDER — LIDOCAINE VISCOUS HCL 2 % MT SOLN: 10.0000 mL | Freq: Once | OROMUCOSAL | Status: DC

## 2018-09-08 MED ORDER — PROPOFOL 500 MG/50ML IV EMUL
INTRAVENOUS | Status: DC | PRN
  Administered 2018-09-08: 115 ug/kg/min via INTRAVENOUS

## 2018-09-08 MED ORDER — LACTATED RINGERS IV SOLN
INTRAVENOUS | Status: DC
  Administered 2018-09-08: 14:00:00 via INTRAVENOUS

## 2018-09-08 MED ORDER — KETAMINE HCL 10 MG/ML IJ SOLN
INTRAMUSCULAR | Status: DC | PRN
  Administered 2018-09-08: 10 mg via INTRAVENOUS

## 2018-09-08 NOTE — Anesthesia Preprocedure Evaluation (Signed)
Anesthesia Evaluation  Patient identified by MRN, date of birth, ID band Patient awake    Reviewed: Allergy & Precautions, NPO status , Patient's Chart, lab work & pertinent test results  Airway Mallampati: I  TM Distance: >3 FB Neck ROM: Full    Dental no notable dental hx. (+) Edentulous Upper, Edentulous Lower   Pulmonary COPD, Current Smoker,  Smoked today Known pulm nodule which is increasing in size  Denies using inhalers    Pulmonary exam normal breath sounds clear to auscultation       Cardiovascular Exercise Tolerance: Good + angina with exertion + CAD and + Past MI  Normal cardiovascular examI Rhythm:Regular Rate:Normal  States had open heart surg in 2016 /CABG and valve ? 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  Reports good ET Pt with known 4.1cm infrarenal AAA with mural thrombus which is being watched    Neuro/Psych Anxiety negative neurological ROS  negative psych ROS   GI/Hepatic Neg liver ROS, GERD  Medicated and Controlled,  Endo/Other  negative endocrine ROS  Renal/GU negative Renal ROS  negative genitourinary   Musculoskeletal  (+) Arthritis , Osteoarthritis,    Abdominal   Peds negative pediatric ROS (+)  Hematology negative hematology ROS (+)   Anesthesia Other Findings   Reproductive/Obstetrics negative OB ROS                             Anesthesia Physical Anesthesia Plan  ASA: IV  Anesthesia Plan: General   Post-op Pain Management:    Induction: Intravenous  PONV Risk Score and Plan:   Airway Management Planned: Nasal Cannula and Simple Face Mask  Additional Equipment:   Intra-op Plan:   Post-operative Plan:   Informed Consent: I have reviewed the patients History and Physical, chart, labs and discussed the procedure including the risks, benefits and  alternatives for the proposed anesthesia with the patient or authorized representative who has indicated his/her understanding and acceptance.   Dental advisory given  Plan Discussed with: CRNA  Anesthesia Plan Comments:         Anesthesia Quick Evaluation

## 2018-09-08 NOTE — Anesthesia Postprocedure Evaluation (Signed)
Anesthesia Post Note  Patient: [name]  Procedure(s) Performed: ESOPHAGOGASTRODUODENOSCOPY (EGD) WITH PROPOFOL (N/A ) BIOPSY  Patient location during evaluation: PACU Anesthesia Type: General Level of consciousness: awake and alert and patient cooperative Pain management: pain level controlled Vital Signs Assessment: post-procedure vital signs reviewed and stable Respiratory status: spontaneous breathing, nonlabored ventilation and respiratory function stable Cardiovascular status: blood pressure returned to baseline Postop Assessment: no apparent nausea or vomiting Anesthetic complications: no     Last Vitals:  Vitals:   09/08/18 1307  BP: 123/67  Pulse: (!) 55  Resp: 18  Temp: 36.7 C  SpO2: 98%    Last Pain:  Vitals:   09/08/18 1307  TempSrc: Oral  PainSc: 0-No pain                 Deejay Koppelman J

## 2018-09-08 NOTE — Interval H&P Note (Signed)
History and Physical Interval Note:  09/08/2018 2:22 PM  [name]  has presented today for surgery, with the diagnosis of noncardiac chest pain  The various methods of treatment have been discussed with the patient and family. After consideration of risks, benefits and other options for treatment, the patient has consented to  Procedure(s) with comments: ESOPHAGOGASTRODUODENOSCOPY (EGD) WITH PROPOFOL (N/A) - 2:45pm as a surgical intervention .  The patient's history has been reviewed, patient examined, no change in status, stable for surgery.  I have reviewed the patient's chart and labs.  Questions were answered to the patient's satisfaction.     Eaton CorporationSandi Slayde Brault

## 2018-09-08 NOTE — Transfer of Care (Signed)
Immediate Anesthesia Transfer of Care Note  Patient: [name] R Charlet  Procedure(s) Performed: ESOPHAGOGASTRODUODENOSCOPY (EGD) WITH PROPOFOL (N/A ) BIOPSY  Patient Location: PACU  Anesthesia Type:General  Level of Consciousness: awake and patient cooperative  Airway & Oxygen Therapy: Patient Spontanous Breathing  Post-op Assessment: Report given to RN and Post -op Vital signs reviewed and stable  Post vital signs: Reviewed and stable  Last Vitals:  Vitals Value Taken Time  BP 104/54 09/08/2018  2:52 PM  Temp    Pulse    Resp 12 09/08/2018  2:53 PM  SpO2    Vitals shown include unvalidated device data.  Last Pain:  Vitals:   09/08/18 1307  TempSrc: Oral  PainSc: 0-No pain      Patients Stated Pain Goal: 5 (09/08/18 1307)  Complications: No apparent anesthesia complications

## 2018-09-08 NOTE — Discharge Instructions (Signed)
You have gastritis & DUODENITIS DUE TO YOUR USING ASPIRIN. I biopsied your stomach.   DRINK WATER TO KEEP YOUR URINE LIGHT YELLOW.  FOLLOW A LOW FAT DIET. MEATS SHOULD BE BAKED, BROILED, OR BOILED. AVOID FRIED FOOD. SEE INFO BELOW.   CONTINUE PROTONIX. TAKE 30 MINUTES PRIOR TO MEALS TWICE DAILY UNTIL DEC 1 THEN ONCE DAILY FOREVER.   YOUR BIOPSY RESULTS WILL BE BACK IN 5 BUSINESS DAYS.  FOLLOW UP IN 4 MOS WITH DR. Berry Gallacher.  UPPER ENDOSCOPY AFTER CARE Read the instructions outlined below and refer to this sheet in the next week. These discharge instructions provide you with general information on caring for yourself after you leave the hospital. While your treatment has been planned according to the most current medical practices available, unavoidable complications occasionally occur. If you have any problems or questions after discharge, call DR. Snigdha Howser, [phone].  ACTIVITY  You may resume your regular activity, but move at a slower pace for the next 24 hours.   Take frequent rest periods for the next 24 hours.   Walking will help get rid of the air and reduce the bloated feeling in your belly (abdomen).   No driving for 24 hours (because of the medicine (anesthesia) used during the test).   You may shower.   Do not sign any important legal documents or operate any machinery for 24 hours (because of the anesthesia used during the test).    NUTRITION  Drink plenty of fluids.   You may resume your normal diet as instructed by your doctor.   Begin with a light meal and progress to your normal diet. Heavy or fried foods are harder to digest and may make you feel sick to your stomach (nauseated).   Avoid alcoholic beverages for 24 hours or as instructed.    MEDICATIONS  You may resume your normal medications.   WHAT YOU CAN EXPECT TODAY  Some feelings of bloating in the abdomen.   Passage of more gas than usual.    IF YOU HAD A BIOPSY TAKEN DURING THE UPPER  ENDOSCOPY:  Eat a soft diet IF YOU HAVE NAUSEA, BLOATING, ABDOMINAL PAIN, OR VOMITING.    FINDING OUT THE RESULTS OF YOUR TEST Not all test results are available during your visit. DR. Darrick PennaFIELDS WILL CALL YOU WITHIN 14 DAYS OF YOUR PROCEDUE WITH YOUR RESULTS. Do not assume everything is normal if you have not heard from DR. Anadelia Kintz, CALL HER OFFICE AT [phone].  SEEK IMMEDIATE MEDICAL ATTENTION AND CALL THE OFFICE: [phone] IF:  You have more than a spotting of blood in your stool.   Your belly is swollen (abdominal distention).   You are nauseated or vomiting.   You have a temperature over 101F.   You have abdominal pain or discomfort that is severe or gets worse throughout the day.   Gastritis/DUODENITIS  Gastritis is an inflammation (the body's way of reacting to injury and/or infection) of the stomach. DUODENITIS is an inflammation (the body's way of reacting to injury and/or infection) of the FIRST PART OF THE SMALL INTESTINES. It is often caused by bacterial (germ) infections. It can also be caused BY ASPIRIN, BC/GOODY POWDER'S, (IBUPROFEN) MOTRIN, OR ALEVE (NAPROXEN), chemicals (including alcohol), SPICY FOODS, and medications. This illness may be associated with generalized malaise (feeling tired, not well), UPPER ABDOMINAL STOMACH cramps, and fever. One common bacterial cause of gastritis is an organism known as H. Pylori. This can be treated with antibiotics.     REFLUX  TREATMENT There are medicines used to treat reflux including: Antacids.  PEPCID Proton-pump inhibitors: PROTONIX    Low-Fat Diet BREADS, CEREALS, PASTA, RICE, DRIED PEAS, AND BEANS These products are high in carbohydrates and most are low in fat. Therefore, they can be increased in the diet as substitutes for fatty foods. They too, however, contain calories and should not be eaten in excess. Cereals can be eaten for snacks as well as for breakfast.  Include foods that contain fiber (fruits,  vegetables, whole grains, and legumes). Research shows that fiber may lower blood cholesterol levels, especially the water-soluble fiber found in fruits, vegetables, oat products, and legumes. FRUITS AND VEGETABLES It is good to eat fruits and vegetables. Besides being sources of fiber, both are rich in vitamins and some minerals. They help you get the daily allowances of these nutrients. Fruits and vegetables can be used for snacks and desserts. MEATS Limit lean meat, chicken, [place], and fish to no more than 6 ounces per day. Beef, Pork, and Lamb Use lean cuts of beef, pork, and lamb. Lean cuts include:  Extra-lean ground beef.  Arm roast.  Sirloin tip.  Center-cut ham.  Round steak.  Loin chops.  Rump roast.  Tenderloin.  Trim all fat off the outside of meats before cooking. It is not necessary to severely decrease the intake of red meat, but lean choices should be made. Lean meat is rich in protein and contains a highly absorbable form of iron. Premenopausal women, in particular, should avoid reducing lean red meat because this could increase the risk for low red blood cells (iron-deficiency anemia).  Chicken and [place] These are good sources of protein. The fat of poultry can be reduced by removing the skin and underlying fat layers before cooking. Chicken and [place] can be substituted for lean red meat in the diet. Poultry should not be fried or covered with high-fat sauces. Fish and Shellfish Fish is a good source of protein. Shellfish contain cholesterol, but they usually are low in saturated fatty acids. The preparation of fish is important. Like chicken and [place], they should not be fried or covered with high-fat sauces. EGGS Egg whites contain no fat or cholesterol. They can be eaten often. Try 1 to 2 egg whites instead of whole eggs in recipes or use egg substitutes that do not contain yolk.  MILK AND DAIRY PRODUCTS Use skim or 1% milk instead of 2% or whole milk. Decrease whole  milk, natural, and processed cheeses. Use nonfat or low-fat (2%) cottage cheese or low-fat cheeses made from vegetable oils. Choose nonfat or low-fat (1 to 2%) yogurt. Experiment with evaporated skim milk in recipes that call for heavy cream. Substitute low-fat yogurt or low-fat cottage cheese for sour cream in dips and salad dressings. Have at least 2 servings of low-fat dairy products, such as 2 glasses of skim (or 1%) milk each day to help get your daily calcium intake.  FATS AND OILS Butterfat, lard, and beef fats are high in saturated fat and cholesterol. These should be avoided.Vegetable fats do not contain cholesterol. AVOID coconut oil, palm oil, and palm kernel oil, WHICH are very high in saturated fats. These should be limited. These fats are often used in bakery goods, processed foods, popcorn, oils, and nondairy creamers. Vegetable shortenings and some peanut butters contain hydrogenated oils, which are also saturated fats. Read the labels on these foods and check for saturated vegetable oils.  Desirable liquid vegetable oils are corn oil, cottonseed oil, olive oil, canola  oil, safflower oil, soybean oil, and sunflower oil. Peanut oil is not as good, but small amounts are acceptable. Buy a heart-healthy tub margarine that has no partially hydrogenated oils in the ingredients. AVOID Mayonnaise and salad dressings often are made from unsaturated fats.  OTHER EATING TIPS Snacks  Most sweets should be limited as snacks. They tend to be rich in calories and fats, and their caloric content outweighs their nutritional value. Some good choices in snacks are graham crackers, melba toast, soda crackers, bagels (no egg), English muffins, fruits, and vegetables. These snacks are preferable to snack crackers, [place] fries, and chips. Popcorn should be air-popped or cooked in small amounts of liquid vegetable oil.  Desserts Eat fruit, low-fat yogurt, and fruit ices instead of pastries, cake, and cookies.  Sherbet, angel food cake, gelatin dessert, frozen low-fat yogurt, or other frozen products that do not contain saturated fat (pure fruit juice bars, frozen ice pops) are also acceptable.   COOKING METHODS Choose those methods that use little or no fat. They include: Poaching.  Braising.  Steaming.  Grilling.  Baking.  Stir-frying.  Broiling.  Microwaving.  Foods can be cooked in a nonstick pan without added fat, or use a nonfat cooking spray in regular cookware. Limit fried foods and avoid frying in saturated fat. Add moisture to lean meats by using water, broth, cooking wines, and other nonfat or low-fat sauces along with the cooking methods mentioned above. Soups and stews should be chilled after cooking. The fat that forms on top after a few hours in the refrigerator should be skimmed off. When preparing meals, avoid using excess salt. Salt can contribute to raising blood pressure in some people.  EATING AWAY FROM HOME Order entres, potatoes, and vegetables without sauces or butter. When meat exceeds the size of a deck of cards (3 to 4 ounces), the rest can be taken home for another meal. Choose vegetable or fruit salads and ask for low-calorie salad dressings to be served on the side. Use dressings sparingly. Limit high-fat toppings, such as bacon, crumbled eggs, cheese, sunflower seeds, and olives. Ask for heart-healthy tub margarine instead of butter.

## 2018-09-08 NOTE — Op Note (Addendum)
[facility] Patient Name: [name] Procedure Date: 09/08/2018 2:17 PM MRN: [id number] Date of Birth: November 23, 1950 Attending MD: Jonette EvaSandi Cele Mote MD, MD CSN: [id number] Age: 67 Admit Type: Outpatient Procedure:                Upper GI endoscopy WITH COLD FORCEPS BIOPSY Indications:              Chest pain (non cardiac) Providers:                Jonette EvaSandi Jahlani Lorentz MD, MD, Jannett CelestineAnitra Bell, RN, Sterling Bigiffani                            Roberts, RN, Edythe ClarityKelly Cox, Technician Referring MD:             Corrie MckusickJohn C. Golding MD, MD Medicines:                Propofol per Anesthesia Complications:            No immediate complications. Estimated Blood Loss:     Estimated blood loss was minimal. Procedure:                Pre-Anesthesia Assessment:                           - Prior to the procedure, a History and Physical                            was performed, and patient medications and                            allergies were reviewed. The patient's tolerance of                            previous anesthesia was also reviewed. The risks                            and benefits of the procedure and the sedation                            options and risks were discussed with the patient.                            All questions were answered, and informed consent                            was obtained. Prior Anticoagulants: The patient has                            taken aspirin, last dose was 1 day prior to                            procedure. ASA Grade Assessment: II - A patient                            with mild systemic disease. After reviewing the  risks and benefits, the patient was deemed in                            satisfactory condition to undergo the procedure.                            After obtaining informed consent, the endoscope was                            passed under direct vision. Throughout the                            procedure, the patient's blood  pressure, pulse, and                            oxygen saturations were monitored continuously. The                            [id number]-[id number] ([id number]) was introduced through the                            mouth, and advanced to the second part of duodenum.                            The upper GI endoscopy was accomplished without                            difficulty. The patient tolerated the procedure                            well. Scope In: 2:38:44 PM Scope Out: 2:43:31 PM Total Procedure Duration: 0 hours 4 minutes 47 seconds  Findings:      The examined esophagus was normal.      Segmental mild inflammation characterized by congestion (edema) and       erythema was found in the gastric antrum. Biopsies(3: BODY, 3: ANTRUM)       were taken with a cold forceps for Helicobacter pylori testing.      Diffuse moderate inflammation characterized by congestion (edema) and       erythema was found in the entire duodenum. Impression:               - NON-CARDIAC CHEST PAIN MOST LIKELY DUE TO                            UNCONTROLLED GERD, LESS LIKLEY ESOPHAGEAL MOTILITY                            DISORDER                           - MILD Gastritis/MODERATE Duodenitis. Moderate Sedation:      Per Anesthesia Care Recommendation:           - Patient has a contact number available for  emergencies. The signs and symptoms of potential                            delayed complications were discussed with the                            patient. Return to normal activities tomorrow.                            Written discharge instructions were provided to the                            patient.                           - Low fat diet.                           - Continue present medications.                           - Await pathology results.                           - Return to my office in 4 months. Procedure Code(s):        --- Professional ---                            [phone], Esophagogastroduodenoscopy, flexible,                            transoral; with biopsy, single or multiple Diagnosis Code(s):        --- Professional ---                           K29.70, Gastritis, unspecified, without bleeding                           K29.80, Duodenitis without bleeding                           R07.89, Other chest pain CPT copyright 2018 [employer]. All rights reserved. The codes documented in this report are preliminary and upon coder review may  be revised to meet current compliance requirements. Jonette Eva, MD Jonette Eva MD, MD 09/08/2018 3:02:22 PM This report has been signed electronically. Number of Addenda: 0

## 2018-09-10 ENCOUNTER — Telehealth: Payer: Self-pay | Admitting: Gastroenterology

## 2018-09-10 NOTE — Telephone Encounter (Signed)
Please call pt. His stomach Bx shows mild gastritis. HE HAS gastritis & DUODENITIS DUE TO HIS USING ASPIRIN. HIS CHEST PAIN IS MOST LIKELY DUE TO UNCONTROLLED REFLUX.   DRINK WATER TO KEEP YOUR URINE LIGHT YELLOW.  FOLLOW A LOW FAT DIET. MEATS SHOULD BE BAKED, BROILED, OR BOILED. AVOID FRIED FOOD.    TO TREAT REFLUX, GASTRITIS, AND DUODENITIS, CONTINUE PROTONIX. TAKE 30 MINUTES PRIOR TO MEALS TWICE DAILY UNTIL DEC 1 THEN ONCE DAILY FOREVER.  FOLLOW UP IN 4 MOS WITH DR. FIELDS.

## 2018-09-11 NOTE — Telephone Encounter (Signed)
LMOM to call.

## 2018-09-14 ENCOUNTER — Encounter (HOSPITAL_COMMUNITY): Payer: Self-pay | Admitting: Gastroenterology

## 2018-09-14 NOTE — Telephone Encounter (Signed)
Pt is aware.  

## 2018-09-14 NOTE — Telephone Encounter (Signed)
PATIENT SCHEDULED  °

## 2018-10-05 DIAGNOSIS — G894 Chronic pain syndrome: Secondary | ICD-10-CM | POA: Diagnosis not present

## 2018-10-05 DIAGNOSIS — Z6820 Body mass index (BMI) 20.0-20.9, adult: Secondary | ICD-10-CM | POA: Diagnosis not present

## 2018-10-05 DIAGNOSIS — M1991 Primary osteoarthritis, unspecified site: Secondary | ICD-10-CM | POA: Diagnosis not present

## 2018-10-05 DIAGNOSIS — F419 Anxiety disorder, unspecified: Secondary | ICD-10-CM | POA: Diagnosis not present

## 2018-12-01 ENCOUNTER — Ambulatory Visit: Payer: Medicare Other | Admitting: Nurse Practitioner

## 2018-12-16 ENCOUNTER — Encounter

## 2018-12-16 ENCOUNTER — Ambulatory Visit: Payer: Medicare Other | Admitting: Gastroenterology

## 2019-01-21 DIAGNOSIS — F419 Anxiety disorder, unspecified: Secondary | ICD-10-CM | POA: Diagnosis not present

## 2019-01-21 DIAGNOSIS — Z6822 Body mass index (BMI) 22.0-22.9, adult: Secondary | ICD-10-CM | POA: Diagnosis not present

## 2019-01-21 DIAGNOSIS — Z1389 Encounter for screening for other disorder: Secondary | ICD-10-CM | POA: Diagnosis not present

## 2019-05-21 ENCOUNTER — Other Ambulatory Visit (HOSPITAL_COMMUNITY): Payer: Self-pay | Admitting: Family Medicine

## 2019-05-21 ENCOUNTER — Other Ambulatory Visit: Payer: Self-pay | Admitting: Family Medicine

## 2019-05-21 DIAGNOSIS — R109 Unspecified abdominal pain: Secondary | ICD-10-CM

## 2019-05-21 DIAGNOSIS — Z87891 Personal history of nicotine dependence: Secondary | ICD-10-CM

## 2019-05-21 DIAGNOSIS — R911 Solitary pulmonary nodule: Secondary | ICD-10-CM

## 2019-05-21 DIAGNOSIS — Z122 Encounter for screening for malignant neoplasm of respiratory organs: Secondary | ICD-10-CM

## 2019-05-25 ENCOUNTER — Ambulatory Visit (HOSPITAL_COMMUNITY)
Admission: RE | Admit: 2019-05-25 | Discharge: 2019-05-25 | Disposition: A | Discharge status: Home / Self Care | Payer: Medicare Other | Source: Ambulatory Visit | Attending: Family Medicine | Admitting: Family Medicine

## 2019-05-25 ENCOUNTER — Other Ambulatory Visit: Payer: Self-pay

## 2019-05-25 DIAGNOSIS — R109 Unspecified abdominal pain: Secondary | ICD-10-CM | POA: Diagnosis not present

## 2019-05-25 DIAGNOSIS — Z87891 Personal history of nicotine dependence: Secondary | ICD-10-CM

## 2019-05-25 IMAGING — US ULTRASOUND ABDOMEN COMPLETE
1 series · 13 of 25 positions shown · non-contrast
Comparison: CT angiogram [DATE]

CLINICAL DATA: Abdominal pain

EXAM:
ABDOMEN ULTRASOUND COMPLETE

[Series 1: ultrasound abdomen complete · 0.15mm/px · 13 of 104 slices shown]
[im 1/104]
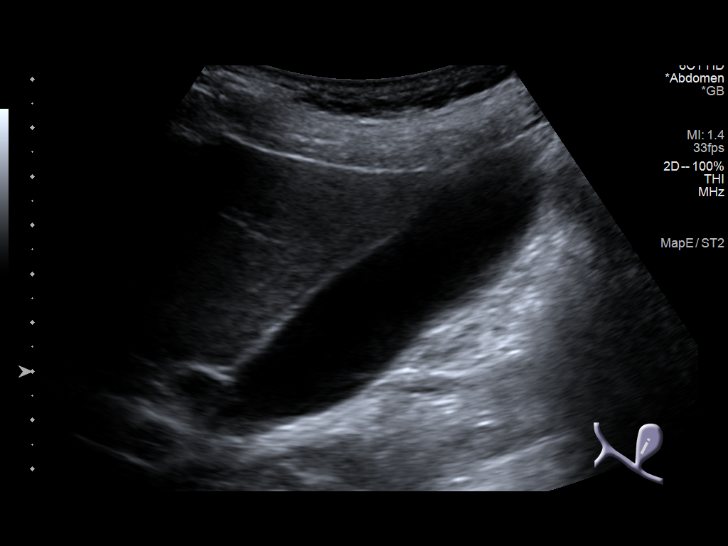
[im 9/104]
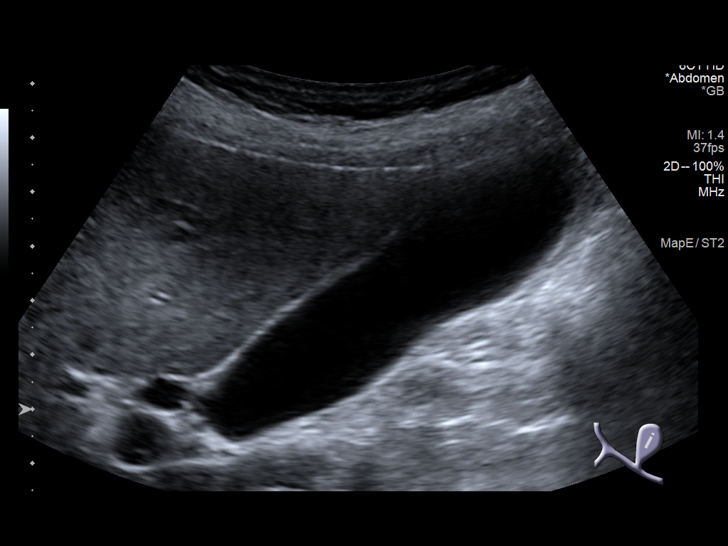
[im 18/104]
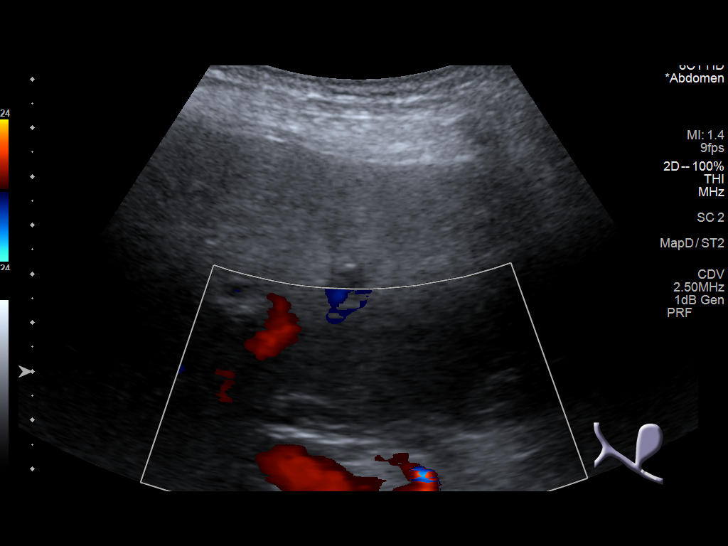
[im 26/104]
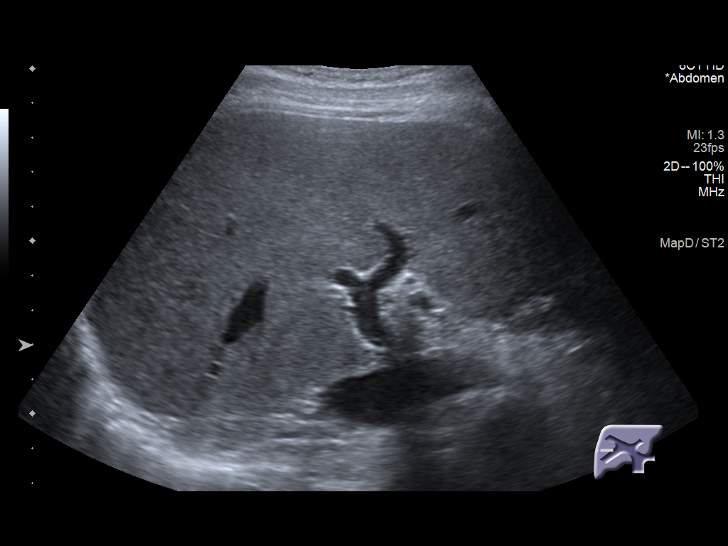
[im 35/104]
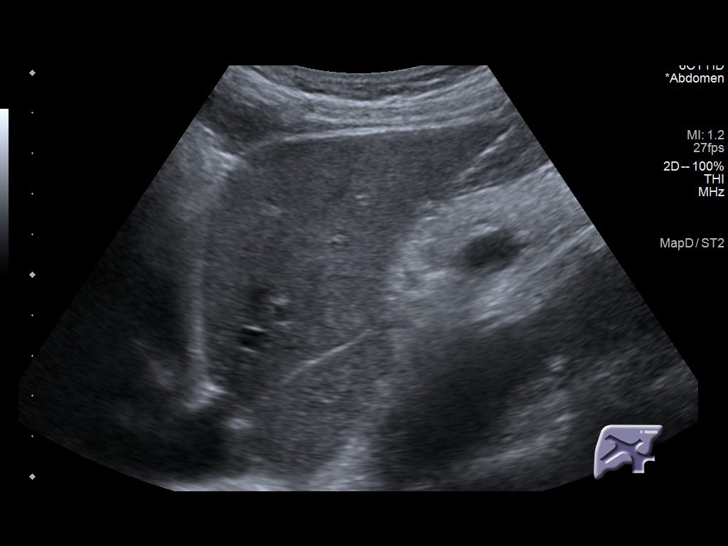
[im 43/104]
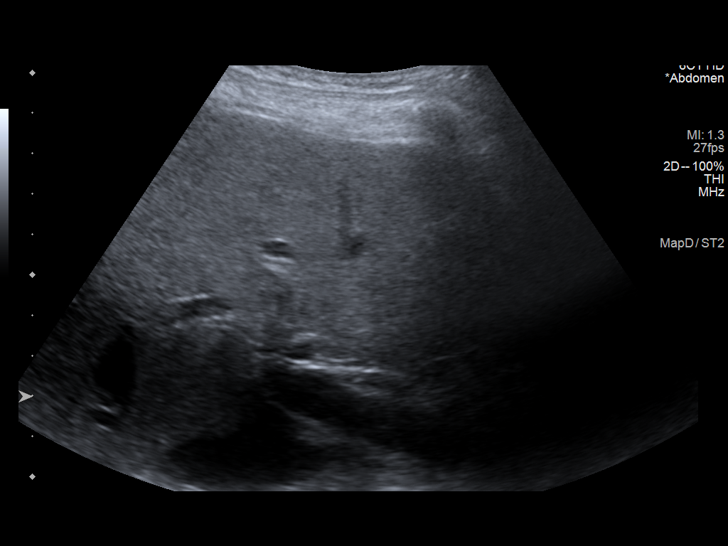
[im 52/104]
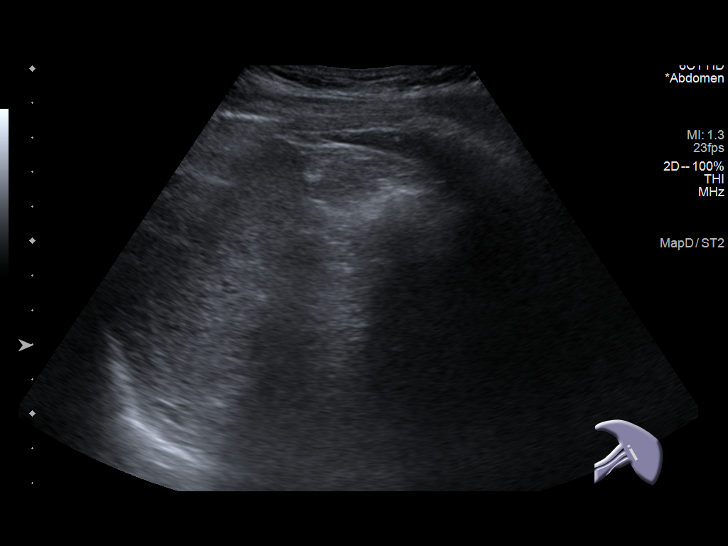
[im 61/104]
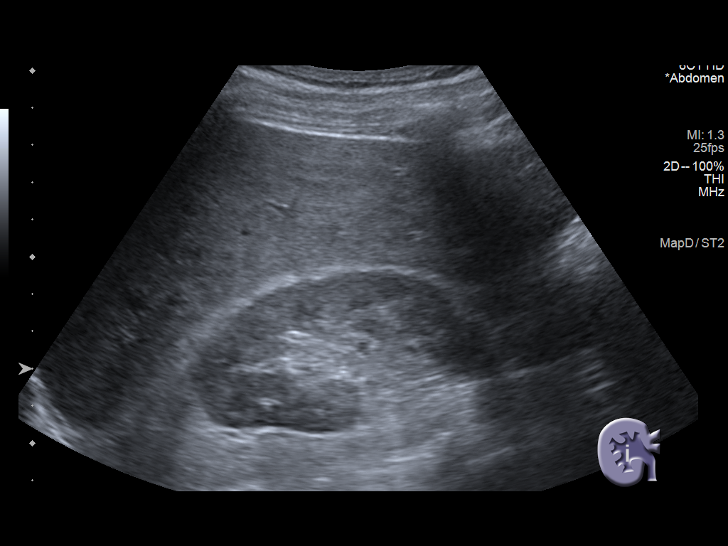
[im 69/104]
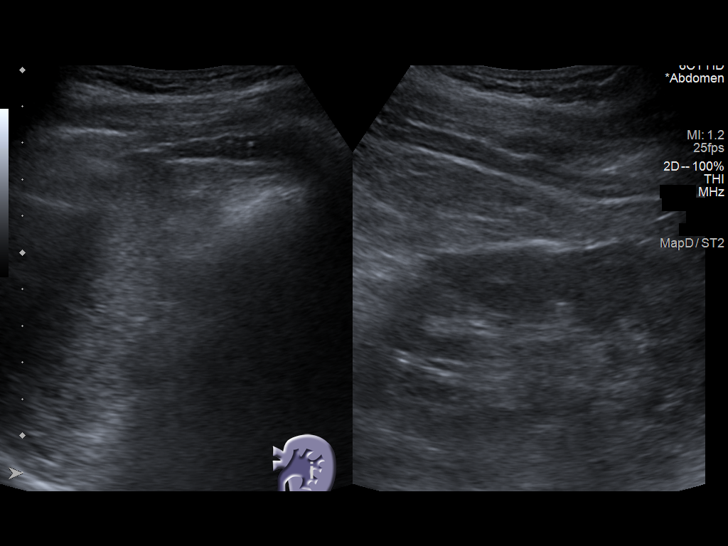
[im 78/104]
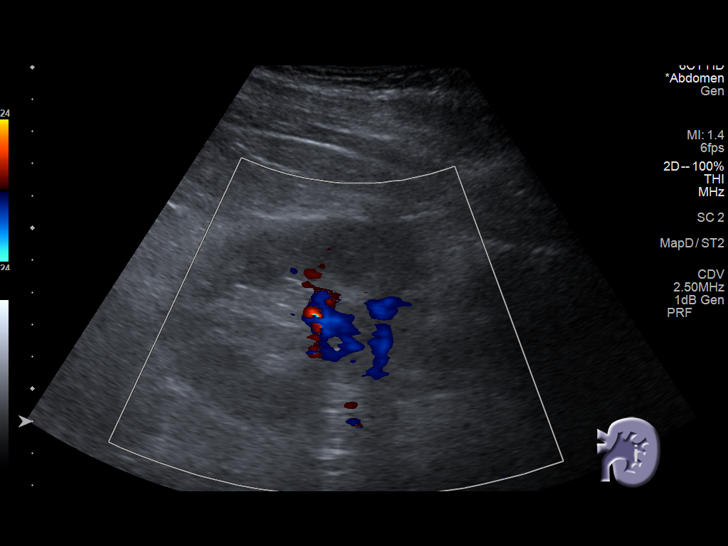
[im 86/104]
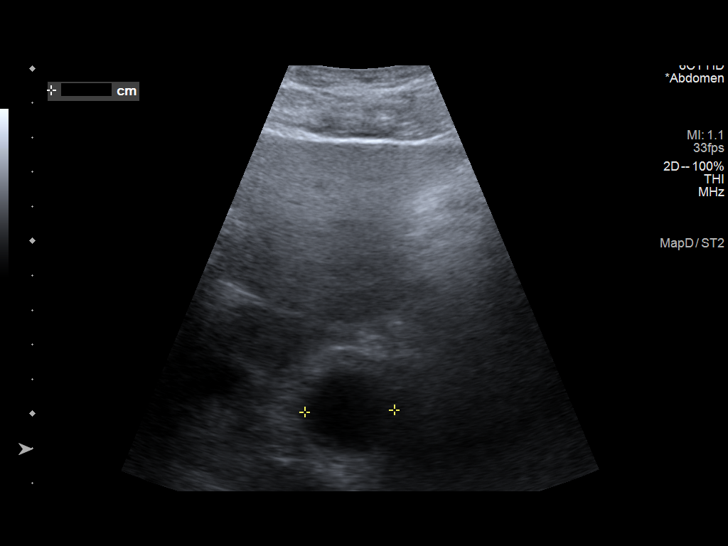
[im 95/104]
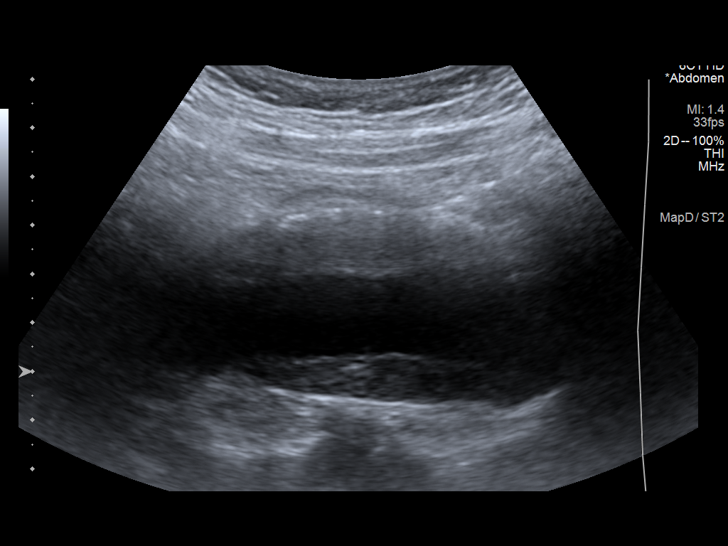
[im 104/104]
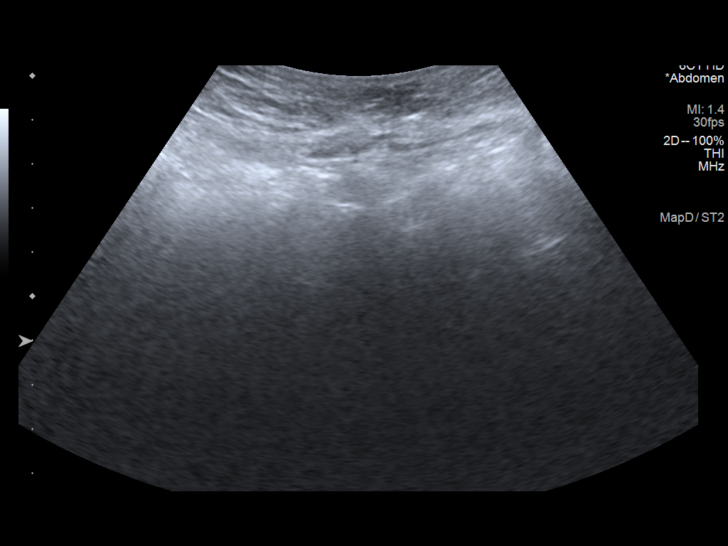

[13 of 25 positions shown; findings below may reference images not displayed]

FINDINGS: Gallbladder: No gallstones or wall thickening visualized. No
sonographic Murphy sign noted by sonographer.

Common bile duct: Diameter: 3.5 mm

Liver: No focal lesion identified. Within normal limits in
parenchymal echogenicity. Portal vein is patent on color Doppler
imaging with normal direction of blood flow towards the liver.

IVC: No abnormality visualized.

Pancreas: Visualized portion unremarkable.

Spleen: Size and appearance within normal limits.

Right Kidney: Length: 9.8 cm. Echogenicity within normal limits. No
mass or hydronephrosis visualized.

Left Kidney: Length: 10.3 cm. Echogenicity within normal limits. No
mass or hydronephrosis visualized.

Abdominal aorta: Proximal abdominal aorta measures 2.5 x 2.6 cm
trans axially. Mid abdominal aorta measures 1.9 x 1.7 cm trans
axially. Distal abdominal aorta measures 4.4 x 3.8 cm trans axially
with significant organized mural thrombus. Aorta remains patent.

Other findings: No ascites.
IMPRESSION: 1. Infrarenal abdominal aortic aneurysm measuring 4.4 x 3.8 cm trans
axially with significant organized mural thrombus, similar in
appearance to CT angiogram [DATE], when accounting for
differences in technique. Continued surveillance is recommended.
2. Remainder of the abdomen has an unremarkable sonographic
appearance.

## 2019-05-25 IMAGING — CT CT CHEST LUNG CANCER SCREENING LOW DOSE
3 of 5 series · 13 of 40 positions shown, 14 images · non-contrast
Comparison: [DATE] CT angiogram of the chest, abdomen and
pelvis.

CLINICAL DATA: 68-year-old asymptomatic male current smoker with 40
pack-year smoking history.

EXAM:
CT CHEST WITHOUT CONTRAST LOW-DOSE FOR LUNG CANCER SCREENING
TECHNIQUE: Multidetector CT imaging of the chest was performed following the
standard protocol without IV contrast.

[Series 2: axial st · axial · 0.66mm/px · z∈[+1385,+1485]mm · 2 of 62 slices shown, 3 images]
[im 21/62  mediastinal]
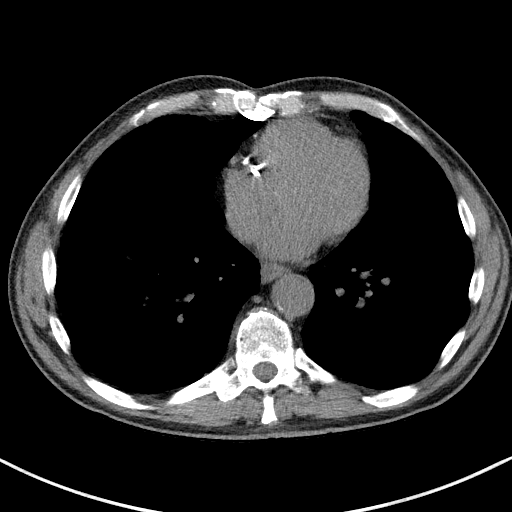
[im 21/62  lung]
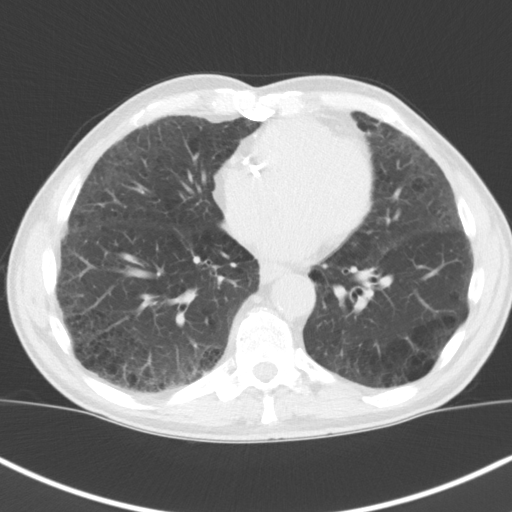
[im 41/62  lung]
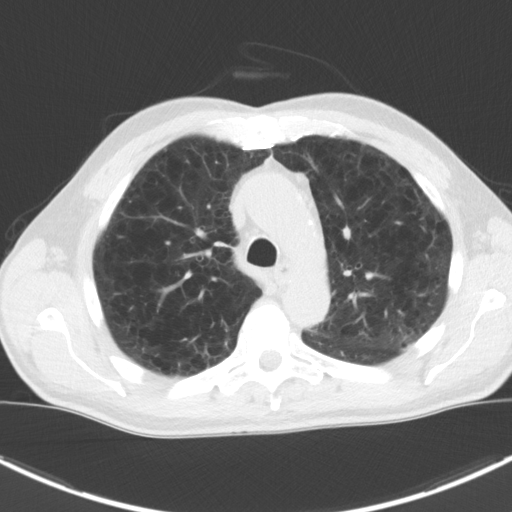

[Series 4: lungs · axial · 0.66mm/px · z∈[+1320,+1563]mm · 8 of 297 slices shown]
[im 27/297  lung]
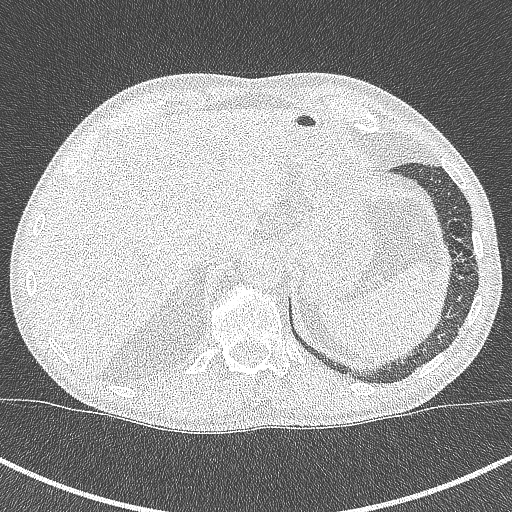
[im 54/297  lung]
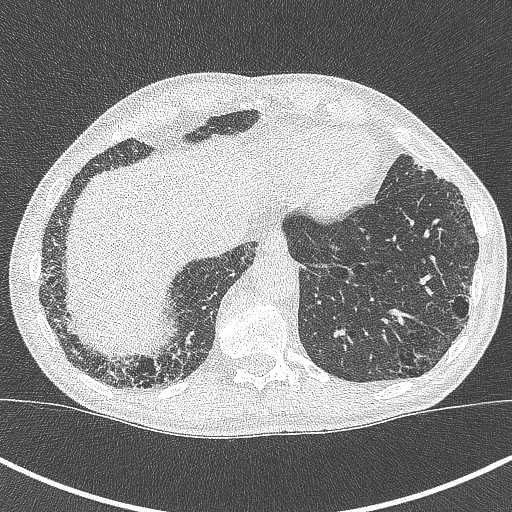
[im 95/297  lung]
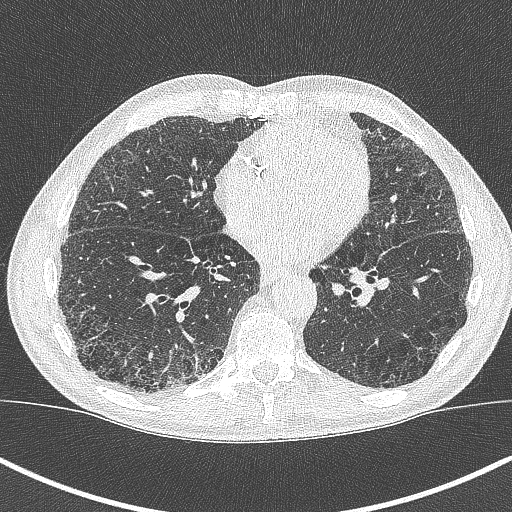
[im 135/297  lung]
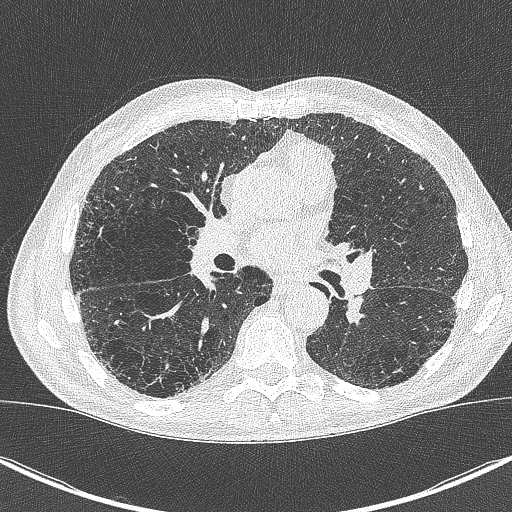
[im 162/297  lung]
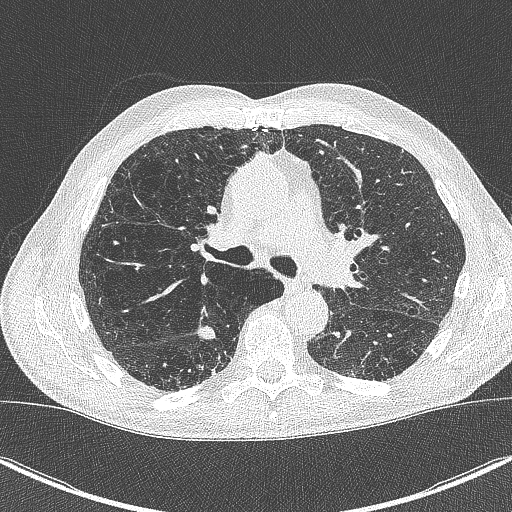
[im 202/297  lung]
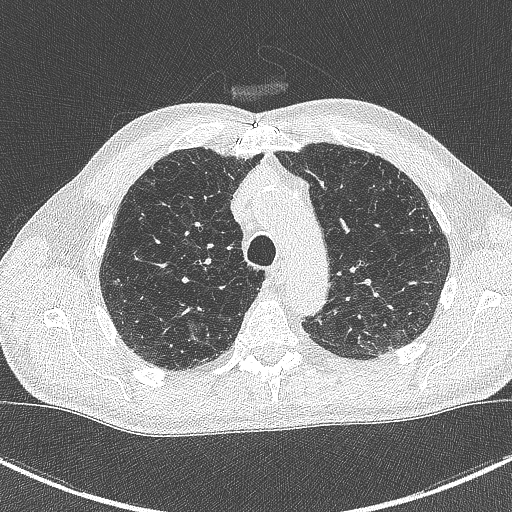
[im 243/297  lung]
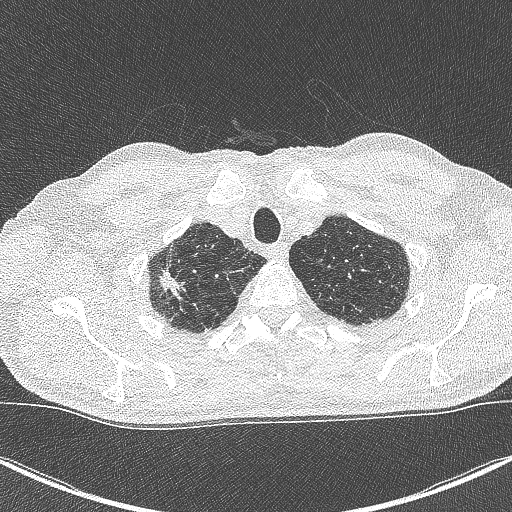
[im 270/297  lung]
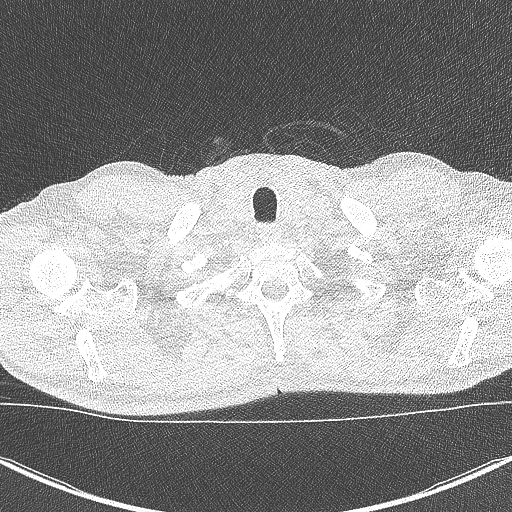

[Series 5: coronal · coronal · 0.64mm/px · 3 of 232 slices shown]
[im 47/232  lung]
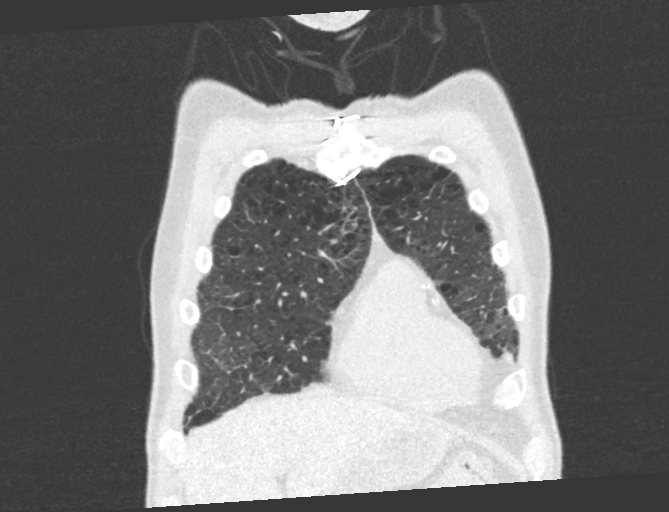
[im 93/232  lung]
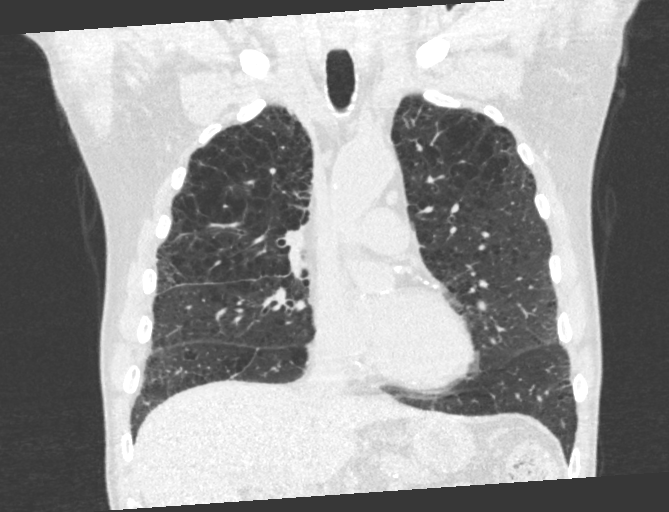
[im 139/232  lung]
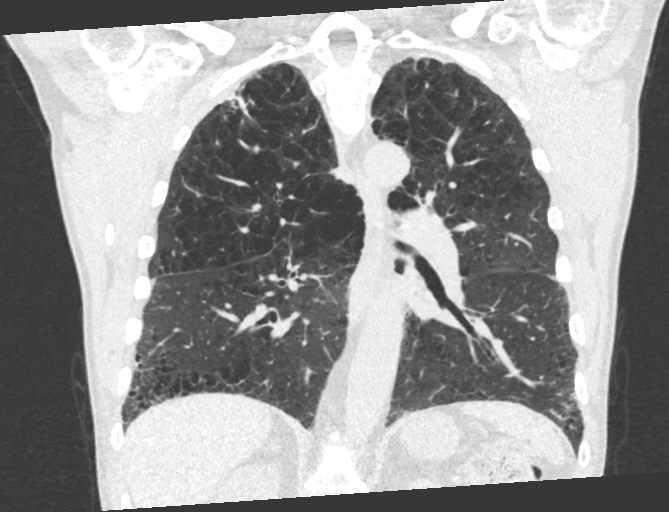

[13 of 40 positions shown; findings below may reference images not displayed]

FINDINGS: Cardiovascular: Normal heart size. No significant pericardial
effusion/thickening. Three-vessel coronary atherosclerosis status
post CABG. Atherosclerotic nonaneurysmal thoracic aorta. Normal
caliber pulmonary arteries.

Mediastinum/Nodes: No discrete thyroid nodules. Unremarkable
esophagus. No pathologically enlarged axillary, mediastinal or hilar
lymph nodes, noting limited sensitivity for the detection of hilar
adenopathy on this noncontrast study. Stable coarsely calcified
nonenlarged right hilar nodes from prior granulomatous disease.

Lungs/Pleura: No pneumothorax. No pleural effusion. Severe
centrilobular emphysema. No acute consolidative airspace disease or
lung masses. There are a few solid pulmonary nodules in both lungs,
largest an irregular apical right upper lobe nodule measuring
mm in volume derived mean diameter (series 4/image 54), which is new
since [DATE] chest CT.

Upper abdomen: No acute abnormality.

Musculoskeletal: No aggressive appearing focal osseous lesions.
Stable discontinuity in the lower most sternotomy wire. Mild
thoracic spondylosis.
IMPRESSION: Lung-RADS 4A, suspicious. Dominant 14.3 mm irregular apical right
upper lobe pulmonary nodule, new since [DATE] chest CT. Follow
up low-dose chest CT without contrast in 3 months (please use the
following order, "CT CHEST LCS NODULE FOLLOW-UP W/O CM") is
recommended. Alternatively, PET may be considered when there is a
solid component 8mm or larger.

Aortic Atherosclerosis ([OI]-[OI]) and Emphysema ([OI]-[OI]).

These results will be called to the ordering clinician or
representative by the Radiologist Assistant, and communication
documented in the PACS or zVision Dashboard.

## 2019-09-17 DIAGNOSIS — M542 Cervicalgia: Secondary | ICD-10-CM | POA: Diagnosis not present

## 2019-09-17 DIAGNOSIS — Z7189 Other specified counseling: Secondary | ICD-10-CM | POA: Diagnosis not present

## 2019-09-17 DIAGNOSIS — Z79891 Long term (current) use of opiate analgesic: Secondary | ICD-10-CM | POA: Diagnosis not present

## 2019-11-23 ENCOUNTER — Other Ambulatory Visit (HOSPITAL_COMMUNITY): Payer: Self-pay | Admitting: Family Medicine

## 2019-11-23 DIAGNOSIS — R9389 Abnormal findings on diagnostic imaging of other specified body structures: Secondary | ICD-10-CM

## 2019-11-30 ENCOUNTER — Other Ambulatory Visit: Payer: Self-pay

## 2019-11-30 ENCOUNTER — Ambulatory Visit (HOSPITAL_COMMUNITY)
Admission: RE | Admit: 2019-11-30 | Discharge: 2019-11-30 | Disposition: A | Discharge status: Home / Self Care | Payer: Medicare Other | Source: Ambulatory Visit | Attending: Family Medicine | Admitting: Family Medicine

## 2019-11-30 DIAGNOSIS — R9389 Abnormal findings on diagnostic imaging of other specified body structures: Secondary | ICD-10-CM | POA: Diagnosis present

## 2019-11-30 IMAGING — US US ABDOMEN COMPLETE
1 series · 13 of 25 positions shown · non-contrast
Comparison: [DATE].

CLINICAL DATA: Abdominal aortic aneurysm.

EXAM:
ABDOMEN ULTRASOUND COMPLETE

[Series 1: us abdomen complete · 0.17mm/px · 13 of 171 slices shown]
[im 1/171]
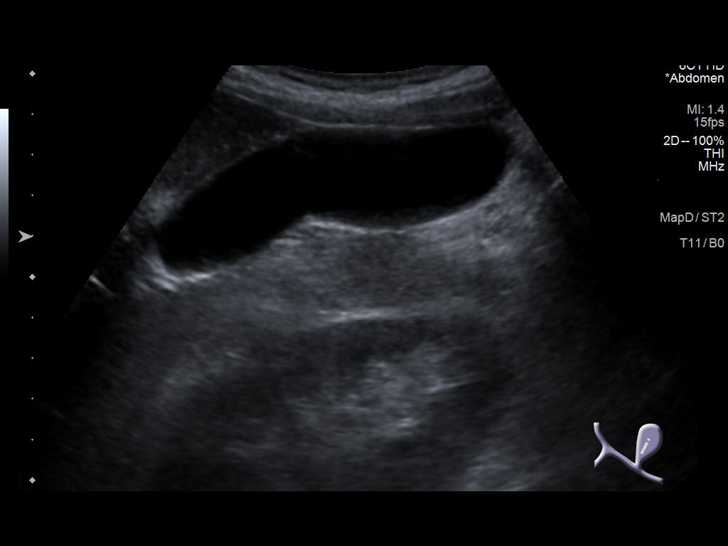
[im 15/171]
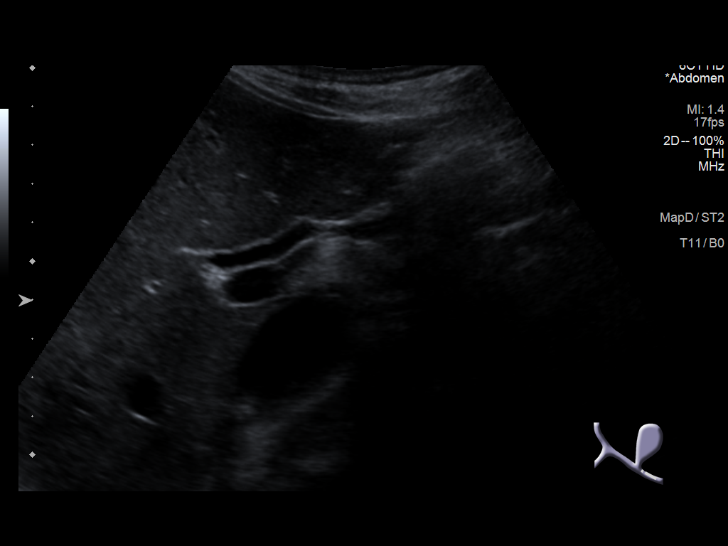
[im 29/171]
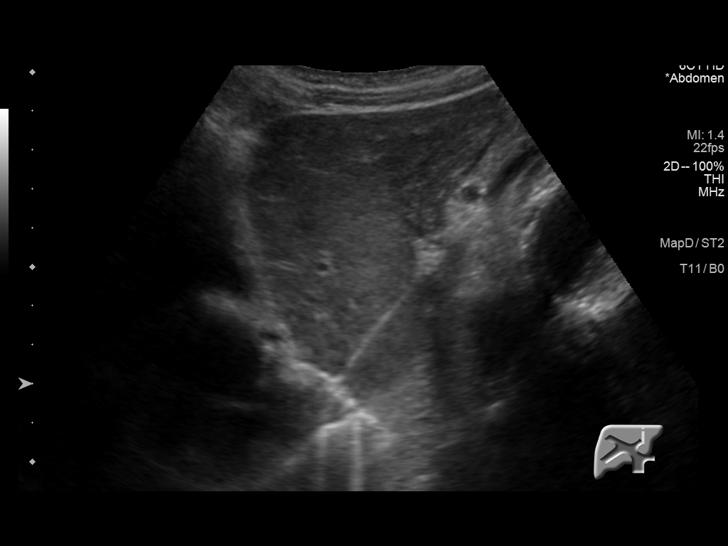
[im 43/171]
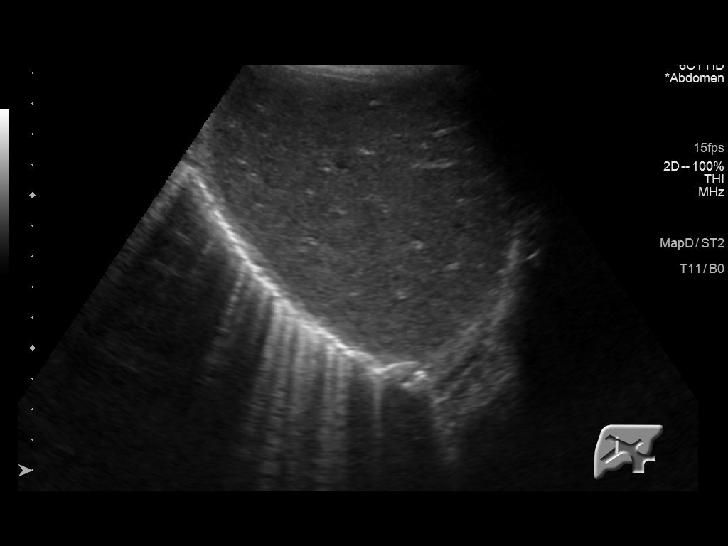
[im 57/171]
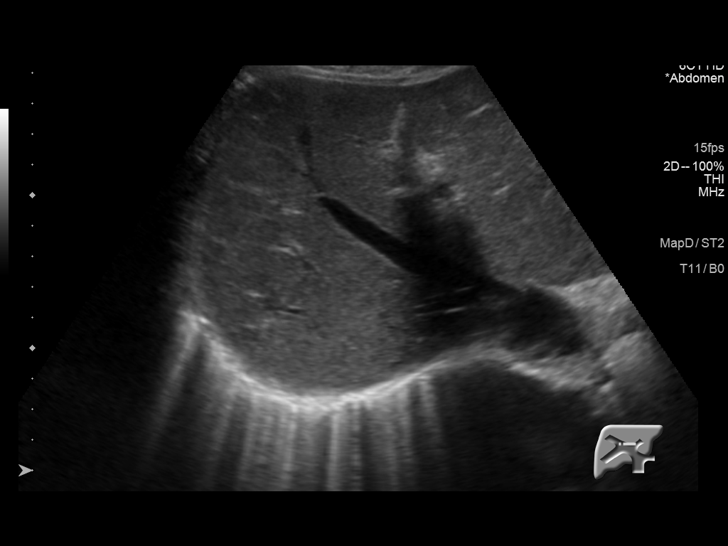
[im 71/171]
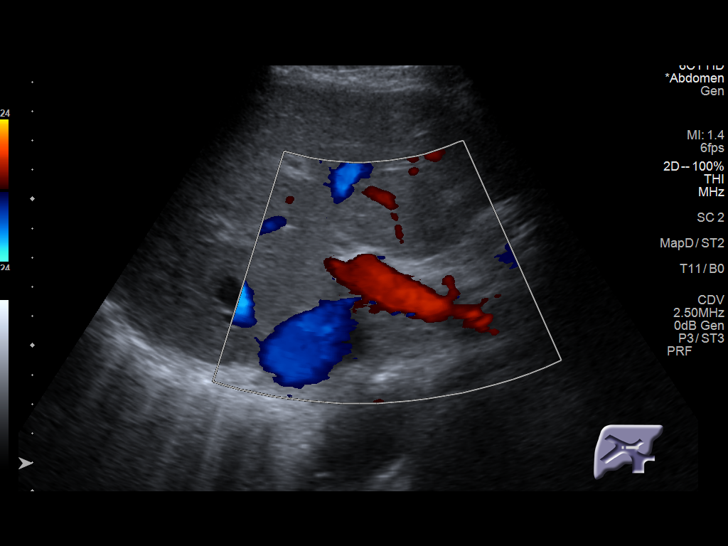
[im 86/171]
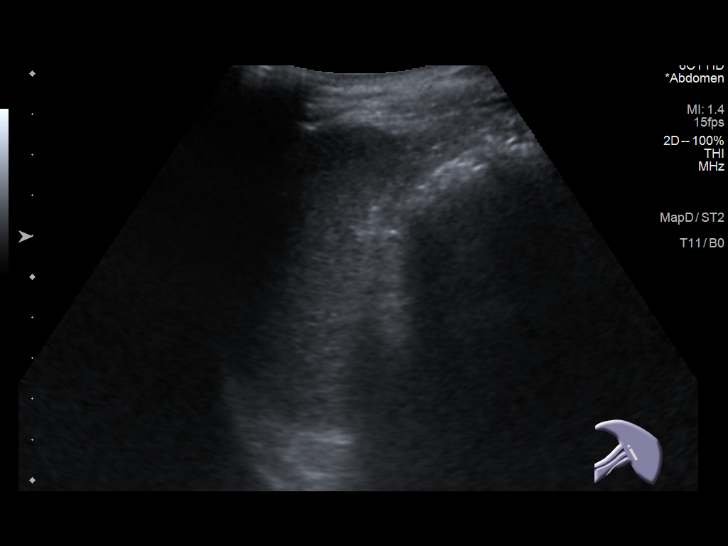
[im 100/171]
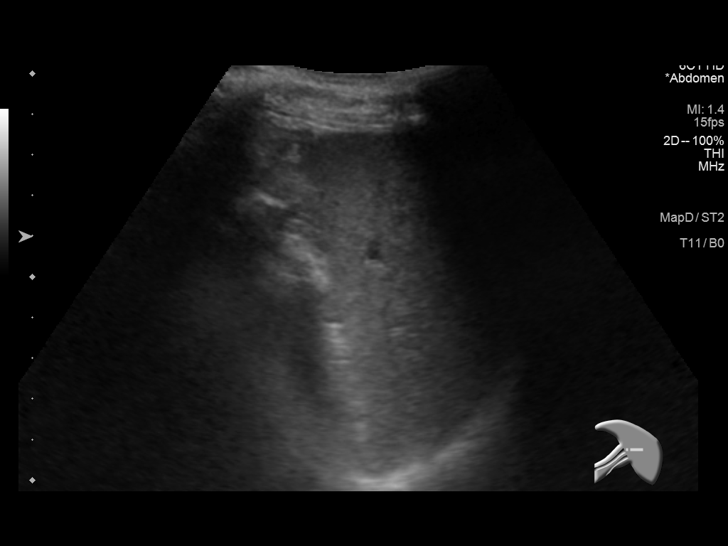
[im 114/171]
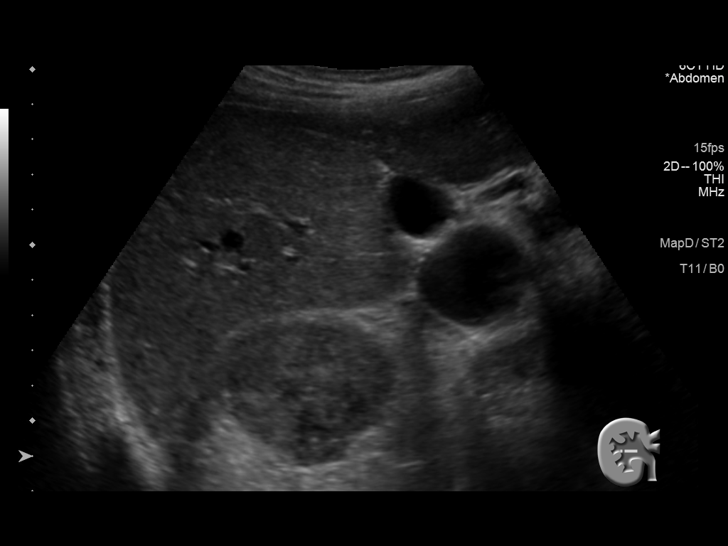
[im 128/171]
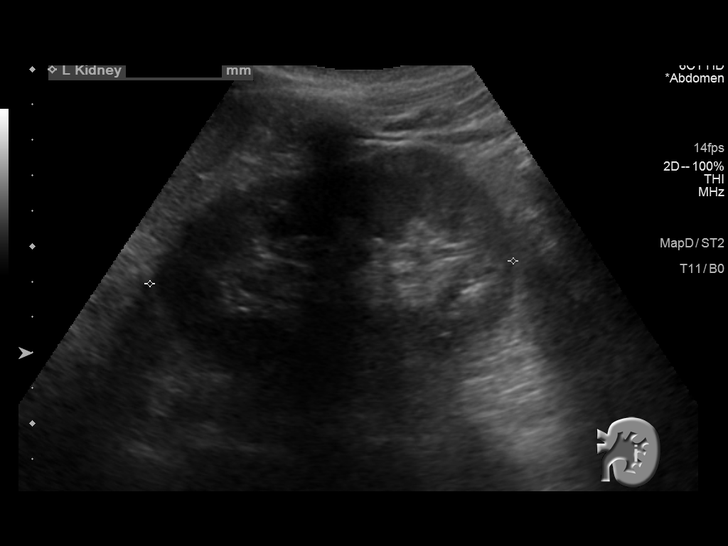
[im 142/171]
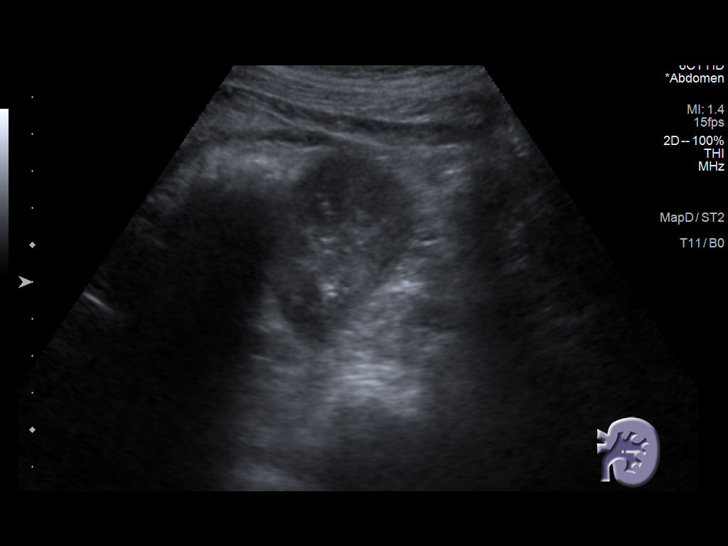
[im 156/171]
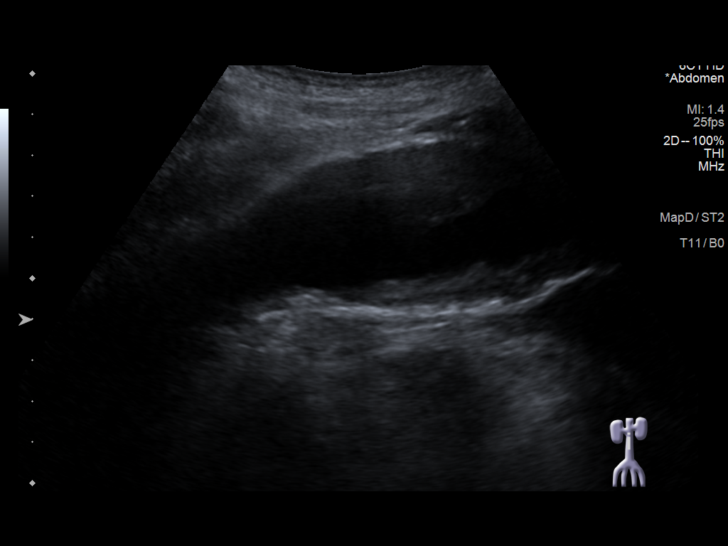
[im 171/171]
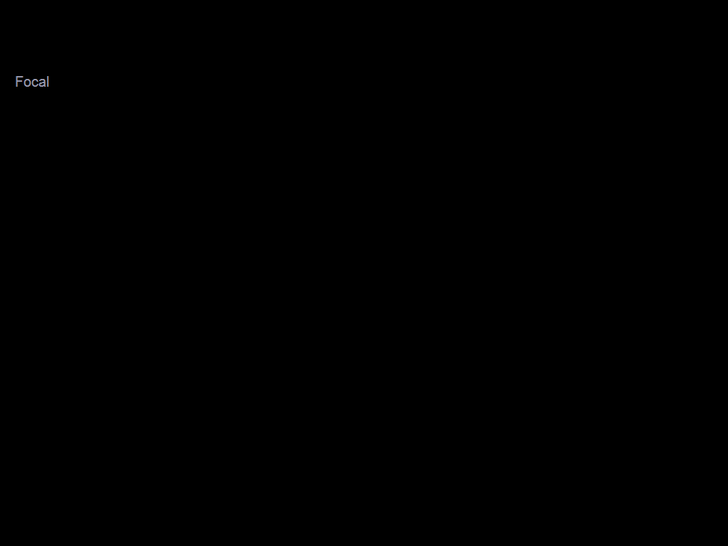

[13 of 25 positions shown; findings below may reference images not displayed]

FINDINGS: Gallbladder: No gallstones or wall thickening visualized. No
sonographic Murphy sign noted by sonographer.

Common bile duct: Diameter: 6 mm which is within normal limits.

Liver: No focal lesion identified. Within normal limits in
parenchymal echogenicity. Portal vein is patent on color Doppler
imaging with normal direction of blood flow towards the liver.

IVC: No abnormality visualized.

Pancreas: Visualized portion unremarkable.

Spleen: Size and appearance within normal limits.

Right Kidney: Length: 10 cm. Echogenicity within normal limits. No
mass or hydronephrosis visualized.

Left Kidney: Length: 0.2 cm. Echogenicity within normal limits. No
mass or hydronephrosis visualized.

Abdominal aorta: Infrarenal abdominal aortic aneurysm is noted with
maximum measured AP diameter of 4.5 cm and transverse diameter of
5.1 cm. This is significantly increased in size compared to prior
exam.

Other findings: None.
IMPRESSION: Infrarenal abdominal aortic aneurysm is significantly enlarged
compared to prior exam, currently measuring 5.1 x 4.5 cm, compared
to 4.5 x 3.8 cm on prior exam. Greater than 5 mm growth over the
past 12 months is associated with an increased risk of aneurysm
rupture. Recommend vascular surgery referral/consultation if not
already obtained. Aortic aneurysm NOS ([4D]-[4D]). These results
will be called to the ordering clinician or representative by the
Radiologist Assistant, and communication documented in the PACS or
zVision Dashboard.

No other abnormality seen in the abdomen.

## 2019-12-02 ENCOUNTER — Other Ambulatory Visit: Payer: Self-pay

## 2019-12-02 DIAGNOSIS — I713 Abdominal aortic aneurysm, ruptured, unspecified: Secondary | ICD-10-CM

## 2019-12-09 ENCOUNTER — Encounter: Payer: Medicare Other | Admitting: Vascular Surgery

## 2019-12-09 ENCOUNTER — Other Ambulatory Visit: Payer: Medicare Other

## 2019-12-17 ENCOUNTER — Ambulatory Visit
Admission: RE | Admit: 2019-12-17 | Discharge: 2019-12-17 | Disposition: A | Discharge status: Home / Self Care | Payer: Medicare Other | Source: Ambulatory Visit | Attending: Vascular Surgery | Admitting: Vascular Surgery

## 2019-12-17 ENCOUNTER — Other Ambulatory Visit: Payer: Self-pay

## 2019-12-17 DIAGNOSIS — I713 Abdominal aortic aneurysm, ruptured, unspecified: Secondary | ICD-10-CM

## 2019-12-17 IMAGING — CT CT CTA ABD/PEL W/CM AND/OR W/O CM
1 of 4 series · 10 of 32 positions shown, 13 images · IV contrast (APPLIED)
Comparison: CT of the chest, abdomen and pelvis-[DATE]

CLINICAL DATA: Preoperative planning for abdominal aortic aneurysm
repair.

EXAM:
CTA ABDOMEN AND PELVIS WITHOUT AND WITH CONTRAST
TECHNIQUE: Multidetector CT imaging of the abdomen and pelvis was performed
using the standard protocol during bolus administration of
intravenous contrast. Multiplanar reconstructed images and MIPs were
obtained and reviewed to evaluate the vascular anatomy.
CONTRAST:  75mL [JO] IOPAMIDOL ([JO]) INJECTION 76%

[Series 5: pre stent angio · axial · non-contrast · 0.69mm/px · z∈[-397,-60]mm · 10 of 406 slices shown, 13 images]
[im 46/406  soft-tissue]
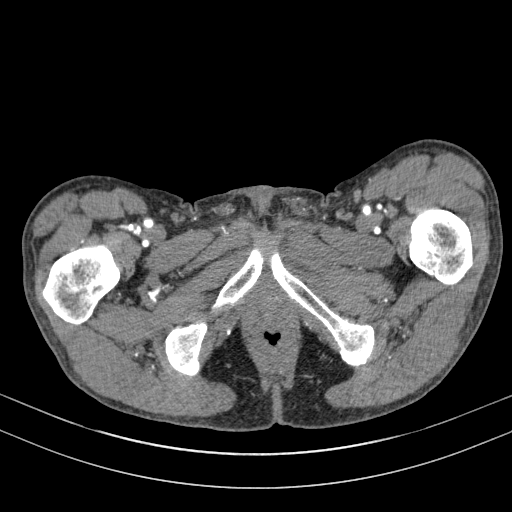
[im 46/406  bone]
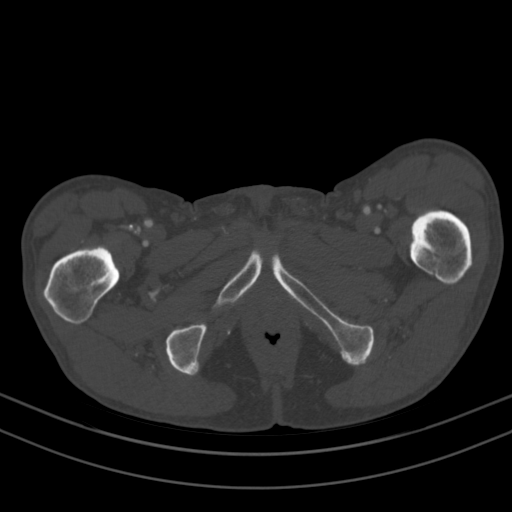
[im 91/406  soft-tissue]
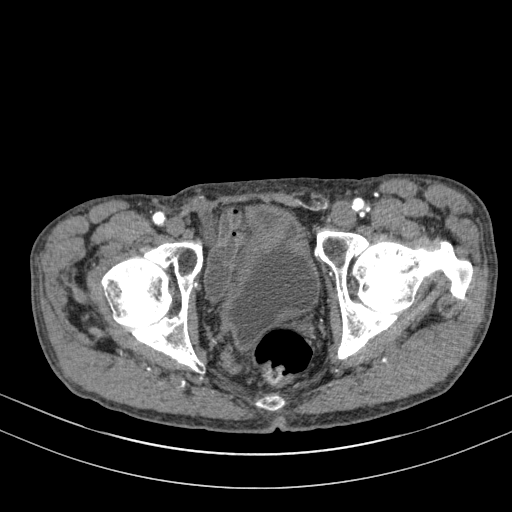
[im 136/406  soft-tissue]
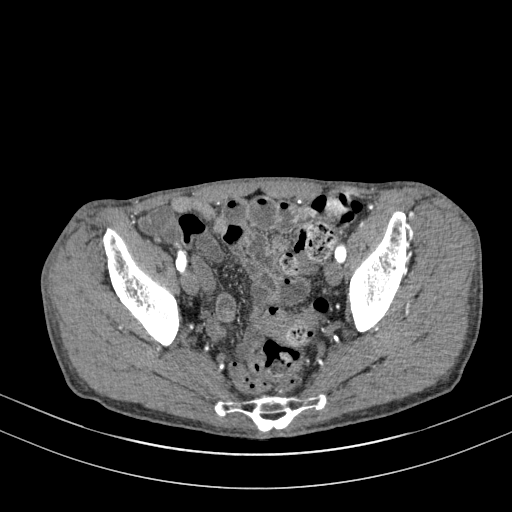
[im 181/406  soft-tissue]
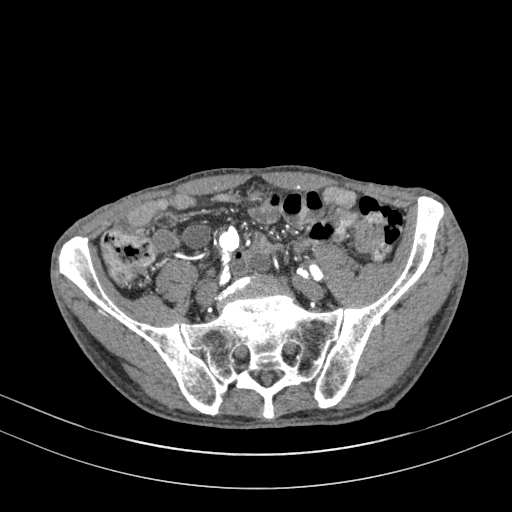
[im 226/406  soft-tissue]
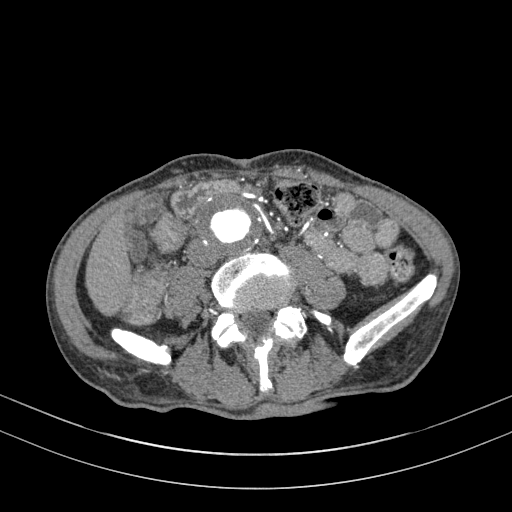
[im 271/406  soft-tissue]
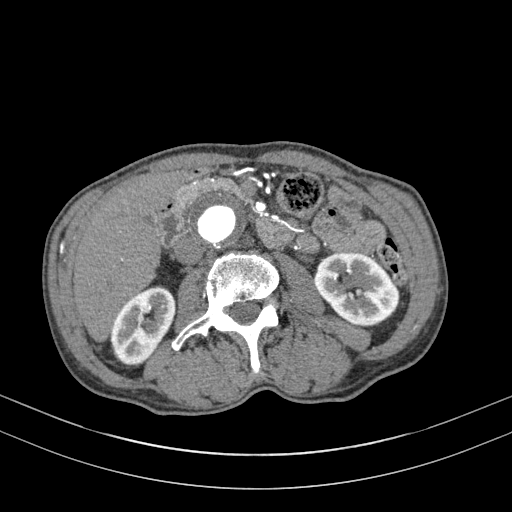
[im 316/406  soft-tissue]
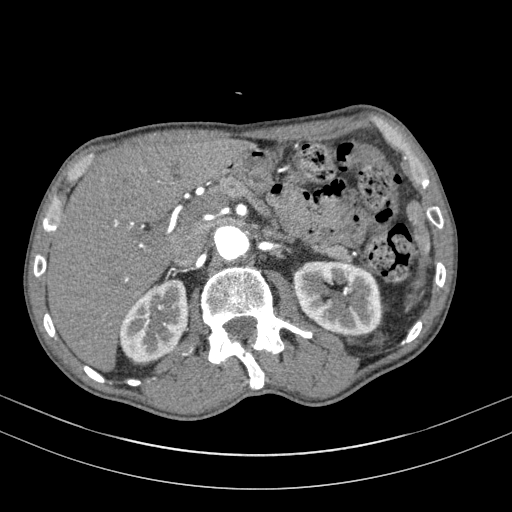
[im 316/406  lung]
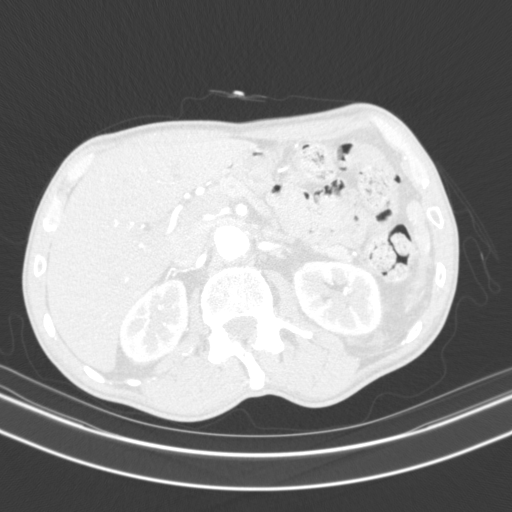
[im 338/406  lung]
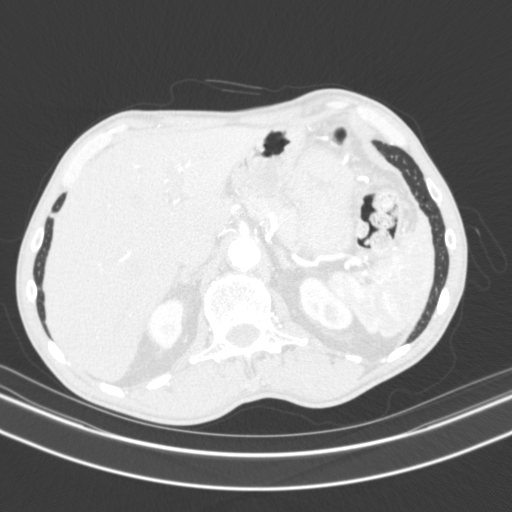
[im 361/406  soft-tissue]
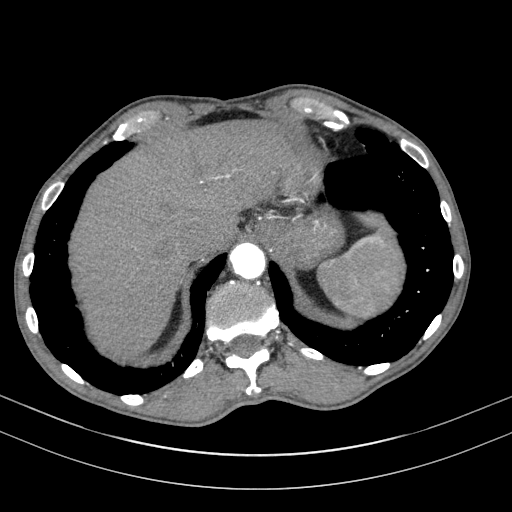
[im 361/406  lung]
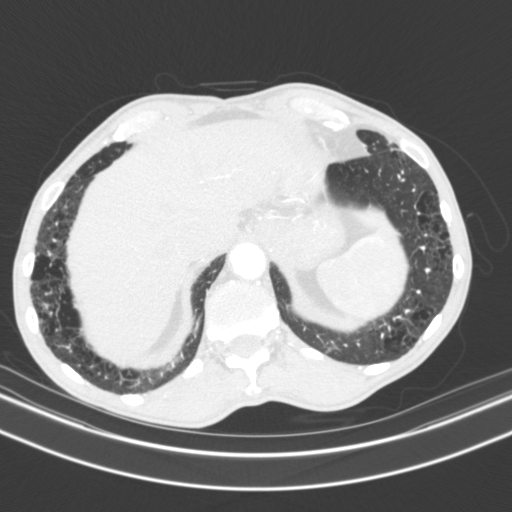
[im 383/406  lung]
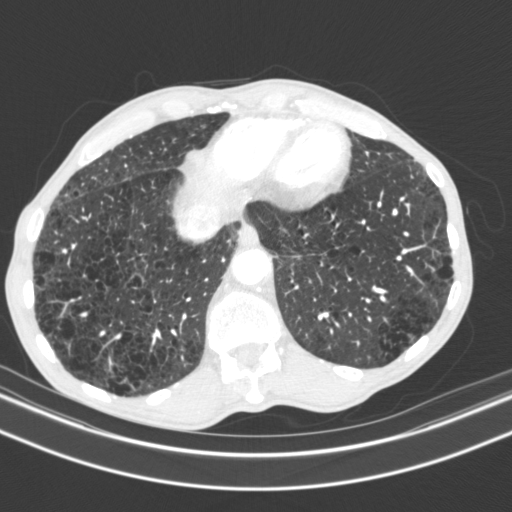

[10 of 32 positions shown; findings below may reference images not displayed]

FINDINGS: VASCULAR

Aorta: Interval increase in size infrarenal abdominal aortic
aneurysm currently measuring approximately 4.2 x 4.8 x 4.4 cm as
measured in greatest maximal oblique short axis axial (image 79,
series 4), coronal (image 41, series 9) and sagittal (image 73,
series 11), dimensions respectively, previously, 4.2 x 4.3 x 4.1 cm,
when compared to the [DATE] examination.

The aneurysm originates approximately 2.4 cm caudal to the take-off
of the slightly more inferior right renal artery and extends through
the aortic bifurcation to asymmetrically involve the right common
iliac artery. There is a large amount of slightly irregular
predominantly noncalcified mural thrombus within the dominant
component of the abdominal aortic aneurysm, not resulting in
hemodynamically significant stenosis. No abdominal aortic dissection
or periaortic stranding.

Celiac: There is a minimal amount of eccentric mixed calcified and
noncalcified atherosclerotic plaque involving the origin the celiac
artery, not resulting in a hemodynamically significant stenosis.
Conventional branching pattern.

SMA: There is a minimal amount of eccentric mixed calcified and
noncalcified atherosclerotic plaque involving the origin and main
trunk of the SMA, not resulting in hemodynamically significant
stenosis. A replaced right hepatic artery is incidentally noted to
arise from the proximal SMA. The distal tributaries the SMA appear
widely patent without discrete intraluminal filling defects to
suggest distal embolism.

Renals: Solitary bilaterally there is a moderate amount of eccentric
mixed calcified and noncalcified atherosclerotic plaque involving
the origin of the right renal artery approaching 50% luminal
narrowing (coronal image 47, series 9). The left renal artery is
widely patent without hemodynamically significant narrowing. No
vessel irregularity to suggest FMD.

IMA: Occluded at its origin with early reconstitution via collateral
supply from the SMA.

Inflow: As above, there is extension of the infrarenal abdominal
aortic aneurysm through the level of the aortic bifurcation to
asymmetrically involve the right common iliac artery which measures
approximately 2.2 cm in diameter (image 103, series 4 with the left
common iliac artery measuring approximately 1.6 cm (image 104,
series 4). The bilateral common iliac arteries taper to a normal
caliber at the level of their bifurcations.

Moderate to large amount mixed noncalcified though predominantly
calcified atherosclerotic plaque is seen throughout the bilateral
external iliac arteries which are tortuous and likely have tandem
areas of at least 50% luminal narrowing bilaterally, in particular,
there is a moderate to large amount of slightly irregular
predominantly noncalcified atherosclerotic plaque involving the
distal most aspect of the left external iliac artery which is
approaches 50% luminal narrowing (image 40, series 9 with associated
hypertrophy of the adjacent left inferior epigastric and pelvic
sidewall arterial collaterals. The bilateral internal iliac arteries
are heavily disease though patent and of normal caliber.

Proximal Outflow: There is a moderate amount of irregular mixed
calcified and noncalcified atherosclerotic plaque involving the
bilateral common and imaged portions of the bilateral deep and
superficial femoral arteries, not definitely resulting in
hemodynamically significant stenosis.

Veins: The IVC and pelvic venous systems appear widely patent.

Review of the MIP images confirms the above findings.

_________________________________________________________

NON-VASCULAR

Lower chest: Limited visualization of the lower thorax demonstrates
advanced mixed centrilobular and paraseptal emphysematous change
within the imaged lung bases. No focal airspace opacities. No
pleural effusion.

Normal heart size.  No pericardial effusion.

Hepatobiliary: Normal hepatic contour. No discrete hepatic lesions.
Normal appearance of the gallbladder given degree distention. No
radiopaque gallstones. No intra or extrahepatic biliary ductal
dilatation. No ascites.

Pancreas: Normal appearance of the pancreas.

Spleen: Normal appearance of the spleen.

Adrenals/Urinary Tract: There is symmetric enhancement and excretion
of the bilateral kidneys. No definite renal stones on this
postcontrast examination. No discrete renal lesions. No urine
obstruction or perinephric stranding.

There is mild thickening of the bilateral adrenal glands without
discrete nodule. Mild thickening the urinary bladder wall,
potentially accentuated due to underdistention.

Stomach/Bowel: Moderate colonic stool burden without evidence of
enteric obstruction. Normal appearance of the terminal ileum and
appendix. No discrete areas of bowel wall thickening. No
pneumoperitoneum, pneumatosis or portal venous gas.

Lymphatic: No bulky retroperitoneal, mesenteric, pelvic or inguinal
lymph

Reproductive: Normal appearance of the prostate gland. No free fluid
in the pelvic cul-de-sac.

Other: Regional soft tissues appear normal.

Musculoskeletal: No acute or aggressive osseous abnormalities. Grade
1 anterolisthesis of L5 upon S1 with associated bilateral L5 pars
defects. Mild multilevel lumbar spine DDD, worse at L2-L3 with disc
space height loss, endplate irregularity and sclerosis.
IMPRESSION: 1. Interval increase in size of large infrarenal abdominal aortic
aneurysm, currently measuring 4.8 cm, previously, 4.3 cm (when
compared to the [DATE] examination). Aortic aneurysm NOS
([JO]-[JO]).
2. The abdominal aortic aneurysm extends through the level of the
aortic bifurcation to asymmetrically involves the right common iliac
artery with the right common iliac artery measuring 2.2 cm and the
left common iliac artery measuring 1.9 cm.
3. Large amount of slightly irregular predominantly noncalcified
mural thrombus within the dominant component of the aneurysm, not
resulting in a hemodynamically significant stenosis. No abdominal
aortic dissection or periaortic stranding.
4. The IMA is occluded at its origin with early reconstitution via
collateral supply from the SMA.
5. Suspected 50% luminal narrowing involving the right renal artery
without associated delayed enhancement or asymmetric atrophy.
6. Marked tortuosity of the bilateral external iliac arteries which
contain a moderate to large amount of atherosclerotic plaque
resulting in tandem areas of at least 50% luminal narrowing, most
severely affecting the distal aspect of the left external iliac
artery.

## 2019-12-17 MED ORDER — IOPAMIDOL (ISOVUE-370) INJECTION 76%
75.0000 mL | Freq: Once | INTRAVENOUS | Status: AC | PRN
  Administered 2019-12-17: 75 mL via INTRAVENOUS

## 2019-12-23 ENCOUNTER — Ambulatory Visit (INDEPENDENT_AMBULATORY_CARE_PROVIDER_SITE_OTHER): Payer: Medicare Other | Admitting: Vascular Surgery

## 2019-12-23 ENCOUNTER — Encounter: Payer: Self-pay | Admitting: Vascular Surgery

## 2019-12-23 ENCOUNTER — Other Ambulatory Visit: Payer: Self-pay

## 2019-12-23 VITALS — BP 112/64 | HR 78 | Temp 98.2°F | Resp 20 | Ht 67.0 in | Wt 128.0 lb

## 2019-12-23 DIAGNOSIS — I714 Abdominal aortic aneurysm, without rupture, unspecified: Secondary | ICD-10-CM

## 2019-12-23 NOTE — Progress Notes (Signed)
Patient name: [name] MRN: [id number] DOB: 11/23/1950 Sex: male  REASON FOR CONSULT: Abdominal aortic aneurysm  HPI: [name] is a 69 y.o. 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  By ultrasound it was 3.2 cm in diameter 2016.  It was 3.8 cm in diameter by ultrasound in 2017.  It was again 3.8 cm in diameter March 2019.  Patient returns for follow-up today after recent CT angiogram due to increasing size of his aorta on ultrasound.  Ultrasound performed at [facility] on November 30, 2019 suggested 5.1 cm diameter aneurysm.  Other medical problems include arthritis, COPD, coronary artery disease which have been stable.  Past Medical History:  Diagnosis Date  . AAA (abdominal aortic aneurysm) ([place])   . Anxiety   . Arthritis   . COPD (chronic obstructive pulmonary disease) ([place])   . Coronary artery disease involving native coronary artery with angina pectoris ([place]) 01/2015   100% LAD, Severe dom RCA dz, small non-dom Cx --> CABG x 2 LIMA-LAD, SVG-RCA  . MI (myocardial infarction) ([place])   . Pulmonary nodule    benign   Past Surgical History:  Procedure Laterality Date  . BIOPSY  09/08/2018   Procedure: BIOPSY;  Surgeon: Danie Binder, MD;  Location: [facility];  Service: Endoscopy;;  gastric  . CARDIAC CATHETERIZATION  01/2015   Coronary angiography on 02/17/15 demonstrated the left main coronary artery to have mild 30-40% stenosis, 100% LAD stenosis with right to left collaterals and faint left to left collaterals, small nondominant left circumflex, small to medium caliber first obtuse marginal branch with 20% ostial and proximal stenosis. The RCA was noted to have diffuse disease.  Marland Kitchen CARDIAC CATHETERIZATION N/A 11/11/2016   Procedure: Left Heart Cath and Cors/Grafts Angiography;  Surgeon: Leonie Man, MD;  Location: [facility];  Service:  Cardiovascular;  Laterality: N/A;  . CORONARY ARTERY BYPASS GRAFT  01/2015  . ESOPHAGOGASTRODUODENOSCOPY (EGD) WITH PROPOFOL N/A 09/08/2018   Procedure: ESOPHAGOGASTRODUODENOSCOPY (EGD) WITH PROPOFOL;  Surgeon: Danie Binder, MD;  Location: [facility];  Service: Endoscopy;  Laterality: N/A;  2:45pm  . INGUINAL HERNIA REPAIR Bilateral   . VALVE REPLACEMENT  01/2015   Tricuspid Valve Replacement    Family History  Problem Relation Age of Onset  . Heart disease Father   . Heart attack Sister        Died in her 50s  . CAD Brother   . Heart attack Brother   . CAD Brother   . Heart attack Brother     SOCIAL HISTORY: Social History   Socioeconomic History  . Marital status: Divorced    Spouse name: Not on file  . Number of children: Not on file  . Years of education: Not on file  . Highest education level: Not on file  Occupational History  . Not on file  Tobacco Use  . Smoking status: Current Every Day Smoker    Packs/day: 2.00    Years: 60.00    Pack years: 120.00    Types: Cigarettes    Last attempt to quit: 02/11/2015    Years since quitting: 4.8  . Smokeless tobacco: Never Used  Substance and Sexual Activity  . Alcohol use: Not Currently    Alcohol/week: 1.0 standard drinks    Types: 1 Cans of beer per week  Comment: one beer a week  . Drug use: No  . Sexual activity: Not on file  Other Topics Concern  . Not on file  Social History Narrative  . Not on file   Social Determinants of Health   Financial Resource Strain:   . Difficulty of Paying Living Expenses: Not on file  Food Insecurity:   . Worried About Programme researcher, broadcasting/film/video in the Last Year: Not on file  . Ran Out of Food in the Last Year: Not on file  Transportation Needs:   . Lack of Transportation (Medical): Not on file  . Lack of Transportation (Non-Medical): Not on file  Physical Activity:   . Days of Exercise per Week: Not on file  . Minutes of Exercise per Session: Not on file  Stress:   .  Feeling of Stress : Not on file  Social Connections:   . Frequency of Communication with Friends and Family: Not on file  . Frequency of Social Gatherings with Friends and Family: Not on file  . Attends Religious Services: Not on file  . Active Member of Clubs or Organizations: Not on file  . Attends Banker Meetings: Not on file  . Marital Status: Not on file  Intimate Partner Violence:   . Fear of Current or Ex-Partner: Not on file  . Emotionally Abused: Not on file  . Physically Abused: Not on file  . Sexually Abused: Not on file    No Known Allergies  Current Outpatient Medications  Medication Sig Dispense Refill  . ALPRAZolam (XANAX) 1 MG tablet Take 1 mg by mouth 4 (four) times daily as needed for anxiety.   2  . aspirin EC 81 MG tablet Take 81 mg by mouth daily.     Marland Kitchen atorvastatin (LIPITOR) 20 MG tablet Take 1 tablet (20 mg total) by mouth daily at 6 PM. 30 tablet 0  . HYDROcodone-acetaminophen (NORCO) 7.5-325 MG tablet Take 1 tablet by mouth every 6 (six) hours as needed for moderate pain.   0  . pantoprazole (PROTONIX) 40 MG tablet Take 1 tablet (40 mg total) by mouth 2 (two) times daily before a meal. 60 tablet 0  . zolpidem (AMBIEN) 10 MG tablet Take 10 mg by mouth at bedtime as needed for sleep.   1  . nitroGLYCERIN (NITROSTAT) 0.4 MG SL tablet PLACE 1 TABLET UNDER THE TONGUE EVERY 5 MINUTES AS NEEDED FOR CHEST PAIN (Patient not taking: No sig reported) 100 tablet 0   No current facility-administered medications for this visit.    ROS:   General:  No weight loss, Fever, chills  HEENT: No recent headaches, no nasal bleeding, no visual changes, no sore throat  Neurologic: No dizziness, blackouts, seizures. No recent symptoms of stroke or mini- stroke. No recent episodes of slurred speech, or temporary blindness.  Cardiac: No recent episodes of chest pain/pressure, no shortness of breath at rest.  No shortness of breath with exertion.  Denies history of  atrial fibrillation or irregular heartbeat  Vascular: No history of rest pain in feet.  Mild cramping both legs probably history of claudication.  No history of non-healing ulcer, No history of DVT   Pulmonary: No home oxygen, no productive cough, no hemoptysis,  No asthma or wheezing  Musculoskeletal:  [ ]  Arthritis, [ ]  Low back pain,  [ ]  Joint pain  Hematologic:No history of hypercoagulable state.  No history of easy bleeding.  No history of anemia  Gastrointestinal: No hematochezia or melena,  No gastroesophageal reflux, no trouble swallowing  Urinary: [ ]  chronic Kidney disease, [ ]  on HD - [ ]  MWF or [ ]  TTHS, [ ]  Burning with urination, [ ]  Frequent urination, [ ]  Difficulty urinating;   Skin: No rashes  Psychological: No history of anxiety,  No history of depression   Physical Examination  Vitals:   12/23/19 1043  BP: 112/64  Pulse: 78  Resp: 20  Temp: 98.2 F (36.8 C)  SpO2: 97%  Weight: 128 lb (58.1 kg)  Height: 5\' 7"  (1.702 m)    Body mass index is 20.05 kg/m.  General:  Alert and oriented, no acute distress HEENT: Normal Neck: No JVD Pulmonary: Clear to auscultation bilaterally Cardiac: Regular Rate and Rhythm without murmur Abdomen: Soft, non-tender, non-distended, easily palpable pulsatile mass consistent with 4.8 cm aneurysm Skin: No rash Extremity Pulses:  2+ radial, brachial, femoral, popliteal absent dorsalis pedis, posterior tibial pulses bilaterally Musculoskeletal: No deformity or edema  Neurologic: Upper and lower extremity motor 5/5 and symmetric  DATA:  I reviewed the patient's images from his CT angiogram dated December 17, 2019.  Infrarenal abdominal aortic aneurysm currently measuring 4.8 cm diameter right common iliac artery 2.2 cm left common iliac artery 1.9 cm there is occlusion of the IMA.  The 50% narrowing of the right renal artery.   ASSESSMENT: 4.8 cm abdominal aortic aneurysm CT scan, currently asymptomatic.  This represents 1  cm of growth in the past year.  Although we are comparing different imaging modalities ultrasound versus CT scan.  Overall risk of 4.8 cm abdominal aortic aneurysm is fairly low.   PLAN: Ultrasound abdominal aorta in 6 months time.  Will be seen in our APP clinic at that visit  Consideration for repair if aneurysm diameter continues to grow at a rapid rate or becomes greater than 5.5 cm in diameter.   , MD [facility] Office: [phone] Pager: [phone]

## 2019-12-24 ENCOUNTER — Other Ambulatory Visit: Payer: Self-pay | Admitting: *Deleted

## 2019-12-24 DIAGNOSIS — I714 Abdominal aortic aneurysm, without rupture, unspecified: Secondary | ICD-10-CM

## 2020-01-12 ENCOUNTER — Other Ambulatory Visit: Payer: Self-pay

## 2020-01-12 ENCOUNTER — Encounter (HOSPITAL_COMMUNITY): Payer: Self-pay | Admitting: Emergency Medicine

## 2020-01-12 ENCOUNTER — Emergency Department (HOSPITAL_COMMUNITY)
Admission: EM | Admit: 2020-01-12 | Discharge: 2020-01-12 | Disposition: A | Discharge status: Home / Self Care | Payer: Medicare Other | Attending: Emergency Medicine | Admitting: Emergency Medicine

## 2020-01-12 ENCOUNTER — Emergency Department (HOSPITAL_COMMUNITY): Payer: Medicare Other

## 2020-01-12 DIAGNOSIS — I2510 Atherosclerotic heart disease of native coronary artery without angina pectoris: Secondary | ICD-10-CM | POA: Insufficient documentation

## 2020-01-12 DIAGNOSIS — R6884 Jaw pain: Secondary | ICD-10-CM | POA: Insufficient documentation

## 2020-01-12 DIAGNOSIS — Z79899 Other long term (current) drug therapy: Secondary | ICD-10-CM | POA: Diagnosis not present

## 2020-01-12 DIAGNOSIS — F17210 Nicotine dependence, cigarettes, uncomplicated: Secondary | ICD-10-CM | POA: Insufficient documentation

## 2020-01-12 DIAGNOSIS — J449 Chronic obstructive pulmonary disease, unspecified: Secondary | ICD-10-CM | POA: Diagnosis not present

## 2020-01-12 DIAGNOSIS — Z7982 Long term (current) use of aspirin: Secondary | ICD-10-CM | POA: Diagnosis not present

## 2020-01-12 DIAGNOSIS — G8929 Other chronic pain: Secondary | ICD-10-CM

## 2020-01-12 LAB — CBC
HCT: 39.3 % (ref 39.0–52.0)
Hemoglobin: 12.9 g/dL — ABNORMAL LOW (ref 13.0–17.0)
MCH: 34.6 pg — ABNORMAL HIGH (ref 26.0–34.0)
MCHC: 32.8 g/dL (ref 30.0–36.0)
MCV: 105.4 fL — ABNORMAL HIGH (ref 80.0–100.0)
Platelets: 179 10*3/uL (ref 150–400)
RBC: 3.73 MIL/uL — ABNORMAL LOW (ref 4.22–5.81)
RDW: 14 % (ref 11.5–15.5)
WBC: 8.6 10*3/uL (ref 4.0–10.5)
nRBC: 0 % (ref 0.0–0.2)

## 2020-01-12 LAB — BASIC METABOLIC PANEL
Anion gap: 7 (ref 5–15)
BUN: 18 mg/dL (ref 8–23)
CO2: 25 mmol/L (ref 22–32)
Calcium: 9 mg/dL (ref 8.9–10.3)
Chloride: 105 mmol/L (ref 98–111)
Creatinine, Ser: 0.92 mg/dL (ref 0.61–1.24)
GFR calc Af Amer: >60 mL/min (ref >60)
GFR calc non Af Amer: >60 mL/min (ref >60)
Glucose, Bld: 132 mg/dL — ABNORMAL HIGH (ref 70–99)
Potassium: 4.2 mmol/L (ref 3.5–5.1)
Sodium: 137 mmol/L (ref 135–145)

## 2020-01-12 LAB — TROPONIN I (HIGH SENSITIVITY)
Troponin I (High Sensitivity): 5 ng/L (ref <18)
Troponin I (High Sensitivity): 6 ng/L (ref <18)

## 2020-01-12 IMAGING — DX DG CHEST 2V
2 series · 2 of 2 positions shown · non-contrast
Comparison: [DATE]

CLINICAL DATA: Chest pain.

EXAM:
CHEST - 2 VIEW

[chest pa]
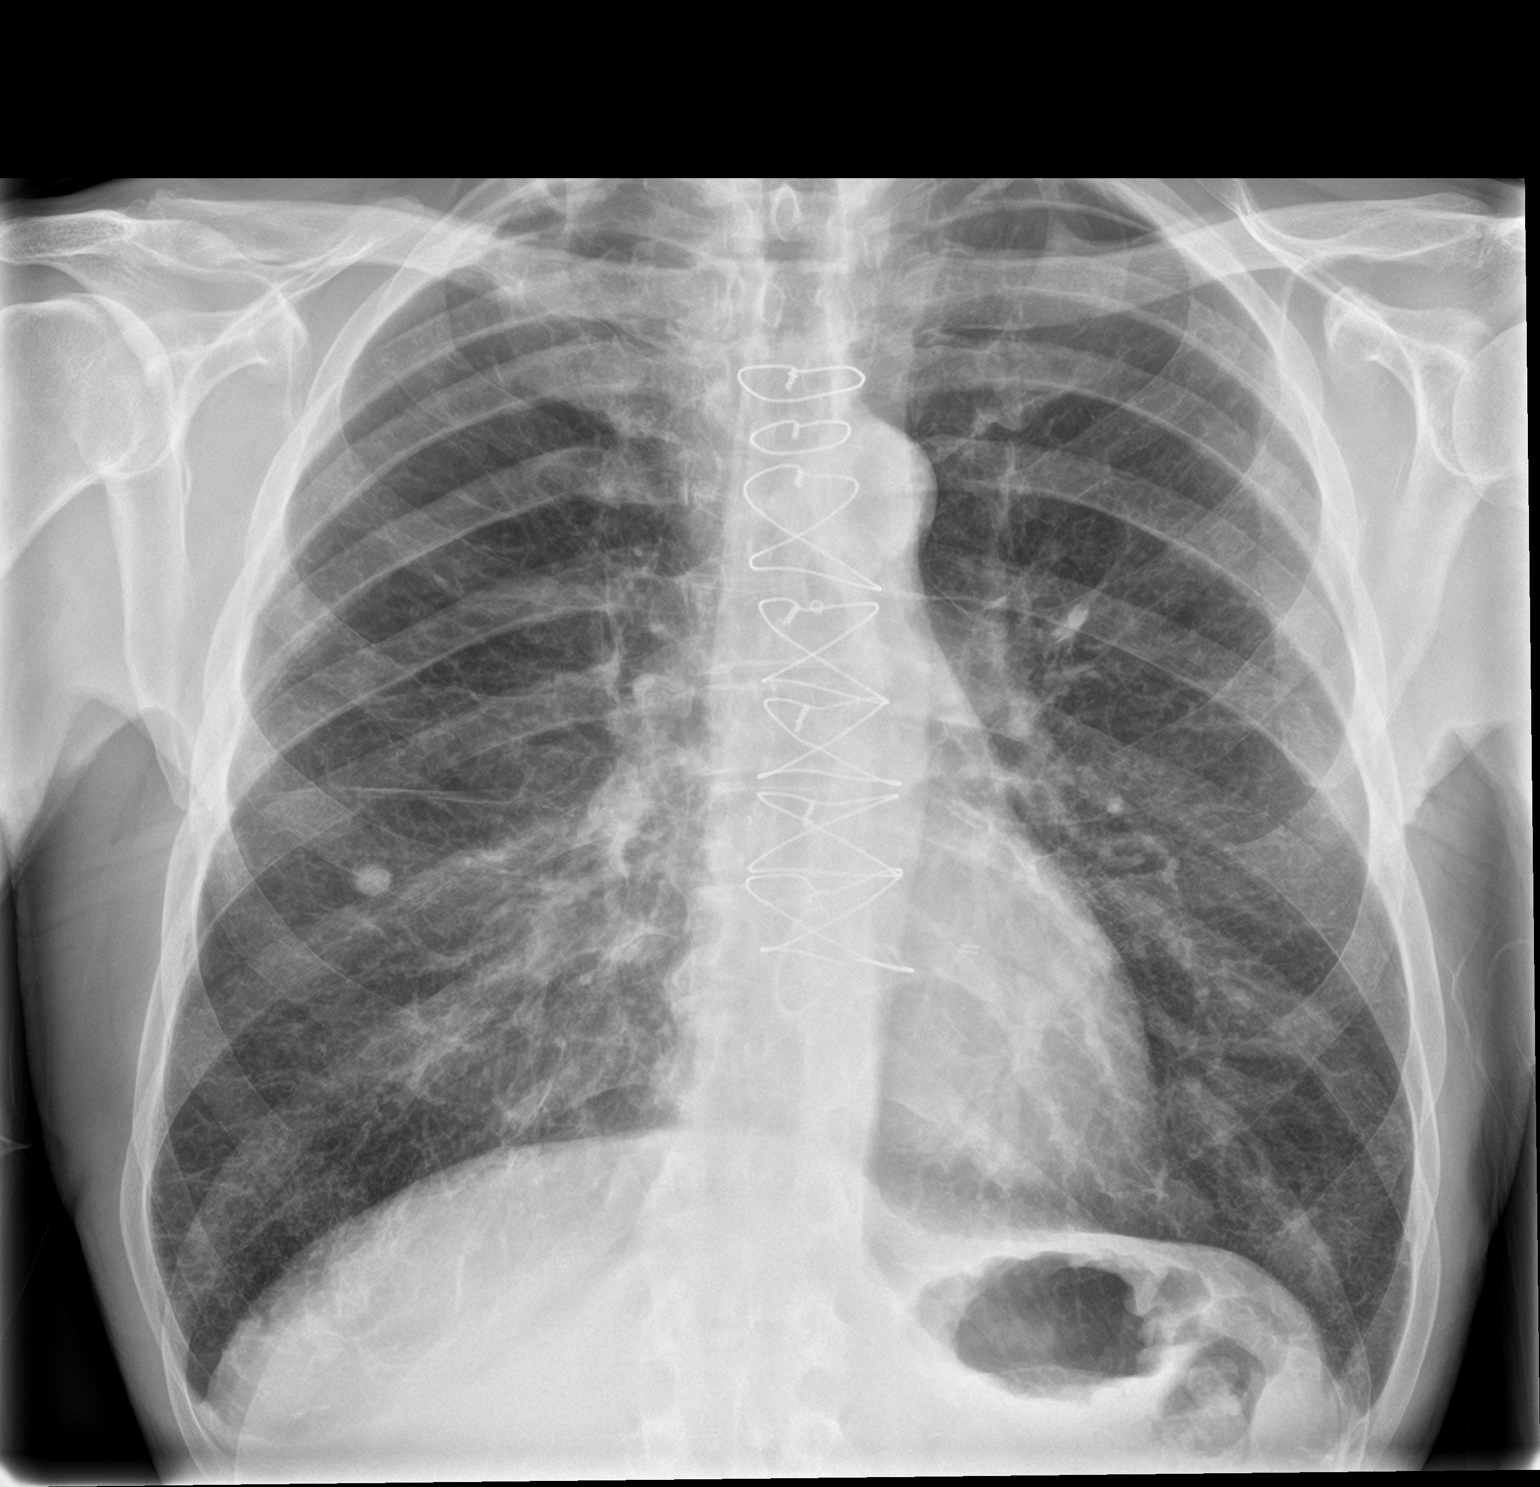

[chest lat]
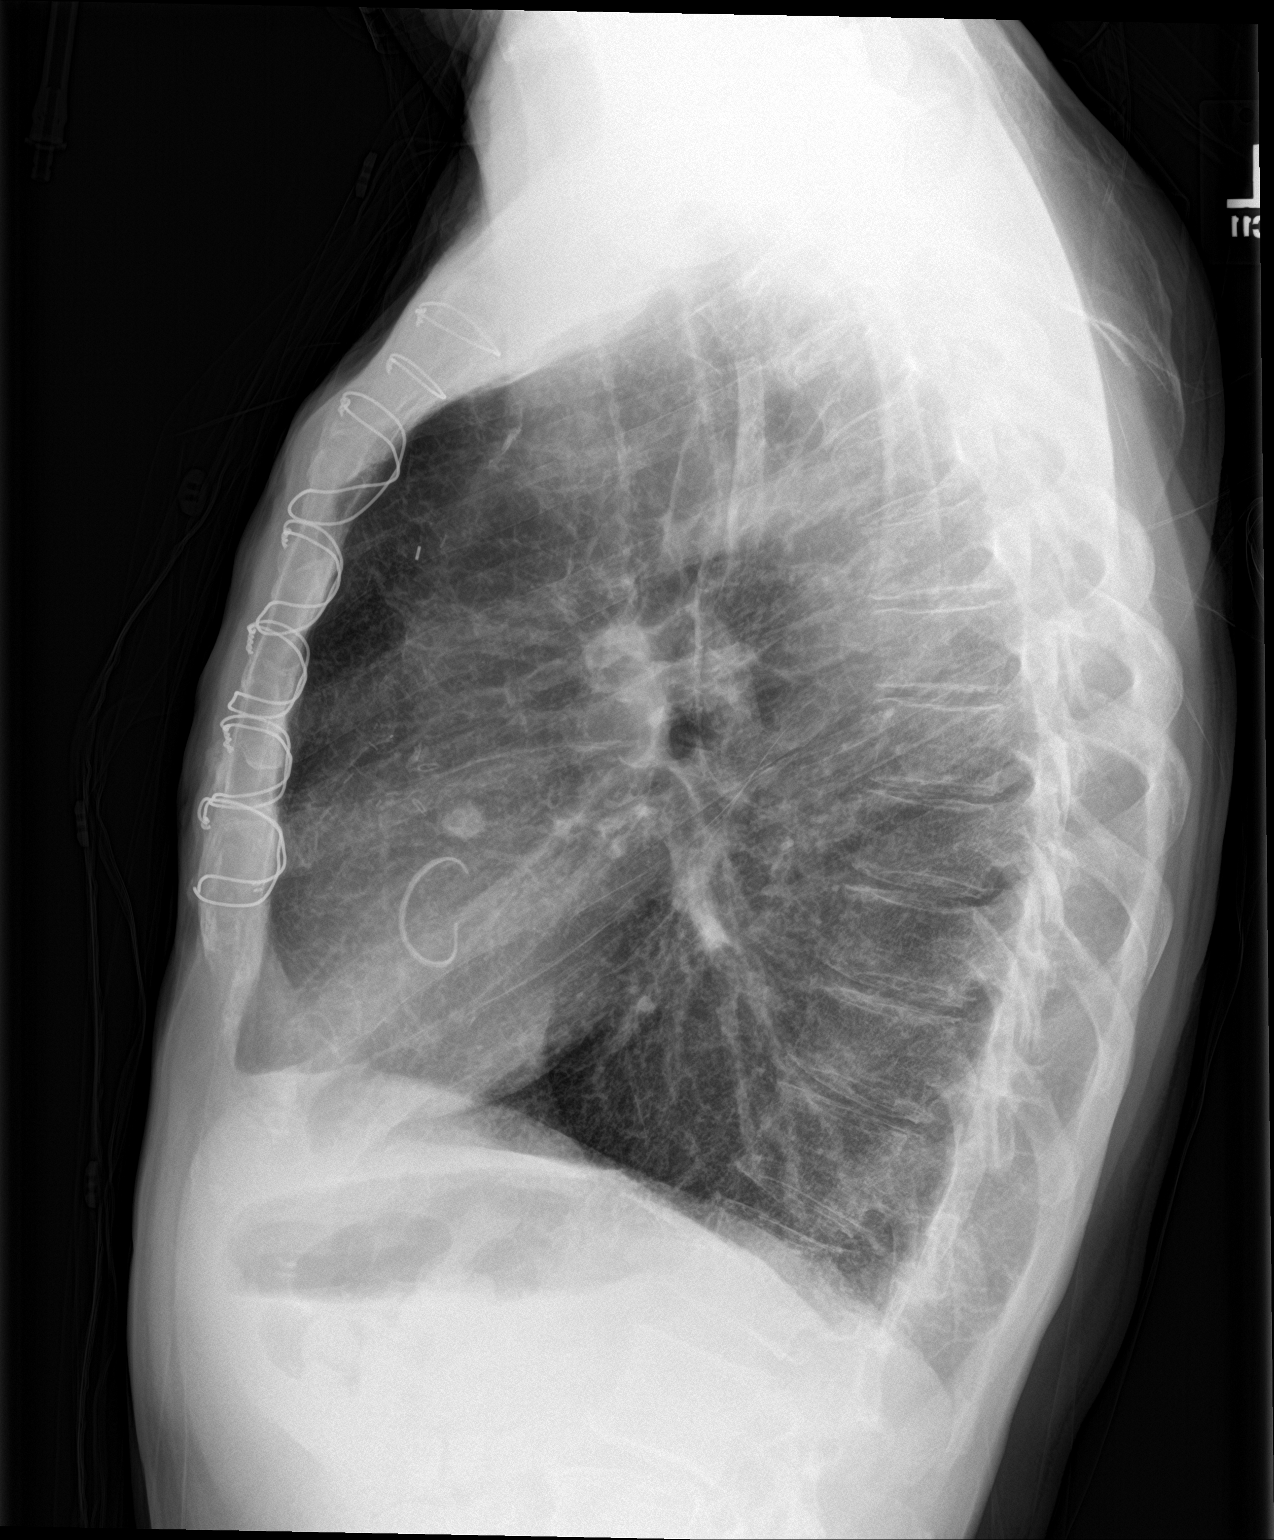

[2 of 2 positions shown; findings below may reference images not displayed]

FINDINGS: A calcified nodule seen in the right lung consistent with a
granuloma. No pneumothorax. No nodules or masses. No focal
infiltrates. Hyperinflation of the lungs persists. Mild interstitial
prominence. The heart, hila, and mediastinum are unremarkable.
IMPRESSION: Findings are consistent with emphysematous changes. No acute
abnormalities are identified.

## 2020-01-12 NOTE — ED Triage Notes (Signed)
Patient c/o chest  And right side jaw pain that began yesterday. Patient has a hx of open heart surgery.

## 2020-01-12 NOTE — Discharge Instructions (Signed)
Follow-up with Dr. Valda Favia [phone],  call for an appointment

## 2020-01-12 NOTE — ED Provider Notes (Signed)
[facility] EMERGENCY DEPARTMENT Provider Note   CSN: [id number] Arrival date & time: 01/12/20  1524     History Chief Complaint  Patient presents with  . Chest Pain    [name] is a 69 y.o. male.  Patient complains of right lower jaw pain has been going on for months.  He was told by his dentist that he need to see an oral surgeon  The history is provided by the patient. No language interpreter was used.  Facial Injury Injury mechanism: Patient with right lower jaw pain. Location:  Face Pain details:    Quality:  Aching   Severity:  Moderate   Timing:  Constant   Progression:  Worsening Foreign body present:  No foreign bodies Relieved by:  Nothing Worsened by:  Nothing Ineffective treatments:  None tried Associated symptoms: no altered mental status, no congestion and no headaches        Past Medical History:  Diagnosis Date  . AAA (abdominal aortic aneurysm) ([place])   . Anxiety   . Arthritis   . COPD (chronic obstructive pulmonary disease) ([place])   . Coronary artery disease involving native coronary artery with angina pectoris ([place]) 01/2015   100% LAD, Severe dom RCA dz, small non-dom Cx --> CABG x 2 LIMA-LAD, SVG-RCA  . MI (myocardial infarction) ([place])   . Pulmonary nodule    benign    Patient Active Problem List   Diagnosis Date Noted  . Pulmonary nodule 08/14/2018  . Unstable angina ([place])   . Gastroesophageal reflux disease   . Nausea without vomiting   . Elevated MCV 05/24/2017  . COPD (chronic obstructive pulmonary disease) ([place]) 05/23/2017  . Anxiety 05/23/2017  . Angina, class II ([place]) 11/11/2016  . Abnormal nuclear stress test 11/11/2016  . Chest pain, non-cardiac 10/09/2016  . Coronary artery disease involving native heart with angina pectoris ([place]) 10/09/2016  . Tobacco abuse 10/09/2016  . Pure hypercholesterolemia 03/21/2015  . 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 AAA (abdominal aortic aneurysm) without rupture ([place])  03/01/2014    Past Surgical History:  Procedure Laterality Date  . BIOPSY  09/08/2018   Procedure: BIOPSY;  Surgeon: Danie Binder, MD;  [facility];  Service: Endoscopy;;  gastric  . CARDIAC CATHETERIZATION  01/2015   Coronary angiography on 02/17/15 demonstrated the left main coronary artery to have mild 30-40% stenosis, 100% LAD stenosis with right to left collaterals and faint left to left collaterals, small nondominant left circumflex, small to medium caliber first obtuse marginal branch with 20% ostial and proximal stenosis. The RCA was noted to have diffuse disease.  Marland Kitchen CARDIAC CATHETERIZATION N/A 11/11/2016   Procedure: Left Heart Cath and Cors/Grafts Angiography;  Surgeon: Leonie Man, MD;  Location: [facility];  Service: Cardiovascular;  Laterality: N/A;  . CORONARY ARTERY BYPASS GRAFT  01/2015  . ESOPHAGOGASTRODUODENOSCOPY (EGD) WITH PROPOFOL N/A 09/08/2018   Procedure: ESOPHAGOGASTRODUODENOSCOPY (EGD) WITH PROPOFOL;  Surgeon: Danie Binder, MD;  Location[facility];  Service: Endoscopy;  Laterality: N/A;  2:45pm  . INGUINAL HERNIA REPAIR Bilateral   . VALVE REPLACEMENT  01/2015   Tricuspid Valve Replacement       Family History  Problem Relation Age of Onset  . Heart disease Father   . Heart attack Sister        Died in her 40s  . CAD Brother   . Heart attack Brother   . CAD Brother   . Heart attack  Brother     Social History   Tobacco Use  . Smoking status: Current Every Day Smoker    Packs/day: 2.00    Years: 60.00    Pack years: 120.00    Types: Cigarettes    Last attempt to quit: 02/11/2015    Years since quitting: 4.9  . Smokeless tobacco: Never Used  Substance Use Topics  . Alcohol use: Not Currently    Alcohol/week: 1.0 standard drinks    Types: 1 Cans of beer per week    Comment: one beer a week  . Drug use: No    Home Medications Prior to Admission medications   Medication Sig Start Date End Date Taking? Authorizing  Provider  ALPRAZolam Prudy Feeler) 1 MG tablet Take 1 mg by mouth 4 (four) times daily as needed for anxiety.  09/17/16   [provider]  aspirin EC 81 MG tablet Take 81 mg by mouth daily.     [provider]  atorvastatin (LIPITOR) 20 MG tablet Take 1 tablet (20 mg total) by mouth daily at 6 PM. 08/14/18 12/23/19  Johnson, Clanford L, MD  HYDROcodone-acetaminophen (NORCO) 7.5-325 MG tablet Take 1 tablet by mouth every 6 (six) hours as needed for moderate pain.  08/16/16   [provider]  nitroGLYCERIN (NITROSTAT) 0.4 MG SL tablet PLACE 1 TABLET UNDER THE TONGUE EVERY 5 MINUTES AS NEEDED FOR CHEST PAIN Patient not taking: No sig reported 02/11/18   Jodelle Gross, NP  pantoprazole (PROTONIX) 40 MG tablet Take 1 tablet (40 mg total) by mouth 2 (two) times daily before a meal. 08/14/18 12/23/19  Johnson, Clanford L, MD  zolpidem (AMBIEN) 10 MG tablet Take 10 mg by mouth at bedtime as needed for sleep.  07/27/18   [provider]    Allergies    Patient has no known allergies.  Review of Systems   Review of Systems  Constitutional: Negative for appetite change and fatigue.  HENT: Negative for congestion, ear discharge and sinus pressure.        Pain  Eyes: Negative for discharge.  Respiratory: Negative for cough.   Cardiovascular: Negative for chest pain.  Gastrointestinal: Negative for abdominal pain and diarrhea.  Genitourinary: Negative for frequency and hematuria.  Musculoskeletal: Negative for back pain.  Skin: Negative for rash.  Neurological: Negative for seizures and headaches.  Psychiatric/Behavioral: Negative for hallucinations.    Physical Exam Updated Vital Signs BP 120/68 (BP Location: Left Arm)   Pulse 90   Temp 98.3 F (36.8 C) (Oral)   Resp 15   SpO2 96%   Physical Exam Vitals and nursing note reviewed.  Constitutional:      Appearance: He is well-developed.  HENT:     Head: Normocephalic.     Nose: Nose normal.      Mouth/Throat:     Comments: Tenderness in right lower jaw Eyes:     General: No scleral icterus.    Conjunctiva/sclera: Conjunctivae normal.  Neck:     Thyroid: No thyromegaly.  Cardiovascular:     Rate and Rhythm: Normal rate and regular rhythm.     Heart sounds: No murmur. No friction rub. No gallop.   Pulmonary:     Breath sounds: No stridor. No wheezing or rales.  Chest:     Chest wall: No tenderness.  Abdominal:     General: There is no distension.     Tenderness: There is no abdominal tenderness. There is no rebound.  Musculoskeletal:  General: Normal range of motion.     Cervical back: Neck supple.  Lymphadenopathy:     Cervical: No cervical adenopathy.  Skin:    Findings: No erythema or rash.  Neurological:     Mental Status: He is alert and oriented to person, place, and time.     Motor: No abnormal muscle tone.     Coordination: Coordination normal.  Psychiatric:        Behavior: Behavior normal.     ED Results / Procedures / Treatments   Labs (all labs ordered are listed, but only abnormal results are displayed) Labs Reviewed  BASIC METABOLIC PANEL - Abnormal; Notable for the following components:      Result Value   Glucose, Bld 132 (*)    All other components within normal limits  CBC - Abnormal; Notable for the following components:   RBC 3.73 (*)    Hemoglobin 12.9 (*)    MCV 105.4 (*)    MCH 34.6 (*)    All other components within normal limits  TROPONIN I (HIGH SENSITIVITY)  TROPONIN I (HIGH SENSITIVITY)    EKG EKG Interpretation  Date/Time:  Wednesday January 12 2020 15:36:28 EDT Ventricular Rate:  90 PR Interval:  154 QRS Duration: 90 QT Interval:  366 QTC Calculation: 447 R Axis:   107 Text Interpretation: Normal sinus rhythm Rightward axis Anteroseptal infarct , age undetermined Abnormal ECG Confirmed by Bethann Berkshire [phone]) on 01/12/2020 6:13:11 PM   Radiology DG Chest 2 View  Result Date: 01/12/2020 CLINICAL DATA:  Chest  pain. EXAM: CHEST - 2 VIEW COMPARISON:  January 15, 2018 FINDINGS: A calcified nodule seen in the right lung consistent with a granuloma. No pneumothorax. No nodules or masses. No focal infiltrates. Hyperinflation of the lungs persists. Mild interstitial prominence. The heart, hila, and mediastinum are unremarkable. IMPRESSION: Findings are consistent with emphysematous changes. No acute abnormalities are identified. Electronically Signed   By: Gerome Sam III M.D   On: 01/12/2020 15:54    Procedures Procedures (including critical care time)  Medications Ordered in ED Medications - No data to display  ED Course  I have reviewed the triage vital signs and the nursing notes.  Pertinent labs & imaging results that were available during my care of the patient were reviewed by me and considered in my medical decision making (see chart for details).    MDM Rules/Calculators/A&P                      Patient was worked up for cardiac cause of jaw pain.  It is not related to his heart.  He saw a dentist who feels like he needs to see an oral surgeon we will refer him to our oral surgeon on call Final Clinical Impression(s) / ED Diagnoses Final diagnoses:  Chronic jaw pain    Rx / DC Orders ED Discharge Orders    None       Bethann Berkshire, MD 01/12/20 [phone]

## 2020-03-09 ENCOUNTER — Ambulatory Visit: Payer: Medicare Other | Admitting: General Surgery

## 2020-03-09 ENCOUNTER — Encounter: Payer: Self-pay | Admitting: General Surgery

## 2020-03-09 ENCOUNTER — Other Ambulatory Visit: Payer: Self-pay

## 2020-03-09 VITALS — BP 126/72 | HR 60 | Temp 97.8°F | Ht 66.0 in | Wt 126.0 lb

## 2020-03-09 DIAGNOSIS — K4091 Unilateral inguinal hernia, without obstruction or gangrene, recurrent: Secondary | ICD-10-CM

## 2020-03-09 NOTE — Patient Instructions (Signed)

## 2020-03-10 NOTE — Progress Notes (Signed)
[name]; [id number]; 01/14/1951   HPI Patient is a 69 year old white male who was referred to my care by Dr. Sudie Bailey for evaluation treatment of a right inguinal hernia.  Patient is status post bilateral inguinal herniorrhaphies in the remote past.  He states that over the last 3 months, he has developed intermittent pain with a lump in the right groin region.  Is made worse with straining.  No nausea has been noted.  It is somewhat aggravated with work, but he is not lifting something heavy.  He has 0 out of 10 abdominal pain.  The pain stays localized to the right groin region.  He is able to reduce it when he lies down. Past Medical History:  Diagnosis Date  . AAA (abdominal aortic aneurysm) ([facility])   . Anxiety   . Arthritis   . COPD (chronic obstructive pulmonary disease) ([facility])   . 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 MI (myocardial infarction) ([facility])   . Pulmonary nodule    benign    Past Surgical History:  Procedure Laterality Date  . BIOPSY  09/08/2018   Procedure: BIOPSY;  Surgeon: West Bali, MD;  [facility];  Service: Endoscopy;;  gastric  . CARDIAC CATHETERIZATION  01/2015   Coronary angiography on 02/17/15 demonstrated the left main coronary artery to have mild 30-40% stenosis, 100% LAD stenosis with right to left collaterals and faint left to left collaterals, small nondominant left circumflex, small to medium caliber first obtuse marginal branch with 20% ostial and proximal stenosis. The RCA was noted to have diffuse disease.  Marland Kitchen CARDIAC CATHETERIZATION N/A 11/11/2016   Procedure: Left Heart Cath and Cors/Grafts Angiography;  Surgeon: Marykay Lex, MD;  Location: [facility] IN[facility];  Service: Cardiovascular;  Laterality: N/A;  . CORONARY ARTERY BYPASS GRAFT  01/2015  . ESOPHAGOGASTRODUODENOSCOPY (EGD) WITH PROPOFOL N/A  09/08/2018   Procedure: ESOPHAGOGASTRODUODENOSCOPY (EGD) WITH PROPOFOL;  Surgeon: West Bali, MD;  [facility];  Service: Endoscopy;  Laterality: N/A;  2:45pm  . INGUINAL HERNIA REPAIR Bilateral   . VALVE REPLACEMENT  01/2015   Tricuspid Valve Replacement    Family History  Problem Relation Age of Onset  . Heart disease Father   . Heart attack Sister        Died in her 38s  . CAD Brother   . Heart attack Brother   . CAD Brother   . Heart attack Brother     Current Outpatient Medications on File Prior to Visit  Medication Sig Dispense Refill  . ALPRAZolam (XANAX) 1 MG tablet Take 1 mg by mouth 4 (four) times daily as needed for anxiety.   2  . aspirin EC 81 MG tablet Take 81 mg by mouth daily.     Marland Kitchen HYDROcodone-acetaminophen (NORCO) 7.5-325 MG tablet Take 1 tablet by mouth every 6 (six) hours as needed for moderate pain.   0  . zolpidem (AMBIEN) 10 MG tablet Take 10 mg by mouth at bedtime as needed for sleep.   1  . nitroGLYCERIN (NITROSTAT) 0.4 MG SL tablet PLACE 1 TABLET UNDER THE TONGUE EVERY 5 MINUTES AS NEEDED FOR CHEST PAIN (Patient not taking: No sig reported) 100 tablet 0  . pantoprazole (PROTONIX) 40 MG tablet Take 1 tablet (40 mg total) by mouth 2 (two) times daily before a meal. 60 tablet  0   No current facility-administered medications on file prior to visit.    No Known Allergies  Social History   Substance and Sexual Activity  Alcohol Use Not Currently  . Alcohol/week: 1.0 standard drinks  . Types: 1 Cans of beer per week   Comment: one beer a week    Social History   Tobacco Use  Smoking Status Current Every Day Smoker  . Packs/day: 2.00  . Years: 60.00  . Pack years: 120.00  . Types: Cigarettes  . Last attempt to quit: 02/11/2015  . Years since quitting: 5.0  Smokeless Tobacco Never Used    Review of Systems  Constitutional: Negative.   HENT: Negative.   Eyes: Negative.   Respiratory: Negative.   Cardiovascular: Negative.    Gastrointestinal: Negative.   Genitourinary: Negative.   Musculoskeletal: Negative.   Skin: Negative.   Neurological: Negative.   Endo/Heme/Allergies: Negative.   Psychiatric/Behavioral: Negative.     Objective   Vitals:   03/09/20 0950  BP: 126/72  Pulse: 60  Temp: 97.8 F (36.6 C)  SpO2: 97%    Physical Exam Vitals reviewed.  Constitutional:      Appearance: Normal appearance. He is normal weight. He is not ill-appearing.  HENT:     Head: Normocephalic and atraumatic.  Cardiovascular:     Rate and Rhythm: Normal rate and regular rhythm.     Heart sounds: Normal heart sounds. No murmur. No friction rub. No gallop.   Pulmonary:     Effort: Pulmonary effort is normal. No respiratory distress.     Breath sounds: Normal breath sounds. No stridor. No wheezing, rhonchi or rales.  Abdominal:     General: Abdomen is flat. Bowel sounds are normal. There is no distension.     Palpations: Abdomen is soft. There is no mass.     Tenderness: There is no abdominal tenderness. There is no guarding or rebound.     Hernia: A hernia is present.     Comments: A small easily reducible inguinal hernias noted just lateral to the pubic tubercle.  I am able to palpate firmness along the inguinal canals consistent with previous mesh placement.  Skin:    General: Skin is warm and dry.  Neurological:     Mental Status: He is alert and oriented to person, place, and time.    Primary care notes reviewed Assessment  Recurrent right inguinal hernia.  His recurrence is most likely along the medial floor of the inguinal canal.  No bowel is present. Plan   Unless the patient becomes more symptomatic, I told him that I would not recommend surgical repair given his comorbidities and recurrent hernia repair could result in more postoperative complications.  He understands this and agrees.  He will follow-up with me as needed.  He does not want surgical repair unless absolutely necessary.

## 2020-04-13 DIAGNOSIS — G500 Trigeminal neuralgia: Secondary | ICD-10-CM | POA: Insufficient documentation

## 2020-08-11 ENCOUNTER — Other Ambulatory Visit: Payer: Self-pay

## 2020-08-11 ENCOUNTER — Encounter (HOSPITAL_COMMUNITY): Payer: Self-pay | Admitting: *Deleted

## 2020-08-11 ENCOUNTER — Emergency Department (HOSPITAL_COMMUNITY)
Admission: EM | Admit: 2020-08-11 | Discharge: 2020-08-11 | Disposition: A | Discharge status: Home / Self Care | Payer: Medicare Other | Attending: Emergency Medicine | Admitting: Emergency Medicine

## 2020-08-11 ENCOUNTER — Emergency Department (HOSPITAL_COMMUNITY): Payer: Medicare Other

## 2020-08-11 DIAGNOSIS — R079 Chest pain, unspecified: Secondary | ICD-10-CM | POA: Insufficient documentation

## 2020-08-11 DIAGNOSIS — Z20822 Contact with and (suspected) exposure to covid-19: Secondary | ICD-10-CM | POA: Diagnosis not present

## 2020-08-11 DIAGNOSIS — Z5321 Procedure and treatment not carried out due to patient leaving prior to being seen by health care provider: Secondary | ICD-10-CM | POA: Diagnosis not present

## 2020-08-11 LAB — TROPONIN I (HIGH SENSITIVITY)
Troponin I (High Sensitivity): 6 ng/L (ref <18)
Troponin I (High Sensitivity): 7 ng/L (ref <18)

## 2020-08-11 LAB — RESPIRATORY PANEL BY RT PCR (FLU A&B, COVID)
Influenza A by PCR: NEGATIVE
Influenza B by PCR: NEGATIVE
SARS Coronavirus 2 by RT PCR: NEGATIVE

## 2020-08-11 LAB — CBC
HCT: 36.4 % — ABNORMAL LOW (ref 39.0–52.0)
Hemoglobin: 12 g/dL — ABNORMAL LOW (ref 13.0–17.0)
MCH: 35.4 pg — ABNORMAL HIGH (ref 26.0–34.0)
MCHC: 33 g/dL (ref 30.0–36.0)
MCV: 107.4 fL — ABNORMAL HIGH (ref 80.0–100.0)
Platelets: 188 10*3/uL (ref 150–400)
RBC: 3.39 MIL/uL — ABNORMAL LOW (ref 4.22–5.81)
RDW: 13.2 % (ref 11.5–15.5)
WBC: 5.9 10*3/uL (ref 4.0–10.5)
nRBC: 0 % (ref 0.0–0.2)

## 2020-08-11 LAB — BASIC METABOLIC PANEL
Anion gap: 9 (ref 5–15)
BUN: 14 mg/dL (ref 8–23)
CO2: 29 mmol/L (ref 22–32)
Calcium: 9.1 mg/dL (ref 8.9–10.3)
Chloride: 100 mmol/L (ref 98–111)
Creatinine, Ser: 0.9 mg/dL (ref 0.61–1.24)
GFR, Estimated: >60 mL/min (ref >60)
Glucose, Bld: 84 mg/dL (ref 70–99)
Potassium: 4 mmol/L (ref 3.5–5.1)
Sodium: 138 mmol/L (ref 135–145)

## 2020-08-11 LAB — GROUP A STREP BY PCR: Group A Strep by PCR: NOT DETECTED

## 2020-08-11 IMAGING — DX DG CHEST 2V
2 series · 2 of 2 positions shown · non-contrast
Comparison: [DATE]

CLINICAL DATA: Chest pain.

EXAM:
CHEST - 2 VIEW

[chest pa]
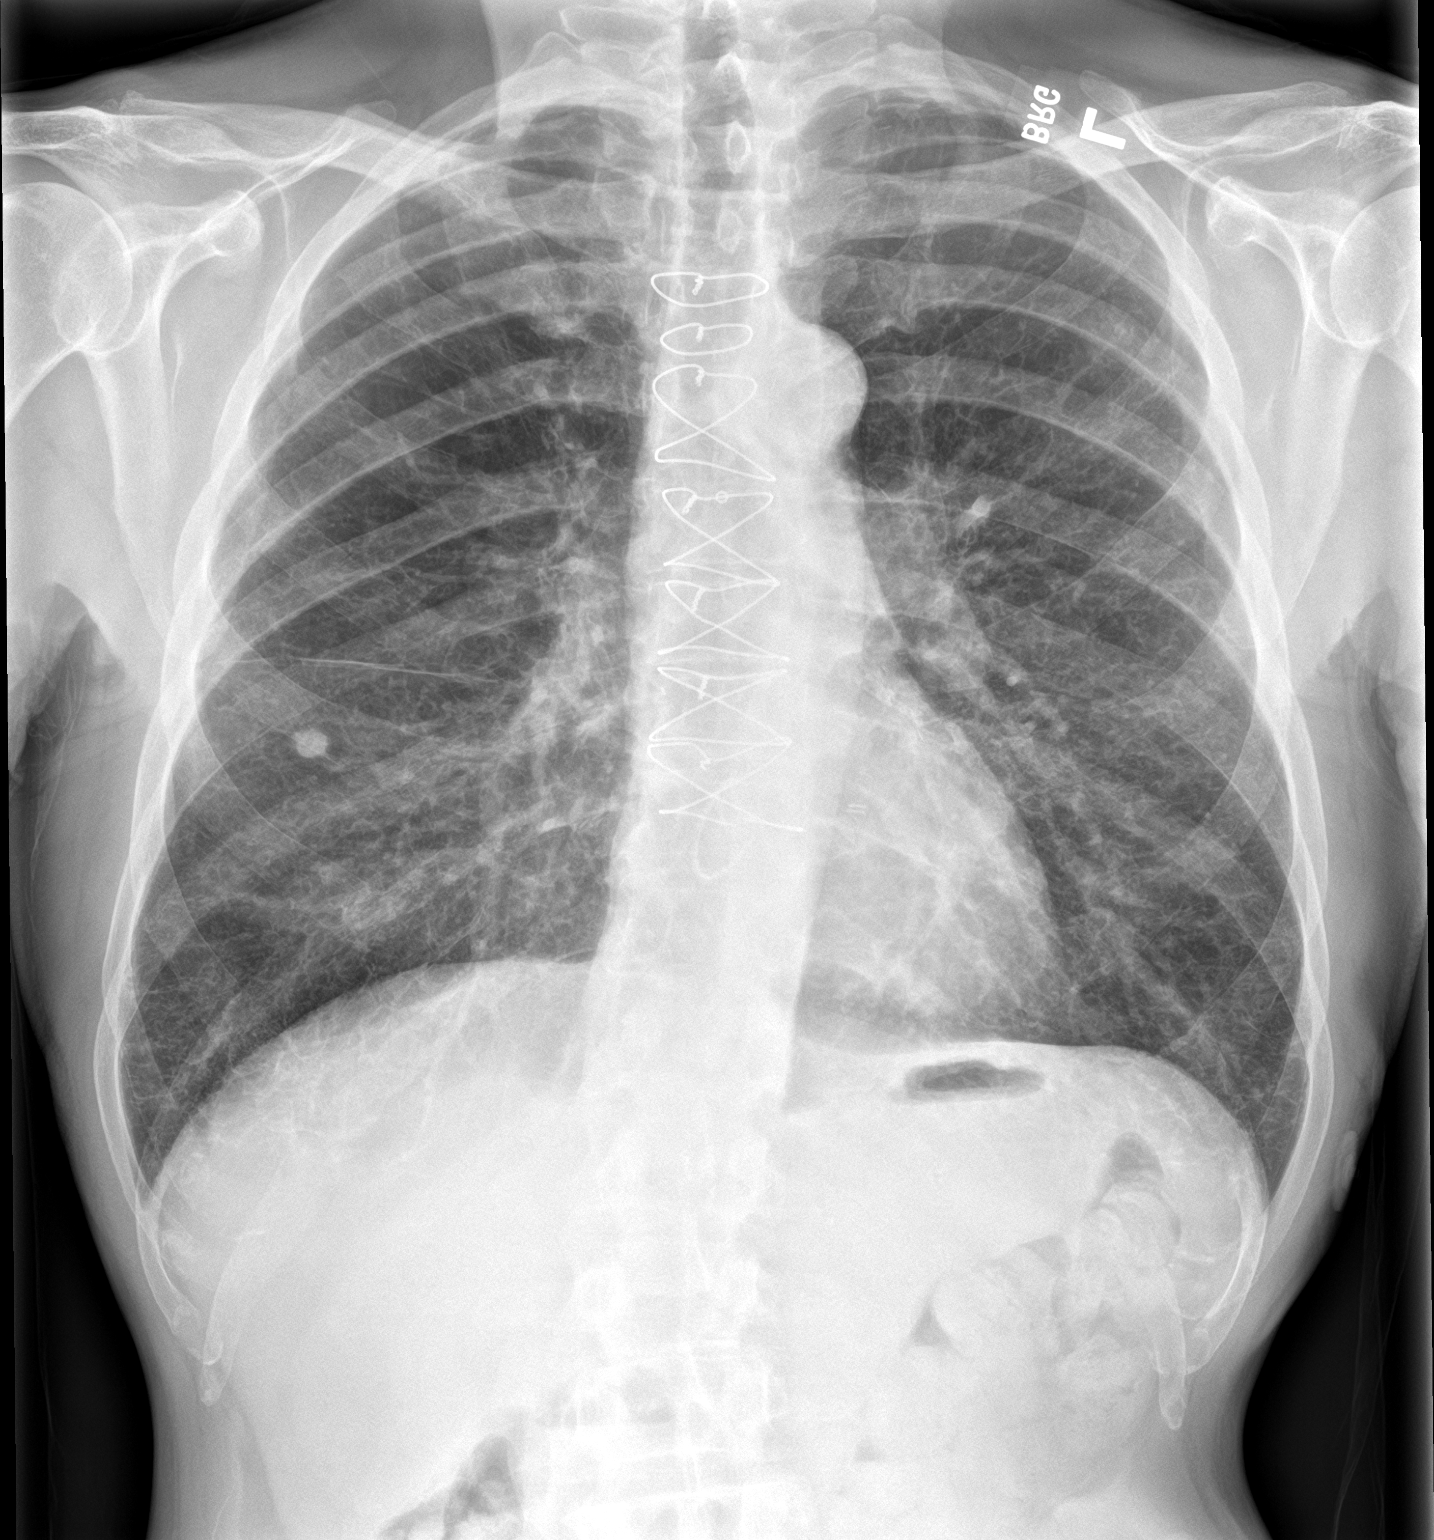

[chest lat]
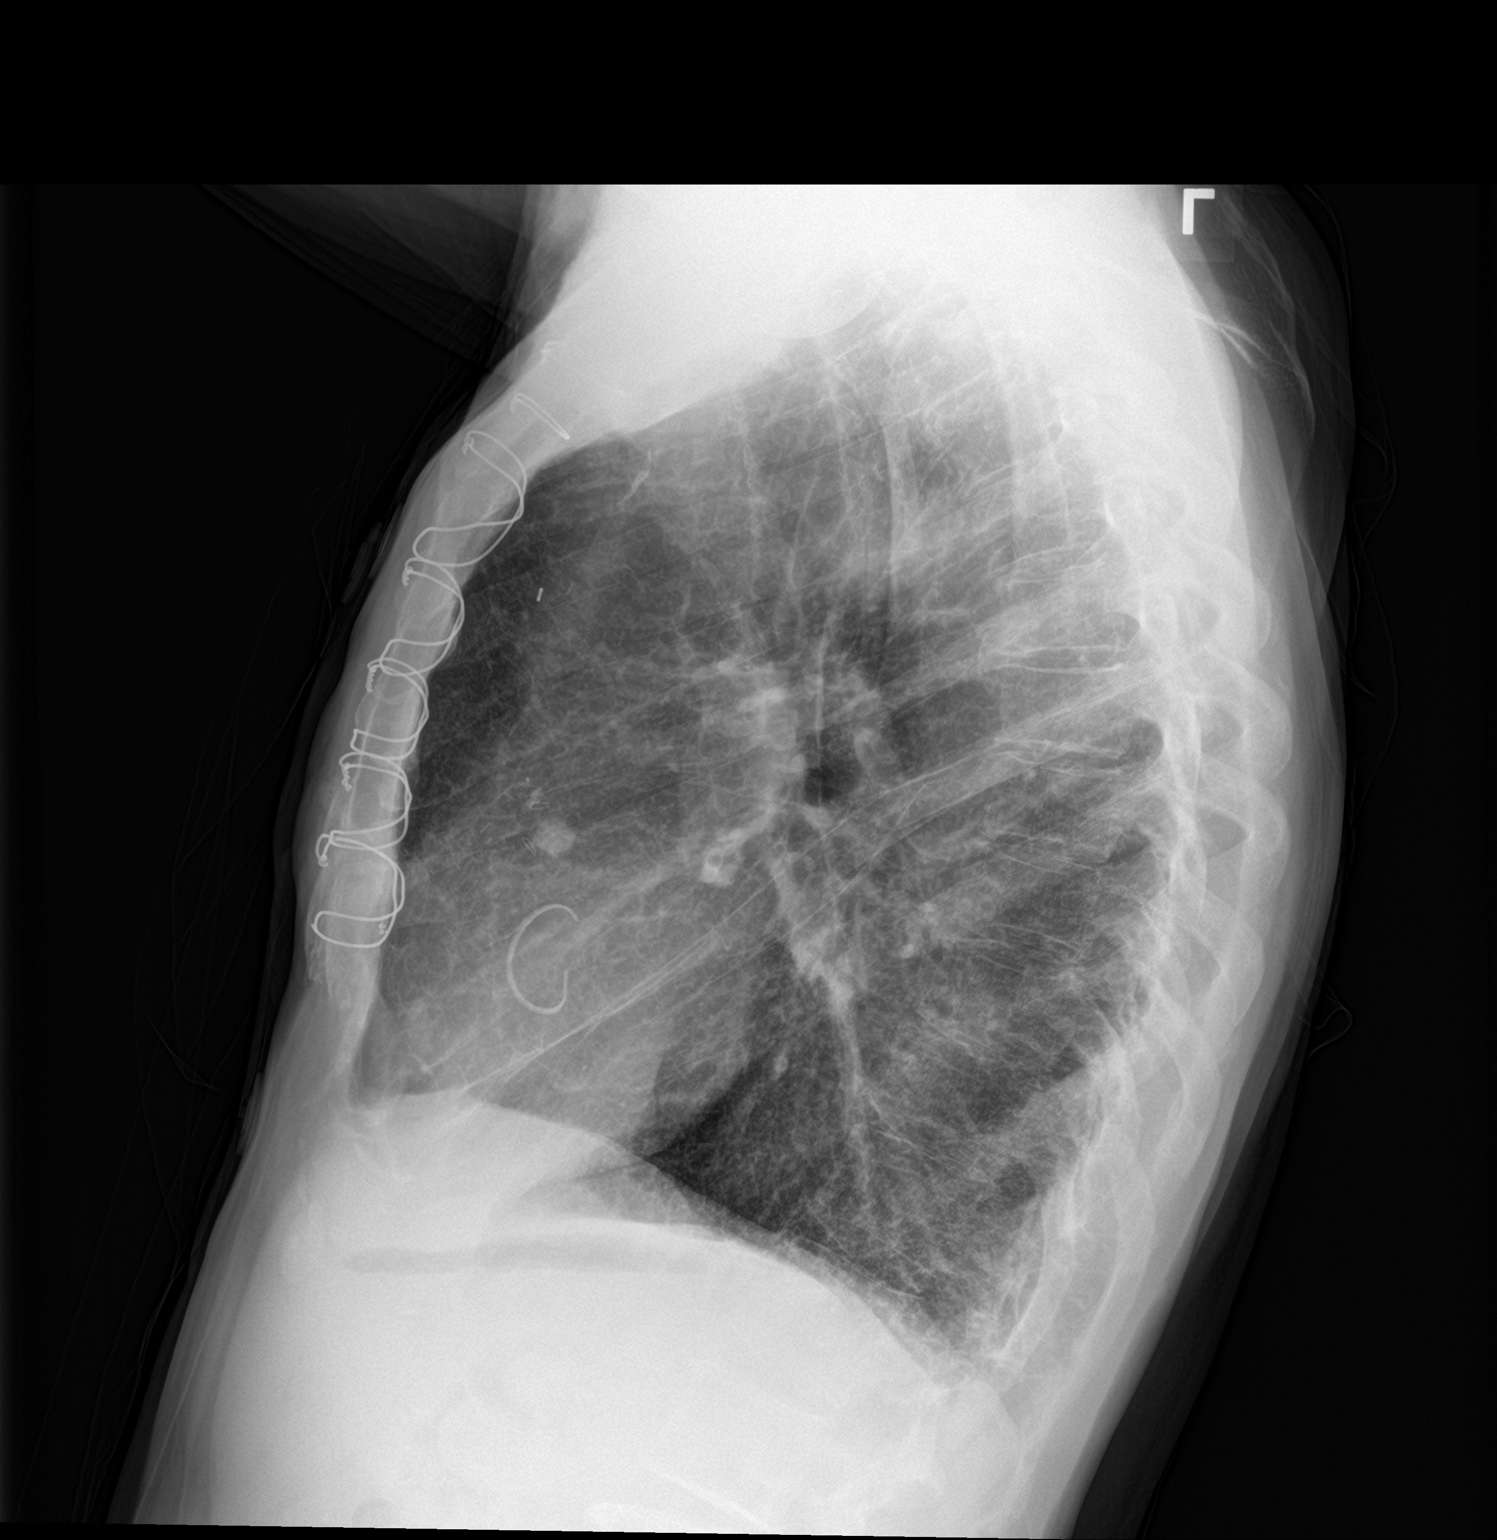

[2 of 2 positions shown; findings below may reference images not displayed]

FINDINGS: The heart size and mediastinal contours are within normal limits.
Similar calcified granuloma in the right lung. No consolidation. No
pleural effusions or pneumothorax. Similar chronic hyperinflation.
Similar mild diffuse interstitial prominence. No acute osseous
abnormality. Median sternotomy.
IMPRESSION: 1. No active cardiopulmonary disease.
2. Similar emphysematous change.

## 2020-08-11 NOTE — ED Triage Notes (Signed)
Chest pain onset yesterday 

## 2020-08-24 ENCOUNTER — Other Ambulatory Visit: Payer: Self-pay

## 2020-08-24 ENCOUNTER — Ambulatory Visit (HOSPITAL_COMMUNITY)
Admission: RE | Admit: 2020-08-24 | Discharge: 2020-08-24 | Disposition: A | Discharge status: Home / Self Care | Payer: Medicare Other | Source: Ambulatory Visit | Attending: Vascular Surgery | Admitting: Vascular Surgery

## 2020-08-24 ENCOUNTER — Encounter: Payer: Self-pay | Admitting: Vascular Surgery

## 2020-08-24 ENCOUNTER — Ambulatory Visit: Payer: Medicare Other | Admitting: Vascular Surgery

## 2020-08-24 VITALS — BP 116/70 | HR 74 | Temp 98.7°F | Resp 20 | Ht 66.0 in | Wt 130.0 lb

## 2020-08-24 DIAGNOSIS — I714 Abdominal aortic aneurysm, without rupture, unspecified: Secondary | ICD-10-CM

## 2020-08-24 NOTE — Progress Notes (Signed)
Patient is a 69 year old male who returns for follow-up today.  He was last seen March 2021.  He has a known abdominal aortic aneurysm.  Last diameter was 4.8 cm on ultrasound in March 2021.  He has no abdominal or back pain.  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  He is continuing to smoke.  He is not really interested in quitting.  Review of systems: He has no shortness of breath he has no claudication he has no chest pain  Past Medical History:  Diagnosis Date  . AAA (abdominal aortic aneurysm) ([facility])   . Anxiety   . Arthritis   . COPD (chronic obstructive pulmonary disease) ([facility])   . 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 MI (myocardial infarction) ([facility])   . Pulmonary nodule    benign    Past Surgical History:  Procedure Laterality Date  . BIOPSY  09/08/2018   Procedure: BIOPSY;  Surgeon: West Bali, MD;  [facility];  Service: Endoscopy;;  gastric  . CARDIAC CATHETERIZATION  01/2015   Coronary angiography on 02/17/15 demonstrated the left main coronary artery to have mild 30-40% stenosis, 100% LAD stenosis with right to left collaterals and faint left to left collaterals, small nondominant left circumflex, small to medium caliber first obtuse marginal branch with 20% ostial and proximal stenosis. The RCA was noted to have diffuse disease.  Marland Kitchen CARDIAC CATHETERIZATION N/A 11/11/2016   Procedure: Left Heart Cath and Cors/Grafts Angiography;  Surgeon: Marykay Lex, MD;  Location: [facility] INVASIVE CV LAB;  Service: Cardiovascular;  Laterality: N/A;  . CORONARY ARTERY BYPASS GRAFT  01/2015  . ESOPHAGOGASTRODUODENOSCOPY (EGD) WITH PROPOFOL N/A 09/08/2018   Procedure: ESOPHAGOGASTRODUODENOSCOPY (EGD) WITH PROPOFOL;  Surgeon: West Bali, MD;  [facility];  Service: Endoscopy;  Laterality: N/A;  2:45pm  . INGUINAL HERNIA  REPAIR Bilateral   . VALVE REPLACEMENT  01/2015   Tricuspid Valve Replacement   Physical exam:  Vitals:   08/24/20 0924  BP: 116/70  Pulse: 74  Resp: 20  Temp: 98.7 F (37.1 C)  SpO2: 93%  Weight: 130 lb (59 kg)  Height: 5\' 6"  (1.676 m)    Abdomen: Soft nontender nondistended easily palpable pulsatile epigastric mass  Extremities: 2+ right posterior tibial pulse absent pedal pulses left foot  Data: Patient had a duplex ultrasound of his aorta today which shows the aneurysm diameter is 4.9 cm.  This is essentially unchanged from 8 months ago.  I reviewed and interpreted the study.  Assessment: 4.9 cm abdominal aortic aneurysm currently asymptomatic.  Consideration for repair if this reaches 5-1/2 cm.  Plan: Patient will follow up with me with repeat aortic ultrasound in 6 months.  , MD [facility] Office: [phone]

## 2020-08-29 ENCOUNTER — Emergency Department (HOSPITAL_COMMUNITY)
Admission: EM | Admit: 2020-08-29 | Discharge: 2020-08-29 | Disposition: A | Discharge status: Home / Self Care | Payer: Medicare Other | Attending: Emergency Medicine | Admitting: Emergency Medicine

## 2020-08-29 ENCOUNTER — Emergency Department (HOSPITAL_COMMUNITY): Payer: Medicare Other

## 2020-08-29 ENCOUNTER — Other Ambulatory Visit: Payer: Self-pay

## 2020-08-29 ENCOUNTER — Encounter (HOSPITAL_COMMUNITY): Payer: Self-pay | Admitting: *Deleted

## 2020-08-29 DIAGNOSIS — I2510 Atherosclerotic heart disease of native coronary artery without angina pectoris: Secondary | ICD-10-CM | POA: Diagnosis not present

## 2020-08-29 DIAGNOSIS — R0789 Other chest pain: Secondary | ICD-10-CM

## 2020-08-29 DIAGNOSIS — R42 Dizziness and giddiness: Secondary | ICD-10-CM

## 2020-08-29 DIAGNOSIS — F17210 Nicotine dependence, cigarettes, uncomplicated: Secondary | ICD-10-CM | POA: Diagnosis not present

## 2020-08-29 DIAGNOSIS — Z7982 Long term (current) use of aspirin: Secondary | ICD-10-CM | POA: Diagnosis not present

## 2020-08-29 DIAGNOSIS — J069 Acute upper respiratory infection, unspecified: Secondary | ICD-10-CM | POA: Diagnosis not present

## 2020-08-29 DIAGNOSIS — Z951 Presence of aortocoronary bypass graft: Secondary | ICD-10-CM | POA: Diagnosis not present

## 2020-08-29 DIAGNOSIS — Z20822 Contact with and (suspected) exposure to covid-19: Secondary | ICD-10-CM | POA: Insufficient documentation

## 2020-08-29 DIAGNOSIS — J449 Chronic obstructive pulmonary disease, unspecified: Secondary | ICD-10-CM | POA: Insufficient documentation

## 2020-08-29 LAB — BASIC METABOLIC PANEL
Anion gap: 8 (ref 5–15)
BUN: 10 mg/dL (ref 8–23)
CO2: 25 mmol/L (ref 22–32)
Calcium: 9.1 mg/dL (ref 8.9–10.3)
Chloride: 103 mmol/L (ref 98–111)
Creatinine, Ser: 0.89 mg/dL (ref 0.61–1.24)
GFR, Estimated: >60 mL/min (ref >60)
Glucose, Bld: 148 mg/dL — ABNORMAL HIGH (ref 70–99)
Potassium: 3.7 mmol/L (ref 3.5–5.1)
Sodium: 136 mmol/L (ref 135–145)

## 2020-08-29 LAB — RESPIRATORY PANEL BY RT PCR (FLU A&B, COVID)
Influenza A by PCR: NEGATIVE
Influenza B by PCR: NEGATIVE
SARS Coronavirus 2 by RT PCR: NEGATIVE

## 2020-08-29 LAB — CBC
HCT: 39.4 % (ref 39.0–52.0)
Hemoglobin: 13.2 g/dL (ref 13.0–17.0)
MCH: 34.9 pg — ABNORMAL HIGH (ref 26.0–34.0)
MCHC: 33.5 g/dL (ref 30.0–36.0)
MCV: 104.2 fL — ABNORMAL HIGH (ref 80.0–100.0)
Platelets: 189 10*3/uL (ref 150–400)
RBC: 3.78 MIL/uL — ABNORMAL LOW (ref 4.22–5.81)
RDW: 12.9 % (ref 11.5–15.5)
WBC: 7.7 10*3/uL (ref 4.0–10.5)
nRBC: 0 % (ref 0.0–0.2)

## 2020-08-29 LAB — TROPONIN I (HIGH SENSITIVITY)
Troponin I (High Sensitivity): 5 ng/L (ref <18)
Troponin I (High Sensitivity): 6 ng/L (ref <18)

## 2020-08-29 IMAGING — DX DG CHEST 1V PORT
1 series · 1 of 1 positions shown · non-contrast
Comparison: [DATE].

CLINICAL DATA: Chest pain.

EXAM:
PORTABLE CHEST 1 VIEW

[chest ap]
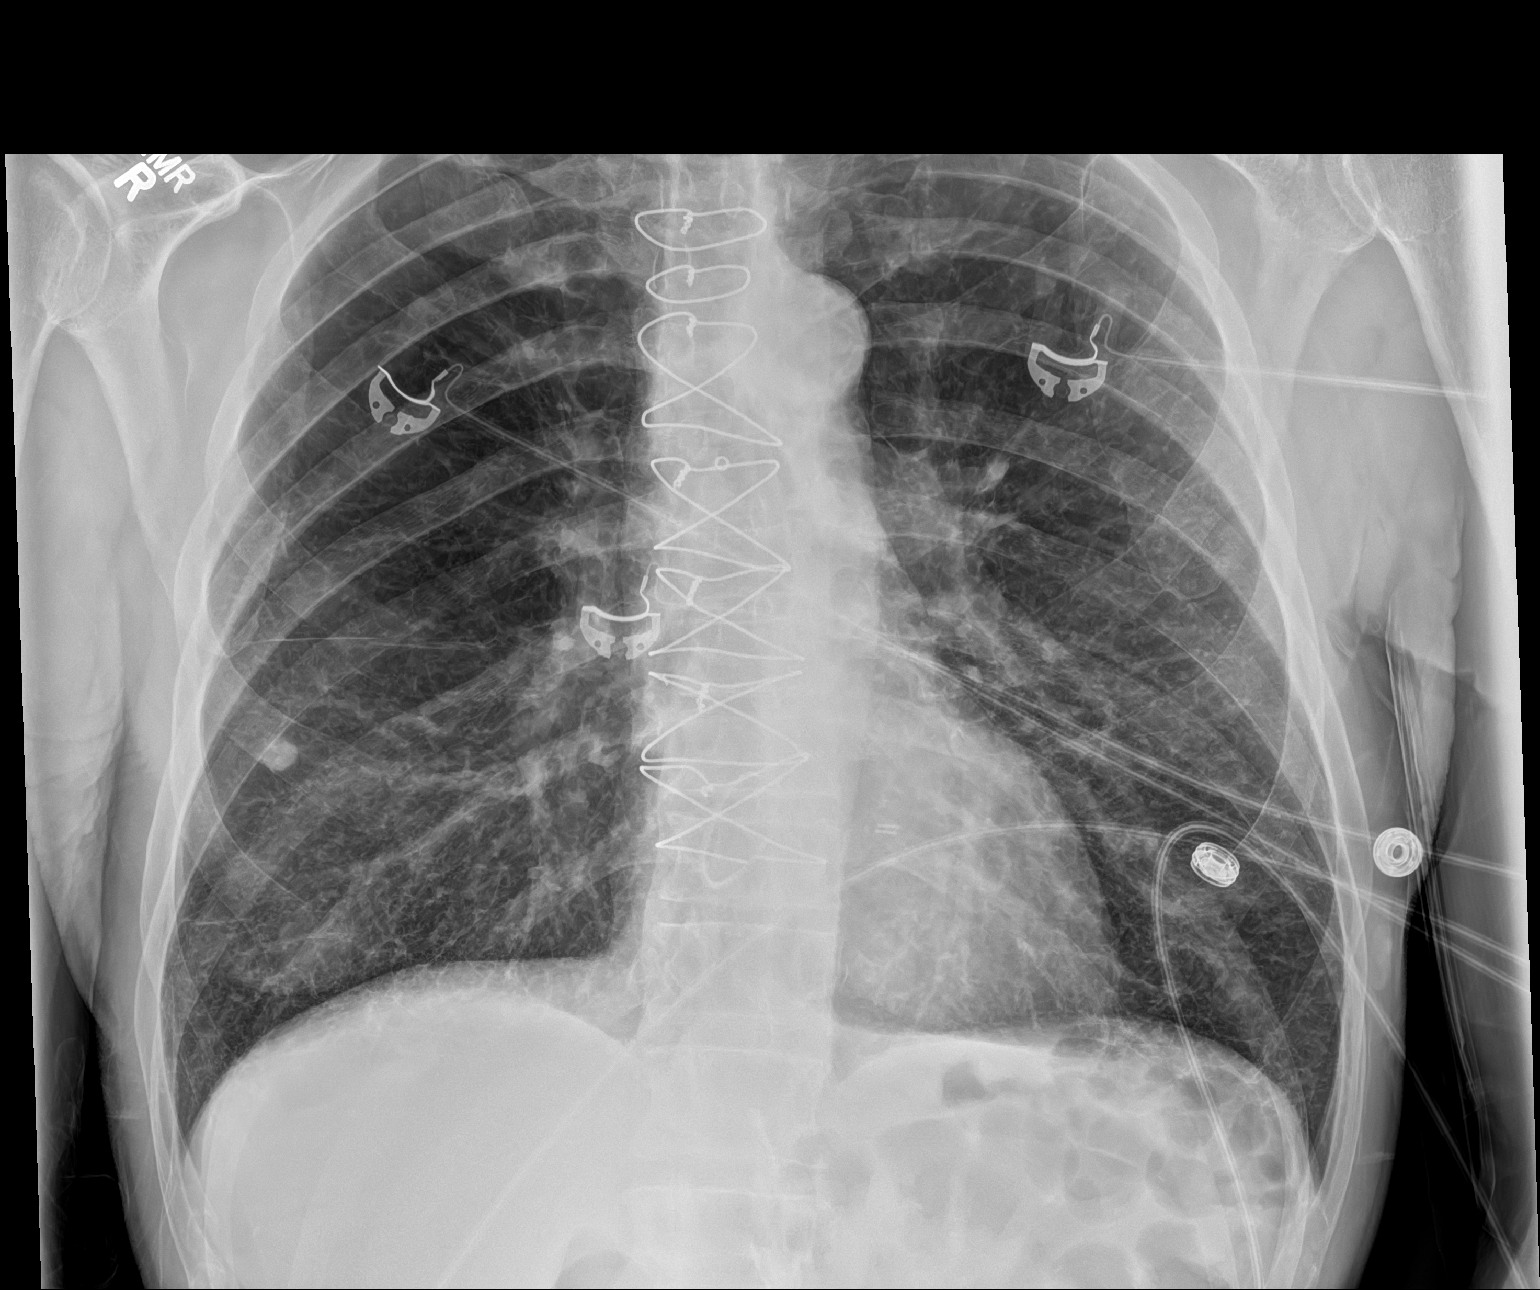

[1 of 1 positions shown; findings below may reference images not displayed]

FINDINGS: The heart size and mediastinal contours are within normal limits.
Sternotomy wires are noted. No pneumothorax or pleural effusion is
noted. Stable interstitial densities are noted in both lung bases
most consistent with scarring. Stable calcified granuloma is noted
in right lower lobe. No acute pulmonary disease is noted. The
visualized skeletal structures are unremarkable.
IMPRESSION: No active disease.

## 2020-08-29 IMAGING — MR MR HEAD W/O CM
12 series · 48 of 48 positions shown · non-contrast
Comparison: None.

CLINICAL DATA: 69-year-old male with dizziness.

EXAM:
MRI HEAD WITHOUT CONTRAST
TECHNIQUE: Multiplanar, multiecho pulse sequences of the brain and surrounding
structures were obtained without intravenous contrast.

[Series 5: DWI · axial · 3.0mm · 0.77mm/px · z∈[-74,+67]mm · 3 of 50 slices shown (1 of 4)]
[im 1/50]
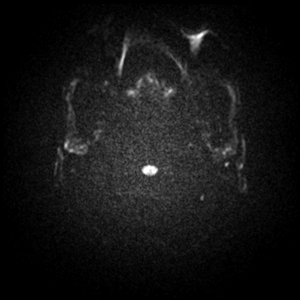
[im 25/50]
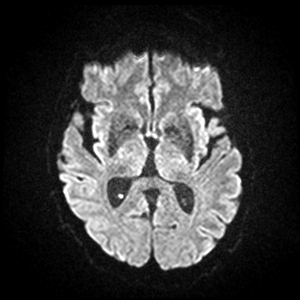
[im 50/50]
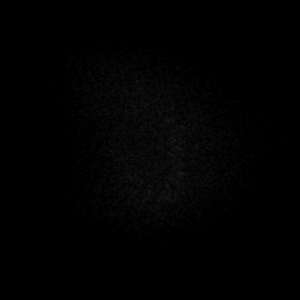

[Series 6: DWI · axial · 3.0mm · 0.77mm/px · z∈[-74,+67]mm · 4 of 50 slices shown (2 of 4)]
[im 1/50]
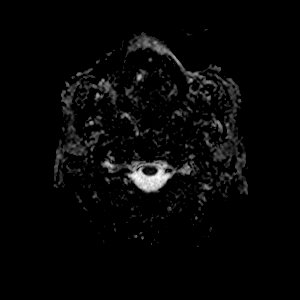
[im 17/50]
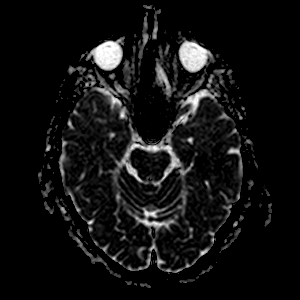
[im 33/50]
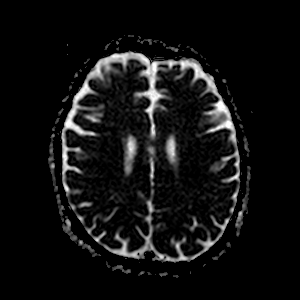
[im 50/50]
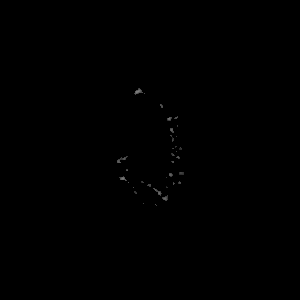

[Series 7: DWI · coronal · 5.0mm · 0.88mm/px · 2 of 28 slices shown (3 of 4)]
[im 1/28]
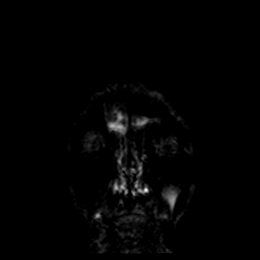
[im 28/28]
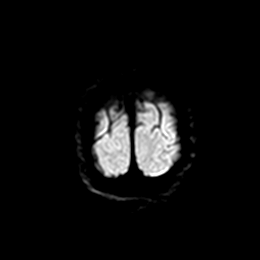

[Series 8: DWI · coronal · 5.0mm · 0.88mm/px · 2 of 28 slices shown (4 of 4)]
[im 1/28]
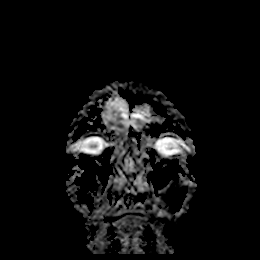
[im 28/28]
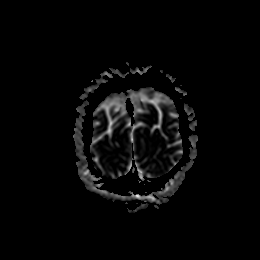

[Series 14: T1 · sagittal · 5.0mm · 0.75mm/px · 2 of 21 slices shown (1 of 2)]
[im 1/21]
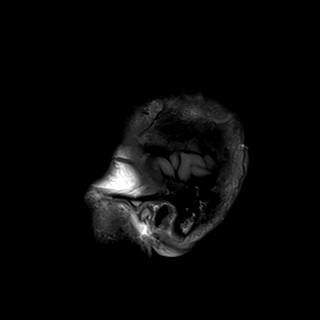
[im 21/21]
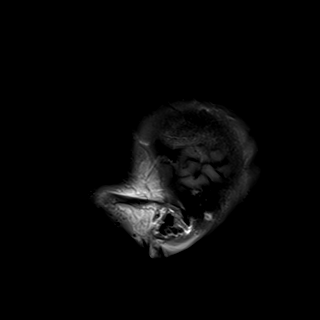

[Series 15: T2 · axial · 5.0mm · 0.72mm/px · z∈[-77,+70]mm · 2 of 23 slices shown (1 of 2)]
[im 1/23]
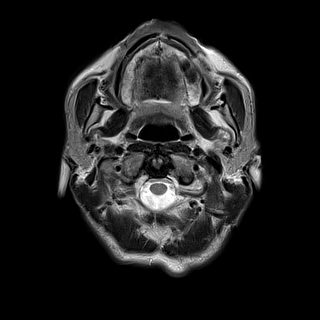
[im 23/23]
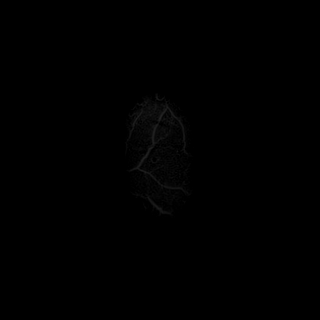

[Series 16: mag_images · axial · 3.0mm · 0.90mm/px · z∈[-87,+82]mm · 5 of 60 slices shown]
[im 1/60]
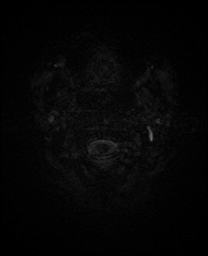
[im 15/60]
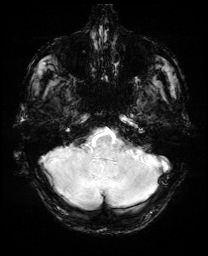
[im 30/60]
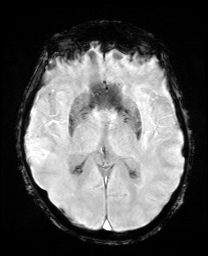
[im 45/60]
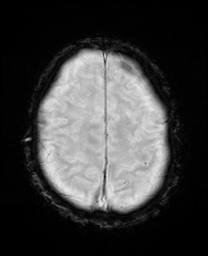
[im 60/60]
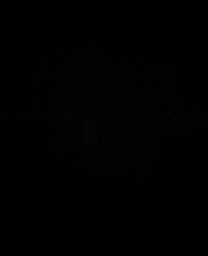

[Series 17: pha_images · axial · 3.0mm · 0.90mm/px · z∈[-87,+76]mm · 4 of 57 slices shown]
[im 1/57]
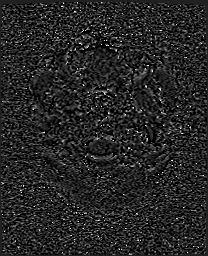
[im 19/57]
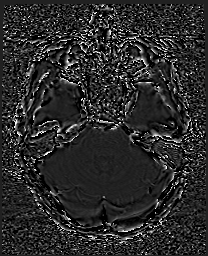
[im 38/57]
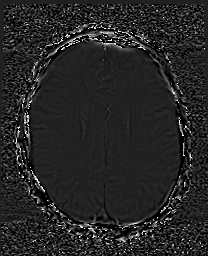
[im 57/57]
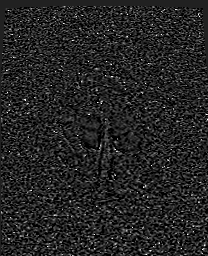

[Series 18: swi_images · axial · 3.0mm · 0.90mm/px · z∈[-87,+82]mm · 5 of 60 slices shown]
[im 1/60]
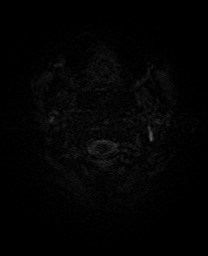
[im 15/60]
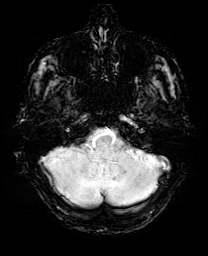
[im 30/60]
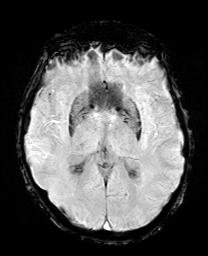
[im 45/60]
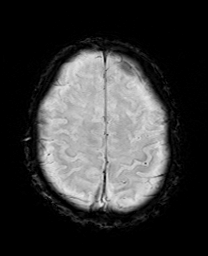
[im 60/60]
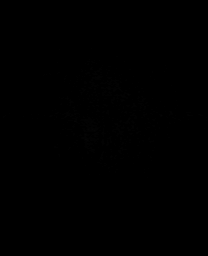

[Series 20: FLAIR · axial · 3.0mm · 0.45mm/px · z∈[-73,+67]mm · 4 of 50 slices shown]
[im 1/50]
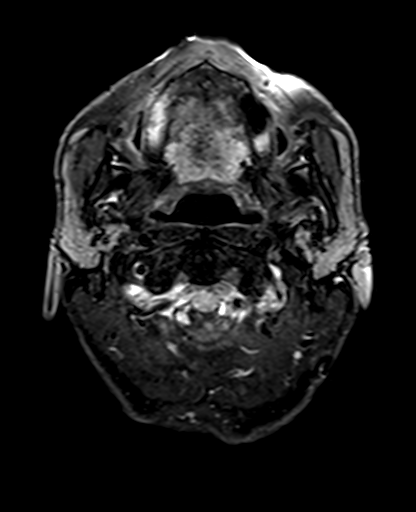
[im 17/50]
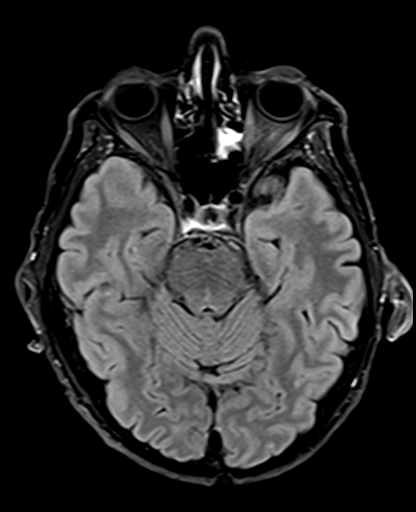
[im 33/50]
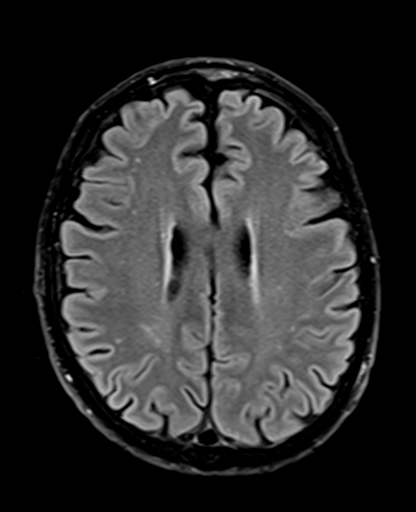
[im 50/50]
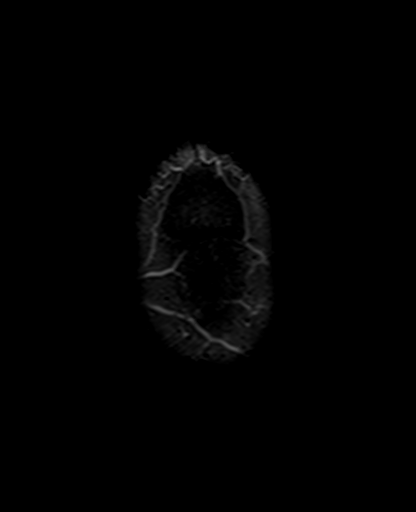

[Series 21: T1 · axial · 1.0mm · 0.98mm/px · z∈[-90,+76]mm · 13 of 171 slices shown (2 of 2)]
[im 1/171]
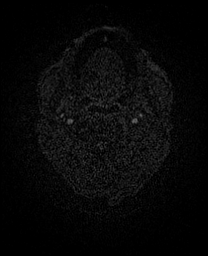
[im 15/171]
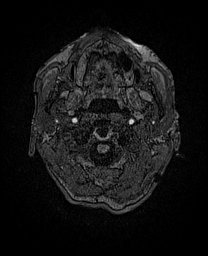
[im 29/171]
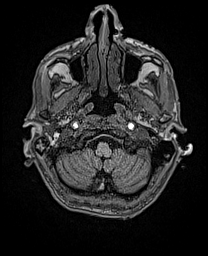
[im 43/171]
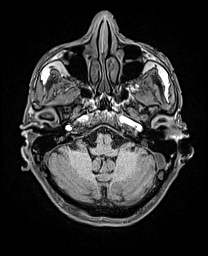
[im 57/171]
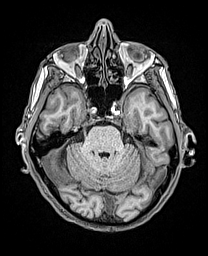
[im 71/171]
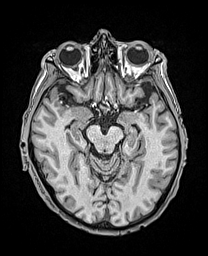
[im 86/171]
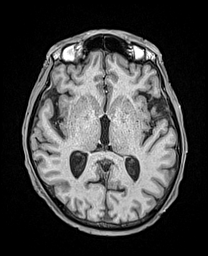
[im 100/171]
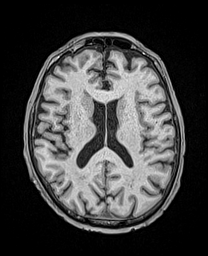
[im 114/171]
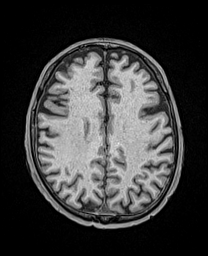
[im 128/171]
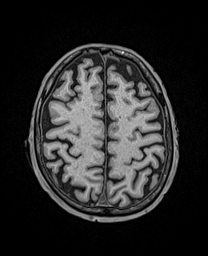
[im 142/171]
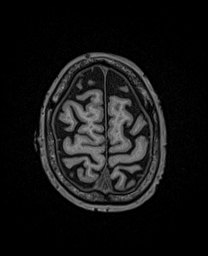
[im 156/171]
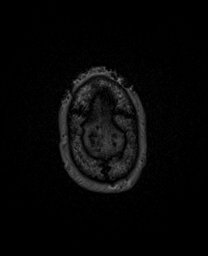
[im 171/171]
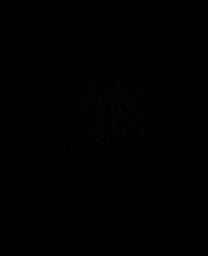

[Series 22: T2 · coronal · 5.0mm · 0.72mm/px · 2 of 28 slices shown (2 of 2)]
[im 1/28]
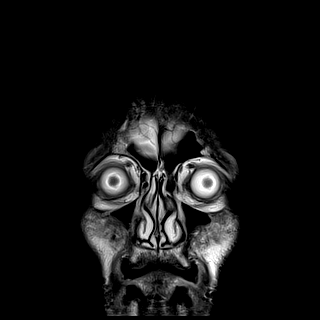
[im 28/28]
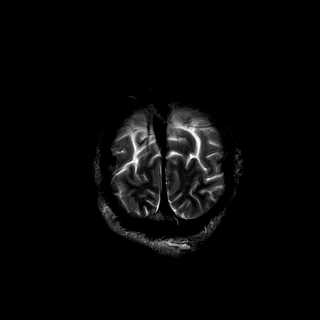

[48 of 48 positions shown; findings below may reference images not displayed]

FINDINGS: Brain: No restricted diffusion to suggest acute infarction. No
midline shift, mass effect, evidence of mass lesion,
ventriculomegaly, extra-axial collection or acute intracranial
hemorrhage. Cervicomedullary junction and pituitary are within
normal limits.

Scattered small nonspecific cerebral white matter T2 and FLAIR
hyperintense foci, mild to moderate for age. Mild involvement of the
bilateral basal ganglia. No cortical encephalomalacia. Two possible
chronic micro hemorrhages in the left inferior temporal lobe and
left deep cerebellar nuclei on SWI.

Otherwise gray and white matter signal is within normal limits for
age.

Vascular: Major intracranial vascular flow voids are preserved.

Skull and upper cervical spine: Negative for age visible cervical
spine, bone marrow signal.

Sinuses/Orbits: Negative orbits. Trace paranasal sinus mucosal
thickening. No sinus fluid levels.

Other: Mastoids are clear. Grossly normal visible internal auditory
structures with normal stylomastoid foramina. Evidence of scalp
vertex skin staples. Negative visible face soft tissues.
IMPRESSION: 1. No acute intracranial abnormality.
2. Mild to moderate for age signal changes in the white matter and
basal ganglia most commonly due to chronic small vessel disease.

## 2020-08-29 MED ORDER — ALBUTEROL SULFATE HFA 108 (90 BASE) MCG/ACT IN AERS: 1.0000 | INHALATION_SPRAY | Freq: Four times a day (QID) | RESPIRATORY_TRACT | 0 refills | Status: AC | PRN

## 2020-08-29 MED ORDER — PREDNISONE 10 MG PO TABS: 40.0000 mg | ORAL_TABLET | Freq: Every day | ORAL | 0 refills | Status: AC

## 2020-08-29 MED ORDER — MECLIZINE HCL 12.5 MG PO TABS
25.0000 mg | ORAL_TABLET | Freq: Once | ORAL | Status: AC
  Administered 2020-08-29: 25 mg via ORAL
  Filled 2020-08-29: qty 2

## 2020-08-29 NOTE — Discharge Instructions (Addendum)
Take prednisone and use inhaler as prescribed.  This is due to your complaint of cough and wheezing.  Follow-up with your doctor this week for recheck, return to ER for worsening or concerning symptoms.

## 2020-08-29 NOTE — ED Notes (Signed)
Patient transported to MRI 

## 2020-08-29 NOTE — ED Provider Notes (Signed)
[facility]GENCY DEPARTMENT Provider Note   CSN: [id number] Arrival date & time: 08/29/20  1351     History Chief Complaint  Patient presents with  . Chest Pain    [name] is a 69 y.o. male.  69 year old male with history of AAA, COPD, MI with history of CABG, presents with complaint of not feeling well. Patient states he first noticed he did not feel well on Sunday (2 days ago), felt nauseous, worsening nausea the following day and developed dizziness (described as room spinning) which is worse with standing/improves with sitting and causes difficulty walking. Also reports cough which is productive (coughing up a black substance that he states is nicotine due to smoking 3 PPD), also wheezing which is not normal for him. Denies feeling short of breath. Reports occasional chest discomfort that is similar to pain is experiences about once/week, not worse with exertion. Patient is vaccinated against COVID but is concerned he may have COVID. Denies unilateral weakness or numbness, changes in vision or speech. No fevers, chills, sick contacts. No other complaints or concerns.         Past Medical History:  Diagnosis Date  . AAA (abdominal aortic aneurysm) (HCC)   . Anxiety   . Arthritis   . COPD (chronic obstructive pulmonary disease) (HCC)   . 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 MI (myocardial infarction) (HCC)   . Pulmonary nodule    benign    Patient Active Problem List   Diagnosis Date Noted  . Pulmonary nodule 08/14/2018  . Unstable angina (HCC)   . Gastroesophageal reflux disease   . Nausea without vomiting   . Elevated MCV 05/24/2017  . COPD (chronic obstructive pulmonary disease) (HCC) 05/23/2017  . Anxiety 05/23/2017  . Angina, class II (HCC) 11/11/2016  . Abnormal nuclear stress test 11/11/2016  . Chest pain,  non-cardiac 10/09/2016  . Coronary artery disease involving native heart with angina pectoris (HCC) 10/09/2016  . Tobacco abuse 10/09/2016  . Pure hypercholesterolemia 03/21/2015  . 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 AAA (abdominal aortic aneurysm) without rupture (HCC) 03/01/2014    Past Surgical History:  Procedure Laterality Date  . BIOPSY  09/08/2018   Procedure: BIOPSY;  Surgeon: West BaliFields, Sandi L, MD;  Location: [facility];  Service: Endoscopy;;  gastric  . CARDIAC CATHETERIZATION  01/2015   Coronary angiography on 02/17/15 demonstrated the left main coronary artery to have mild 30-40% stenosis, 100% LAD stenosis with right to left collaterals and faint left to left collaterals, small nondominant left circumflex, small to medium caliber first obtuse marginal branch with 20% ostial and proximal stenosis. The RCA was noted to have diffuse disease.  Marland Kitchen. CARDIAC CATHETERIZATION N/A 11/11/2016   Procedure: Left Heart Cath and Cors/Grafts Angiography;  Surgeon: Marykay Lexavid W Harding, MD;  [facility];  Service: Cardiovascular;  Laterality: N/A;  . CORONARY ARTERY BYPASS GRAFT  01/2015  . ESOPHAGOGASTRODUODENOSCOPY (EGD) WITH PROPOFOL N/A 09/08/2018   Procedure: ESOPHAGOGASTRODUODENOSCOPY (EGD) WITH PROPOFOL;  Surgeon: West BaliFields, Sandi L, MD;  Location: [facility];  Service: Endoscopy;  Laterality: N/A;  2:45pm  . INGUINAL HERNIA REPAIR Bilateral   . VALVE REPLACEMENT  01/2015   Tricuspid Valve Replacement       Family History  Problem Relation Age of Onset  . Heart disease Father   .  Heart attack Sister        Died in her 66s  . CAD Brother   . Heart attack Brother   . CAD Brother   . Heart attack Brother     Social History   Tobacco Use  . Smoking status: Current Every Day Smoker    Packs/day: 2.00    Years: 60.00    Pack years: 120.00    Types: Cigarettes    Last attempt to quit: 02/11/2015    Years since quitting: 5.5  . Smokeless tobacco:  Never Used  Vaping Use  . Vaping Use: Never used  Substance Use Topics  . Alcohol use: Not Currently    Alcohol/week: 1.0 standard drink    Types: 1 Cans of beer per week    Comment: one beer a week  . Drug use: No    Home Medications Prior to Admission medications   Medication Sig Start Date End Date Taking? Authorizing Provider  ALPRAZolam Prudy Feeler) 1 MG tablet Take 1 mg by mouth 4 (four) times daily as needed for anxiety.  09/17/16  Yes [provider]  aspirin EC 81 MG tablet Take 81 mg by mouth daily.    Yes [provider]  carbamazepine (TEGRETOL XR) 200 MG 12 hr tablet Take 400 mg by mouth 2 (two) times daily. Start with 1 tablet twice a week, then increase to 2 tablets twice daily 08/24/20  Yes [provider]  HYDROcodone-acetaminophen (NORCO) 7.5-325 MG tablet Take 1 tablet by mouth every 6 (six) hours as needed for moderate pain.  08/16/16  Yes [provider]  nitroGLYCERIN (NITROSTAT) 0.4 MG SL tablet PLACE 1 TABLET UNDER THE TONGUE EVERY 5 MINUTES AS NEEDED FOR CHEST PAIN Patient taking differently: Place 0.4 mg under the tongue every 5 (five) minutes as needed.  02/11/18  Yes Jodelle Gross, NP  omeprazole (PRILOSEC) 40 MG capsule Take 40 mg by mouth daily.  08/13/20  Yes [provider]  pantoprazole (PROTONIX) 40 MG tablet Take 1 tablet (40 mg total) by mouth 2 (two) times daily before a meal. 08/14/18 08/29/20 Yes Johnson, Clanford L, MD  Suvorexant 10 MG TABS Take by mouth.   Yes [provider]  zolpidem (AMBIEN) 10 MG tablet Take 10 mg by mouth at bedtime as needed for sleep.  07/27/18  Yes [provider]  albuterol (VENTOLIN HFA) 108 (90 Base) MCG/ACT inhaler Inhale 1-2 puffs into the lungs every 6 (six) hours as needed for wheezing or shortness of breath. 08/29/20   Jeannie Fend, PA-C  predniSONE (DELTASONE) 10 MG tablet Take 4 tablets (40 mg total) by mouth daily for 5 days. 08/29/20 09/03/20  Jeannie Fend, PA-C    Allergies    Patient has no known allergies.  Review of Systems   Review of Systems  Constitutional: Negative for chills, diaphoresis and fever.  HENT: Positive for congestion.   Eyes: Negative for visual disturbance.  Respiratory: Positive for cough and wheezing. Negative for shortness of breath.   Cardiovascular: Positive for chest pain. Negative for leg swelling.  Gastrointestinal: Negative for abdominal pain, constipation, diarrhea, nausea and vomiting.  Genitourinary: Negative for dysuria and frequency.  Musculoskeletal: Negative for arthralgias and myalgias.  Skin: Negative for rash and wound.  Allergic/Immunologic: Negative for immunocompromised state.  Neurological: Positive for dizziness. Negative for speech difficulty, weakness and numbness.  All other systems reviewed and are negative.   Physical Exam Updated Vital Signs BP (!) 161/90   Pulse 78  Temp 98.3 F (36.8 C) (Oral)   Resp 15   Ht 5\' 6"  (1.676 m)   Wt 59 kg   SpO2 99%   BMI 20.98 kg/m   Physical Exam Vitals and nursing note reviewed.  Constitutional:      General: He is not in acute distress.    Appearance: He is well-developed. He is not diaphoretic.  HENT:     Head: Normocephalic and atraumatic.     Mouth/Throat:     Mouth: Mucous membranes are moist.  Eyes:     Extraocular Movements: Extraocular movements intact.     Pupils: Pupils are equal, round, and reactive to light.  Cardiovascular:     Rate and Rhythm: Normal rate and regular rhythm.     Heart sounds: Normal heart sounds.  Pulmonary:     Effort: Pulmonary effort is normal.     Breath sounds: Normal breath sounds. No wheezing.  Chest:     Chest wall: No tenderness.  Abdominal:     Palpations: Abdomen is soft.     Tenderness: There is no abdominal tenderness.  Musculoskeletal:     Right lower leg: No edema.     Left lower leg: No edema.  Skin:    General: Skin is warm and dry.     Findings: No erythema or  rash.  Neurological:     Mental Status: He is alert and oriented to person, place, and time.     Cranial Nerves: No cranial nerve deficit.     Sensory: No sensory deficit.     Motor: No weakness.  Psychiatric:        Behavior: Behavior normal.     ED Results / Procedures / Treatments   Labs (all labs ordered are listed, but only abnormal results are displayed) Labs Reviewed  BASIC METABOLIC PANEL - Abnormal; Notable for the following components:      Result Value   Glucose, Bld 148 (*)    All other components within normal limits  CBC - Abnormal; Notable for the following components:   RBC 3.78 (*)    MCV 104.2 (*)    MCH 34.9 (*)    All other components within normal limits  RESPIRATORY PANEL BY RT PCR (FLU A&B, COVID)  TROPONIN I (HIGH SENSITIVITY)  TROPONIN I (HIGH SENSITIVITY)    EKG EKG Interpretation  Date/Time:  Tuesday August 29 2020 13:52:57 EST Ventricular Rate:  86 PR Interval:  150 QRS Duration: 86 QT Interval:  360 QTC Calculation: 430 R Axis:   102 Text Interpretation: Normal sinus rhythm Rightward axis Anteroseptal infarct , age undetermined Abnormal ECG Confirmed by 04[birth year]23[birth year]1980 [phone]) on 08/29/2020 2:13:23 PM   Radiology MR BRAIN WO CONTRAST  Result Date: 08/29/2020 CLINICAL DATA:  69 year old male with dizziness. EXAM: MRI HEAD WITHOUT CONTRAST TECHNIQUE: Multiplanar, multiecho pulse sequences of the brain and surrounding structures were obtained without intravenous contrast. COMPARISON:  None. FINDINGS: Brain: No restricted diffusion to suggest acute infarction. No midline shift, mass effect, evidence of mass lesion, ventriculomegaly, extra-axial collection or acute intracranial hemorrhage. Cervicomedullary junction and pituitary are within normal limits. Scattered small nonspecific cerebral white matter T2 and FLAIR hyperintense foci, mild to moderate for age. Mild involvement of the bilateral basal ganglia. No cortical encephalomalacia. Two  possible chronic micro hemorrhages in the left inferior temporal lobe and left deep cerebellar nuclei on SWI. Otherwise gray and white matter signal is within normal limits for age. Vascular: Major intracranial vascular flow voids are preserved. Skull and  upper cervical spine: Negative for age visible cervical spine, bone marrow signal. Sinuses/Orbits: Negative orbits. Trace paranasal sinus mucosal thickening. No sinus fluid levels. Other: Mastoids are clear. Grossly normal visible internal auditory structures with normal stylomastoid foramina. Evidence of scalp vertex skin staples. Negative visible face soft tissues. IMPRESSION: 1. No acute intracranial abnormality. 2. Mild to moderate for age signal changes in the white matter and basal ganglia most commonly due to chronic small vessel disease. Electronically Signed   By: Odessa Fleming M.D.   On: 08/29/2020 15:53   DG Chest Portable 1 View  Result Date: 08/29/2020 CLINICAL DATA:  Chest pain. EXAM: PORTABLE CHEST 1 VIEW COMPARISON:  August 11, 2020. FINDINGS: The heart size and mediastinal contours are within normal limits. Sternotomy wires are noted. No pneumothorax or pleural effusion is noted. Stable interstitial densities are noted in both lung bases most consistent with scarring. Stable calcified granuloma is noted in right lower lobe. No acute pulmonary disease is noted. The visualized skeletal structures are unremarkable. IMPRESSION: No active disease. Electronically Signed   By: Lupita Raider M.D.   On: 08/29/2020 14:20    Procedures Procedures (including critical care time)  Medications Ordered in ED Medications  meclizine (ANTIVERT) tablet 25 mg (25 mg Oral Given 08/29/20 1533)    ED Course  I have reviewed the triage vital signs and the nursing notes.  Pertinent labs & imaging results that were available during my care of the patient were reviewed by me and considered in my medical decision making (see chart for details).  Clinical Course  as of Aug 29 1745  Tue Aug 29, 2020  6328 69 year old male with multiple complaints as above. On exam, appears well and not in acute distress. Dizziness reproduced with lying patient supine and sitting up, no nystagmus. Neuro exam unremarkable. Lungs clear. Plan is for MRI to evaluate for CVA vs vertigo, dizziness which is positional however with history of CAD and heavy tobacco use, at high risk for CVA. EKG without ischemic changes, initial trop 5, will follow delta.  CXR without acute findings.  CBC and CMP without significant findings. COVID test pending.    [LM]  1559 MRI with chronic changes, negative for acute CVA. COVID and repeat troponin pending.    [LM]  1603 Troponin unchanged. Patient reports no relief with meclizine. Recommend recheck with PCP. Given Prednisone and inhaler for cough/wheezing.    [LM]    Clinical Course User Index [LM] Alden Hipp   MDM Rules/Calculators/A&P                          Final Clinical Impression(s) / ED Diagnoses Final diagnoses:  Vertigo  Atypical chest pain  Viral URI with cough    Rx / DC Orders ED Discharge Orders         Ordered    albuterol (VENTOLIN HFA) 108 (90 Base) MCG/ACT inhaler  Every 6 hours PRN        08/29/20 1742    predniSONE (DELTASONE) 10 MG tablet  Daily        08/29/20 1742           Jeannie Fend, PA-C 08/29/20 1746    Sabas Sous, MD 08/30/20 1338

## 2020-08-29 NOTE — ED Notes (Signed)
Patient transported from MRI. Returned to room in ED

## 2020-08-29 NOTE — ED Triage Notes (Signed)
Pt with CP since yesterday.   Mid cp that radiates down left arm.  C/o dizziness.  Denies SOB.

## 2020-09-04 ENCOUNTER — Other Ambulatory Visit (HOSPITAL_COMMUNITY): Payer: Self-pay | Admitting: Family Medicine

## 2020-09-04 ENCOUNTER — Other Ambulatory Visit: Payer: Self-pay | Admitting: Family Medicine

## 2020-09-04 DIAGNOSIS — I714 Abdominal aortic aneurysm, without rupture, unspecified: Secondary | ICD-10-CM

## 2020-09-04 DIAGNOSIS — R110 Nausea: Secondary | ICD-10-CM

## 2020-09-10 ENCOUNTER — Emergency Department (HOSPITAL_COMMUNITY)
Admission: EM | Admit: 2020-09-10 | Discharge: 2020-09-10 | Disposition: A | Discharge status: Home / Self Care | Payer: Medicare Other | Attending: Emergency Medicine | Admitting: Emergency Medicine

## 2020-09-10 ENCOUNTER — Other Ambulatory Visit: Payer: Self-pay

## 2020-09-10 ENCOUNTER — Encounter (HOSPITAL_COMMUNITY): Payer: Self-pay

## 2020-09-10 ENCOUNTER — Emergency Department (HOSPITAL_COMMUNITY): Payer: Medicare Other

## 2020-09-10 DIAGNOSIS — K219 Gastro-esophageal reflux disease without esophagitis: Secondary | ICD-10-CM | POA: Diagnosis not present

## 2020-09-10 DIAGNOSIS — J449 Chronic obstructive pulmonary disease, unspecified: Secondary | ICD-10-CM | POA: Insufficient documentation

## 2020-09-10 DIAGNOSIS — Z7982 Long term (current) use of aspirin: Secondary | ICD-10-CM | POA: Insufficient documentation

## 2020-09-10 DIAGNOSIS — R110 Nausea: Secondary | ICD-10-CM | POA: Diagnosis not present

## 2020-09-10 DIAGNOSIS — R109 Unspecified abdominal pain: Secondary | ICD-10-CM | POA: Diagnosis present

## 2020-09-10 DIAGNOSIS — Z951 Presence of aortocoronary bypass graft: Secondary | ICD-10-CM | POA: Diagnosis not present

## 2020-09-10 DIAGNOSIS — I25119 Atherosclerotic heart disease of native coronary artery with unspecified angina pectoris: Secondary | ICD-10-CM | POA: Diagnosis not present

## 2020-09-10 DIAGNOSIS — R5381 Other malaise: Secondary | ICD-10-CM

## 2020-09-10 DIAGNOSIS — F17210 Nicotine dependence, cigarettes, uncomplicated: Secondary | ICD-10-CM | POA: Insufficient documentation

## 2020-09-10 LAB — URINALYSIS, ROUTINE W REFLEX MICROSCOPIC
Bilirubin Urine: NEGATIVE
Glucose, UA: NEGATIVE mg/dL
Hgb urine dipstick: NEGATIVE
Ketones, ur: NEGATIVE mg/dL
Leukocytes,Ua: NEGATIVE
Nitrite: NEGATIVE
Protein, ur: NEGATIVE mg/dL
Specific Gravity, Urine: 1.012 (ref 1.005–1.030)
pH: 7 (ref 5.0–8.0)

## 2020-09-10 LAB — CBC WITH DIFFERENTIAL/PLATELET
Abs Immature Granulocytes: 0.03 10*3/uL (ref 0.00–0.07)
Basophils Absolute: 0 10*3/uL (ref 0.0–0.1)
Basophils Relative: 0 %
Eosinophils Absolute: 0.1 10*3/uL (ref 0.0–0.5)
Eosinophils Relative: 1 %
HCT: 42.8 % (ref 39.0–52.0)
Hemoglobin: 14.4 g/dL (ref 13.0–17.0)
Immature Granulocytes: 0 %
Lymphocytes Relative: 25 %
Lymphs Abs: 2.4 10*3/uL (ref 0.7–4.0)
MCH: 35.4 pg — ABNORMAL HIGH (ref 26.0–34.0)
MCHC: 33.6 g/dL (ref 30.0–36.0)
MCV: 105.2 fL — ABNORMAL HIGH (ref 80.0–100.0)
Monocytes Absolute: 0.7 10*3/uL (ref 0.1–1.0)
Monocytes Relative: 8 %
Neutro Abs: 6.2 10*3/uL (ref 1.7–7.7)
Neutrophils Relative %: 66 %
Platelets: 193 10*3/uL (ref 150–400)
RBC: 4.07 MIL/uL — ABNORMAL LOW (ref 4.22–5.81)
RDW: 12.9 % (ref 11.5–15.5)
WBC: 9.3 10*3/uL (ref 4.0–10.5)
nRBC: 0 % (ref 0.0–0.2)

## 2020-09-10 LAB — COMPREHENSIVE METABOLIC PANEL
ALT: 12 U/L (ref 0–44)
AST: 18 U/L (ref 15–41)
Albumin: 4 g/dL (ref 3.5–5.0)
Alkaline Phosphatase: 54 U/L (ref 38–126)
Anion gap: 8 (ref 5–15)
BUN: 12 mg/dL (ref 8–23)
CO2: 26 mmol/L (ref 22–32)
Calcium: 9.5 mg/dL (ref 8.9–10.3)
Chloride: 101 mmol/L (ref 98–111)
Creatinine, Ser: 0.85 mg/dL (ref 0.61–1.24)
GFR, Estimated: >60 mL/min (ref >60)
Glucose, Bld: 88 mg/dL (ref 70–99)
Potassium: 3.9 mmol/L (ref 3.5–5.1)
Sodium: 135 mmol/L (ref 135–145)
Total Bilirubin: 0.6 mg/dL (ref 0.3–1.2)
Total Protein: 7.1 g/dL (ref 6.5–8.1)

## 2020-09-10 LAB — LIPASE, BLOOD: Lipase: 23 U/L (ref 11–51)

## 2020-09-10 IMAGING — CT CT ABD-PELV W/ CM
2 of 5 series · 16 of 46 positions shown, 18 images · IV contrast (Omnipaque or Isovue)
Comparison: [DATE]

CLINICAL DATA: Upper abdominal pain, nausea, vomiting

EXAM:
CT ABDOMEN AND PELVIS WITH CONTRAST
TECHNIQUE: Multidetector CT imaging of the abdomen and pelvis was performed
using the standard protocol following bolus administration of
intravenous contrast.
CONTRAST:  100mL OMNIPAQUE IOHEXOL 300 MG/ML  SOLN

[Series 2: axial st · axial · 0.68mm/px · z∈[+760,+1130]mm · 13 of 84 slices shown, 15 images]
[im 5/84  soft-tissue]
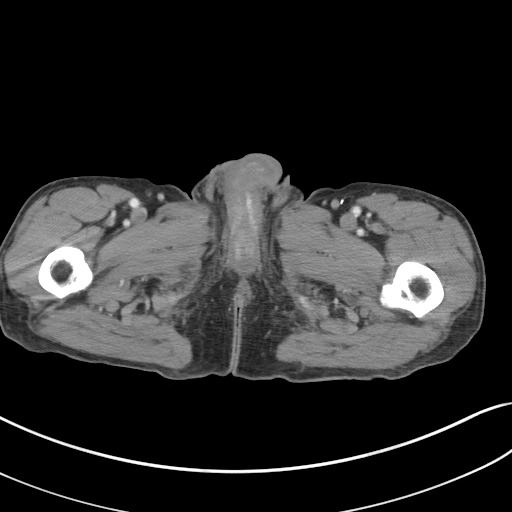
[im 5/84  bone]
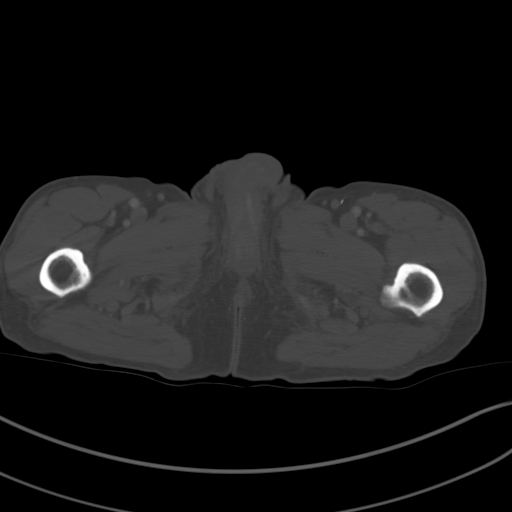
[im 14/84  soft-tissue]
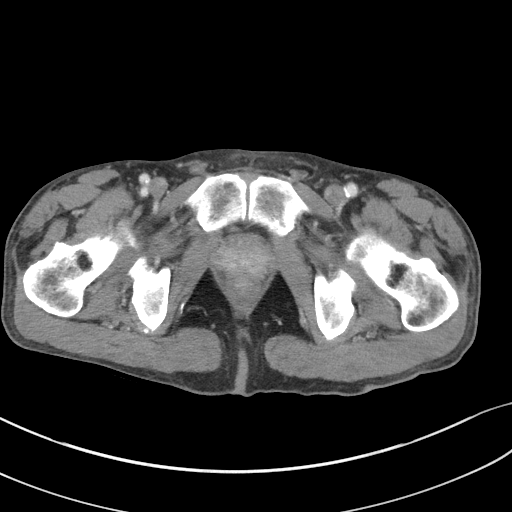
[im 18/84  soft-tissue]
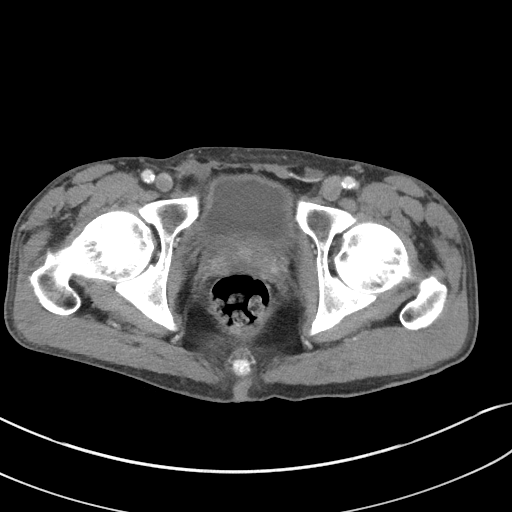
[im 22/84  soft-tissue]
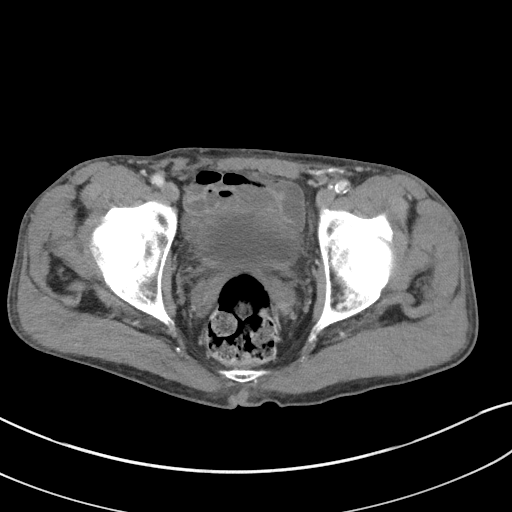
[im 31/84  soft-tissue]
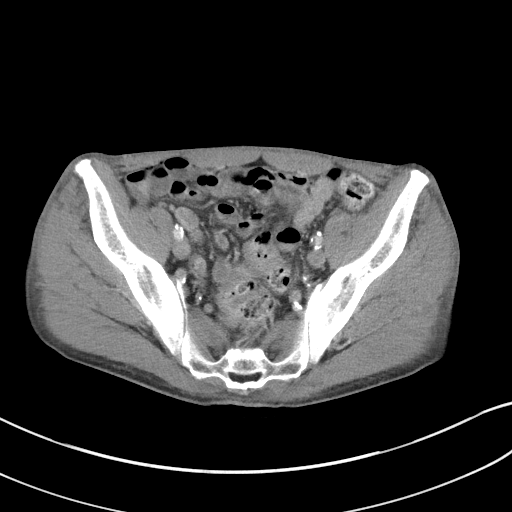
[im 35/84  soft-tissue]
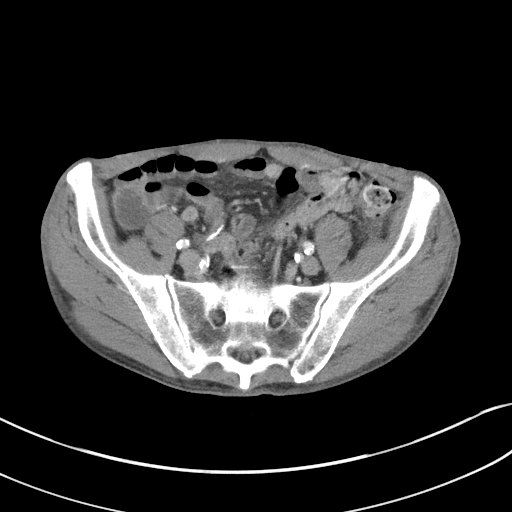
[im 44/84  soft-tissue]
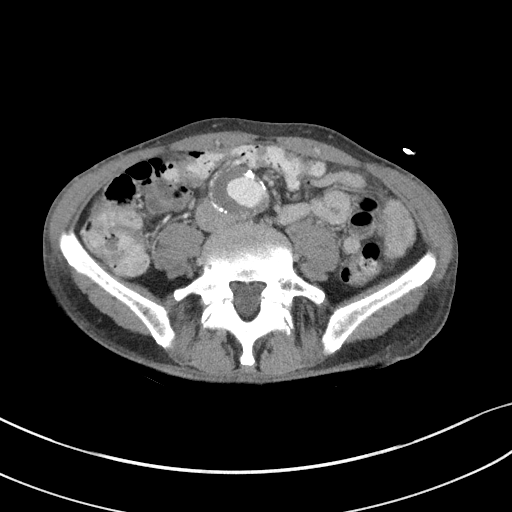
[im 49/84  soft-tissue]
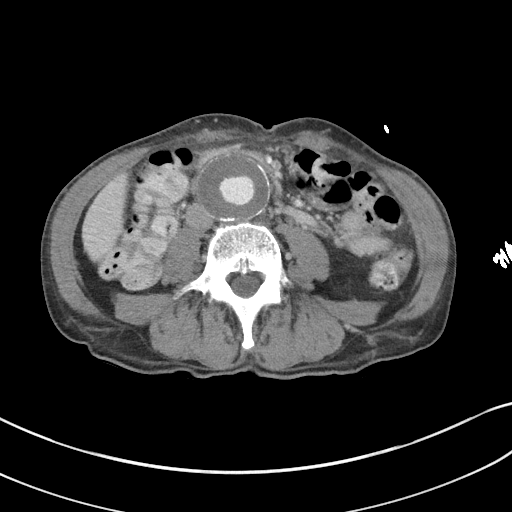
[im 53/84  soft-tissue]
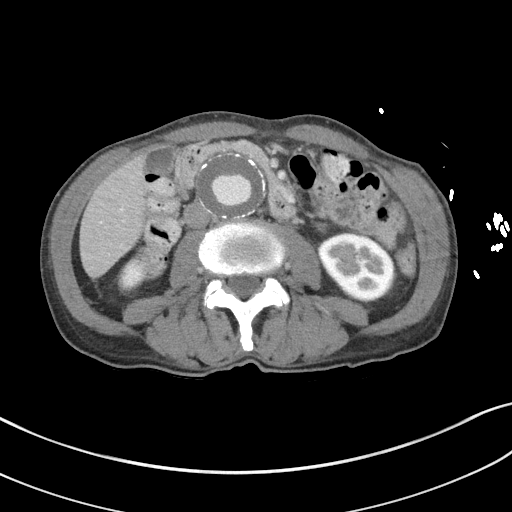
[im 53/84  bone]
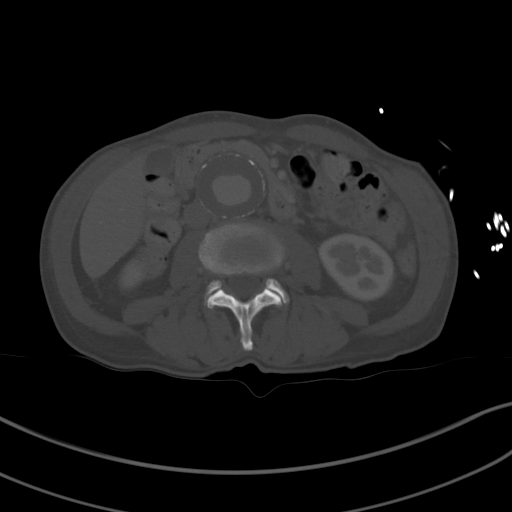
[im 62/84  soft-tissue]
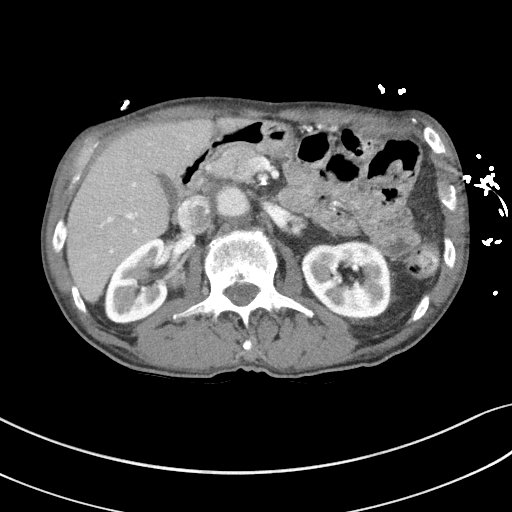
[im 66/84  soft-tissue]
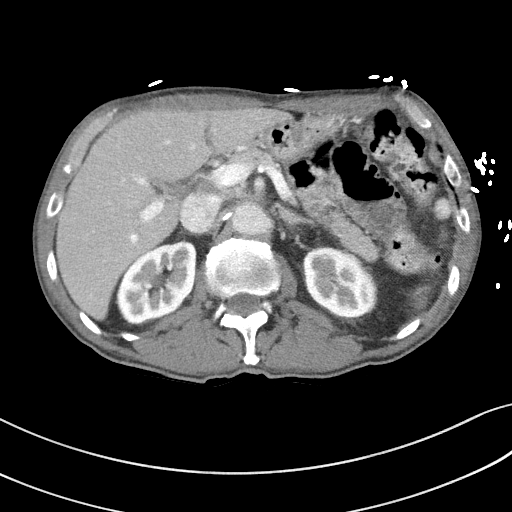
[im 70/84  soft-tissue]
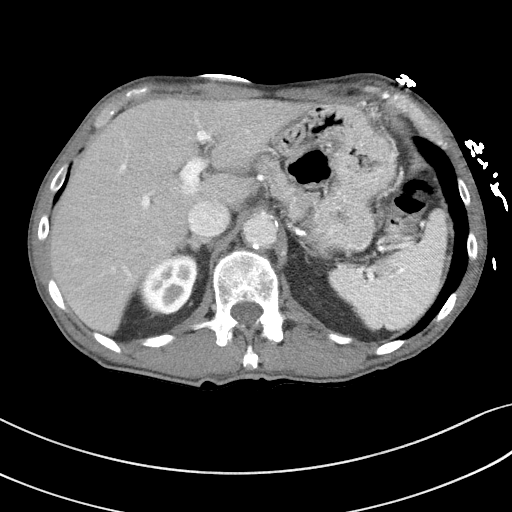
[im 79/84  soft-tissue]
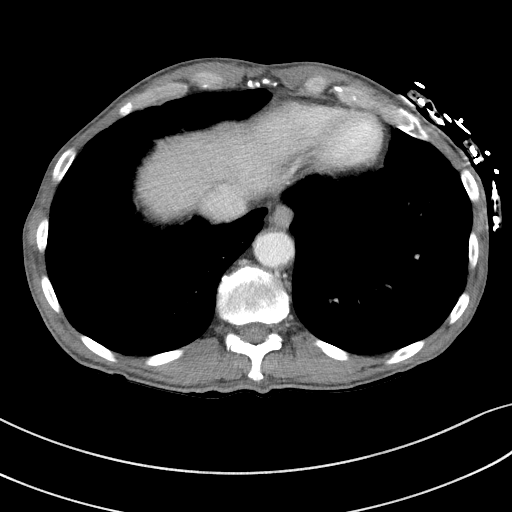

[Series 5: coronal st · coronal · 0.71mm/px · 3 of 85 slices shown]
[im 29/85  soft-tissue]
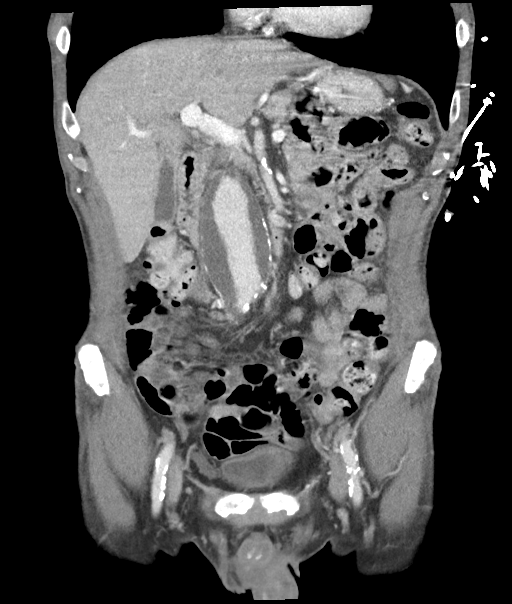
[im 38/85  soft-tissue]
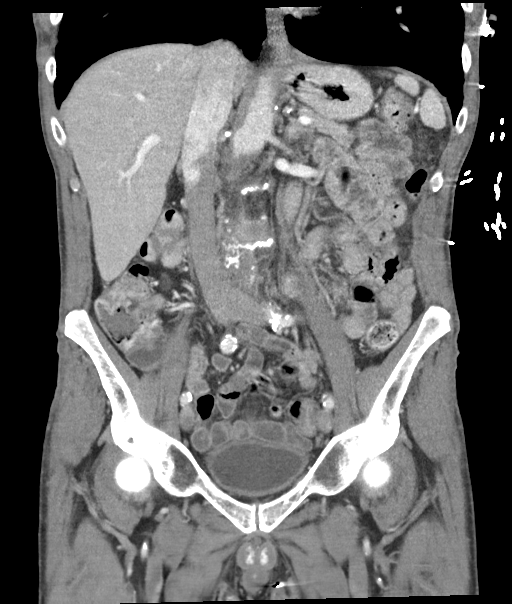
[im 47/85  soft-tissue]
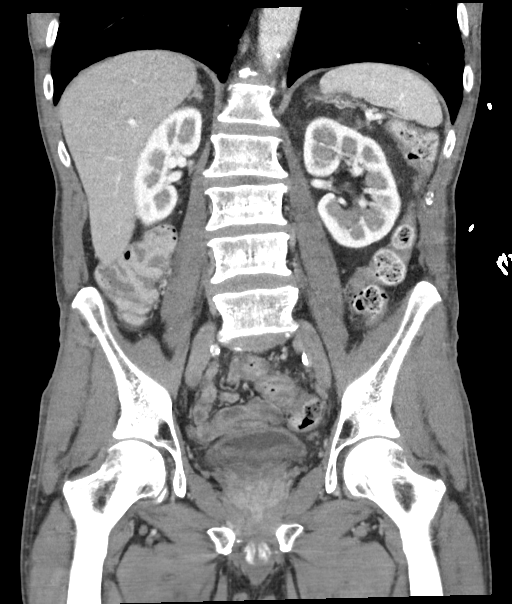

[16 of 46 positions shown; findings below may reference images not displayed]

FINDINGS: Lower chest: No acute abnormality.  Emphysema.

Hepatobiliary: No solid liver abnormality is seen. No gallstones,
gallbladder wall thickening, or biliary dilatation.

Pancreas: Unremarkable. No pancreatic ductal dilatation or
surrounding inflammatory changes.

Spleen: Normal in size without significant abnormality.

Adrenals/Urinary Tract: Adrenal glands are unremarkable. Kidneys are
normal, without renal calculi, solid lesion, or hydronephrosis.
Bladder is unremarkable.

Stomach/Bowel: Stomach is within normal limits. Appendix appears
normal. No evidence of bowel wall thickening, distention, or
inflammatory changes.

Vascular/Lymphatic: Aortic atherosclerosis. There is a large
saccular aneurysm of the infrarenal abdominal aorta, measuring 5.0 x
4.5 cm, slightly enlarged compared to prior examination dated
[DATE], at which time it measured 4.7 x 4.3 cm when measured
similarly (series 2, image 34). There is a large burden of mural
thrombus and additional aneurysmal dilatation of the bilateral
common iliac artery origins, measuring up to 2.0 cm on the right and
1.8 cm on the left. No enlarged abdominal or pelvic lymph nodes.

Reproductive: No mass or other significant abnormality.

Other: Status post left inguinal hernia repair. No abdominopelvic
ascites.

Musculoskeletal: No acute or significant osseous findings. Chronic
bilateral pars defects of L5.
IMPRESSION: 1. No acute CT findings of the abdomen or pelvis to explain upper
abdominal pain.
2. There is a large saccular aneurysm of the infrarenal abdominal
aorta, measuring 5.0 x 4.5 cm, slightly enlarged compared to prior
examination dated [DATE], at which time it measured 4.7 x 4.3 cm
when measured similarly. There is a large burden of mural thrombus
and additional aneurysmal dilatation of the bilateral common iliac
artery origins, measuring up to 2.0 cm on the right and 1.8 cm on
the left. Recommend follow-up CT/MR every 6 months and vascular
consultation. This recommendation follows ACR consensus guidelines:
White Paper of the ACR Incidental Findings Committee II on Vascular
Findings. [HOSPITAL] [L5]; [DATE].

Aortic Atherosclerosis ([L5]-[L5]) and Emphysema ([L5]-[L5]).

## 2020-09-10 MED ORDER — IOHEXOL 300 MG/ML  SOLN
100.0000 mL | Freq: Once | INTRAMUSCULAR | Status: AC | PRN
  Administered 2020-09-10: 100 mL via INTRAVENOUS

## 2020-09-10 MED ORDER — SODIUM CHLORIDE 0.9 % IV SOLN: INTRAVENOUS | Status: DC

## 2020-09-10 NOTE — Discharge Instructions (Addendum)
The testing today did not show any serious problems.  If you continue to feel constipated use a stool softener like Colace, twice a day until having daily soft bowel movements.  Make sure you are drinking 2 or 3 glasses of water each day and eating a high-fiber diet, to minimize chances of constipation.  Make sure you are taking all of your medicine as directed.  You can use Tylenol if needed for pain.

## 2020-09-10 NOTE — ED Notes (Signed)
Patient transported to CT 

## 2020-09-10 NOTE — ED Provider Notes (Signed)
[facility] EMERGENCY DEPARTMENT Provider Note   CSN: [id number] Arrival date & time: 09/10/20  1031     History Chief Complaint  Patient presents with  . Abdominal Pain    [name] is a 69 y.o. male.  HPI Patient complains of abdominal pain, and nausea for several days.  He has been trying to make himself vomit by sticking his finger into his throat, without relief.  He states he is able to eat normally but has not had a bowel movement in several days.  He states he has had similar problems for 6 weeks.  He is a somewhat poor historian who cannot give me details of his recent or past medical history.  Patient denies fever, chills, cough, shortness of breath, weakness or dizziness.  There are no other known modifying factors.    Past Medical History:  Diagnosis Date  . AAA (abdominal aortic aneurysm) (HCC)   . Anxiety   . Arthritis   . COPD (chronic obstructive pulmonary disease) (HCC)   . 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 MI (myocardial infarction) (HCC)   . Pulmonary nodule    benign    Patient Active Problem List   Diagnosis Date Noted  . Trigeminal neuralgia of right side of face 04/13/2020  . Pulmonary nodule 08/14/2018  . Unstable angina (HCC)   . Gastroesophageal reflux disease   . Nausea without vomiting   . Elevated MCV 05/24/2017  . COPD (chronic obstructive pulmonary disease) (HCC) 05/23/2017  . Anxiety 05/23/2017  . Angina, class II (HCC) 11/11/2016  . Abnormal nuclear stress test 11/11/2016  . Chest pain, non-cardiac 10/09/2016  . Coronary artery disease involving native heart with angina pectoris (HCC) 10/09/2016  . Tobacco abuse 10/09/2016  . Pure hypercholesterolemia 03/21/2015  . 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 AAA (abdominal aortic aneurysm) without rupture (HCC) 03/01/2014     Past Surgical History:  Procedure Laterality Date  . BIOPSY  09/08/2018   Procedure: BIOPSY;  Surgeon: West Bali, MD;  [facility];  Service: Endoscopy;;  gastric  . CARDIAC CATHETERIZATION  01/2015   Coronary angiography on 02/17/15 demonstrated the left main coronary artery to have mild 30-40% stenosis, 100% LAD stenosis with right to left collaterals and faint left to left collaterals, small nondominant left circumflex, small to medium caliber first obtuse marginal branch with 20% ostial and proximal stenosis. The RCA was noted to have diffuse disease.  Marland Kitchen CARDIAC CATHETERIZATION N/A 11/11/2016   Procedure: Left Heart Cath and Cors/Grafts Angiography;  Surgeon: Marykay Lex, MD;  Location: [facility] INVASIVE CV LAB;  Service: Cardiovascular;  Laterality: N/A;  . CORONARY ARTERY BYPASS GRAFT  01/2015  . ESOPHAGOGASTRODUODENOSCOPY (EGD) WITH PROPOFOL N/A 09/08/2018   Procedure: ESOPHAGOGASTRODUODENOSCOPY (EGD) WITH PROPOFOL;  Surgeon: West Bali, MD;  [facility];  Service: Endoscopy;  Laterality: N/A;  2:45pm  . INGUINAL HERNIA REPAIR Bilateral   . VALVE REPLACEMENT  01/2015   Tricuspid Valve Replacement       Family History  Problem Relation Age of Onset  . Heart disease Father   . Heart attack Sister        Died in her 46s  . CAD Brother   . Heart attack Brother   . CAD Brother   . Heart attack Brother  Social History   Tobacco Use  . Smoking status: Current Every Day Smoker    Packs/day: 2.00    Years: 60.00    Pack years: 120.00    Types: Cigarettes    Last attempt to quit: 02/11/2015    Years since quitting: 5.5  . Smokeless tobacco: Never Used  Vaping Use  . Vaping Use: Never used  Substance Use Topics  . Alcohol use: Not Currently    Alcohol/week: 1.0 standard drink    Types: 1 Cans of beer per week    Comment: one beer a week  . Drug use: No    Home Medications Prior to Admission medications   Medication Sig Start Date  End Date Taking? Authorizing Provider  albuterol (VENTOLIN HFA) 108 (90 Base) MCG/ACT inhaler Inhale 1-2 puffs into the lungs every 6 (six) hours as needed for wheezing or shortness of breath. 08/29/20   Jeannie FendMurphy, Laura A, PA-C  ALPRAZolam Prudy Feeler(XANAX) 1 MG tablet Take 1 mg by mouth 4 (four) times daily as needed for anxiety.  09/17/16   [provider]  aspirin EC 81 MG tablet Take 81 mg by mouth daily.     [provider]  carbamazepine (TEGRETOL XR) 200 MG 12 hr tablet Take 400 mg by mouth 2 (two) times daily. Start with 1 tablet twice a week, then increase to 2 tablets twice daily 08/24/20   [provider]  HYDROcodone-acetaminophen (NORCO) 7.5-325 MG tablet Take 1 tablet by mouth every 6 (six) hours as needed for moderate pain.  08/16/16   [provider]  nitroGLYCERIN (NITROSTAT) 0.4 MG SL tablet PLACE 1 TABLET UNDER THE TONGUE EVERY 5 MINUTES AS NEEDED FOR CHEST PAIN Patient taking differently: Place 0.4 mg under the tongue every 5 (five) minutes as needed.  02/11/18   Jodelle GrossLawrence, Kathryn M, NP  omeprazole (PRILOSEC) 40 MG capsule Take 40 mg by mouth daily.  08/13/20   [provider]  pantoprazole (PROTONIX) 40 MG tablet Take 1 tablet (40 mg total) by mouth 2 (two) times daily before a meal. 08/14/18 08/29/20  Johnson, Clanford L, MD  Suvorexant 10 MG TABS Take by mouth.    [provider]  zolpidem (AMBIEN) 10 MG tablet Take 10 mg by mouth at bedtime as needed for sleep.  07/27/18   [provider]    Allergies    Patient has no known allergies.  Review of Systems   Review of Systems  All other systems reviewed and are negative.   Physical Exam Updated Vital Signs BP (!) 146/71   Pulse 66   Temp 98.2 F (36.8 C) (Oral)   Resp 17   Ht 5\' 6"  (1.676 m)   Wt 58 kg   SpO2 100%   BMI 20.64 kg/m   Physical Exam Vitals and nursing note reviewed.  Constitutional:      General: He is not in acute distress.    Appearance: He is  well-developed and normal weight. He is not ill-appearing, toxic-appearing or diaphoretic.  HENT:     Head: Normocephalic and atraumatic.     Right Ear: External ear normal.     Left Ear: External ear normal.  Eyes:     Conjunctiva/sclera: Conjunctivae normal.     Pupils: Pupils are equal, round, and reactive to light.  Neck:     Trachea: Phonation normal.  Cardiovascular:     Rate and Rhythm: Normal rate and regular rhythm.     Heart sounds: Normal heart sounds.  Pulmonary:  Effort: Pulmonary effort is normal. No respiratory distress.     Breath sounds: Normal breath sounds. No stridor.  Abdominal:     General: There is no distension.     Palpations: Abdomen is soft.     Tenderness: There is no abdominal tenderness.  Musculoskeletal:        General: Normal range of motion.     Cervical back: Normal range of motion and neck supple.  Skin:    General: Skin is warm and dry.  Neurological:     Mental Status: He is alert and oriented to person, place, and time.     Cranial Nerves: No cranial nerve deficit.     Sensory: No sensory deficit.     Motor: No abnormal muscle tone.     Coordination: Coordination normal.  Psychiatric:        Behavior: Behavior normal.     ED Results / Procedures / Treatments   Labs (all labs ordered are listed, but only abnormal results are displayed) Labs Reviewed  CBC WITH DIFFERENTIAL/PLATELET - Abnormal; Notable for the following components:      Result Value   RBC 4.07 (*)    MCV 105.2 (*)    MCH 35.4 (*)    All other components within normal limits  COMPREHENSIVE METABOLIC PANEL  LIPASE, BLOOD  URINALYSIS, ROUTINE W REFLEX MICROSCOPIC    EKG EKG Interpretation  Date/Time:  Sunday September 10 2020 10:43:12 EST Ventricular Rate:  67 PR Interval:    QRS Duration: 92 QT Interval:  373 QTC Calculation: 394 R Axis:   88 Text Interpretation: Sinus rhythm Right atrial enlargement Anterior infarct, old since last tracing no significant  change Confirmed by Mancel Bale [phone]) on 09/10/2020 10:52:47 AM   Radiology CT Abdomen Pelvis W Contrast  Result Date: 09/10/2020 CLINICAL DATA:  Upper abdominal pain, nausea, vomiting EXAM: CT ABDOMEN AND PELVIS WITH CONTRAST TECHNIQUE: Multidetector CT imaging of the abdomen and pelvis was performed using the standard protocol following bolus administration of intravenous contrast. CONTRAST:  OMNIPAQUE IOHEXOL 300 MG/ML  SOLN COMPARISON:  12/19/2019 FINDINGS: Lower chest: No acute abnormality.  Emphysema. Hepatobiliary: No solid liver abnormality is seen. No gallstones, gallbladder wall thickening, or biliary dilatation. Pancreas: Unremarkable. No pancreatic ductal dilatation or surrounding inflammatory changes. Spleen: Normal in size without significant abnormality. Adrenals/Urinary Tract: Adrenal glands are unremarkable. Kidneys are normal, without renal calculi, solid lesion, or hydronephrosis. Bladder is unremarkable. Stomach/Bowel: Stomach is within normal limits. Appendix appears normal. No evidence of bowel wall thickening, distention, or inflammatory changes. Vascular/Lymphatic: Aortic atherosclerosis. There is a large saccular aneurysm of the infrarenal abdominal aorta, measuring 5.0 x 4.5 cm, slightly enlarged compared to prior examination dated 12/17/2019, at which time it measured 4.7 x 4.3 cm when measured similarly (series 2, image 34). 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 No enlarged abdominal or pelvic lymph nodes. Reproductive: No mass or other significant abnormality. Other: Status post left inguinal hernia repair. No abdominopelvic ascites. Musculoskeletal: No acute or significant osseous findings. Chronic bilateral pars defects of L5. IMPRESSION: 1. No acute CT findings of the abdomen or pelvis to explain upper abdominal pain. 2.  There is a large saccular aneurysm of the infrarenal abdominal aorta, measuring 5.0 x 4.5 cm, slightly enlarged compared to prior examination dated 12/17/2019, at which time it measured 4.7 x 4.3 cm  when measured similarly. 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 Recommend follow-up CT/MR every 6 months and vascular consultation. This recommendation follows ACR consensus guidelines: White Paper of the ACR Incidental Findings Committee II on Vascular Findings. J Am Coll Radiol 2013; 10:789-794. Aortic Atherosclerosis (ICD10-I70.0) and Emphysema (ICD10-J43.9). Electronically Signed   By: Lauralyn Primes M.D.   On: 09/10/2020 13:10    Procedures Procedures (including critical care time)  Medications Ordered in ED Medications  0.9 %  sodium chloride infusion ( Intravenous Stopped 09/10/20 1350)  iohexol (OMNIPAQUE) 300 MG/ML solution 100 mL (100 mLs Intravenous Contrast Given 09/10/20 1207)    ED Course  I have reviewed the triage vital signs and the nursing notes.  Pertinent labs & imaging results that were available during my care of the patient were reviewed by me and considered in my medical decision making (see chart for details).  Clinical Course as of Sep 10 1401  Wynelle Link Sep 10, 2020  1328 I discussed the case findings with the patient's daughter Asher Muir, who brought him here to the ER earlier.  He was concerned that he felt better after having a dermatologic procedure of his lips.  She states he has been feeling bad for 2 or 3 months.  I discussed findings with her and the lack of abnormal findings that would require him to stay here in the emergency department or be hospitalized.  I encouraged her to have him follow-up with his PCP and she said she would do that.   [EW]    Clinical Course User Index [EW] Mancel Bale, MD   MDM Rules/Calculators/A&P                            Patient Vitals for the past 24 hrs:  BP Temp Temp src Pulse Resp SpO2 Height Weight  09/10/20 1330 (!) 146/71 -- -- 66 17 100 % -- --  09/10/20 1300 137/78 -- -- 66 -- 100 % -- --  09/10/20 1230 133/70 -- -- 70 19 100 % -- --  09/10/20 1200 (!) 124/53 -- -- (!) 54 -- 100 % -- --  09/10/20 1130 129/73 -- -- 60 20 96 % -- --  09/10/20 1115 100/78 -- -- 63 -- 99 % -- --  09/10/20 1044 127/79 98.2 F (36.8 C) Oral 62 (!) 21 96 % -- --  09/10/20 1041 -- -- -- -- -- --  (1.676 m) 58 kg    At time of discharge- reevaluation with update and discussion. After initial assessment and treatment, an updated evaluation reveals no further complaints.  Findings discussed with the patient and all questions were answered. Mancel Bale   Medical Decision Making:  This patient is presenting for evaluation of decreased stooling, with nausea and nonspecific complaints, which does require a range of treatment options, and is a complaint that involves a high risk of morbidity and mortality. The differential diagnoses include bowel obstruction, intra-abdominal infection, malaise, occult blood disorder. I decided to review old records, and in summary elderly male living at home, with ongoing symptoms for 6 weeks, somewhat difficult to elucidate.  I discussed case findings with patient's daughter on telephone.  Clinical Laboratory Tests Ordered, included CBC, Metabolic panel, Urinalysis and Lipase. Review indicates normal. Radiologic Tests Ordered, included CT abdomen pelvis.  I  independently Visualized: CT images, which show no acute abnormalities    Critical Interventions-clinical evaluation, laboratory testing, CT imaging, observation, reassessment.  Discussion with daughter.    After These Interventions, the Patient was reevaluated and was found nonspecific symptoms presenting with persistent complaints for 6 weeks, having had 1 ED evaluation and saw his vascular surgeon  during this time.  He is being followed closely for aortic abdominal aneurysm.   CRITICAL CARE-no Performed by: Mancel Bale  Nursing Notes Reviewed/ Care Coordinated Applicable Imaging Reviewed Interpretation of Laboratory Data incorporated into ED treatment  The patient appears reasonably screened and/or stabilized for discharge and I doubt any other medical condition or other [facility] requiring further screening, evaluation, or treatment in the ED at this time prior to discharge.  Plan: Home Medications-continue usual; Home Treatments-get plenty rest and drink a lot of fluids; return here if the recommended treatment, does not improve the symptoms; Recommended follow up-PCP follow-up as needed and for further evaluation if symptoms persist    Final Clinical Impression(s) / ED Diagnoses Final diagnoses:  Malaise    Rx / DC Orders ED Discharge Orders    None       Mancel Bale, MD 09/10/20 1402

## 2020-09-10 NOTE — ED Triage Notes (Signed)
Pt reports upper abd pain, n/v x 2 months.  Reports lbm was yesterday but was small.  Reports history of constipation.

## 2020-09-10 NOTE — ED Notes (Signed)
Pt was informed that we need a urine specimen.  

## 2020-09-11 ENCOUNTER — Encounter: Payer: Self-pay | Admitting: Cardiology

## 2020-09-11 ENCOUNTER — Ambulatory Visit: Payer: Medicare Other | Admitting: Cardiology

## 2020-09-11 ENCOUNTER — Ambulatory Visit (HOSPITAL_COMMUNITY): Payer: Medicare Other

## 2020-09-11 VITALS — BP 108/68 | HR 88 | Ht 66.0 in | Wt 128.8 lb

## 2020-09-11 DIAGNOSIS — I25119 Atherosclerotic heart disease of native coronary artery with unspecified angina pectoris: Secondary | ICD-10-CM

## 2020-09-11 DIAGNOSIS — I714 Abdominal aortic aneurysm, without rupture, unspecified: Secondary | ICD-10-CM

## 2020-09-11 NOTE — Patient Instructions (Addendum)
Medication Instructions:  Your physician recommends that you continue on your current medications as directed. Please refer to the Current Medication list given to you today.  Labwork: None ordered.  Testing/Procedures: None ordered.  Follow-Up:  Vascular surgeons will contact patient to schedule follow up with their office (Patient notified via telephone)  We are going to refer you to a structural cardiologist to follow you coronary artery disease.  Any Other Special Instructions Will Be Listed Below (If Applicable).  If you need a refill on your cardiac medications before your next appointment, please call your pharmacy.

## 2020-09-11 NOTE — Progress Notes (Signed)
Electrophysiology Office Note:    Date:  09/11/2020   ID:  [name], DOB 08-Jul-1951, MRN [id number]  PCP:  [name], MD  [facility] Cardiologist:  Prentice Docker, MD (Inactive)  [facility] Electrophysiologist:  Lanier Prude, MD   Referring MD: [name], MD   Chief Complaint: Abdominal pain  History of Present Illness:    [name] is a 69 y.o. male who presents for an evaluation of abdominal pain at the request of Dr. Sudie Bailey. Their medical history includes AAA with a saccular infrarenal aortic aneurysm with mural thrombus, COPD, coronary artery disease post bypass surgery, arthritis.  He tells me that he recently saw his vascular surgeon on August 24, 2020.  Since that appointment, the patient has developed significant abdominal pain in a bandlike distribution around his umbilicus.  He is also experiencing associated nausea.  The pain is localized to the abdomen does not radiate.  No chest pain.  Past Medical History:  Diagnosis Date  . AAA (abdominal aortic aneurysm) (HCC)   . Anxiety   . Arthritis   . COPD (chronic obstructive pulmonary disease) (HCC)   . 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 MI (myocardial infarction) (HCC)   . Pulmonary nodule    benign    Past Surgical History:  Procedure Laterality Date  . BIOPSY  09/08/2018   Procedure: BIOPSY;  Surgeon: West Bali, MD;  [facility];  Service: Endoscopy;;  gastric  . CARDIAC CATHETERIZATION  01/2015   Coronary angiography on 02/17/15 demonstrated the left main coronary artery to have mild 30-40% stenosis, 100% LAD stenosis with right to left collaterals and faint left to left collaterals, small nondominant left circumflex, small to medium caliber first obtuse marginal branch with 20% ostial and proximal stenosis. The RCA was  noted to have diffuse disease.  Marland Kitchen CARDIAC CATHETERIZATION N/A 11/11/2016   Procedure: Left Heart Cath and Cors/Grafts Angiography;  Surgeon: Marykay Lex, MD;  Location: [facility] INVASIVE CV LAB;  Service: Cardiovascular;  Laterality: N/A;  . CORONARY ARTERY BYPASS GRAFT  01/2015  . ESOPHAGOGASTRODUODENOSCOPY (EGD) WITH PROPOFOL N/A 09/08/2018   Procedure: ESOPHAGOGASTRODUODENOSCOPY (EGD) WITH PROPOFOL;  Surgeon: West Bali, MD;  [facility];  Service: Endoscopy;  Laterality: N/A;  2:45pm  . INGUINAL HERNIA REPAIR Bilateral   . VALVE REPLACEMENT  01/2015   Tricuspid Valve Replacement    Current Medications: Current Meds  Medication Sig  . albuterol (VENTOLIN HFA) 108 (90 Base) MCG/ACT inhaler Inhale 1-2 puffs into the lungs every 6 (six) hours as needed for wheezing or shortness of breath.  . ALPRAZolam (XANAX) 1 MG tablet Take 1 mg by mouth 4 (four) times daily as needed for anxiety.   Marland Kitchen aspirin EC 81 MG tablet Take 81 mg by mouth daily.   . carbamazepine (TEGRETOL XR) 200 MG 12 hr tablet Take 400 mg by mouth 2 (two) times daily. Start with 1 tablet twice a week, then increase to 2 tablets twice daily  . HYDROcodone-acetaminophen (NORCO) 7.5-325 MG tablet Take 1 tablet by mouth every 6 (six) hours as needed for moderate pain.   . nitroGLYCERIN (NITROSTAT) 0.4 MG SL tablet PLACE 1 TABLET UNDER THE TONGUE EVERY 5 MINUTES AS NEEDED FOR CHEST PAIN  . omeprazole (PRILOSEC) 40 MG capsule Take 40 mg by mouth daily.   Marland Kitchen  pantoprazole (PROTONIX) 40 MG tablet Take 1 tablet (40 mg total) by mouth 2 (two) times daily before a meal.  . zolpidem (AMBIEN) 10 MG tablet Take 10 mg by mouth at bedtime as needed for sleep.      Allergies:   Patient has no known allergies.   Social History   Socioeconomic History  . Marital status: Divorced    Spouse name: Not on file  . Number of children: Not on file  . Years of education: Not on file  . Highest education level: Not on file    Occupational History  . Not on file  Tobacco Use  . Smoking status: Current Every Day Smoker    Packs/day: 2.00    Years: 60.00    Pack years: 120.00    Types: Cigarettes    Last attempt to quit: 02/11/2015    Years since quitting: 5.5  . Smokeless tobacco: Never Used  Vaping Use  . Vaping Use: Never used  Substance and Sexual Activity  . Alcohol use: Not Currently    Alcohol/week: 1.0 standard drink    Types: 1 Cans of beer per week    Comment: one beer a week  . Drug use: No  . Sexual activity: Not on file  Other Topics Concern  . Not on file  Social History Narrative  . Not on file   Social Determinants of Health   Financial Resource Strain:   . Difficulty of Paying Living Expenses: Not on file  Food Insecurity:   . Worried About Programme researcher, broadcasting/film/video in the Last Year: Not on file  . Ran Out of Food in the Last Year: Not on file  Transportation Needs:   . Lack of Transportation (Medical): Not on file  . Lack of Transportation (Non-Medical): Not on file  Physical Activity:   . Days of Exercise per Week: Not on file  . Minutes of Exercise per Session: Not on file  Stress:   . Feeling of Stress : Not on file  Social Connections:   . Frequency of Communication with Friends and Family: Not on file  . Frequency of Social Gatherings with Friends and Family: Not on file  . Attends Religious Services: Not on file  . Active Member of Clubs or Organizations: Not on file  . Attends Banker Meetings: Not on file  . Marital Status: Not on file     Family History: The patient's family history includes CAD in his brother and brother; Heart attack in his brother, brother, and sister; Heart disease in his father.  ROS:   Please see the history of present illness.    All other systems reviewed and are negative.  EKGs/Labs/Other Studies Reviewed:    The following studies were reviewed today: CT abdomen, prior records   Recent Labs: 09/10/2020: ALT 12; BUN 12;  Creatinine, Ser 0.85; Hemoglobin 14.4; Platelets 193; Potassium 3.9; Sodium 135  Recent Lipid Panel    Component Value Date/Time   CHOL 145 08/14/2018 0353   TRIG 88 08/14/2018 0353   HDL 33 (L) 08/14/2018 0353   CHOLHDL 4.4 08/14/2018 0353   VLDL 18 08/14/2018 0353   LDLCALC 94 08/14/2018 0353    Physical Exam:    VS:  BP 108/68   Pulse 88   Ht 5\' 6"  (1.676 m)   Wt 128 lb 12.8 oz (58.4 kg)   SpO2 96%   BMI 20.79 kg/m     Wt Readings from Last 3 Encounters:  09/11/20  128 lb 12.8 oz (58.4 kg)  09/10/20 127 lb 13.9 oz (58 kg)  08/29/20 130 lb (59 kg)     GEN: Frail-appearing HEENT: Normal NECK: No JVD; No carotid bruits LYMPHATICS: No lymphadenopathy CARDIAC: RRR, no murmurs, rubs, gallops RESPIRATORY:  Clear to auscultation without rales, wheezing or rhonchi  ABDOMEN: Soft, non-tender, non-distended MUSCULOSKELETAL:  No edema; No deformity  SKIN: Warm and dry NEUROLOGIC:  Alert and oriented x 3 PSYCHIATRIC:  Normal affect   ASSESSMENT:    1. Coronary artery disease involving native coronary artery of native heart with angina pectoris (HCC)    PLAN:    In order of problems listed above:  1. Coronary artery disease no ischemic symptoms today.  The majority of today's visit was spent discussing his abdominal pain and coordinating care with his vascular surgery team.  2.  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  I reached out and spoke to his vascular surgeon.  Dr. Darrick Penna who he is previously seen is out today so I spoke to some of his partners including Dr. Chestine Spore.  They are planning on getting him in for an appointment tomorrow.  They reviewed the CT with me on the phone and did not see any findings suggestive of an impending dissection or rupture.  I have advised the patient to be on the look out for a phone call  from the vascular surgeons office.  Medication Adjustments/Labs and Tests Ordered: Current medicines are reviewed at length with the patient today.  Concerns regarding medicines are outlined above.  Orders Placed This Encounter  Procedures  . Ambulatory referral to Cardiology   No orders of the defined types were placed in this encounter.    Signed, Steffanie Dunn, MD, [facility]  09/11/2020 1:15 PM    Electrophysiology [facility]

## 2020-09-12 ENCOUNTER — Encounter: Payer: Self-pay | Admitting: Vascular Surgery

## 2020-09-12 ENCOUNTER — Ambulatory Visit: Payer: Medicare Other | Admitting: Vascular Surgery

## 2020-09-12 ENCOUNTER — Other Ambulatory Visit: Payer: Self-pay

## 2020-09-12 VITALS — BP 109/73 | HR 65 | Temp 98.2°F | Resp 20 | Ht 66.0 in | Wt 128.0 lb

## 2020-09-12 DIAGNOSIS — I714 Abdominal aortic aneurysm, without rupture, unspecified: Secondary | ICD-10-CM

## 2020-09-12 NOTE — Progress Notes (Signed)
Previously: Patient is a 69 year old male who returns for follow-up today.  He was last seen March 2021.  He has a known abdominal aortic aneurysm.  Last diameter was 4.8 cm on ultrasound in March 2021.  He has no abdominal or back pain.  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  He is continuing to smoke.  He is not really interested in quitting.  09/12/20: Returns for concern of abdominal pain. Reported as waxing/waning. Associated with N/V. Referred back to clinic to evaluate for possible symptomatic aneurysm.   Review of systems: He has no shortness of breath he has no claudication he has no chest pain  Past Medical History:  Diagnosis Date  . AAA (abdominal aortic aneurysm) (HCC)   . Anxiety   . Arthritis   . COPD (chronic obstructive pulmonary disease) (HCC)   . 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 MI (myocardial infarction) (HCC)   . Pulmonary nodule    benign    Past Surgical History:  Procedure Laterality Date  . BIOPSY  09/08/2018   Procedure: BIOPSY;  Surgeon: West Bali, MD;  [facility];  Service: Endoscopy;;  gastric  . CARDIAC CATHETERIZATION  01/2015   Coronary angiography on 02/17/15 demonstrated the left main coronary artery to have mild 30-40% stenosis, 100% LAD stenosis with right to left collaterals and faint left to left collaterals, small nondominant left circumflex, small to medium caliber first obtuse marginal branch with 20% ostial and proximal stenosis. The RCA was noted to have diffuse disease.  Marland Kitchen CARDIAC CATHETERIZATION N/A 11/11/2016   Procedure: Left Heart Cath and Cors/Grafts Angiography;  Surgeon: Marykay Lex, MD;  Location: [facility] INVASIVE CV LAB;  Service: Cardiovascular;  Laterality: N/A;  . CORONARY ARTERY BYPASS GRAFT  01/2015  . ESOPHAGOGASTRODUODENOSCOPY (EGD) WITH PROPOFOL N/A 09/08/2018    Procedure: ESOPHAGOGASTRODUODENOSCOPY (EGD) WITH PROPOFOL;  Surgeon: West Bali, MD;  [facility]: [facility];  Service: Endoscopy;  Laterality: N/A;  2:45pm  . INGUINAL HERNIA REPAIR Bilateral   . VALVE REPLACEMENT  01/2015   Tricuspid Valve Replacement   Physical exam:  Vitals:   09/12/20 1520  BP: 109/73  Pulse: 65  Resp: 20  Temp: 98.2 F (36.8 C)  SpO2: 100%  Weight: 128 lb (58.1 kg)  Height: 5\' 6"  (1.676 m)    Abdomen: Soft nontender nondistended easily palpable pulsatile epigastric mass  Extremities: 2+ right posterior tibial pulse absent pedal pulses left foot  Data: CT A/P shows 5cm AAA consistent with previous measurement by ultrasound. No findings worrisome for symptomatic aneuyrsm or "near rupture."   Assessment: 5 cm abdominal aortic aneurysm currently asymptomatic.  Consideration for repair if this reaches 5-1/2 cm.  Plan: Patient will follow up with repeat aortic ultrasound in 6 months. Continue workup for source of abdominal pain  . Rande Brunt, MD [facility] Phone Number: [phone] 09/12/2020 3:36 PM

## 2020-09-13 ENCOUNTER — Ambulatory Visit (HOSPITAL_COMMUNITY)
Admission: RE | Admit: 2020-09-13 | Discharge: 2020-09-13 | Disposition: A | Discharge status: Home / Self Care | Payer: Medicare Other | Source: Ambulatory Visit | Attending: Family Medicine | Admitting: Family Medicine

## 2020-09-13 DIAGNOSIS — I714 Abdominal aortic aneurysm, without rupture, unspecified: Secondary | ICD-10-CM

## 2020-09-13 DIAGNOSIS — R110 Nausea: Secondary | ICD-10-CM

## 2020-09-13 IMAGING — US US ABDOMEN LIMITED
1 series · 14 of 25 positions shown · non-contrast
Comparison: CT [DATE]

CLINICAL DATA: Nausea

EXAM:
ULTRASOUND ABDOMEN LIMITED RIGHT UPPER QUADRANT

[Series 1: us abdomen limited ruq (liver/gb) · 14 of 62 slices shown]
[im 1/62]
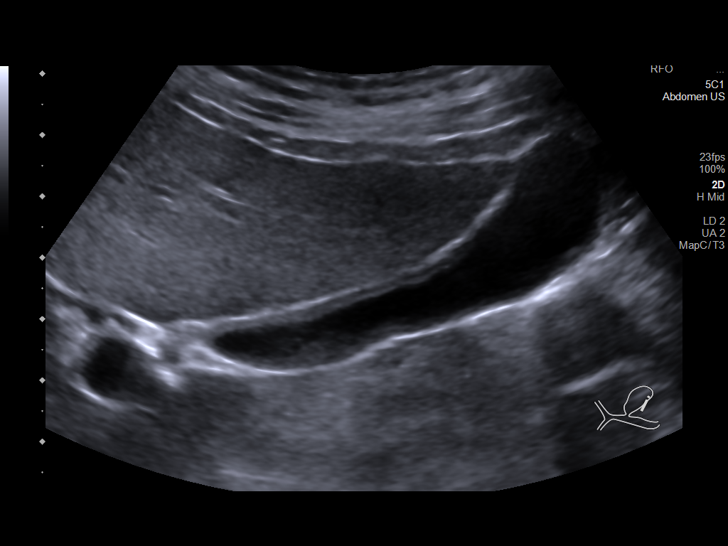
[im 6/62]
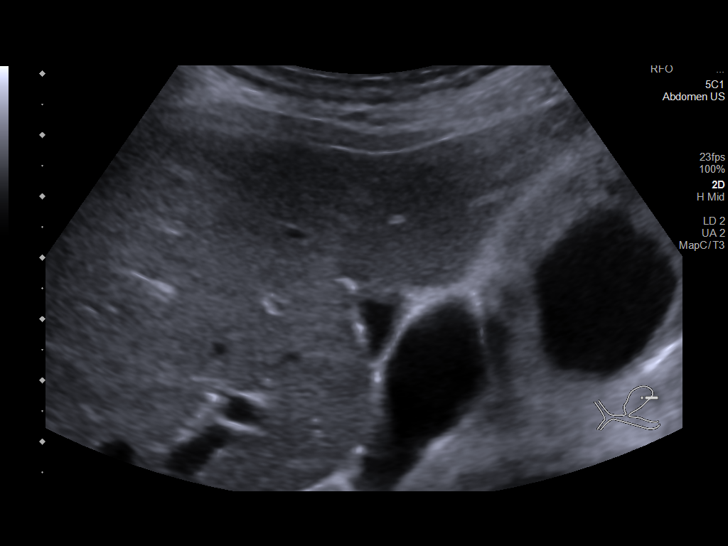
[im 11/62]
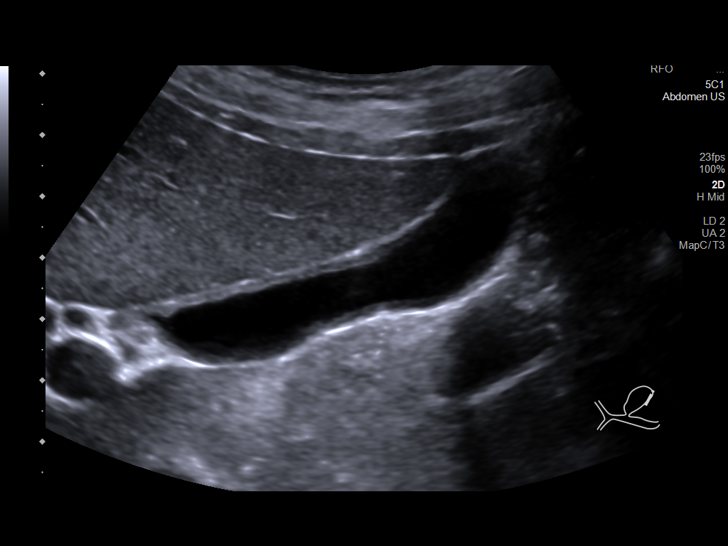
[im 16/62]
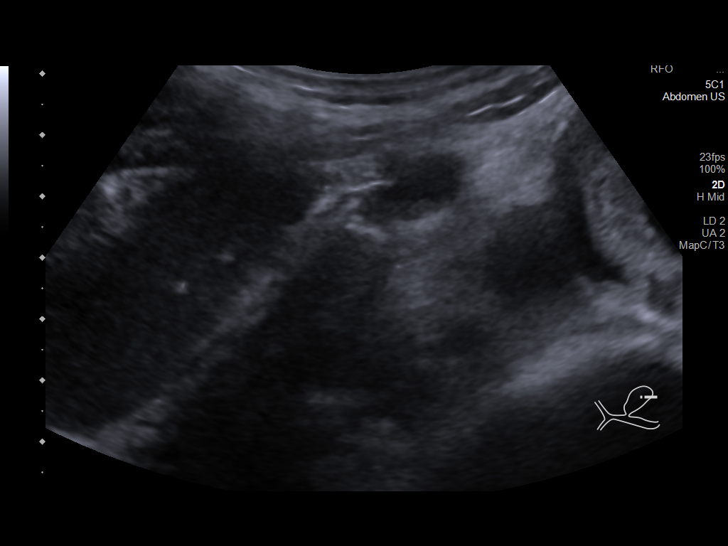
[im 21/62]
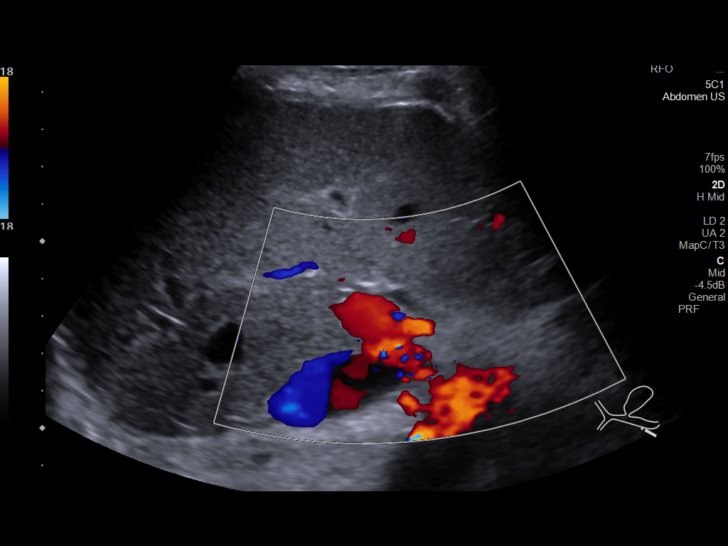
[im 23/62]
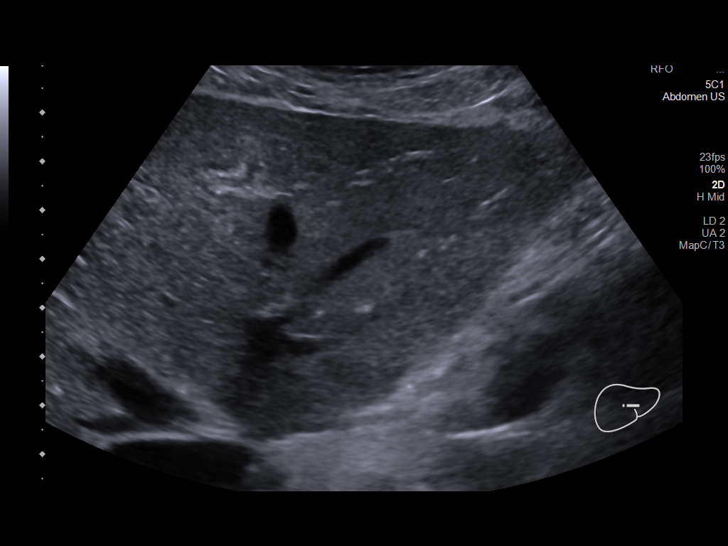
[im 28/62]
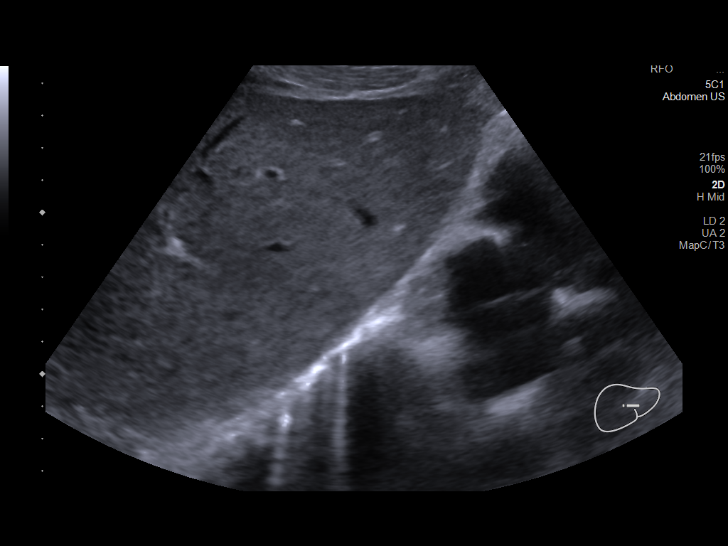
[im 34/62]
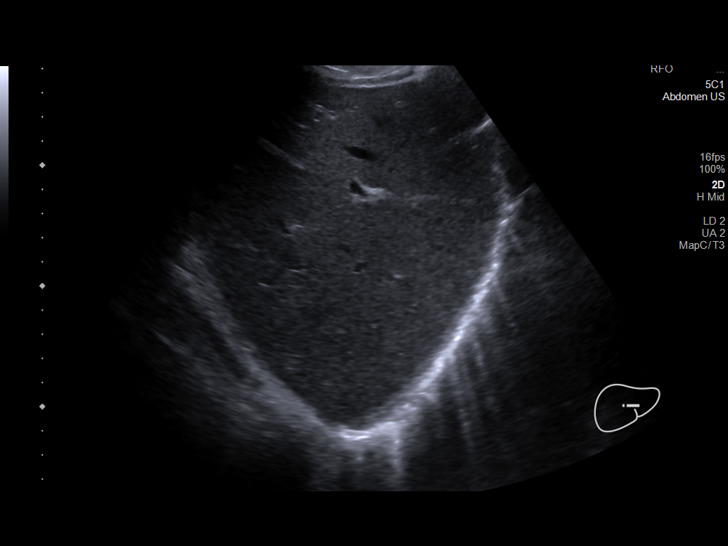
[im 39/62]
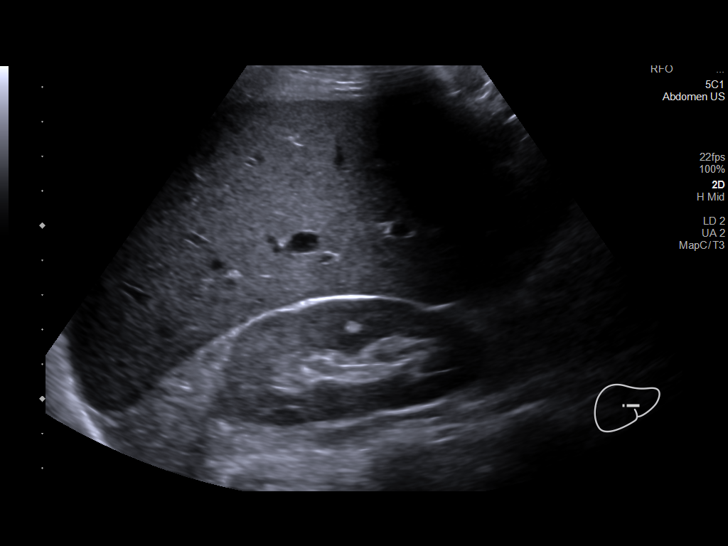
[im 41/62]
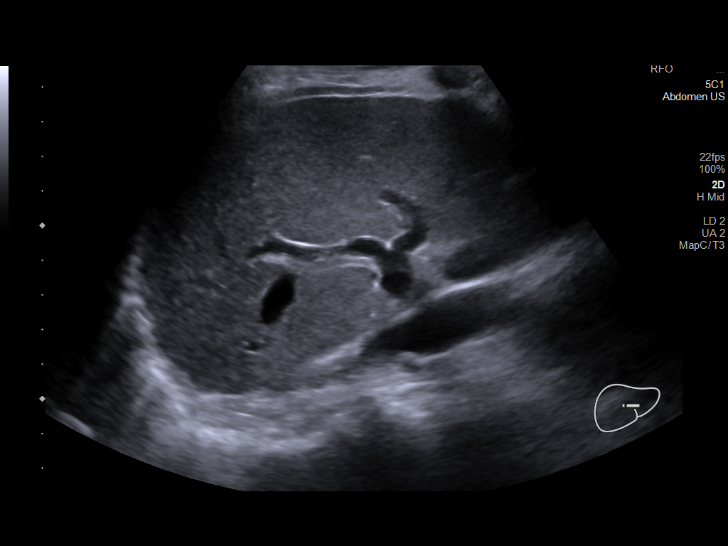
[im 46/62]
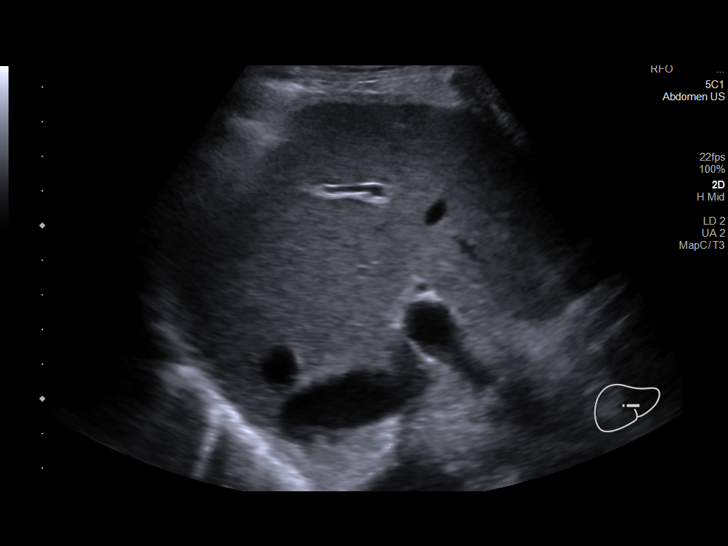
[im 51/62]
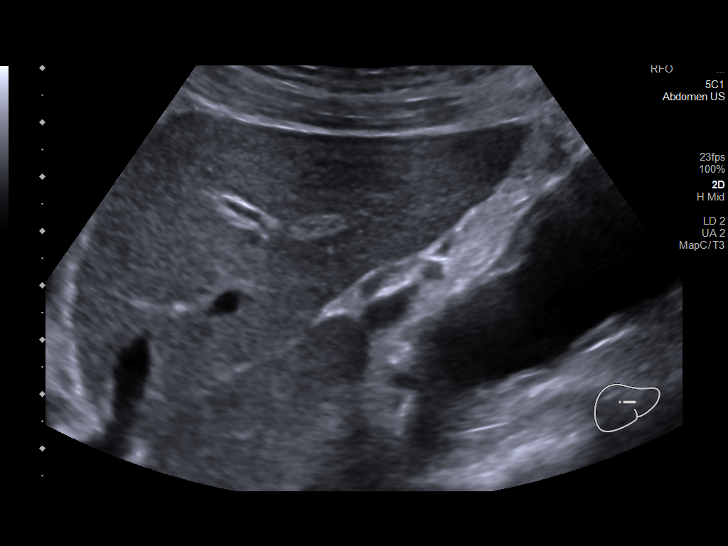
[im 56/62]
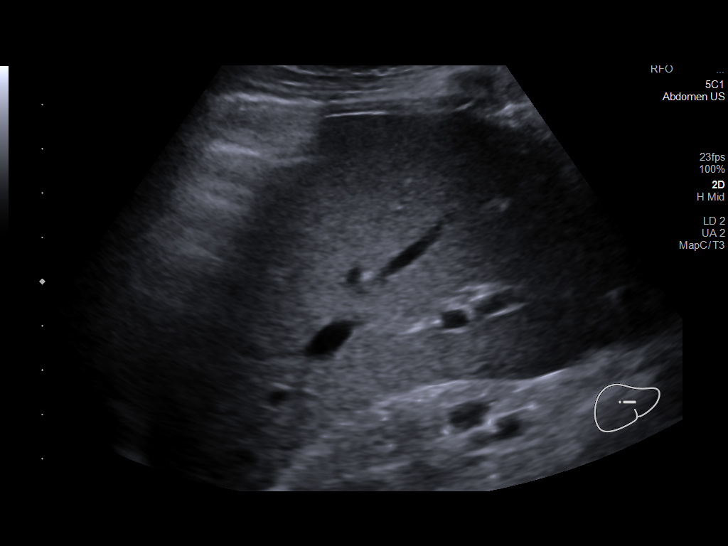
[im 62/62]
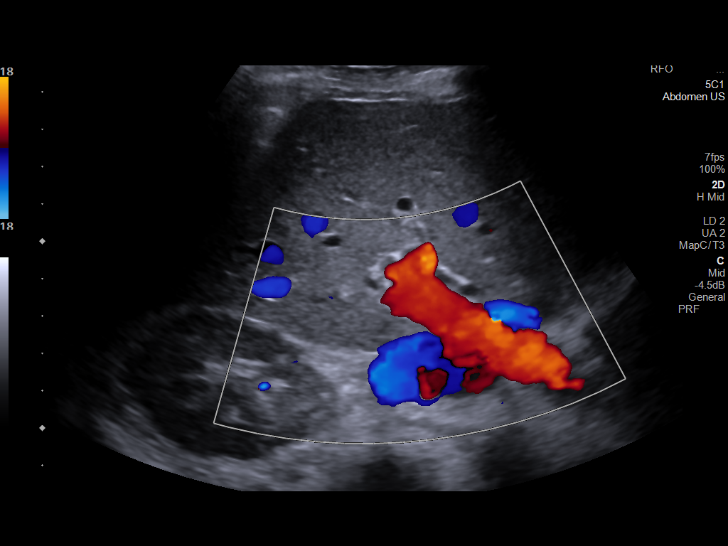

[14 of 25 positions shown; findings below may reference images not displayed]

FINDINGS: Gallbladder:

No gallstones or wall thickening visualized. No sonographic Murphy
sign noted by sonographer.

Common bile duct:

Diameter: Normal caliber, 3 mm

Liver:

No focal lesion identified. Within normal limits in parenchymal
echogenicity. Portal vein is patent on color Doppler imaging with
normal direction of blood flow towards the liver.

Other: Known large abdominal aortic aneurysm partially visualized.
IMPRESSION: No acute findings in the upper abdomen.

Large abdominal aortic aneurysm as seen on prior CT.

## 2020-09-27 ENCOUNTER — Encounter: Payer: Self-pay | Admitting: Internal Medicine

## 2020-10-11 ENCOUNTER — Telehealth: Payer: Self-pay

## 2020-10-11 NOTE — Telephone Encounter (Signed)
MD Guinevere Ferrari concerned patient needs to be seen sooner for AAA because he is continuing to have abdominal problems. We saw him after his CT scan in November. Aneurysm size was 4.8 cm. He is okay when he lays on his left and right side, but nauseous when he lays on his back. Discussed with PA, this is not a typical problem for AAA. Called MD office back, patient to keep his 10/31/20 appt at [facility]. MD may order a CT scan if he thinks it is necessary.

## 2020-10-31 ENCOUNTER — Ambulatory Visit: Payer: Medicare Other | Admitting: Vascular Surgery

## 2020-10-31 ENCOUNTER — Other Ambulatory Visit: Payer: Self-pay | Admitting: *Deleted

## 2020-10-31 DIAGNOSIS — I714 Abdominal aortic aneurysm, without rupture, unspecified: Secondary | ICD-10-CM

## 2020-11-06 ENCOUNTER — Ambulatory Visit: Payer: Medicare Other | Admitting: Gastroenterology

## 2020-11-10 ENCOUNTER — Ambulatory Visit: Payer: Medicare Other | Admitting: Cardiology

## 2020-11-10 ENCOUNTER — Encounter: Payer: Self-pay | Admitting: Cardiology

## 2020-11-10 NOTE — Progress Notes (Deleted)
Cardiology Office Note  Date: 11/10/2020   ID: [name], [name] 11-15-1950, MRN [id number]  PCP:  [name], MD  Cardiologist:  No primary care provider on file. Electrophysiologist:  None   No chief complaint on file.   History of Present Illness: [name] is a 70 y.o. male former patient of Dr. Purvis Sheffield now presenting to establish follow-up with me.  I reviewed his records and updated the chart.  I see that he had a visit with Dr. Lalla Brothers in November 2021, note reviewed.  He continues to follow with [facility] with history of 5 cm AAA, last seen in November 2021 as well.  Echocardiogram from 2019 revealed LVEF 45 to 50% range, mildly reduced RV contraction, mild mitral regurgitation, and stable tricuspid annuloplasty with no significant regurgitation.  Past Medical History:  Diagnosis Date  . AAA (abdominal aortic aneurysm) (HCC)   . Anxiety   . Arthritis   . COPD (chronic obstructive pulmonary disease) (HCC)   . 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 MI (myocardial infarction) (HCC)   . Pulmonary nodule    Benign  . Status post tricuspid valve repair     Past Surgical History:  Procedure Laterality Date  . BIOPSY  09/08/2018   Procedure: BIOPSY;  Surgeon: West Bali, MD;  [facility];  Service: Endoscopy;;  gastric  . CARDIAC CATHETERIZATION  01/2015   Coronary angiography on 02/17/15 demonstrated the left main coronary artery to have mild 30-40% stenosis, 100% LAD stenosis with right to left collaterals and faint left to left collaterals, small nondominant left circumflex, small to medium caliber first obtuse marginal branch with 20% ostial and proximal stenosis. The RCA was noted to have diffuse disease.  Marland Kitchen CARDIAC CATHETERIZATION N/A 11/11/2016   Procedure: Left Heart Cath and Cors/Grafts Angiography;   Surgeon: Marykay Lex, MD;  Location: [facility] INVASIVE CV LAB;  Service: Cardiovascular;  Laterality: N/A;  . CORONARY ARTERY BYPASS GRAFT  01/2015  . ESOPHAGOGASTRODUODENOSCOPY (EGD) WITH PROPOFOL N/A 09/08/2018   Procedure: ESOPHAGOGASTRODUODENOSCOPY (EGD) WITH PROPOFOL;  Surgeon: West Bali, MD;  [facility];  Service: Endoscopy;  Laterality: N/A;  2:45pm  . INGUINAL HERNIA REPAIR Bilateral   . VALVE REPLACEMENT  01/2015   Tricuspid Valve Replacement    Current Outpatient Medications  Medication Sig Dispense Refill  . albuterol (VENTOLIN HFA) 108 (90 Base) MCG/ACT inhaler Inhale 1-2 puffs into the lungs every 6 (six) hours as needed for wheezing or shortness of breath. 18 g 0  . ALPRAZolam (XANAX) 1 MG tablet Take 1 mg by mouth 4 (four) times daily as needed for anxiety.   2  . aspirin EC 81 MG tablet Take 81 mg by mouth daily.     . carbamazepine (TEGRETOL XR) 200 MG 12 hr tablet Take 400 mg by mouth 2 (two) times daily. Start with 1 tablet twice a week, then increase to 2 tablets twice daily    . HYDROcodone-acetaminophen (NORCO) 7.5-325 MG tablet Take 1 tablet by mouth every 6 (six) hours as needed for moderate pain.   0  . nitroGLYCERIN (NITROSTAT) 0.4 MG SL tablet PLACE 1 TABLET UNDER THE TONGUE EVERY 5 MINUTES AS NEEDED FOR CHEST PAIN 100 tablet 0  . omeprazole (PRILOSEC) 40 MG capsule Take 40 mg by mouth daily.     Marland Kitchen  pantoprazole (PROTONIX) 40 MG tablet Take 1 tablet (40 mg total) by mouth 2 (two) times daily before a meal. 60 tablet 0  . zolpidem (AMBIEN) 10 MG tablet Take 10 mg by mouth at bedtime as needed for sleep.   1   No current facility-administered medications for this visit.   Allergies:  Patient has no known allergies.   Social History: The patient  reports that he has been smoking cigarettes. He has a 120.00 pack-year smoking history. He has never used smokeless tobacco. He reports previous alcohol use of about 1.0 standard drink of alcohol per week. He  reports that he does not use drugs.   Family History: The patient's family history includes CAD in his brother and brother; Heart attack in his brother, brother, and sister; Heart disease in his father.   ROS:  Please see the history of present illness. Otherwise, complete review of systems is positive for {NONE DEFAULTED:18576::"none"}.  All other systems are reviewed and negative.   Physical Exam: VS:  There were no vitals taken for this visit., BMI There is no height or weight on file to calculate BMI.  Wt Readings from Last 3 Encounters:  09/12/20 128 lb (58.1 kg)  09/11/20 128 lb 12.8 oz (58.4 kg)  09/10/20 127 lb 13.9 oz (58 kg)    General: Patient appears comfortable at rest. HEENT: Conjunctiva and lids normal, oropharynx clear with moist mucosa. Neck: Supple, no elevated JVP or carotid bruits, no thyromegaly. Lungs: Clear to auscultation, nonlabored breathing at rest. Cardiac: Regular rate and rhythm, no S3 or significant systolic murmur, no pericardial rub. Abdomen: Soft, nontender, no hepatomegaly, bowel sounds present, no guarding or rebound. Extremities: No pitting edema, distal pulses 2+. Skin: Warm and dry. Musculoskeletal: No kyphosis. Neuropsychiatric: Alert and oriented x3, affect grossly appropriate.  ECG:  An ECG dated 09/10/2020 was personally reviewed today and demonstrated:  Sinus rhythm with poor R wave progression rule out old anterior infarct pattern.  Recent Labwork: 09/10/2020: ALT 12; AST 18; BUN 12; Creatinine, Ser 0.85; Hemoglobin 14.4; Platelets 193; Potassium 3.9; Sodium 135     Component Value Date/Time   CHOL 145 08/14/2018 0353   TRIG 88 08/14/2018 0353   HDL 33 (L) 08/14/2018 0353   CHOLHDL 4.4 08/14/2018 0353   VLDL 18 08/14/2018 0353   LDLCALC 94 08/14/2018 0353    Other Studies Reviewed Today:  Echocardiogram 08/14/2018: - Left ventricle: The cavity size was normal. Wall thickness was  normal. Systolic function was mildly reduced.  The estimated  ejection fraction was in the range of 45% to 50%. Diffuse  hypokinesis. Doppler parameters are consistent with restrictive  physiology, indicative of decreased left ventricular diastolic  compliance and/or increased left atrial pressure. Doppler  parameters are consistent with indeterminate ventricular filling  pressure.  - Regional wall motion abnormality: Hypokinesis of the mid  anterior, mid anteroseptal, and mid anterolateral myocardium.  - Aortic valve: Mildly thickened, moderately calcified leaflets.  Morphologically, there was at least mild if not mild to moderate  calcific aortic stenosis.  - Mitral valve: Mildly calcified annulus. Normal thickness leaflets  . There was mild regurgitation. Valve area by pressure half-time:  2.27 cm^2.  - Right ventricle: Systolic function was mildly reduced.  - Tricuspid valve: S/p tricuspid valve repair using a 26 mm [employer] M3 annuloplasty ring. There was no significant  regurgitation.  - Systemic veins: The IVC is dilated with normal respiratory  variation. Estimated right atrial pressure is 8 mmHg.  Abdominal ultrasound 08/24/2020: Summary:  Abdominal Aorta: There is evidence of abnormal dilatation of the distal  Abdominal aorta. The largest aortic diameter has increased compared to  prior exam. Previous diameter measurement was 3.8 x 4.1cm obtained on  12/26/17.  Stenosis: +-----------------+-------------+  Location     Stenosis     +-----------------+-------------+  Left Common Iliac>50% stenosis   Abdominal and pelvic CT 09/10/2020: IMPRESSION: 1. No acute CT findings of the abdomen or pelvis to explain upper abdominal pain. 2. There is a large saccular aneurysm of the infrarenal abdominal aorta, measuring 5.0 x 4.5 cm, slightly enlarged compared to prior examination dated 12/17/2019, at which time it measured 4.7 x 4.3 cm when measured similarly. 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 Recommend follow-up CT/MR every 6 months and vascular consultation. This recommendation follows ACR consensus guidelines: White Paper of the ACR Incidental Findings Committee II on Vascular Findings. J Am Coll Radiol 2013; 10:789-794.  Aortic Atherosclerosis (ICD10-I70.0) and Emphysema (ICD10-J43.9).  Assessment and Plan:   Medication Adjustments/Labs and Tests Ordered: Current medicines are reviewed at length with the patient today.  Concerns regarding medicines are outlined above.   Tests Ordered: No orders of the defined types were placed in this encounter.   Medication Changes: No orders of the defined types were placed in this encounter.   Disposition:  Follow up {follow up:15908}  Signed, Jonelle Sidle, MD, [facility] 11/10/2020 12:40 PM    [facility] [address] [place], [place] [address] Phone: [phone]; Fax: [phone]

## 2020-11-14 ENCOUNTER — Inpatient Hospital Stay: Admission: RE | Admit: 2020-11-14 | Payer: Medicare Other | Source: Ambulatory Visit

## 2020-11-14 ENCOUNTER — Ambulatory Visit: Payer: Medicare Other | Admitting: Vascular Surgery

## 2020-12-12 ENCOUNTER — Encounter: Payer: Self-pay | Admitting: Internal Medicine

## 2020-12-12 ENCOUNTER — Ambulatory Visit: Payer: Medicare Other | Admitting: Gastroenterology

## 2021-01-24 ENCOUNTER — Other Ambulatory Visit: Payer: Self-pay | Admitting: Vascular Surgery

## 2021-01-24 DIAGNOSIS — I714 Abdominal aortic aneurysm, without rupture, unspecified: Secondary | ICD-10-CM

## 2021-03-27 ENCOUNTER — Other Ambulatory Visit: Payer: Self-pay | Admitting: *Deleted

## 2021-04-27 ENCOUNTER — Emergency Department (HOSPITAL_COMMUNITY)
Admission: EM | Admit: 2021-04-27 | Discharge: 2021-04-27 | Disposition: A | Discharge status: Home / Self Care | Payer: Medicare Other | Attending: Emergency Medicine | Admitting: Emergency Medicine

## 2021-04-27 ENCOUNTER — Other Ambulatory Visit: Payer: Self-pay

## 2021-04-27 ENCOUNTER — Encounter (HOSPITAL_COMMUNITY): Payer: Self-pay | Admitting: Emergency Medicine

## 2021-04-27 ENCOUNTER — Emergency Department (HOSPITAL_COMMUNITY): Payer: Medicare Other

## 2021-04-27 DIAGNOSIS — Z7982 Long term (current) use of aspirin: Secondary | ICD-10-CM | POA: Insufficient documentation

## 2021-04-27 DIAGNOSIS — Z951 Presence of aortocoronary bypass graft: Secondary | ICD-10-CM | POA: Insufficient documentation

## 2021-04-27 DIAGNOSIS — J449 Chronic obstructive pulmonary disease, unspecified: Secondary | ICD-10-CM | POA: Diagnosis not present

## 2021-04-27 DIAGNOSIS — Z20822 Contact with and (suspected) exposure to covid-19: Secondary | ICD-10-CM | POA: Insufficient documentation

## 2021-04-27 DIAGNOSIS — R42 Dizziness and giddiness: Secondary | ICD-10-CM | POA: Diagnosis not present

## 2021-04-27 DIAGNOSIS — R0602 Shortness of breath: Secondary | ICD-10-CM | POA: Diagnosis present

## 2021-04-27 DIAGNOSIS — F17210 Nicotine dependence, cigarettes, uncomplicated: Secondary | ICD-10-CM | POA: Insufficient documentation

## 2021-04-27 DIAGNOSIS — I25110 Atherosclerotic heart disease of native coronary artery with unstable angina pectoris: Secondary | ICD-10-CM | POA: Insufficient documentation

## 2021-04-27 LAB — TROPONIN I (HIGH SENSITIVITY): Troponin I (High Sensitivity): 5 ng/L (ref <18)

## 2021-04-27 LAB — CBC WITH DIFFERENTIAL/PLATELET
Abs Immature Granulocytes: 0.04 10*3/uL (ref 0.00–0.07)
Basophils Absolute: 0 10*3/uL (ref 0.0–0.1)
Basophils Relative: 0 %
Eosinophils Absolute: 0.1 10*3/uL (ref 0.0–0.5)
Eosinophils Relative: 1 %
HCT: 43.2 % (ref 39.0–52.0)
Hemoglobin: 14.6 g/dL (ref 13.0–17.0)
Immature Granulocytes: 0 %
Lymphocytes Relative: 28 %
Lymphs Abs: 3.1 10*3/uL (ref 0.7–4.0)
MCH: 35.4 pg — ABNORMAL HIGH (ref 26.0–34.0)
MCHC: 33.8 g/dL (ref 30.0–36.0)
MCV: 104.6 fL — ABNORMAL HIGH (ref 80.0–100.0)
Monocytes Absolute: 1 10*3/uL (ref 0.1–1.0)
Monocytes Relative: 9 %
Neutro Abs: 6.8 10*3/uL (ref 1.7–7.7)
Neutrophils Relative %: 62 %
Platelets: 173 10*3/uL (ref 150–400)
RBC: 4.13 MIL/uL — ABNORMAL LOW (ref 4.22–5.81)
RDW: 13.8 % (ref 11.5–15.5)
WBC: 11.1 10*3/uL — ABNORMAL HIGH (ref 4.0–10.5)
nRBC: 0 % (ref 0.0–0.2)

## 2021-04-27 LAB — COMPREHENSIVE METABOLIC PANEL
ALT: 17 U/L (ref 0–44)
AST: 21 U/L (ref 15–41)
Albumin: 3.7 g/dL (ref 3.5–5.0)
Alkaline Phosphatase: 63 U/L (ref 38–126)
Anion gap: 6 (ref 5–15)
BUN: 16 mg/dL (ref 8–23)
CO2: 26 mmol/L (ref 22–32)
Calcium: 8.5 mg/dL — ABNORMAL LOW (ref 8.9–10.3)
Chloride: 103 mmol/L (ref 98–111)
Creatinine, Ser: 0.99 mg/dL (ref 0.61–1.24)
GFR, Estimated: >60 mL/min (ref >60)
Glucose, Bld: 86 mg/dL (ref 70–99)
Potassium: 3.7 mmol/L (ref 3.5–5.1)
Sodium: 135 mmol/L (ref 135–145)
Total Bilirubin: 0.4 mg/dL (ref 0.3–1.2)
Total Protein: 6.5 g/dL (ref 6.5–8.1)

## 2021-04-27 LAB — RESP PANEL BY RT-PCR (FLU A&B, COVID) ARPGX2
Influenza A by PCR: NEGATIVE
Influenza B by PCR: NEGATIVE
SARS Coronavirus 2 by RT PCR: NEGATIVE

## 2021-04-27 IMAGING — DX DG CHEST 1V
1 series · 1 of 1 positions shown · non-contrast
Comparison: [DATE]

CLINICAL DATA: Shortness of breath for 2 weeks

EXAM:
CHEST  1 VIEW

[chest ap]
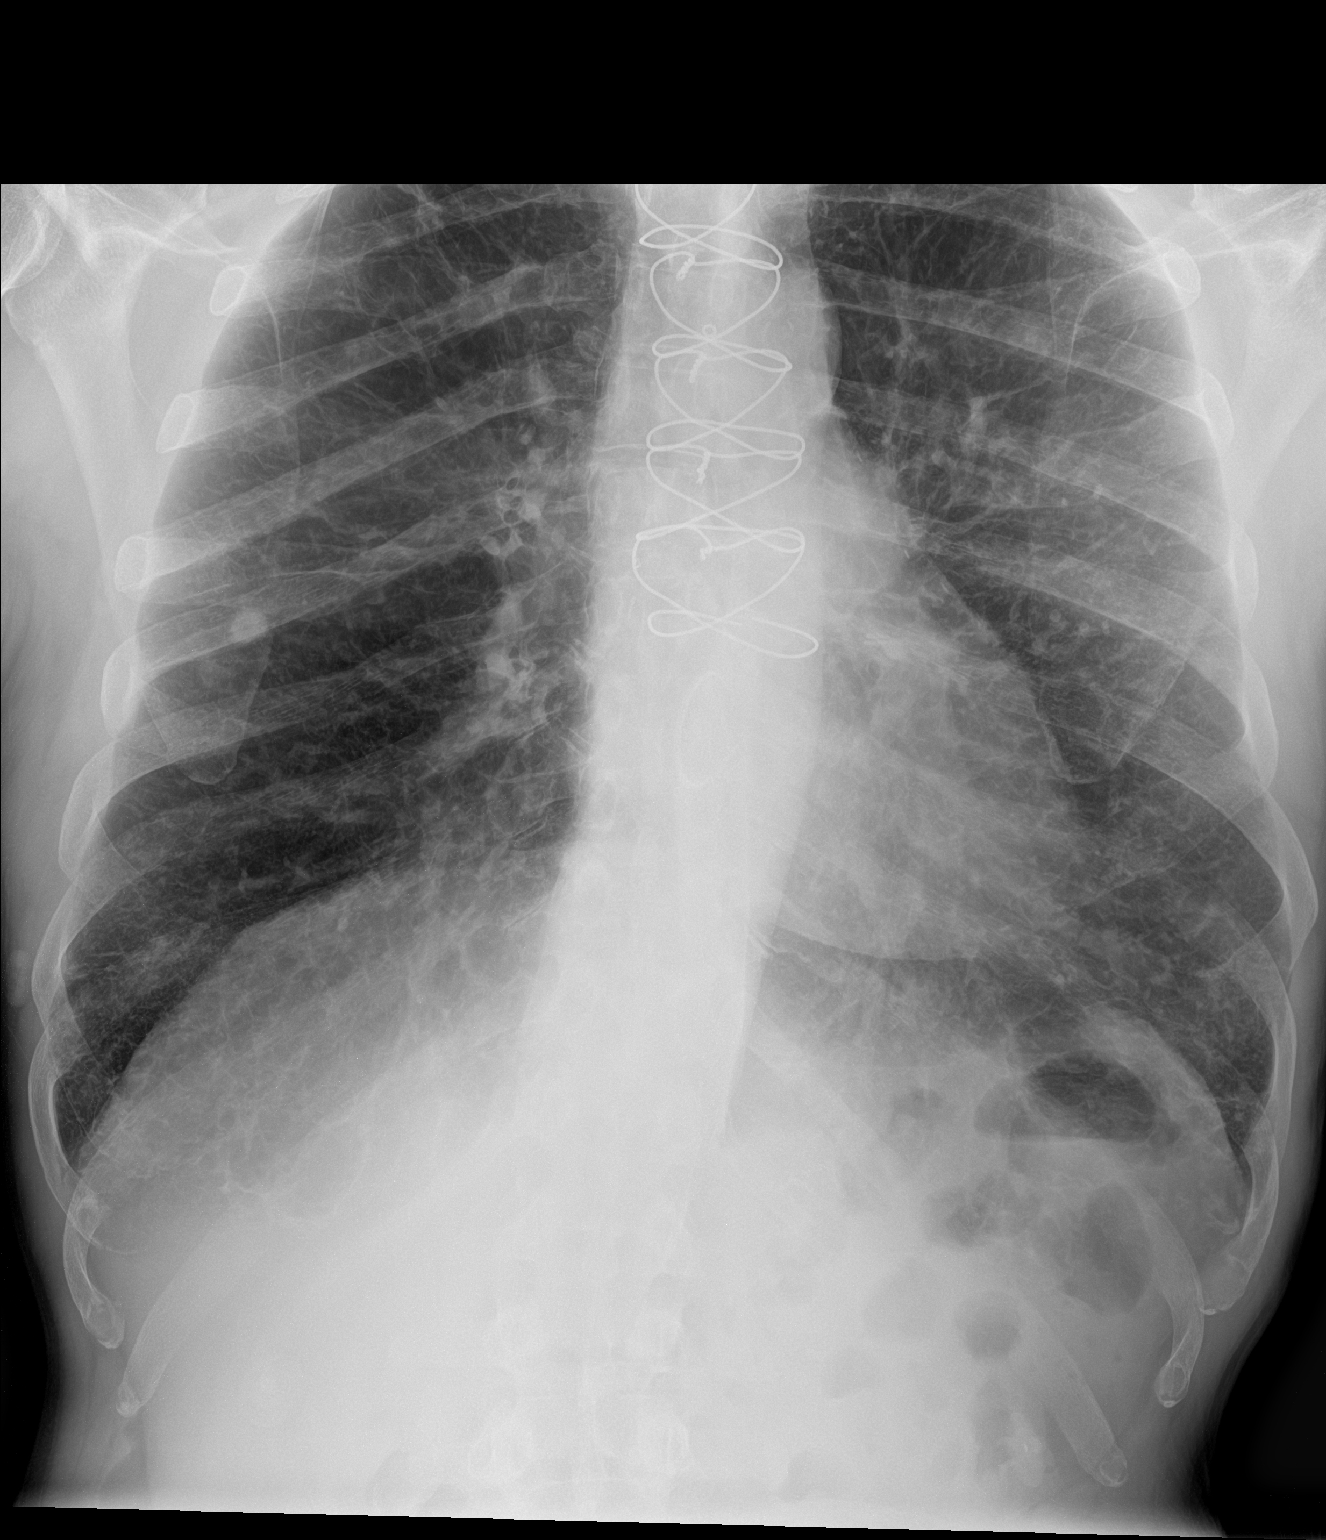

[1 of 1 positions shown; findings below may reference images not displayed]

FINDINGS: Normal heart size post TAVR.

Mediastinal contours and pulmonary vascularity normal.

Calcified granuloma RIGHT mid lung.

Emphysematous and bronchitic changes consistent with COPD.

Chronic interstitial prominence at lung bases, stable.

No acute infiltrate, pleural effusion, or pneumothorax.

No acute osseous findings.
IMPRESSION: Changes consistent with COPD and old granulomatous disease.

No acute abnormalities.

## 2021-04-27 IMAGING — MR MR HEAD W/O CM
9 of 10 series · 39 of 48 positions shown · non-contrast
Comparison: [DATE]

CLINICAL DATA: Vertigo

EXAM:
MRI HEAD WITHOUT CONTRAST
TECHNIQUE: Multiplanar, multiecho pulse sequences of the brain and surrounding
structures were obtained without intravenous contrast.

[Series 5: DWI · axial · 4.0mm · 0.88mm/px · z∈[-61,+79]mm · 4 of 36 slices shown (1 of 4)]
[im 1/36]
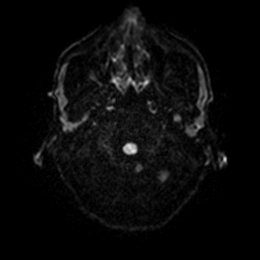
[im 12/36]
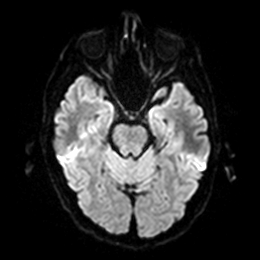
[im 24/36]
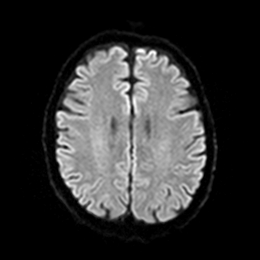
[im 36/36]
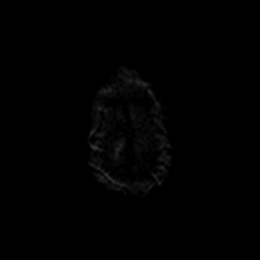

[Series 6: DWI · axial · 4.0mm · 0.88mm/px · z∈[-61,+79]mm · 5 of 35 slices shown (2 of 4)]
[im 1/35]
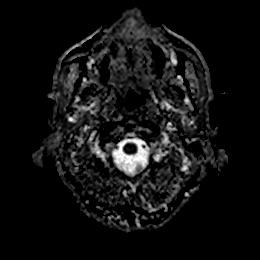
[im 9/35]
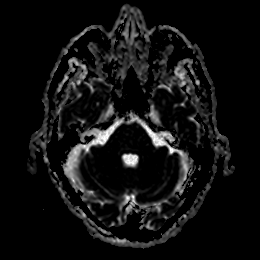
[im 18/35]
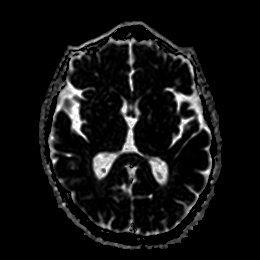
[im 26/35]
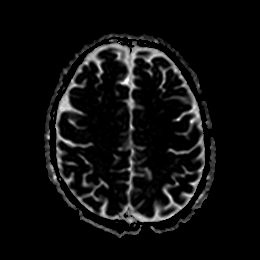
[im 35/35]
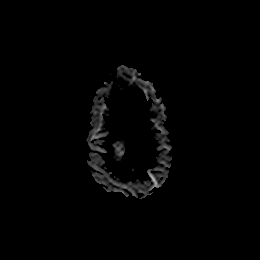

[Series 7: DWI · coronal · 4.0mm · 0.88mm/px · 5 of 32 slices shown (3 of 4)]
[im 1/32]
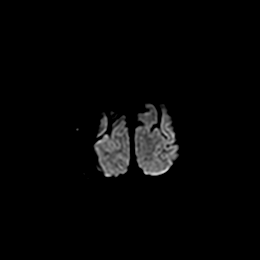
[im 8/32]
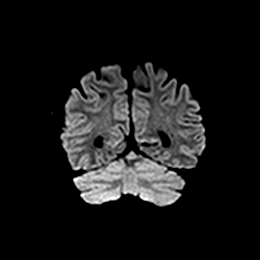
[im 16/32]
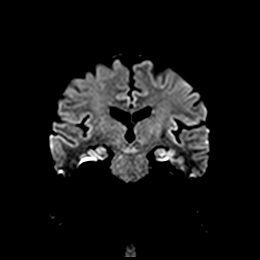
[im 24/32]
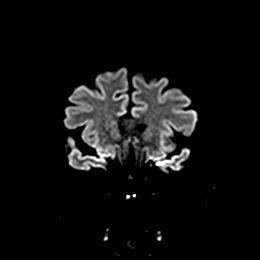
[im 32/32]
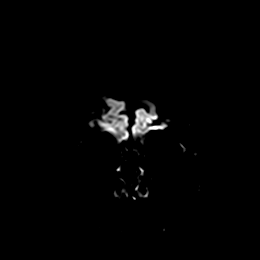

[Series 8: DWI · coronal · 4.0mm · 0.88mm/px · 5 of 32 slices shown (4 of 4)]
[im 1/32]
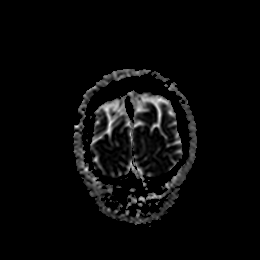
[im 8/32]
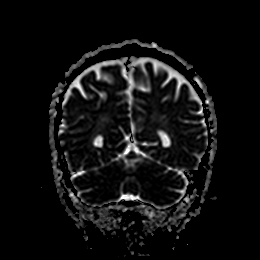
[im 16/32]
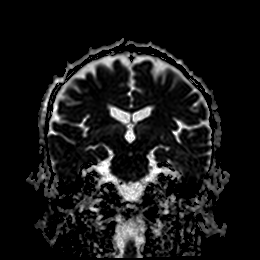
[im 24/32]
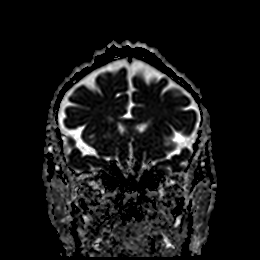
[im 32/32]
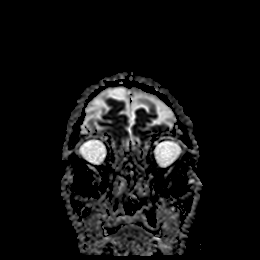

[Series 9: T1 · sagittal · 5.0mm · 0.80mm/px · 3 of 23 slices shown]
[im 1/23]
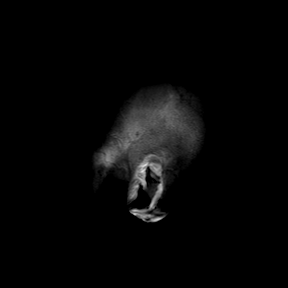
[im 12/23]
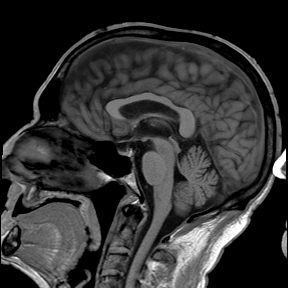
[im 23/23]
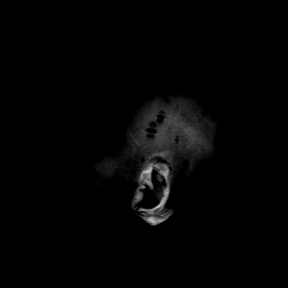

[Series 10: T2 · axial · 5.0mm · 0.72mm/px · z∈[-68,+79]mm · 3 of 22 slices shown (1 of 2)]
[im 1/22]
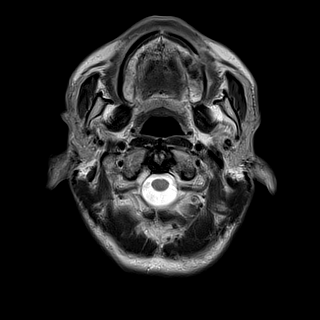
[im 11/22]
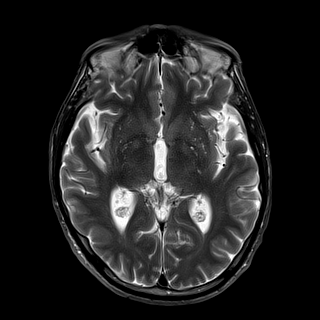
[im 22/22]
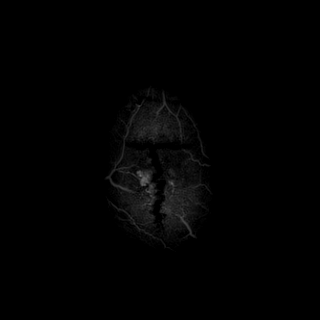

[Series 11: ax hemo · axial · 5.0mm · 0.86mm/px · z∈[-62,+82]mm · 4 of 25 slices shown]
[im 1/25]
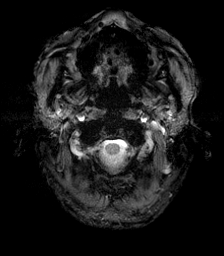
[im 9/25]
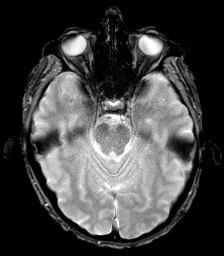
[im 17/25]
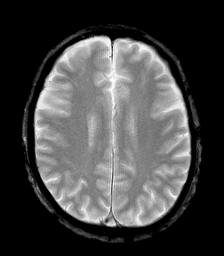
[im 25/25]
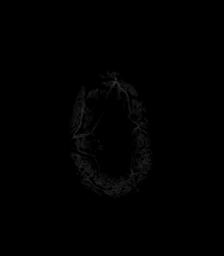

[Series 12: FLAIR · axial · 4.0mm · 0.43mm/px · z∈[-64,+84]mm · 6 of 38 slices shown]
[im 1/38]
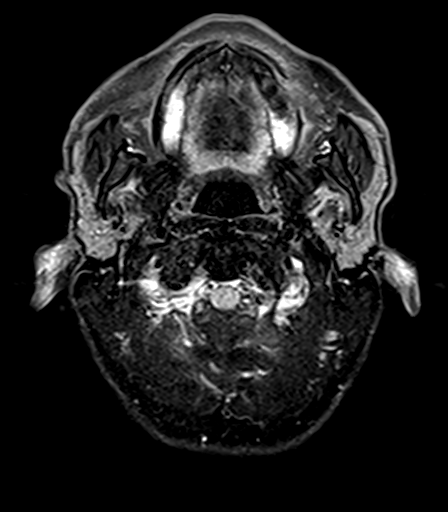
[im 8/38]
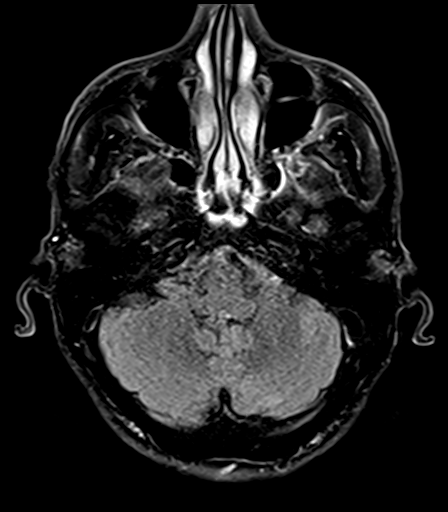
[im 15/38]
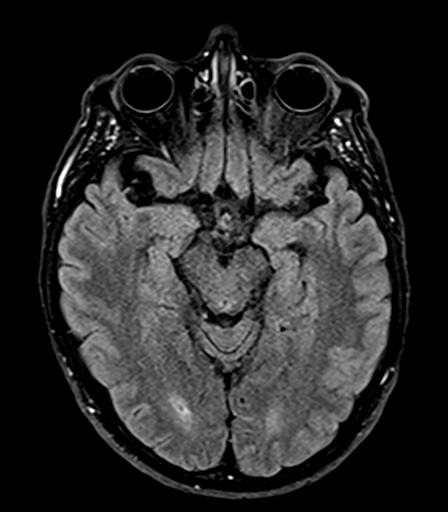
[im 23/38]
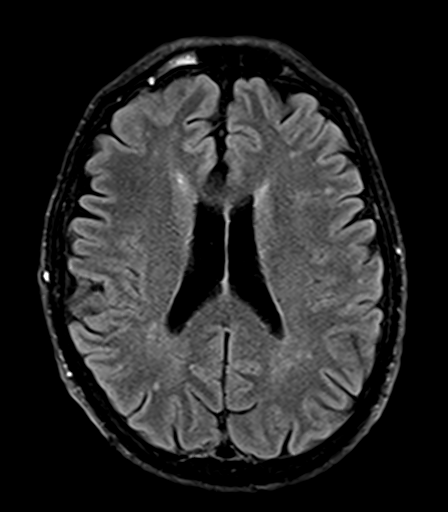
[im 30/38]
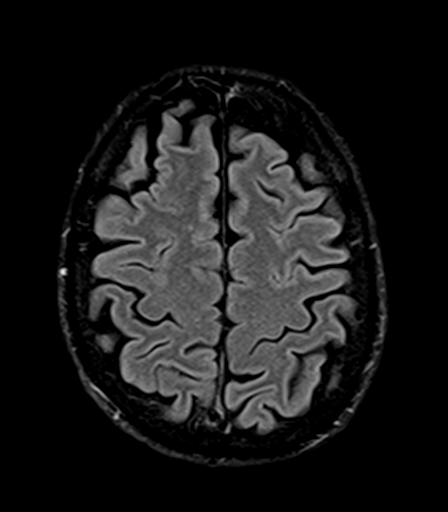
[im 38/38]
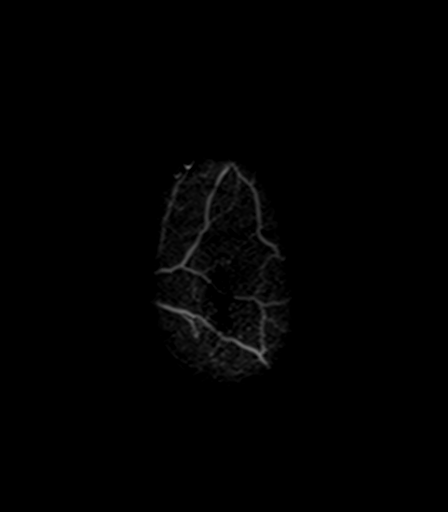

[Series 14: T2 · coronal · 5.0mm · 0.72mm/px · 4 of 28 slices shown (2 of 2)]
[im 1/28]
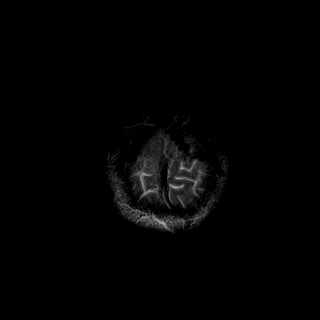
[im 10/28]
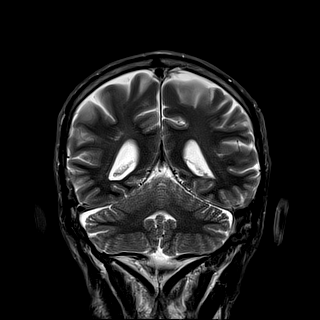
[im 19/28]
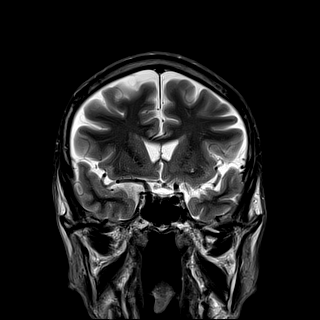
[im 28/28]
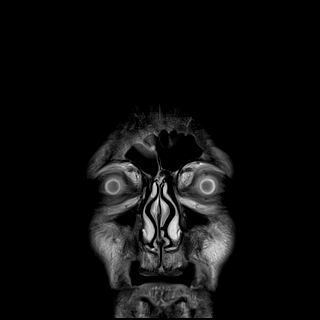

[39 of 48 positions shown; findings below may reference images not displayed]

FINDINGS: Brain: There is no acute infarction or intracranial hemorrhage.
There is no intracranial mass, mass effect, or edema. There is no
hydrocephalus or extra-axial fluid collection. Ventricles and sulci
are within normal limits in size and configuration. Patchy foci of
T2 hyperintensity in the supratentorial white matter are nonspecific
but may reflect mild chronic microvascular ischemic changes.
Punctate focus of susceptibility in the inferior left temporal lobe
probably reflects chronic microhemorrhage. Left cerebellar focus on
the prior study is not seen probably due to lower sensitivity of
technique.

Vascular: Major vessel flow voids at the skull base are preserved.

Skull and upper cervical spine: Normal marrow signal is preserved.

Sinuses/Orbits: Minor mucosal thickening.  Orbits are unremarkable.

Other: Sella is unremarkable.  Mastoid air cells are clear.
IMPRESSION: No evidence of recent infarction, hemorrhage, or mass.

Stable mild chronic microvascular ischemic changes.

## 2021-04-27 MED ORDER — MECLIZINE HCL 25 MG PO TABS: 25.0000 mg | ORAL_TABLET | Freq: Three times a day (TID) | ORAL | 0 refills | Status: AC | PRN

## 2021-04-27 MED ORDER — ALBUTEROL SULFATE (2.5 MG/3ML) 0.083% IN NEBU
2.5000 mg | INHALATION_SOLUTION | Freq: Once | RESPIRATORY_TRACT | Status: AC
  Administered 2021-04-27: 2.5 mg via RESPIRATORY_TRACT
  Filled 2021-04-27: qty 3

## 2021-04-27 MED ORDER — SODIUM CHLORIDE 0.9 % IV BOLUS
1000.0000 mL | Freq: Once | INTRAVENOUS | Status: AC
  Administered 2021-04-27: 1000 mL via INTRAVENOUS

## 2021-04-27 MED ORDER — ALBUTEROL SULFATE HFA 108 (90 BASE) MCG/ACT IN AERS: 2.0000 | INHALATION_SPRAY | RESPIRATORY_TRACT | Status: DC

## 2021-04-27 MED ORDER — ALBUTEROL SULFATE (2.5 MG/3ML) 0.083% IN NEBU: 2.5000 mg | INHALATION_SOLUTION | Freq: Once | RESPIRATORY_TRACT | Status: DC

## 2021-04-27 MED ORDER — PREDNISONE 10 MG PO TABS: 30.0000 mg | ORAL_TABLET | Freq: Every day | ORAL | 0 refills | Status: AC

## 2021-04-27 MED ORDER — MECLIZINE HCL 12.5 MG PO TABS
25.0000 mg | ORAL_TABLET | Freq: Once | ORAL | Status: AC
  Administered 2021-04-27: 25 mg via ORAL
  Filled 2021-04-27: qty 2

## 2021-04-27 MED ORDER — IPRATROPIUM-ALBUTEROL 20-100 MCG/ACT IN AERS: 2.0000 | INHALATION_SPRAY | Freq: Four times a day (QID) | RESPIRATORY_TRACT | Status: DC

## 2021-04-27 NOTE — ED Triage Notes (Signed)
Pt c/o SOB x 2 weeks. PC sent him to the ER

## 2021-04-27 NOTE — ED Notes (Signed)
Ambulated patient around unit.  Patient has a steady gait and states that he feels okay.

## 2021-04-27 NOTE — ED Provider Notes (Addendum)
[facility] EMERGENCY DEPARTMENT Provider Note   CSN: [id number] Arrival date & time: 04/27/21  1308     History Chief Complaint  Patient presents with   Shortness of Breath    [name] is a 70 y.o. male.  HPI  Patient with significant medical history of AAA 5 cm via ultrasound in December, anxiety, COPD, CAD presents to the emergency department with chief complaint of shortness of breath and dizziness.  Patient states this started approximately 1-1/2 weeks ago, states that he feels more short of breath on exertion as well as sitting, he has occasional chest pain which does not radiate, states is in the center of his chest, does not become diaphoretic, has intermittent nausea and vomiting, denies orthopnea or worsening leg swelling.  He also endorses some dizziness, dizziness described as a spinning sensation, it is worsened with change in position, he denies recent head trauma, is not on anticoagulant, denies history of CVA or known history of vertigo.  Patient states that he was recently in contact with COVID, states his son and grandson both had COVID about 1-1/2 weeks ago which is when his symptoms started.  States that he has been eating and drinking without difficulty, he denies fevers, chills, nasal congestion, sore throat, cough, general body aches.  He has no other symptoms at this time.  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   100% LAD, Severe dom RCA dz, small non-dom Cx --> CABG x 2 LIMA-LAD, SVG-RCA   MI (myocardial infarction) (HCC)    Pulmonary nodule    Benign   Status post tricuspid valve repair     Patient Active Problem List   Diagnosis Date Noted   Trigeminal neuralgia of right side of face 04/13/2020   Pulmonary nodule 08/14/2018   Unstable  angina (HCC)    Gastroesophageal reflux disease    Nausea without vomiting    Elevated MCV 05/24/2017   COPD (chronic obstructive pulmonary disease) (HCC) 05/23/2017   Anxiety 05/23/2017   Angina, class II (HCC) 11/11/2016   Abnormal nuclear stress test 11/11/2016   Chest pain, non-cardiac 10/09/2016   Coronary artery disease involving native heart with angina pectoris (HCC) 10/09/2016   Tobacco abuse 10/09/2016   Pure hypercholesterolemia 03/21/2015   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 The RCA was noted to have diffuse disease.   CARDIAC CATHETERIZATION N/A 11/11/2016   Procedure: Left Heart Cath and Cors/Grafts Angiography;  Surgeon: Marykay Lex, MD;  Location: [facility]VASIVE CV LAB;  Service: Cardiovascular;  Laterality: N/A;   CORONARY ARTERY BYPASS GRAFT  01/2015   ESOPHAGOGASTRODUODENOSCOPY (EGD) WITH PROPOFOL N/A 09/08/2018   Procedure: ESOPHAGOGASTRODUODENOSCOPY (EGD) WITH PROPOFOL;  Surgeon: Jonette Eva  L, MD;  Location: [facility];  Service: Endoscopy;  Laterality: N/A;  2:45pm   INGUINAL HERNIA REPAIR Bilateral    VALVE REPLACEMENT  01/2015   Tricuspid Valve Replacement       Family History  Problem Relation Age of Onset   Heart disease Father    Heart attack Sister        Died in her 67s   CAD Brother    Heart attack Brother    CAD Brother    Heart  attack Brother     Social History   Tobacco Use   Smoking status: Every Day    Packs/day: 2.00    Years: 60.00    Pack years: 120.00    Types: Cigarettes    Last attempt to quit: 02/11/2015    Years since quitting: 6.2   Smokeless tobacco: Never  Vaping Use   Vaping Use: Never used  Substance Use Topics   Alcohol use: Not Currently    Alcohol/week: 1.0 standard drink    Types: 1 Cans of beer per week    Comment: one beer a week   Drug use: No    Home Medications Prior to Admission medications   Medication Sig Start Date End Date Taking? Authorizing Provider  meclizine (ANTIVERT) 25 MG tablet Take 1 tablet (25 mg total) by mouth 3 (three) times daily as needed for dizziness. 04/27/21  Yes Carroll Sage, PA-C  predniSONE (DELTASONE) 10 MG tablet Take 3 tablets (30 mg total) by mouth daily for 4 days. 04/27/21 05/01/21 Yes Carroll Sage, PA-C  albuterol (VENTOLIN HFA) 108 (90 Base) MCG/ACT inhaler Inhale 1-2 puffs into the lungs every 6 (six) hours as needed for wheezing or shortness of breath. 08/29/20   Jeannie Fend, PA-C  ALPRAZolam Prudy Feeler) 1 MG tablet Take 1 mg by mouth 4 (four) times daily as needed for anxiety.  09/17/16   [provider]  aspirin EC 81 MG tablet Take 81 mg by mouth daily.     [provider]  carbamazepine (TEGRETOL XR) 200 MG 12 hr tablet Take 400 mg by mouth 2 (two) times daily. Start with 1 tablet twice a week, then increase to 2 tablets twice daily 08/24/20   [provider]  HYDROcodone-acetaminophen (NORCO) 7.5-325 MG tablet Take 1 tablet by mouth every 6 (six) hours as needed for moderate pain.  08/16/16   [provider]  nitroGLYCERIN (NITROSTAT) 0.4 MG SL tablet PLACE 1 TABLET UNDER THE TONGUE EVERY 5 MINUTES AS NEEDED FOR CHEST PAIN 02/11/18   Jodelle Gross, NP  omeprazole (PRILOSEC) 40 MG capsule Take 40 mg by mouth daily.  08/13/20   [provider]  pantoprazole (PROTONIX) 40 MG tablet Take  1 tablet (40 mg total) by mouth 2 (two) times daily before a meal. 08/14/18 09/11/20  Johnson, Clanford L, MD  zolpidem (AMBIEN) 10 MG tablet Take 10 mg by mouth at bedtime as needed for sleep.  07/27/18   [provider]    Allergies    Patient has no known allergies.  Review of Systems   Review of Systems  Constitutional:  Negative for chills and fever.  HENT:  Negative for congestion.   Respiratory:  Positive for shortness of breath. Negative for cough.   Cardiovascular:  Negative for chest pain.  Gastrointestinal:  Negative for abdominal pain.  Genitourinary:  Negative for enuresis.  Musculoskeletal:  Negative for back pain and myalgias.  Skin:  Negative for rash.  Neurological:  Positive for headaches. Negative for dizziness.  Hematological:  Does not bruise/bleed easily.   Physical Exam Updated Vital Signs BP 138/76   Pulse 61   Temp 98.6 F (37 C)   Resp 18   Ht 5\' 7"  (1.702 m)   Wt 60.3 kg   SpO2 98%   BMI 20.83 kg/m   Physical Exam Vitals and nursing note reviewed.  Constitutional:      General: He is not in acute distress.    Appearance: He is not ill-appearing.  HENT:     Head: Normocephalic and atraumatic.     Nose: No congestion.     Mouth/Throat:     Mouth: Mucous membranes are dry.     Pharynx: Oropharynx is clear.  Eyes:     Extraocular Movements: Extraocular movements intact.     Conjunctiva/sclera: Conjunctivae normal.     Pupils: Pupils are equal, round, and reactive to light.  Cardiovascular:     Rate and Rhythm: Normal rate and regular rhythm.     Pulses: Normal pulses.     Heart sounds: No murmur heard.   No friction rub. No gallop.  Pulmonary:     Effort: No respiratory distress.     Breath sounds: No wheezing, rhonchi or rales.  Abdominal:     Palpations: Abdomen is soft.     Tenderness: There is no abdominal tenderness.  Musculoskeletal:     Cervical back: No tenderness.     Right lower leg: No edema.     Left lower leg:  No edema.     Comments: Patient has 5 of 5 strength, neurovascular intact in the upper and lower extremities.  Skin:    General: Skin is warm and dry.  Neurological:     Mental Status: He is alert.     GCS: GCS eye subscore is 4. GCS verbal subscore is 5. GCS motor subscore is 6.     Cranial Nerves: No facial asymmetry.     Sensory: No sensory deficit.     Motor: No weakness.     Coordination: Romberg sign negative. Finger-Nose-Finger Test normal.     Gait: Gait normal.     Comments: Cranial nerves II through XII are grossly intact patient is having no difficulty word finding,  Psychiatric:        Mood and Affect: Mood normal.    ED Results / Procedures / Treatments   Labs (all labs ordered are listed, but only abnormal results are displayed) Labs Reviewed  CBC WITH DIFFERENTIAL/PLATELET - Abnormal; Notable for the following components:      Result Value   WBC 11.1 (*)    RBC 4.13 (*)    MCV 104.6 (*)    MCH 35.4 (*)    All other components within normal limits  COMPREHENSIVE METABOLIC PANEL - Abnormal; Notable for the following components:   Calcium 8.5 (*)    All other components within normal limits  RESP PANEL BY RT-PCR (FLU A&B, COVID) ARPGX2  TROPONIN I (HIGH SENSITIVITY)    EKG None  Radiology DG Chest 1 View  Result Date: 04/27/2021 CLINICAL DATA:  Shortness of breath for 2 weeks EXAM: CHEST  1 VIEW COMPARISON:  08/29/2020 FINDINGS: Normal heart size post TAVR. Mediastinal contours and pulmonary vascularity normal. Calcified granuloma RIGHT mid lung. Emphysematous and bronchitic changes consistent with COPD. Chronic interstitial prominence at lung bases, stable. No acute infiltrate, pleural effusion, or pneumothorax. No acute osseous findings. IMPRESSION: Changes consistent with COPD and old granulomatous disease.  No acute abnormalities. Electronically Signed   By: Ulyses Southward M.D.   On: 04/27/2021 14:53   MR BRAIN WO CONTRAST  Result Date: 04/27/2021 CLINICAL DATA:   Vertigo EXAM: MRI HEAD WITHOUT CONTRAST TECHNIQUE: Multiplanar, multiecho pulse sequences of the brain and surrounding structures were obtained without intravenous contrast. COMPARISON:  08/29/2020 FINDINGS: Brain: There is no acute infarction or intracranial hemorrhage. There is no intracranial mass, mass effect, or edema. There is no hydrocephalus or extra-axial fluid collection. Ventricles and sulci are within normal limits in size and configuration. Patchy foci of T2 hyperintensity in the supratentorial white matter are nonspecific but may reflect mild chronic microvascular ischemic changes. Punctate focus of susceptibility in the inferior left temporal lobe probably reflects chronic microhemorrhage. Left cerebellar focus on the prior study is not seen probably due to lower sensitivity of technique. Vascular: Major vessel flow voids at the skull base are preserved. Skull and upper cervical spine: Normal marrow signal is preserved. Sinuses/Orbits: Minor mucosal thickening.  Orbits are unremarkable. Other: Sella is unremarkable.  Mastoid air cells are clear. IMPRESSION: No evidence of recent infarction, hemorrhage, or mass. Stable mild chronic microvascular ischemic changes. Electronically Signed   By: Guadlupe Spanish M.D.   On: 04/27/2021 14:32    Procedures Procedures   Medications Ordered in ED Medications  albuterol (PROVENTIL) (2.5 MG/3ML) 0.083% nebulizer solution 2.5 mg (has no administration in time range)  sodium chloride 0.9 % bolus 1,000 mL (0 mLs Intravenous Stopped 04/27/21 1533)  meclizine (ANTIVERT) tablet 25 mg (25 mg Oral Given 04/27/21 1455)    ED Course  I have reviewed the triage vital signs and the nursing notes.  Pertinent labs & imaging results that were available during my care of the patient were reviewed by me and considered in my medical decision making (see chart for details).    MDM Rules/Calculators/A&P                         Initial impression-patient presents with  dizziness and shortness of breath.  He is alert, does not appear in acute stress, vital signs reassuring.  I suspect patient suffering from a viral infection with peripheral vertigo but cannot exclude the possibility of a cerebellar infarct will order MRI brain without contrast for further eval.  Obtain basic lab work-up and reassess.  Work-up-CBC shows slight leukocytosis 11.1, CMP unremarkable, first troponin is 5, respiratory panel negative chest x-ray shows changes consistent with COPD and old granulosis, MRI brain shows no evidence of acute recent infarct hemorrhage or mass.  EKG sinus without signs of ischemia.  Reassessment-patient was reassessed after fluids, meclizine, albuterol, states he is feeling much better, has no complaints this time.  Patient was able to ambulate with out difficulty, did not become hypoxic.  Patient is agreeable for discharge at this time.  Rule out-low suspicion for CVA, intracranial head bleed, cranial mass as there is no neurodeficits present on exam, MRI brain negative for acute findings.  Patient's symptoms resolved after fluids and meclizine. I have low suspicion for ACS as history is atypical, EKG was sinus rhythm without signs of ischemia, first troponin is 5 will defer second troponin as patient been chest pain-free for greater than 12 hours, if the ACS was present would expect elevation at this time.  Low suspicion for PE as patient denies pleuritic chest pain, patient denies leg pain, no pedal edema noted on exam, patient was vital signs reassuring, presentation more consistent with COPD exacerbation.  Low suspicion for AAA or aortic dissection as history is atypical.  Low suspicion for systemic infection as patient is nontoxic-appearing, vital signs reassuring, no obvious source infection noted on exam.   Plan-  Dizziness since resolved-suspect vertigo with dehydration, will recommend fluid rehydration, provide patient with meclizine follow-up with PCP as  needed. Shortness of breath since resolved-suspect mild exacerbation of COPD, will provide patient with albuterol inhaler, short course of steroids follow-up with PCP as needed, will defer antibiotics as patient denies sputum production, no consolidation seen on chest x-ray.  Vital signs have remained stable, no indication for hospital admission.  Patient discussed with attending and they agreed with assessment and plan.  Patient given at home care as well strict return precautions.  Patient verbalized that they understood agreed to said plan.  Final Clinical Impression(s) / ED Diagnoses Final diagnoses:  Shortness of breath  Dizziness    Rx / DC Orders ED Discharge Orders          Ordered    predniSONE (DELTASONE) 10 MG tablet  Daily        04/27/21 1606    meclizine (ANTIVERT) 25 MG tablet  3 times daily PRN        04/27/21 1606             Carroll Sage, PA-C 04/27/21 1609    Carroll Sage, PA-C 04/27/21 1611    Vanetta Mulders, MD 04/29/21 [phone]

## 2021-04-27 NOTE — Discharge Instructions (Addendum)
I suspect your dizziness is from vertigo and dehydration, have given you a medication for dizziness please take as prescribed.  I also recommend staying hydrated this can also lead to dizziness.  Please follow-up with your PCP as needed.  Shortness of breath I suspect from COPD starting you on a short course of steroids please take as prescribed.  Please use the albuterol inhaler as needed for shortness of breath may be 1 to 2 puffs every 4-6 hours.  Please follow-up your PCP for further evaluation.  Come back to the emergency department if you develop chest pain, shortness of breath, severe abdominal pain, uncontrolled nausea, vomiting, diarrhea.

## 2022-01-23 ENCOUNTER — Ambulatory Visit: Payer: Medicare Other | Admitting: Cardiology

## 2022-02-05 ENCOUNTER — Other Ambulatory Visit: Payer: Self-pay | Admitting: Family Medicine

## 2022-02-05 ENCOUNTER — Emergency Department (HOSPITAL_COMMUNITY): Payer: Medicare HMO

## 2022-02-05 ENCOUNTER — Inpatient Hospital Stay (HOSPITAL_COMMUNITY)
Admission: EM | Admit: 2022-02-05 | Discharge: 2022-02-07 | DRG: 269 | Disposition: A | Discharge status: Home / Self Care | Payer: Medicare HMO | Attending: Surgery | Admitting: Surgery

## 2022-02-05 ENCOUNTER — Other Ambulatory Visit (HOSPITAL_COMMUNITY): Payer: Self-pay | Admitting: Family Medicine

## 2022-02-05 ENCOUNTER — Emergency Department (HOSPITAL_COMMUNITY)
Admission: RE | Admit: 2022-02-05 | Discharge: 2022-02-05 | Disposition: A | Discharge status: Home / Self Care | Payer: Medicare HMO | Source: Ambulatory Visit | Attending: Family Medicine | Admitting: Family Medicine

## 2022-02-05 DIAGNOSIS — F17210 Nicotine dependence, cigarettes, uncomplicated: Secondary | ICD-10-CM | POA: Diagnosis present

## 2022-02-05 DIAGNOSIS — I2510 Atherosclerotic heart disease of native coronary artery without angina pectoris: Secondary | ICD-10-CM | POA: Diagnosis present

## 2022-02-05 DIAGNOSIS — I7143 Infrarenal abdominal aortic aneurysm, without rupture: Secondary | ICD-10-CM

## 2022-02-05 DIAGNOSIS — Z7982 Long term (current) use of aspirin: Secondary | ICD-10-CM

## 2022-02-05 DIAGNOSIS — Z8249 Family history of ischemic heart disease and other diseases of the circulatory system: Secondary | ICD-10-CM

## 2022-02-05 DIAGNOSIS — R1033 Periumbilical pain: Principal | ICD-10-CM

## 2022-02-05 DIAGNOSIS — R109 Unspecified abdominal pain: Secondary | ICD-10-CM

## 2022-02-05 DIAGNOSIS — I714 Abdominal aortic aneurysm, without rupture, unspecified: Secondary | ICD-10-CM | POA: Diagnosis not present

## 2022-02-05 DIAGNOSIS — Z79899 Other long term (current) drug therapy: Secondary | ICD-10-CM

## 2022-02-05 DIAGNOSIS — J439 Emphysema, unspecified: Secondary | ICD-10-CM | POA: Diagnosis present

## 2022-02-05 DIAGNOSIS — I252 Old myocardial infarction: Secondary | ICD-10-CM

## 2022-02-05 DIAGNOSIS — Z951 Presence of aortocoronary bypass graft: Secondary | ICD-10-CM

## 2022-02-05 DIAGNOSIS — F419 Anxiety disorder, unspecified: Secondary | ICD-10-CM | POA: Diagnosis present

## 2022-02-05 LAB — COMPREHENSIVE METABOLIC PANEL
ALT: 12 U/L (ref 0–44)
AST: 25 U/L (ref 15–41)
Albumin: 3.8 g/dL (ref 3.5–5.0)
Alkaline Phosphatase: 58 U/L (ref 38–126)
Anion gap: 6 (ref 5–15)
BUN: 11 mg/dL (ref 8–23)
CO2: 30 mmol/L (ref 22–32)
Calcium: 9.5 mg/dL (ref 8.9–10.3)
Chloride: 103 mmol/L (ref 98–111)
Creatinine, Ser: 1 mg/dL (ref 0.61–1.24)
GFR, Estimated: >60 mL/min (ref >60)
Glucose, Bld: 120 mg/dL — ABNORMAL HIGH (ref 70–99)
Potassium: 3.9 mmol/L (ref 3.5–5.1)
Sodium: 139 mmol/L (ref 135–145)
Total Bilirubin: 0.4 mg/dL (ref 0.3–1.2)
Total Protein: 7 g/dL (ref 6.5–8.1)

## 2022-02-05 LAB — URINALYSIS, ROUTINE W REFLEX MICROSCOPIC
Bilirubin Urine: NEGATIVE
Glucose, UA: NEGATIVE mg/dL
Hgb urine dipstick: NEGATIVE
Ketones, ur: NEGATIVE mg/dL
Leukocytes,Ua: NEGATIVE
Nitrite: NEGATIVE
Protein, ur: NEGATIVE mg/dL
Specific Gravity, Urine: 1.008 (ref 1.005–1.030)
pH: 9 — ABNORMAL HIGH (ref 5.0–8.0)

## 2022-02-05 LAB — CBC WITH DIFFERENTIAL/PLATELET
Abs Immature Granulocytes: 0.01 10*3/uL (ref 0.00–0.07)
Basophils Absolute: 0.1 10*3/uL (ref 0.0–0.1)
Basophils Relative: 1 %
Eosinophils Absolute: 0.1 10*3/uL (ref 0.0–0.5)
Eosinophils Relative: 1 %
HCT: 42.1 % (ref 39.0–52.0)
Hemoglobin: 13.8 g/dL (ref 13.0–17.0)
Immature Granulocytes: 0 %
Lymphocytes Relative: 28 %
Lymphs Abs: 2.2 10*3/uL (ref 0.7–4.0)
MCH: 34.8 pg — ABNORMAL HIGH (ref 26.0–34.0)
MCHC: 32.8 g/dL (ref 30.0–36.0)
MCV: 106 fL — ABNORMAL HIGH (ref 80.0–100.0)
Monocytes Absolute: 0.7 10*3/uL (ref 0.1–1.0)
Monocytes Relative: 9 %
Neutro Abs: 4.9 10*3/uL (ref 1.7–7.7)
Neutrophils Relative %: 61 %
Platelets: 160 10*3/uL (ref 150–400)
RBC: 3.97 MIL/uL — ABNORMAL LOW (ref 4.22–5.81)
RDW: 13.8 % (ref 11.5–15.5)
WBC: 7.9 10*3/uL (ref 4.0–10.5)
nRBC: 0 % (ref 0.0–0.2)

## 2022-02-05 LAB — LIPASE, BLOOD: Lipase: 53 U/L — ABNORMAL HIGH (ref 11–51)

## 2022-02-05 IMAGING — CT CT ANGIO CHEST-ABD-PELV FOR DISSECTION W/ AND WO/W CM
2 of 7 series · 12 of 46 positions shown, 14 images · non-contrast
Comparison: CT abdomen and pelvis [DATE]. CT of the chest
[DATE]. CT chest [DATE].

CLINICAL DATA: Abdominal pain.

EXAM:
CT ANGIOGRAPHY CHEST, ABDOMEN AND PELVIS
TECHNIQUE: CT abdomen and pelvis [DATE]

[Series 7: dissection 3.0 i30f 3 · axial · 0.81mm/px · z∈[+794,+1336]mm · 9 of 207 slices shown, 11 images]
[im 13/207  soft-tissue]
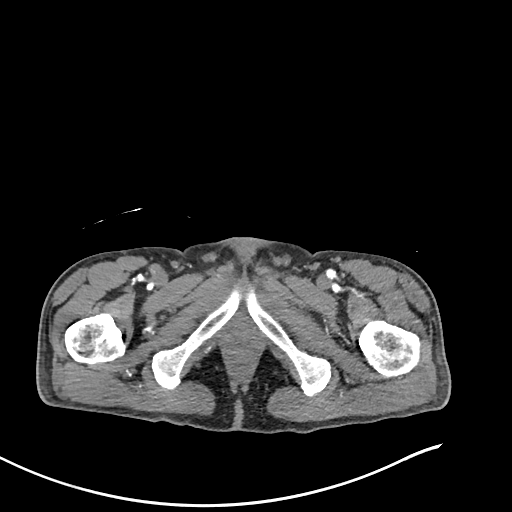
[im 13/207  bone]
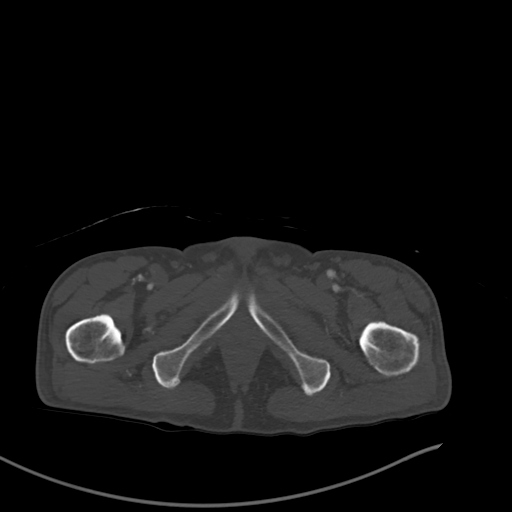
[im 37/207  soft-tissue]
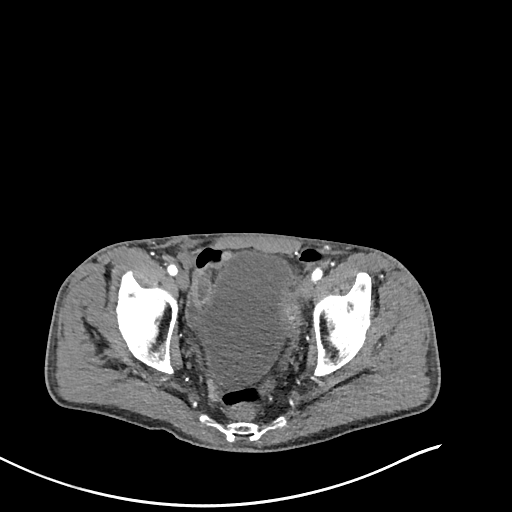
[im 61/207  soft-tissue]
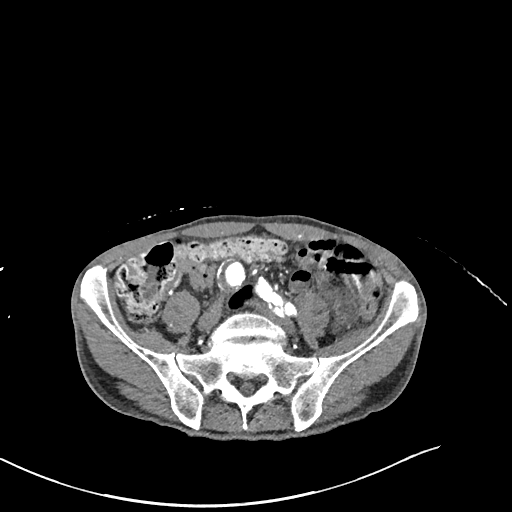
[im 85/207  soft-tissue]
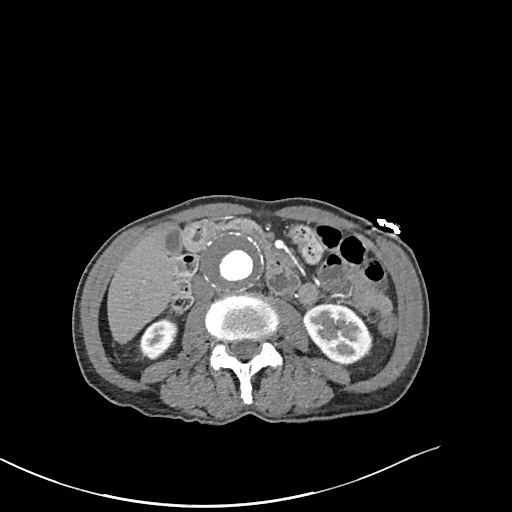
[im 110/207  soft-tissue]
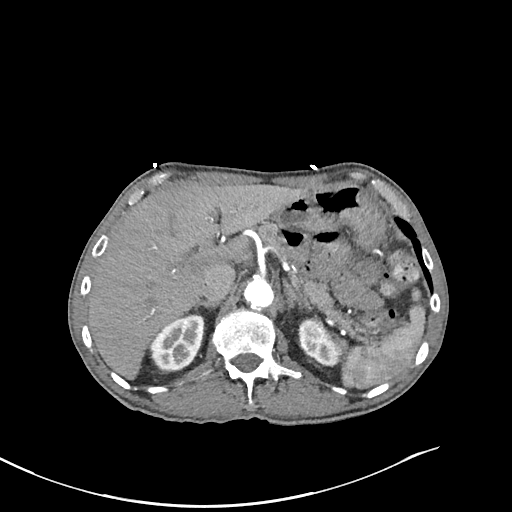
[im 122/207  soft-tissue]
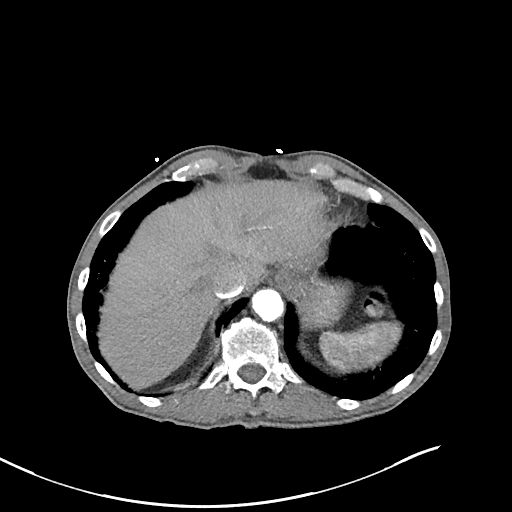
[im 146/207  soft-tissue]
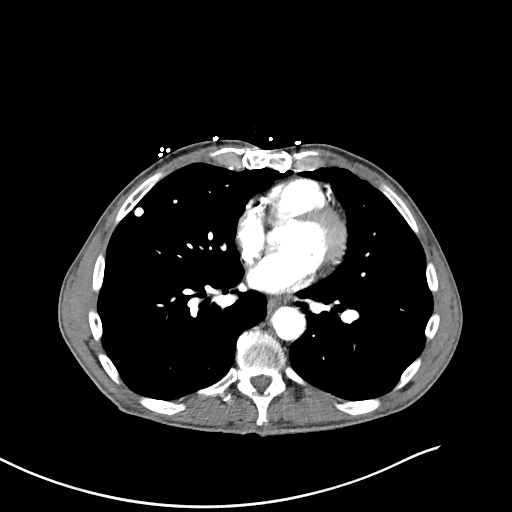
[im 170/207  soft-tissue]
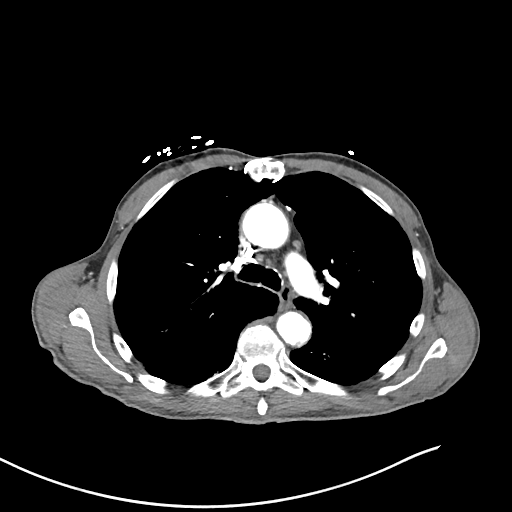
[im 194/207  soft-tissue]
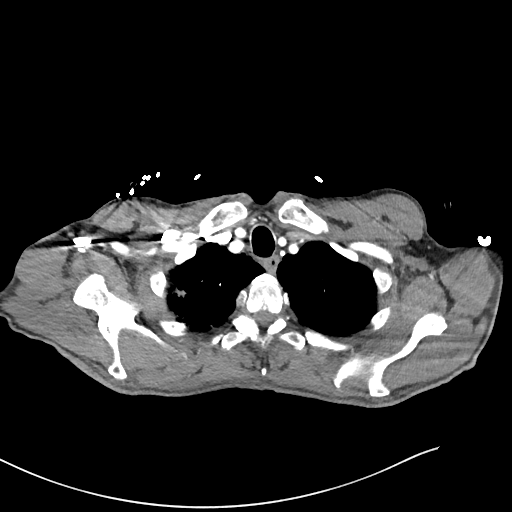
[im 194/207  bone]
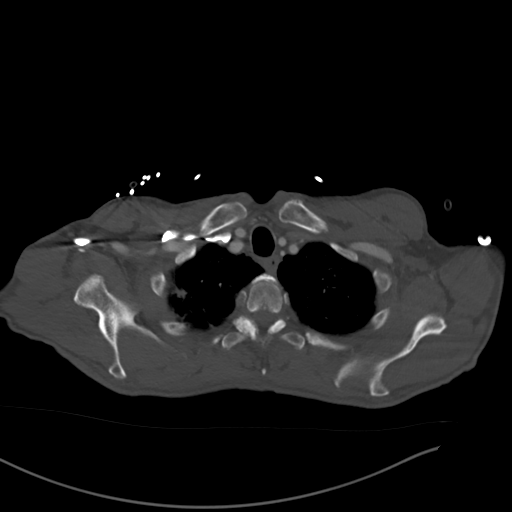

[Series 10: coronals · coronal · 0.67mm/px · 3 of 130 slices shown]
[im 33/130  soft-tissue]
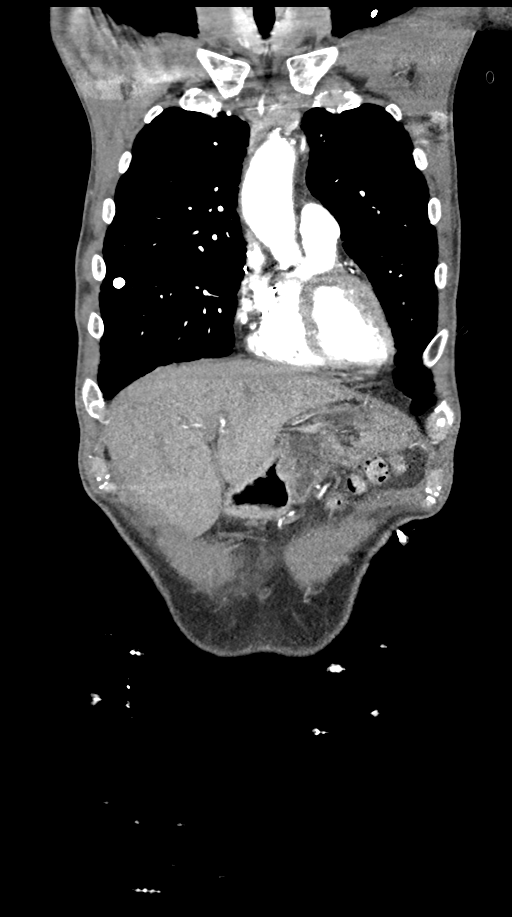
[im 65/130  soft-tissue]
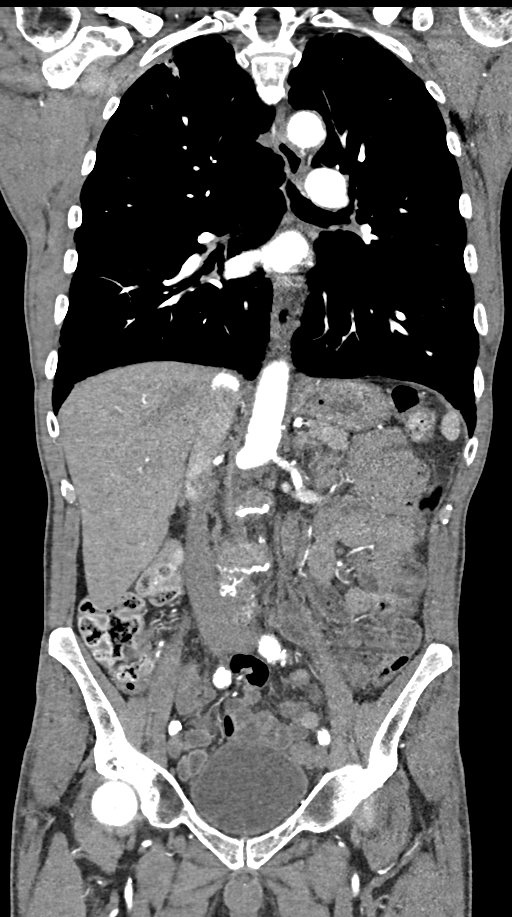
[im 97/130  soft-tissue]
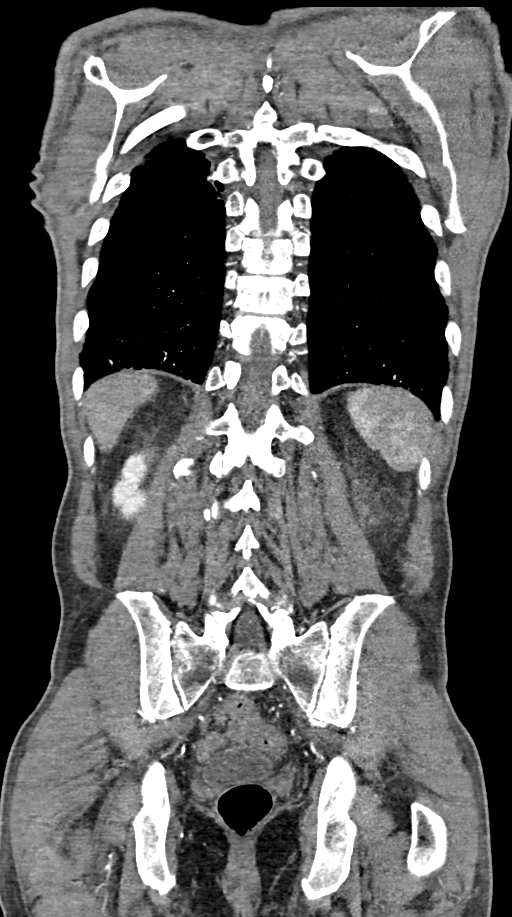

[12 of 46 positions shown; findings below may reference images not displayed]

Multidetector CT imaging through the chest, abdomen and pelvis was
performed using the standard protocol during bolus administration of
intravenous contrast. Multiplanar reconstructed images and MIPs were
obtained and reviewed to evaluate the vascular anatomy.

RADIATION DOSE REDUCTION: This exam was performed according to the
departmental dose-optimization program which includes automated
exposure control, adjustment of the mA and/or kV according to
patient size and/or use of iterative reconstruction technique.

CONTRAST:  100mL OMNIPAQUE IOHEXOL 350 MG/ML SOLN
FINDINGS: CTA CHEST FINDINGS

Cardiovascular: Preferential opacification of the thoracic aorta. No
evidence of thoracic aortic aneurysm or dissection. Normal heart
size. No pericardial effusion. Patient is status post cardiac
surgery.

Mediastinum/Nodes: No enlarged mediastinal, hilar, or axillary lymph
nodes. Thyroid gland, trachea, and esophagus demonstrate no
significant findings.

Lungs/Pleura: Severe emphysematous changes are again seen. There is
a spiculated nodule in the right lung apex measuring 1.3 x 0.6 cm
which has mildly decreased in size. There is a rounded nodule in the
right lower lobe measuring 9 mm which has not significantly changed
since [9M]. Calcified granuloma in the right middle lobe is stable.
No focal lung infiltrate, pleural effusion or pneumothorax
identified.

Musculoskeletal: Sternotomy wires are present.  No acute fractures.

Review of the MIP images confirms the above findings.

CTA ABDOMEN AND PELVIS FINDINGS

VASCULAR

Aorta: Again seen is an infrarenal abdominal aortic aneurysm. This
measures 5.2 x 5.4 by 8.7 cm in largest AP, transverse and
craniocaudad dimensions respectively. This has mildly increased in
size. There is a large amount of noncalcified thrombus within the
aneurysm sac similar to the prior study. There is also calcified
atherosclerotic disease.

Celiac: Patent without evidence of aneurysm, dissection, vasculitis
or significant stenosis.

SMA: Patent without evidence of aneurysm, dissection, vasculitis or
significant stenosis.

Renals: Moderate severe focal stenosis at the origin of the right
renal artery. Left renal artery within normal limits. No significant
interval change.

IMA: Not seen.

Inflow: There is aneurysmal dilatation of the right common iliac
artery measuring 2.2 cm. This contains noncalcified thrombus and has
mildly increased in size compared to prior.

There is also stable aneurysmal dilatation of the left common iliac
artery measuring 1.6 cm with moderate severe focal stenosis at
internal/external iliac artery junction. These findings are similar
to prior. External iliac arteries are grossly patent and contains
calcified atherosclerotic disease. There is occlusion of the
visualized proximal right femoral artery, a new finding.

Veins: No obvious venous abnormality within the limitations of this
arterial phase study.

Review of the MIP images confirms the above findings.

NON-VASCULAR

Hepatobiliary: No focal liver abnormality is seen. No gallstones,
gallbladder wall thickening, or biliary dilatation.

Pancreas: Unremarkable. No pancreatic ductal dilatation or
surrounding inflammatory changes.

Spleen: Normal in size without focal abnormality.

Adrenals/Urinary Tract: Adrenal glands are unremarkable. Kidneys are
normal, without renal calculi, focal lesion, or hydronephrosis.
Bladder is unremarkable.

Stomach/Bowel: Stomach is within normal limits. Appendix is not
seen. No evidence of bowel wall thickening, distention, or
inflammatory changes.

Lymphatic: No enlarged lymph nodes are seen.

Reproductive: Prostate is unremarkable.

Other: No abdominal wall hernia or abnormality. No abdominopelvic
ascites.

Musculoskeletal: Bilateral pars interarticularis defects at L5 with
grade 1 anterolisthesis. No acute fractures are identified.

Review of the MIP images confirms the above findings.
IMPRESSION: 1. 5.4 cm infrarenal abdominal aortic aneurysm has increased in
size. Recommend follow-up CT/MR every 6 months and vascular
consultation. This recommendation follows ACR consensus guidelines:
White Paper of the ACR Incidental Findings Committee II on Vascular
Findings. [HOSPITAL] [9M]; [DATE].
2. New thrombosis within visualized right femoral artery.
3. Stable aneurysmal dilatation of bilateral common iliac arteries.
4. Stenosis at origin of right renal artery.
5. No acute cardiopulmonary process.
6. 1.3 cm spiculated nodule in the right lung apex remains
suspicious, but has mildly decreased in size. Continued follow-up
recommended in 3 months to re-evaluate. PET-CT should be considered.
7. Aortic Atherosclerosis ([9M]-[9M]) and Emphysema ([9M]-[9M]).

## 2022-02-05 MED ORDER — IOHEXOL 350 MG/ML SOLN
100.0000 mL | Freq: Once | INTRAVENOUS | Status: AC | PRN
  Administered 2022-02-05: 100 mL via INTRAVENOUS

## 2022-02-05 NOTE — ED Triage Notes (Addendum)
Pt. 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 This started the pain last Wednesday. ?Dr. Lonni Fix sent me here from [facility] ?

## 2022-02-05 NOTE — ED Notes (Signed)
Unable to find pt in lobby. 

## 2022-02-05 NOTE — ED Provider Notes (Signed)
?[facility] EMERGENCY DEPARTMENT ?Provider Note ? ? ?CSN: [id number] ?Arrival date & time: 02/05/22  1321 ? ?  ? ?History ? ?Chief Complaint  ?Patient presents with  ? Abdominal Pain  ? ? ?[name] is a 71 y.o. male. ? ?Patient with history of CABG, AAA, COPD presents today with complaints of abdominal pain. He states that same has been ongoing for the past week and a half and has been worsening. He describes the pain as burning in nature, located to the left side of his umbilicus and does not radiate.  He states that the pain is constant. He also endorses associated nausea that started yesterday without vomiting. Does endorses intermittent episodes of diarrhea for the past several weeks which he believes is unrelated to his current pain. He was seen by his PCP Dr. Lonni Fix who sent him here with reported concerns that he could have a dissection. Patient reports he believes this is the etiology of his symptoms as 'the vascular surgery team here in [place] told me if I ever had burning pain in this location it was probably time for my aneurysm to be removed.' He denies any fevers, chills, chest pain, shortness of breath. ? ?The history is provided by the patient. No language interpreter was used.  ?Abdominal Pain ?Associated symptoms: nausea   ? ?  ? ?Home Medications ?Prior to Admission medications   ?Medication Sig Start Date End Date Taking? Authorizing Provider  ?albuterol (VENTOLIN HFA) 108 (90 Base) MCG/ACT inhaler Inhale 1-2 puffs into the lungs every 6 (six) hours as needed for wheezing or shortness of breath. 08/29/20   Jeannie Fend, PA-C  ?ALPRAZolam (XANAX) 1 MG tablet Take 1 mg by mouth 4 (four) times daily as needed for anxiety.  09/17/16   [provider]  ?aspirin EC 81 MG tablet Take 81 mg by mouth daily.     [provider]  ?carbamazepine (TEGRETOL XR) 200 MG 12 hr tablet Take 400 mg by mouth 2 (two) times daily. Start with 1 tablet twice a week, then  increase to 2 tablets twice daily 08/24/20   [provider]  ?HYDROcodone-acetaminophen (NORCO) 7.5-325 MG tablet Take 1 tablet by mouth every 6 (six) hours as needed for moderate pain.  08/16/16   [provider]  ?meclizine (ANTIVERT) 25 MG tablet Take 1 tablet (25 mg total) by mouth 3 (three) times daily as needed for dizziness. 04/27/21   Carroll Sage, PA-C  ?nitroGLYCERIN (NITROSTAT) 0.4 MG SL tablet PLACE 1 TABLET UNDER THE TONGUE EVERY 5 MINUTES AS NEEDED FOR CHEST PAIN 02/11/18   Jodelle Gross, NP  ?omeprazole (PRILOSEC) 40 MG capsule Take 40 mg by mouth daily.  08/13/20   [provider]  ?pantoprazole (PROTONIX) 40 MG tablet Take 1 tablet (40 mg total) by mouth 2 (two) times daily before a meal. 08/14/18 09/11/20  Johnson, Clanford L, MD  ?zolpidem (AMBIEN) 10 MG tablet Take 10 mg by mouth at bedtime as needed for sleep.  07/27/18   [provider]  ?   ? ?Allergies    ?Patient has no known allergies.   ? ?Review of Systems   ?Review of Systems  ?Gastrointestinal:  Positive for abdominal pain and nausea.  ?All other systems reviewed and are negative. ? ?Physical Exam ?Updated Vital Signs ?BP 132/62   Pulse (!) 56   Temp 98.6 ?F (37 ?C) (Oral)   Resp 16   SpO2 96%  ?Physical Exam ?Vitals and nursing  note reviewed.  ?Constitutional:   ?   General: He is not in acute distress. ?   Appearance: Normal appearance. He is well-developed and normal weight. He is not ill-appearing, toxic-appearing or diaphoretic.  ?   Comments: Patient resting comfortably in bed in no acute distress  ?HENT:  ?   Head: Normocephalic and atraumatic.  ?Cardiovascular:  ?   Rate and Rhythm: Normal rate.  ?Pulmonary:  ?   Effort: Pulmonary effort is normal. No respiratory distress.  ?Abdominal:  ?   General: Abdomen is flat.  ?   Palpations: Abdomen is soft.  ?   Tenderness: There is no guarding. Negative signs include Murphy's sign and McBurney's sign.  ?   Comments: Tenderness to  palpation to the left of the umbilicus. AAA pulsating and tender. No abdominal distention present.  ?Musculoskeletal:     ?   General: Normal range of motion.  ?   Cervical back: Normal range of motion.  ?Skin: ?   General: Skin is warm and dry.  ?Neurological:  ?   General: No focal deficit present.  ?   Mental Status: He is alert.  ?Psychiatric:     ?   Mood and Affect: Mood normal.     ?   Behavior: Behavior normal.  ? ? ?ED Results / Procedures / Treatments   ?Labs ?(all labs ordered are listed, but only abnormal results are displayed) ?Labs Reviewed  ?CBC WITH DIFFERENTIAL/PLATELET - Abnormal; Notable for the following components:  ?    Result Value  ? RBC 3.97 (*)   ? MCV 106.0 (*)   ? MCH 34.8 (*)   ? All other components within normal limits  ?COMPREHENSIVE METABOLIC PANEL - Abnormal; Notable for the following components:  ? Glucose, Bld 120 (*)   ? All other components within normal limits  ?LIPASE, BLOOD - Abnormal; Notable for the following components:  ? Lipase 53 (*)   ? All other components within normal limits  ?URINALYSIS, ROUTINE W REFLEX MICROSCOPIC  ? ? ?EKG ?None ? ?Radiology ?US Abdomen Complete ? ?Result Date: 02/05/2022 ?CLINICAL DATA:  Upper abdominal pain for 1 week.  AAA EXAM: ABDOMEN ULTRASOUND COMPLETE COMPARISON:  CT 09/10/2020 FINDINGS: Gallbladder: Gallbladder is partially contracted, as patient was not NPO for exam. No gallstones or biliary sludge visualized. No sonographic Murphy sign noted by sonographer. Common bile duct: Diameter: 4 mm. Liver: No focal lesion identified. Within normal limits in parenchymal echogenicity. Portal vein is patent on color Doppler imaging with normal direction of blood flow towards the liver. IVC: No abnormality visualized. Pancreas: Visualized portion unremarkable. Spleen: Size and appearance within normal limits. Right Kidney: Length: 9.1 cm. Echogenicity within normal limits. No mass or hydronephrosis visualized. Left Kidney: Length: 10.5 cm.  Echogenicity within normal limits. No mass or hydronephrosis visualized. Abdominal aorta: Proximal abdominal aorta measures 3.2 cm in diameter. Mid abdominal aorta measures 2.8 cm in diameter. Distal abdominal aorta measures up to 5.5 cm in diameter with significant mural thrombus. Aneurysm sac measures approximately 7 cm in length. Aorta is patent. Right common iliac artery measures 2.2 cm in diameter. Left common iliac artery measures 2.2 cm in diameter. Other findings: None. IMPRESSION: 1. Abdominal aortic aneurysm measuring up to 5.5 cm in diameter (previously 5.0 cm in diameter in 2021). Given interval growth, evaluation by vascular surgery is recommended. 2. Bilateral common iliac artery aneurysms, each measuring 2.2 cm in diameter. 3. Remainder of the examination is within normal limits. These results will be  called to the ordering clinician or representative by the Radiologist Assistant, and communication documented in the PACS or [employer] Dashboard. Electronically Signed   By: Duanne GuessNicholas  Plundo D.O.   On: 02/05/2022 11:43  ? ?CT Angio Chest/Abd/Pel for Dissection W and/or Wo Contrast ? ?Result Date: 02/05/2022 ?CLINICAL DATA:  Abdominal pain. EXAM: CT ANGIOGRAPHY CHEST, ABDOMEN AND PELVIS TECHNIQUE: CT abdomen and pelvis 09/10/2020 Multidetector CT imaging through the chest, abdomen and pelvis was performed using the standard protocol during bolus administration of intravenous contrast. Multiplanar reconstructed images and MIPs were obtained and reviewed to evaluate the vascular anatomy. RADIATION DOSE REDUCTION: This exam was performed according to the departmental dose-optimization program which includes automated exposure control, adjustment of the mA and/or kV according to patient size and/or use of iterative reconstruction technique. CONTRAST:  100mL OMNIPAQUE IOHEXOL 350 MG/ML SOLN COMPARISON:  CT abdomen and pelvis 09/10/2020. CT of the chest 05/25/2019. CT chest 05/23/2017. FINDINGS: CTA CHEST FINDINGS  Cardiovascular: Preferential opacification of the thoracic aorta. No evidence of thoracic aortic aneurysm or dissection. Normal heart size. No pericardial effusion. Patient is status post cardiac surgery. Mediastinum/Nodes:

## 2022-02-05 NOTE — ED Provider Triage Note (Signed)
Emergency Medicine Provider Triage Evaluation Note ? ?[name] , a 71 y.o. male  was evaluated in triage.  Pt complains of increasing abdominal pain over the last 5 days.  Nausea started yesterday without any vomiting.  Abdominal pain localized left of center near the umbilicus.  Pain described as sharp, waxing and waning, but increasing over the past few days.  Also endorses diarrhea over the last 2 to 3 weeks, believes its not related.  Known AAA, was sent from [facility] by Dr. Teressa Senter to get this evaluated.  Patient believes this is the cause of his abdominal pain.  Hx of CAD, AAA, chronic tobacco use, COPD, anxiety, GERD, CABG. denies recent upper respiratory infection, fever, dizziness or lightheadedness.  Denies weakness, numbness or tingling to the extremities.  No urinary symptoms. ? ?Review of Systems  ?Positive: As above ?Negative: As above ? ?Physical Exam  ?BP (!) 152/80 (BP Location: Right Arm)   Pulse 72   Temp 98.6 ?F (37 ?C) (Oral)   Resp 16   SpO2 91%  ?Gen:   Awake, no distress   ?Resp:  Normal effort, CTAB ?MSK:   Moves extremities without difficulty  ?Other:  Localized tenderness to a small area left of the umbilicus about mid abdomen.  No flank tenderness.  Pulses 2+ of the upper and lower extremities bilaterally. ? ?Medical Decision Making  ?Medically screening exam initiated at 3:13 PM.  Appropriate orders placed.  THONG RASPA was informed that the remainder of the evaluation will be completed by another provider, this initial triage assessment does not replace that evaluation, and the importance of remaining in the ED until their evaluation is complete. ? ?Labs, imaging, EKG ordered ?  ?Prince Rome, PA-C ?[id number] ? ?

## 2022-02-06 ENCOUNTER — Inpatient Hospital Stay (HOSPITAL_COMMUNITY): Payer: Medicare HMO

## 2022-02-06 ENCOUNTER — Encounter (HOSPITAL_COMMUNITY)
Admission: EM | Disposition: A | Discharge status: Home / Self Care | Payer: Self-pay | Source: Home / Self Care | Attending: Surgery

## 2022-02-06 ENCOUNTER — Encounter (HOSPITAL_COMMUNITY): Payer: Self-pay | Admitting: Surgery

## 2022-02-06 ENCOUNTER — Inpatient Hospital Stay (HOSPITAL_COMMUNITY): Payer: Medicare HMO | Admitting: Anesthesiology

## 2022-02-06 ENCOUNTER — Other Ambulatory Visit: Payer: Self-pay

## 2022-02-06 DIAGNOSIS — I252 Old myocardial infarction: Secondary | ICD-10-CM

## 2022-02-06 DIAGNOSIS — F17210 Nicotine dependence, cigarettes, uncomplicated: Secondary | ICD-10-CM | POA: Diagnosis present

## 2022-02-06 DIAGNOSIS — J449 Chronic obstructive pulmonary disease, unspecified: Secondary | ICD-10-CM | POA: Diagnosis not present

## 2022-02-06 DIAGNOSIS — I714 Abdominal aortic aneurysm, without rupture, unspecified: Secondary | ICD-10-CM | POA: Diagnosis present

## 2022-02-06 DIAGNOSIS — I2510 Atherosclerotic heart disease of native coronary artery without angina pectoris: Secondary | ICD-10-CM | POA: Diagnosis present

## 2022-02-06 DIAGNOSIS — Z7982 Long term (current) use of aspirin: Secondary | ICD-10-CM | POA: Diagnosis not present

## 2022-02-06 DIAGNOSIS — Z8249 Family history of ischemic heart disease and other diseases of the circulatory system: Secondary | ICD-10-CM | POA: Diagnosis not present

## 2022-02-06 DIAGNOSIS — Z951 Presence of aortocoronary bypass graft: Secondary | ICD-10-CM | POA: Diagnosis not present

## 2022-02-06 DIAGNOSIS — F419 Anxiety disorder, unspecified: Secondary | ICD-10-CM | POA: Diagnosis present

## 2022-02-06 DIAGNOSIS — I7143 Infrarenal abdominal aortic aneurysm, without rupture: Secondary | ICD-10-CM | POA: Diagnosis present

## 2022-02-06 DIAGNOSIS — J439 Emphysema, unspecified: Secondary | ICD-10-CM | POA: Diagnosis present

## 2022-02-06 DIAGNOSIS — Z79899 Other long term (current) drug therapy: Secondary | ICD-10-CM | POA: Diagnosis not present

## 2022-02-06 HISTORY — PX: ULTRASOUND GUIDANCE FOR VASCULAR ACCESS: SHX6516

## 2022-02-06 HISTORY — PX: ABDOMINAL AORTIC ENDOVASCULAR STENT GRAFT: SHX5707

## 2022-02-06 LAB — CBC
HCT: 42.2 % (ref 39.0–52.0)
Hemoglobin: 14.5 g/dL (ref 13.0–17.0)
MCH: 35.5 pg — ABNORMAL HIGH (ref 26.0–34.0)
MCHC: 34.4 g/dL (ref 30.0–36.0)
MCV: 103.2 fL — ABNORMAL HIGH (ref 80.0–100.0)
Platelets: 152 10*3/uL (ref 150–400)
RBC: 4.09 MIL/uL — ABNORMAL LOW (ref 4.22–5.81)
RDW: 13.7 % (ref 11.5–15.5)
WBC: 10.6 10*3/uL — ABNORMAL HIGH (ref 4.0–10.5)
nRBC: 0 % (ref 0.0–0.2)

## 2022-02-06 LAB — PROTIME-INR
INR: 1 (ref 0.8–1.2)
INR: 1.1 (ref 0.8–1.2)
Prothrombin Time: 13.5 seconds (ref 11.4–15.2)
Prothrombin Time: 14.3 seconds (ref 11.4–15.2)

## 2022-02-06 LAB — MAGNESIUM: Magnesium: 2.2 mg/dL (ref 1.7–2.4)

## 2022-02-06 LAB — BASIC METABOLIC PANEL
Anion gap: 9 (ref 5–15)
BUN: 10 mg/dL (ref 8–23)
CO2: 24 mmol/L (ref 22–32)
Calcium: 9.5 mg/dL (ref 8.9–10.3)
Chloride: 104 mmol/L (ref 98–111)
Creatinine, Ser: 0.92 mg/dL (ref 0.61–1.24)
GFR, Estimated: >60 mL/min (ref >60)
Glucose, Bld: 89 mg/dL (ref 70–99)
Potassium: 4.3 mmol/L (ref 3.5–5.1)
Sodium: 137 mmol/L (ref 135–145)

## 2022-02-06 LAB — ABO/RH: ABO/RH(D): O POS

## 2022-02-06 LAB — TYPE AND SCREEN
ABO/RH(D): O POS
Antibody Screen: NEGATIVE

## 2022-02-06 LAB — POCT ACTIVATED CLOTTING TIME: Activated Clotting Time: 245 seconds

## 2022-02-06 LAB — SURGICAL PCR SCREEN
MRSA, PCR: NEGATIVE
Staphylococcus aureus: NEGATIVE

## 2022-02-06 LAB — APTT: aPTT: 40 seconds — ABNORMAL HIGH (ref 24–36)

## 2022-02-06 IMAGING — DX DG CHEST 1V PORT
1 series · 2 of 2 positions shown · non-contrast
Comparison: Chest XR, most recently [DATE]. CTA CAP,
[DATE].

CLINICAL DATA: Post op aortic sx

EXAM:
PORTABLE CHEST 1 VIEW

[Series 1: chest · 0.14mm/px · 2 of 2 slices shown]
[im 1/2]
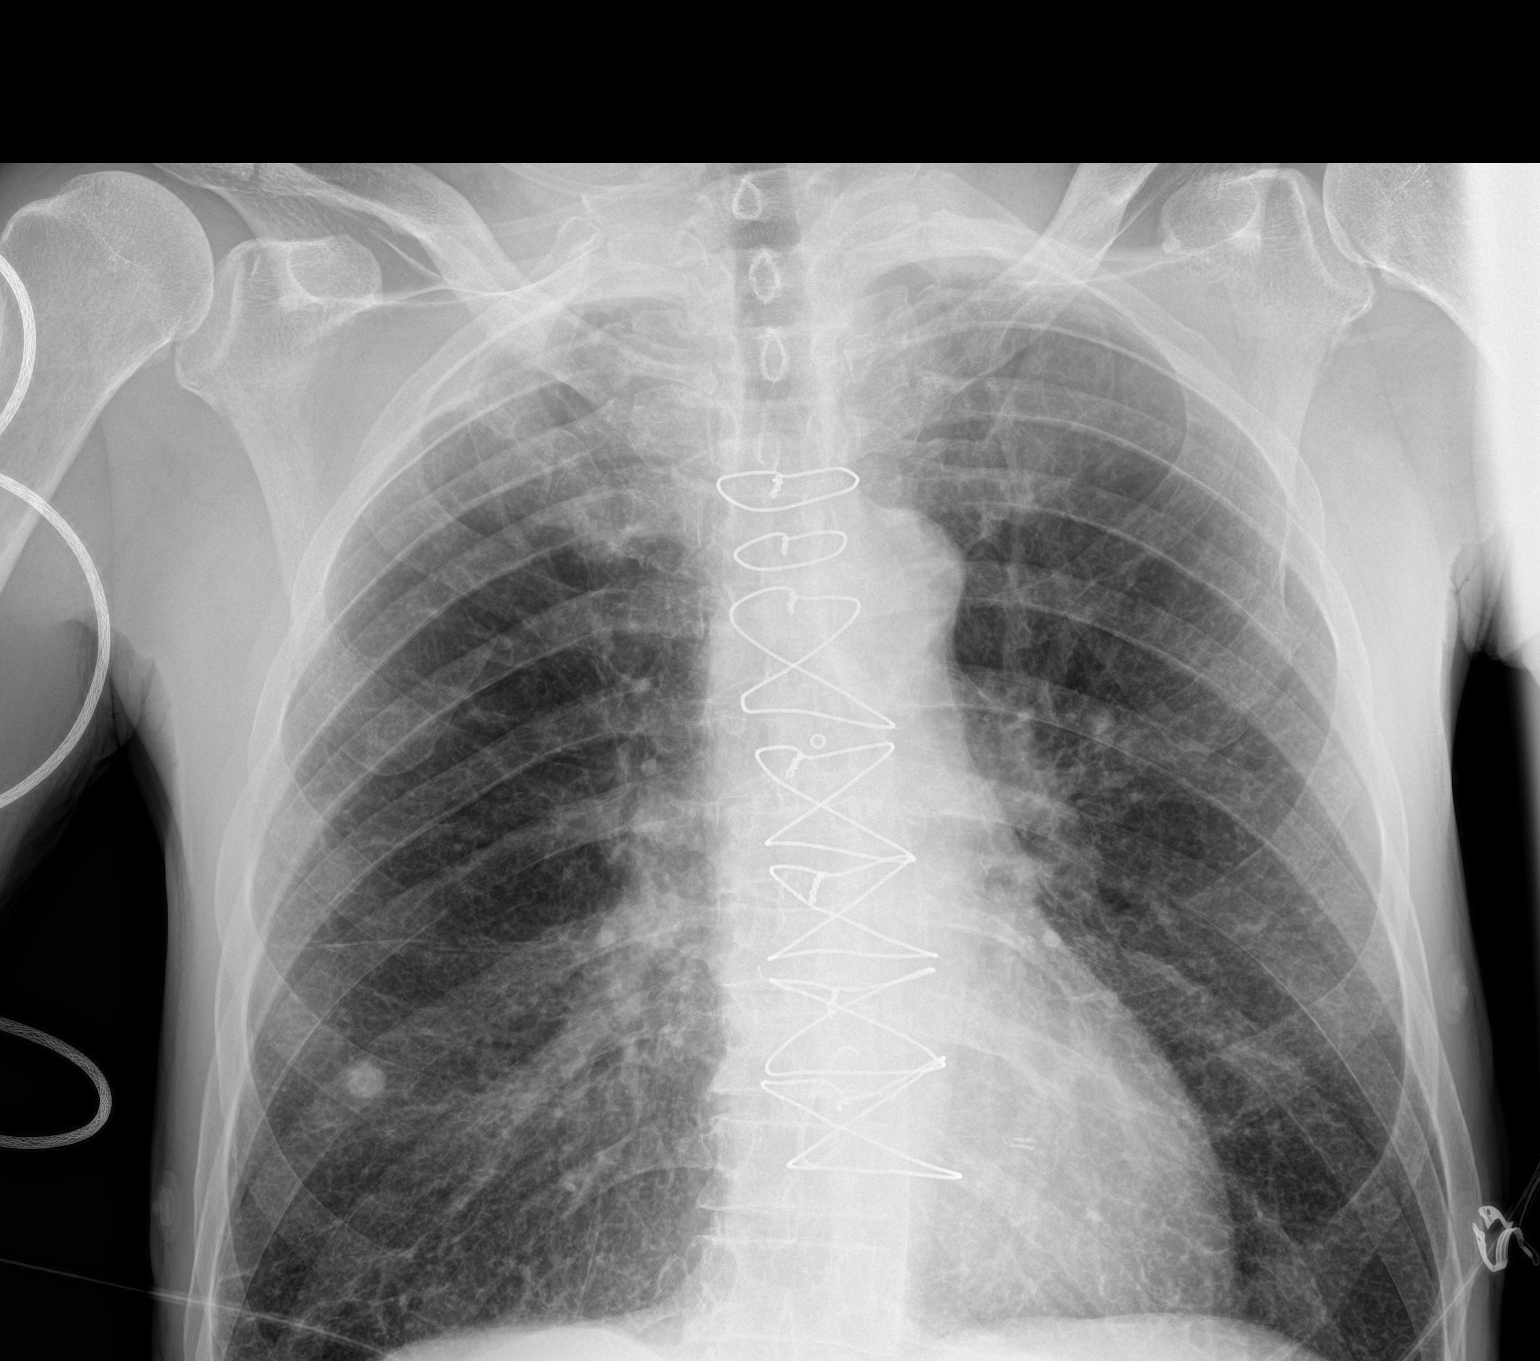
[im 2/2]
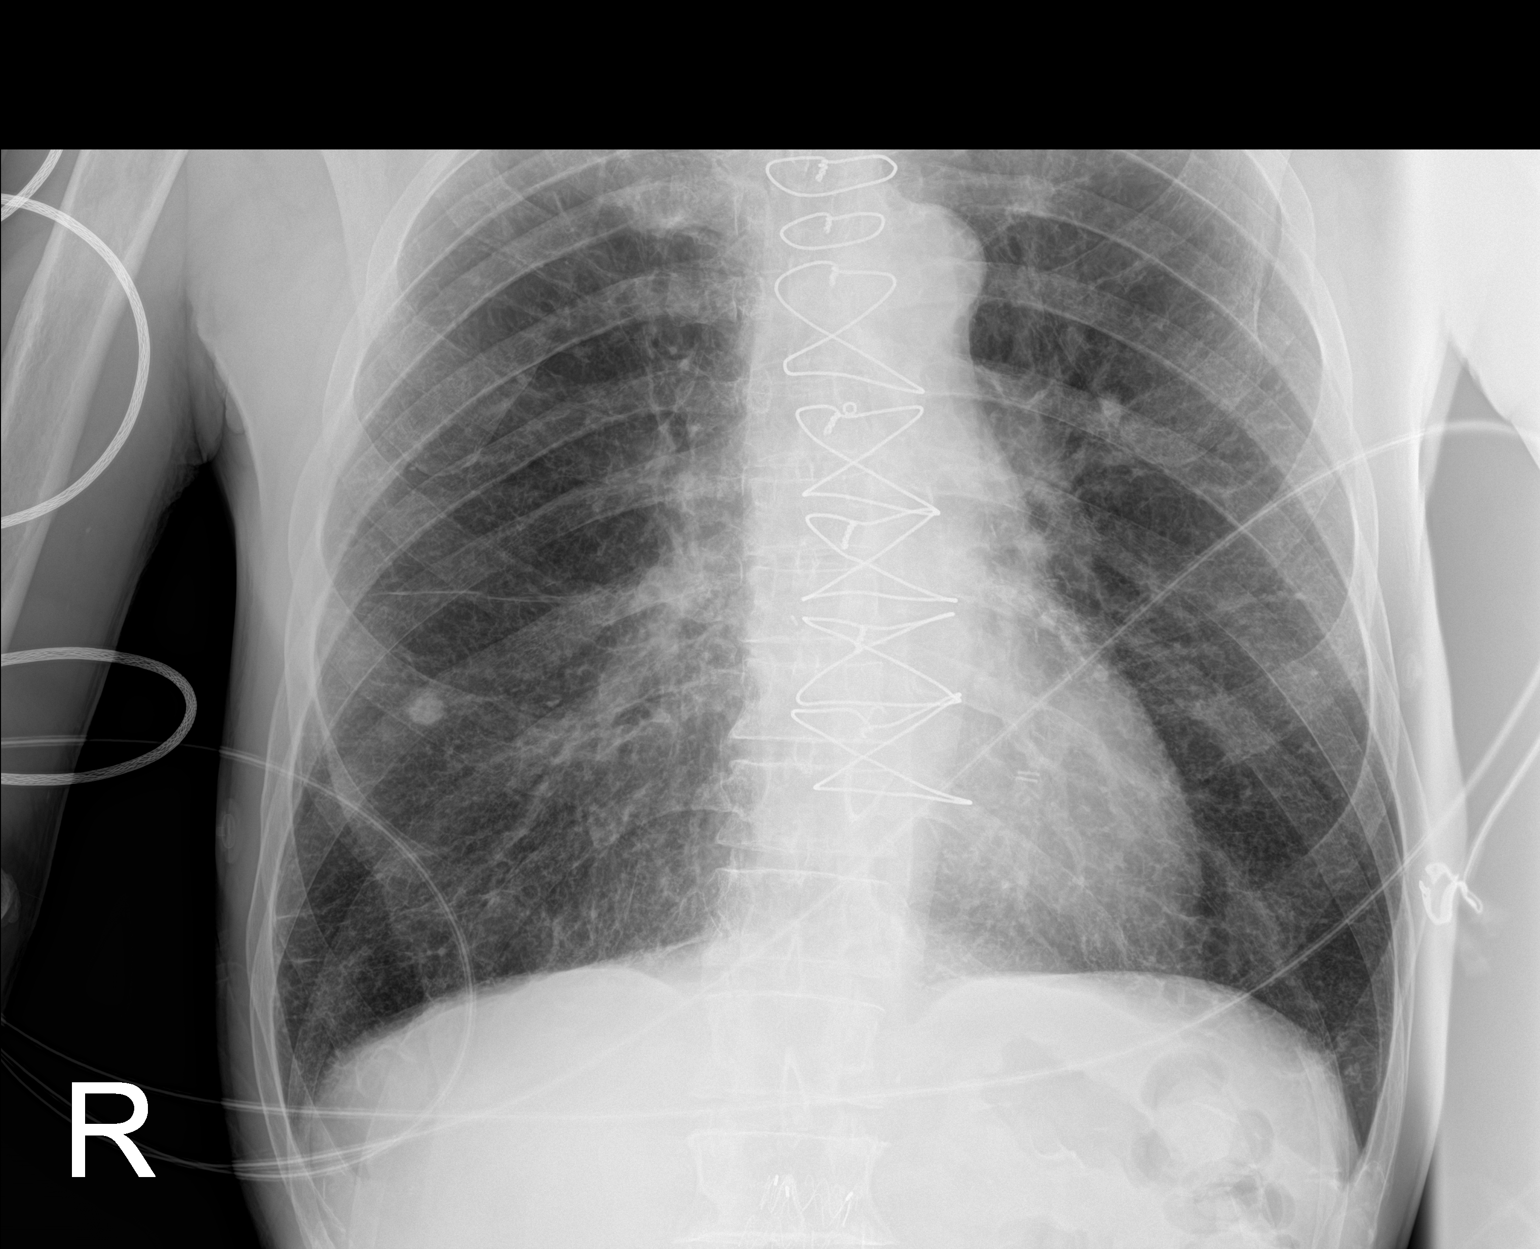

[2 of 2 positions shown; findings below may reference images not displayed]

FINDINGS: Cardiomediastinal silhouette is within normal limits. Coronary
bypass. Minimal aortic arch atherosclerosis. Hyperinflation with
relative flattening of the diaphragms, and bilateral interstitial
thickening. No discrete consolidation. Middle lobe calcified
granuloma. No pleural effusion or pneumothorax. Aortic stent graft
incompletely imaged at abdomen. Median sternotomy. No acute
displaced fracture.
IMPRESSION: Obstructive lung disease without acute superimposed cardiopulmonary
process.

Aortic Atherosclerosis ([FR]-[FR]) and Emphysema ([FR]-[FR]).

## 2022-02-06 SURGERY — INSERTION, ENDOVASCULAR STENT GRAFT, AORTA, ABDOMINAL
Anesthesia: General

## 2022-02-06 MED ORDER — SENNOSIDES-DOCUSATE SODIUM 8.6-50 MG PO TABS: 1.0000 | ORAL_TABLET | Freq: Every evening | ORAL | Status: DC | PRN

## 2022-02-06 MED ORDER — CEFAZOLIN SODIUM-DEXTROSE 2-4 GM/100ML-% IV SOLN
2.0000 g | Freq: Three times a day (TID) | INTRAVENOUS | Status: AC
  Administered 2022-02-06 – 2022-02-07 (×2): 2 g via INTRAVENOUS
  Filled 2022-02-06 (×2): qty 100

## 2022-02-06 MED ORDER — ALBUTEROL SULFATE (2.5 MG/3ML) 0.083% IN NEBU: 2.5000 mg | INHALATION_SOLUTION | Freq: Four times a day (QID) | RESPIRATORY_TRACT | Status: DC | PRN

## 2022-02-06 MED ORDER — PROTAMINE SULFATE 10 MG/ML IV SOLN
INTRAVENOUS | Status: DC | PRN
  Administered 2022-02-06: 40 mg via INTRAVENOUS
  Administered 2022-02-06: 10 mg via INTRAVENOUS

## 2022-02-06 MED ORDER — PROPOFOL 10 MG/ML IV BOLUS
INTRAVENOUS | Status: AC
  Filled 2022-02-06: qty 20

## 2022-02-06 MED ORDER — PROTAMINE SULFATE 10 MG/ML IV SOLN
INTRAVENOUS | Status: AC
  Filled 2022-02-06: qty 5

## 2022-02-06 MED ORDER — ACETAMINOPHEN 325 MG RE SUPP
325.0000 mg | RECTAL | Status: DC | PRN
  Filled 2022-02-06: qty 2

## 2022-02-06 MED ORDER — MECLIZINE HCL 25 MG PO TABS
25.0000 mg | ORAL_TABLET | Freq: Three times a day (TID) | ORAL | Status: DC | PRN
  Filled 2022-02-06: qty 1

## 2022-02-06 MED ORDER — OXYCODONE-ACETAMINOPHEN 5-325 MG PO TABS: 1.0000 | ORAL_TABLET | ORAL | Status: DC | PRN

## 2022-02-06 MED ORDER — HYDROCODONE-ACETAMINOPHEN 7.5-325 MG PO TABS
1.0000 | ORAL_TABLET | Freq: Four times a day (QID) | ORAL | Status: DC | PRN
  Administered 2022-02-06 – 2022-02-07 (×2): 1 via ORAL
  Filled 2022-02-06 (×2): qty 1

## 2022-02-06 MED ORDER — METOPROLOL TARTRATE 5 MG/5ML IV SOLN: 2.0000 mg | INTRAVENOUS | Status: DC | PRN

## 2022-02-06 MED ORDER — 0.9 % SODIUM CHLORIDE (POUR BTL) OPTIME
TOPICAL | Status: DC | PRN
  Administered 2022-02-06: 1000 mL

## 2022-02-06 MED ORDER — SODIUM CHLORIDE 0.9 % IV SOLN: INTRAVENOUS | Status: DC

## 2022-02-06 MED ORDER — LABETALOL HCL 5 MG/ML IV SOLN: 10.0000 mg | INTRAVENOUS | Status: DC | PRN

## 2022-02-06 MED ORDER — ASPIRIN EC 81 MG PO TBEC
81.0000 mg | DELAYED_RELEASE_TABLET | Freq: Every day | ORAL | Status: DC
  Administered 2022-02-06 – 2022-02-07 (×2): 81 mg via ORAL
  Filled 2022-02-06 (×2): qty 1

## 2022-02-06 MED ORDER — SUGAMMADEX SODIUM 200 MG/2ML IV SOLN
INTRAVENOUS | Status: DC | PRN
  Administered 2022-02-06: 200 mg via INTRAVENOUS

## 2022-02-06 MED ORDER — PHENOL 1.4 % MT LIQD
1.0000 | OROMUCOSAL | Status: DC | PRN
  Filled 2022-02-06: qty 177

## 2022-02-06 MED ORDER — HEPARIN 6000 UNIT IRRIGATION SOLUTION
Status: AC
  Filled 2022-02-06: qty 500

## 2022-02-06 MED ORDER — ONDANSETRON HCL 4 MG/2ML IJ SOLN
INTRAMUSCULAR | Status: AC
  Filled 2022-02-06: qty 2

## 2022-02-06 MED ORDER — MORPHINE SULFATE (PF) 2 MG/ML IV SOLN: 2.0000 mg | INTRAVENOUS | Status: DC | PRN

## 2022-02-06 MED ORDER — HEPARIN SODIUM (PORCINE) 1000 UNIT/ML IJ SOLN
INTRAMUSCULAR | Status: DC | PRN
  Administered 2022-02-06: 6000 [IU] via INTRAVENOUS

## 2022-02-06 MED ORDER — FENTANYL CITRATE (PF) 100 MCG/2ML IJ SOLN: 25.0000 ug | INTRAMUSCULAR | Status: DC | PRN

## 2022-02-06 MED ORDER — FENTANYL CITRATE (PF) 250 MCG/5ML IJ SOLN
INTRAMUSCULAR | Status: DC | PRN
Start: 2022-02-06 — End: 2022-02-06
  Administered 2022-02-06 (×3): 50 ug via INTRAVENOUS
  Administered 2022-02-06: 100 ug via INTRAVENOUS

## 2022-02-06 MED ORDER — OXYCODONE HCL 5 MG/5ML PO SOLN: 5.0000 mg | Freq: Once | ORAL | Status: DC | PRN

## 2022-02-06 MED ORDER — CEFAZOLIN SODIUM-DEXTROSE 1-4 GM/50ML-% IV SOLN
INTRAVENOUS | Status: AC
  Filled 2022-02-06: qty 50

## 2022-02-06 MED ORDER — POTASSIUM CHLORIDE CRYS ER 20 MEQ PO TBCR
20.0000 meq | EXTENDED_RELEASE_TABLET | Freq: Once | ORAL | Status: AC
  Administered 2022-02-06: 40 meq via ORAL
  Filled 2022-02-06: qty 1

## 2022-02-06 MED ORDER — OXYCODONE HCL 5 MG PO TABS: 5.0000 mg | ORAL_TABLET | Freq: Once | ORAL | Status: DC | PRN

## 2022-02-06 MED ORDER — ADULT MULTIVITAMIN W/MINERALS CH
1.0000 | ORAL_TABLET | Freq: Every day | ORAL | Status: DC
  Administered 2022-02-06 – 2022-02-07 (×2): 1 via ORAL
  Filled 2022-02-06 (×2): qty 1

## 2022-02-06 MED ORDER — HEPARIN SODIUM (PORCINE) 5000 UNIT/ML IJ SOLN
5000.0000 [IU] | Freq: Three times a day (TID) | INTRAMUSCULAR | Status: DC
  Administered 2022-02-06 – 2022-02-07 (×2): 5000 [IU] via SUBCUTANEOUS
  Filled 2022-02-06 (×2): qty 1

## 2022-02-06 MED ORDER — CHLORHEXIDINE GLUCONATE 0.12 % MT SOLN
OROMUCOSAL | Status: AC
  Administered 2022-02-06: 15 mL via OROMUCOSAL
  Filled 2022-02-06: qty 15

## 2022-02-06 MED ORDER — ALUM & MAG HYDROXIDE-SIMETH 200-200-20 MG/5ML PO SUSP: 15.0000 mL | ORAL | Status: DC | PRN

## 2022-02-06 MED ORDER — ROCURONIUM BROMIDE 10 MG/ML (PF) SYRINGE
PREFILLED_SYRINGE | INTRAVENOUS | Status: DC | PRN
  Administered 2022-02-06: 10 mg via INTRAVENOUS
  Administered 2022-02-06: 50 mg via INTRAVENOUS

## 2022-02-06 MED ORDER — ALPRAZOLAM 0.5 MG PO TABS
1.0000 mg | ORAL_TABLET | Freq: Four times a day (QID) | ORAL | Status: DC | PRN
  Administered 2022-02-07: 1 mg via ORAL
  Filled 2022-02-06: qty 2

## 2022-02-06 MED ORDER — PROTAMINE SULFATE 10 MG/ML IV SOLN
INTRAVENOUS | Status: AC
  Filled 2022-02-06: qty 25

## 2022-02-06 MED ORDER — ONDANSETRON HCL 4 MG/2ML IJ SOLN
INTRAMUSCULAR | Status: DC | PRN
  Administered 2022-02-06: 4 mg via INTRAVENOUS

## 2022-02-06 MED ORDER — HEPARIN SODIUM (PORCINE) 1000 UNIT/ML IJ SOLN
INTRAMUSCULAR | Status: AC
  Filled 2022-02-06: qty 40

## 2022-02-06 MED ORDER — MAGNESIUM SULFATE 2 GM/50ML IV SOLN: 2.0000 g | Freq: Every day | INTRAVENOUS | Status: DC | PRN

## 2022-02-06 MED ORDER — ROCURONIUM BROMIDE 10 MG/ML (PF) SYRINGE
PREFILLED_SYRINGE | INTRAVENOUS | Status: AC
  Filled 2022-02-06: qty 10

## 2022-02-06 MED ORDER — ORAL CARE MOUTH RINSE: 15.0000 mL | Freq: Once | OROMUCOSAL | Status: AC

## 2022-02-06 MED ORDER — LIDOCAINE 2% (20 MG/ML) 5 ML SYRINGE
INTRAMUSCULAR | Status: AC
  Filled 2022-02-06: qty 10

## 2022-02-06 MED ORDER — PHENYLEPHRINE HCL-NACL 20-0.9 MG/250ML-% IV SOLN
INTRAVENOUS | Status: DC | PRN
  Administered 2022-02-06: 35 ug/min via INTRAVENOUS

## 2022-02-06 MED ORDER — ACETAMINOPHEN 325 MG PO TABS: 325.0000 mg | ORAL_TABLET | ORAL | Status: DC | PRN

## 2022-02-06 MED ORDER — NITROGLYCERIN 0.4 MG SL SUBL: 0.4000 mg | SUBLINGUAL_TABLET | SUBLINGUAL | Status: DC | PRN

## 2022-02-06 MED ORDER — HYDRALAZINE HCL 20 MG/ML IJ SOLN: 5.0000 mg | INTRAMUSCULAR | Status: DC | PRN

## 2022-02-06 MED ORDER — PANTOPRAZOLE SODIUM 40 MG PO TBEC
40.0000 mg | DELAYED_RELEASE_TABLET | Freq: Every day | ORAL | Status: DC
  Administered 2022-02-06 – 2022-02-07 (×2): 40 mg via ORAL
  Filled 2022-02-06 (×2): qty 1

## 2022-02-06 MED ORDER — CHLORHEXIDINE GLUCONATE 0.12 % MT SOLN: 15.0000 mL | Freq: Once | OROMUCOSAL | Status: AC

## 2022-02-06 MED ORDER — HEPARIN 6000 UNIT IRRIGATION SOLUTION
Status: DC | PRN
  Administered 2022-02-06: 1

## 2022-02-06 MED ORDER — POTASSIUM CHLORIDE CRYS ER 20 MEQ PO TBCR: 20.0000 meq | EXTENDED_RELEASE_TABLET | Freq: Every day | ORAL | Status: DC | PRN

## 2022-02-06 MED ORDER — VITAMIN B-12 1000 MCG PO TABS
1000.0000 ug | ORAL_TABLET | Freq: Every day | ORAL | Status: DC
  Administered 2022-02-06 – 2022-02-07 (×2): 1000 ug via ORAL
  Filled 2022-02-06 (×2): qty 1

## 2022-02-06 MED ORDER — IODIXANOL 320 MG/ML IV SOLN
INTRAVENOUS | Status: DC | PRN
  Administered 2022-02-06: 45 mL via INTRA_ARTERIAL

## 2022-02-06 MED ORDER — FENTANYL CITRATE (PF) 250 MCG/5ML IJ SOLN
INTRAMUSCULAR | Status: AC
  Filled 2022-02-06: qty 5

## 2022-02-06 MED ORDER — FLEET ENEMA 7-19 GM/118ML RE ENEM: 1.0000 | ENEMA | Freq: Once | RECTAL | Status: DC | PRN

## 2022-02-06 MED ORDER — ONDANSETRON HCL 4 MG/2ML IJ SOLN: 4.0000 mg | Freq: Four times a day (QID) | INTRAMUSCULAR | Status: DC | PRN

## 2022-02-06 MED ORDER — CEFAZOLIN SODIUM-DEXTROSE 2-4 GM/100ML-% IV SOLN
2.0000 g | Freq: Three times a day (TID) | INTRAVENOUS | Status: AC
  Administered 2022-02-06 (×2): 2 g via INTRAVENOUS
  Filled 2022-02-06 (×2): qty 100

## 2022-02-06 MED ORDER — BISACODYL 5 MG PO TBEC: 5.0000 mg | DELAYED_RELEASE_TABLET | Freq: Every day | ORAL | Status: DC | PRN

## 2022-02-06 MED ORDER — LIDOCAINE 2% (20 MG/ML) 5 ML SYRINGE
INTRAMUSCULAR | Status: DC | PRN
  Administered 2022-02-06: 60 mg via INTRAVENOUS

## 2022-02-06 MED ORDER — PANTOPRAZOLE SODIUM 40 MG PO TBEC: 40.0000 mg | DELAYED_RELEASE_TABLET | Freq: Two times a day (BID) | ORAL | Status: DC

## 2022-02-06 MED ORDER — DOCUSATE SODIUM 100 MG PO CAPS
100.0000 mg | ORAL_CAPSULE | Freq: Every day | ORAL | Status: DC
  Administered 2022-02-07: 100 mg via ORAL
  Filled 2022-02-06: qty 1

## 2022-02-06 MED ORDER — LACTATED RINGERS IV SOLN: INTRAVENOUS | Status: DC

## 2022-02-06 MED ORDER — GUAIFENESIN-DM 100-10 MG/5ML PO SYRP: 15.0000 mL | ORAL_SOLUTION | ORAL | Status: DC | PRN

## 2022-02-06 MED ORDER — SODIUM CHLORIDE 0.9 % IV SOLN: 500.0000 mL | Freq: Once | INTRAVENOUS | Status: DC | PRN

## 2022-02-06 MED ORDER — PROPOFOL 10 MG/ML IV BOLUS
INTRAVENOUS | Status: DC | PRN
  Administered 2022-02-06: 120 mg via INTRAVENOUS

## 2022-02-06 MED ORDER — CEFAZOLIN SODIUM-DEXTROSE 1-4 GM/50ML-% IV SOLN
INTRAVENOUS | Status: DC | PRN
  Administered 2022-02-06: 1 g via INTRAVENOUS

## 2022-02-06 SURGICAL SUPPLY — 54 items
ADH SKN CLS APL DERMABOND .7 (GAUZE/BANDAGES/DRESSINGS) ×2
BAG COUNTER SPONGE SURGICOUNT (BAG) ×3 IMPLANT
BAG SPNG CNTER NS LX DISP (BAG) ×2
BLADE CLIPPER SURG (BLADE) ×3 IMPLANT
CANISTER SUCT 3000ML PPV (MISCELLANEOUS) ×3 IMPLANT
CATH BEACON 5.038 65CM KMP-01 (CATHETERS) ×3 IMPLANT
CATH OMNI FLUSH .035X70CM (CATHETERS) ×3 IMPLANT
DERMABOND ADVANCED (GAUZE/BANDAGES/DRESSINGS) ×1
DERMABOND ADVANCED .7 DNX12 (GAUZE/BANDAGES/DRESSINGS) ×2 IMPLANT
DEVICE CLOSURE PERCLS PRGLD 6F (VASCULAR PRODUCTS) ×8 IMPLANT
DRSG TEGADERM 2-3/8X2-3/4 SM (GAUZE/BANDAGES/DRESSINGS) ×6 IMPLANT
DRYSEAL FLEXSHEATH 16FR 33CM (SHEATH) ×2
ELECT CAUTERY BLADE 6.4 (BLADE) ×3 IMPLANT
ELECT REM PT RETURN 9FT ADLT (ELECTROSURGICAL) ×6
ELECTRODE REM PT RTRN 9FT ADLT (ELECTROSURGICAL) ×4 IMPLANT
EXCLDR TRNK ENDO 26X14.5X12 16 (Endovascular Graft) ×3 IMPLANT
EXCLUDER TNK END 26X14.5X12 16 (Endovascular Graft) IMPLANT
GAUZE SPONGE 2X2 8PLY STRL LF (GAUZE/BANDAGES/DRESSINGS) ×4 IMPLANT
GLOVE SURG SS PI 7.5 STRL IVOR (GLOVE) ×9 IMPLANT
GOWN STRL REUS W/ TWL LRG LVL3 (GOWN DISPOSABLE) ×4 IMPLANT
GOWN STRL REUS W/ TWL XL LVL3 (GOWN DISPOSABLE) ×4 IMPLANT
GOWN STRL REUS W/TWL LRG LVL3 (GOWN DISPOSABLE) ×6
GOWN STRL REUS W/TWL XL LVL3 (GOWN DISPOSABLE) ×6
GRAFT BALLN CATH 65CM (STENTS) ×2 IMPLANT
GUIDEWIRE ANGLED .035X150CM (WIRE) IMPLANT
KIT BASIN OR (CUSTOM PROCEDURE TRAY) ×3 IMPLANT
KIT TURNOVER KIT B (KITS) ×3 IMPLANT
LEG CONTRALATERAL 16X20X9.5 (Endovascular Graft) ×3 IMPLANT
LEG CONTRALATERAL 27X10 (Vascular Products) ×1 IMPLANT
NS IRRIG 1000ML POUR BTL (IV SOLUTION) ×3 IMPLANT
PACK ENDOVASCULAR (PACKS) ×3 IMPLANT
PAD ARMBOARD 7.5X6 YLW CONV (MISCELLANEOUS) ×6 IMPLANT
PENCIL BUTTON HOLSTER BLD 10FT (ELECTRODE) ×3 IMPLANT
PERCLOSE PROGLIDE 6F (VASCULAR PRODUCTS) ×12
SET MICROPUNCTURE 5F STIFF (MISCELLANEOUS) ×3 IMPLANT
SHEATH BRITE TIP 8FR 23CM (SHEATH) ×3 IMPLANT
SHEATH DRYSEAL FLEX 16FR 33CM (SHEATH) IMPLANT
SHEATH PINNACLE 8F 10CM (SHEATH) ×3 IMPLANT
SPONGE GAUZE 2X2 STER 10/PKG (GAUZE/BANDAGES/DRESSINGS) ×2
STENT GRAFT BALLN CATH 65CM (STENTS) ×3
STENT GRAFT CONTRALAT 16X20X9. (Endovascular Graft) IMPLANT
STOPCOCK MORSE 400PSI 3WAY (MISCELLANEOUS) ×3 IMPLANT
SUT PROLENE 5 0 C 1 24 (SUTURE) IMPLANT
SUT VIC AB 2-0 CT1 27 (SUTURE)
SUT VIC AB 2-0 CT1 TAPERPNT 27 (SUTURE) IMPLANT
SUT VIC AB 3-0 SH 27 (SUTURE)
SUT VIC AB 3-0 SH 27X BRD (SUTURE) IMPLANT
SUT VICRYL 4-0 PS2 18IN ABS (SUTURE) IMPLANT
SYR 20ML LL LF (SYRINGE) ×3 IMPLANT
TOWEL GREEN STERILE (TOWEL DISPOSABLE) ×3 IMPLANT
TRAY FOLEY MTR SLVR 16FR STAT (SET/KITS/TRAYS/PACK) ×3 IMPLANT
TUBING HIGH PRESSURE 120CM (CONNECTOR) ×3 IMPLANT
WIRE AMPLATZ SS-J .035X180CM (WIRE) ×6 IMPLANT
WIRE BENTSON .035X145CM (WIRE) ×6 IMPLANT

## 2022-02-06 NOTE — Op Note (Signed)
? ? ?Patient name: [name] MRN: [id number] DOB: 10/18/1951 Sex: male ? ?02/06/2022 ?Pre-operative Diagnosis: Abdominal aortic aneurysm ?Post-operative diagnosis:  Same ?Surgeon:  Durene Cal ?Assistants: Clotilde Dieter, MD ?Procedure:   #1: Endovascular repair of symptomatic abdominal aortic aneurysm ?  #2: Bilateral ultrasound-guided percutaneous access ?  #3: Abdominal aortogram ?  #4: Catheter in aorta x2 ?Anesthesia:  General ?Blood Loss:  minimal ?Specimens:  none ? ?Findings:  complete exclusion ? ?Devices used: Main body was primary left Gore 26 x 14 x 12.  Ipsilateral extension was a Gore left 20 x 9.5.  Contralateral right Gore 27 x 10 ? ?Indications: This is a 71 year old gentleman with known abdominal aortic aneurysm who presented to the emergency department with several day history of abdominal pain.  CT scan showed increase in size of his aneurysm to 5.4 cm.  We discussed proceeding with endovascular repair. ? ?Procedure:  The patient was identified in the holding area and taken to [facility] OR ROOM 16  The patient was then placed supine on the table. general anesthesia was administered.  The patient was prepped and draped in the usual sterile fashion.  A time out was called and antibiotics were administered.  Dr. Chestine Spore provided necessary assistance for the procedure including help with wire management and stent graft deployment as well as assistance with closure of the groins. ? ?Ultrasound was used to evaluate bilateral common femoral arteries which are widely patent and easily compressible.  A #11 blade was used to make a skin nick bilaterally and bilateral common femoral arteries were cannulated under ultrasound guidance with a micropuncture needle.  A 018 wire was advanced bilaterally without resistance followed by placement of micropuncture sheath.  035 wire was then inserted into each groin.  The subcutaneous tract was dilated with a 8 French dilator, and Pro-glide devices were deployed at the  11:00 and 1 o'clock position for preclosure. ? ?8 [place] sheaths were placed bilaterally and the patient was fully heparinized.  Over a Meier wire, a 16 French sheath was advanced up the left side and a pigtail catheter was inserted up the right.  The patient was fully heparinized.  The main body device was prepared on the back table and inserted at the left side.  This was a Gore 26 x 14 device.  A abdominal aortogram was performed with 30 degrees of cranial orientation locating the renal arteries.  The main body device was then deployed and positioned at the level of the lowest left renal artery.  Next, the contralateral gate was cannulated with a Berenstein catheter and a 035 wire.  A 16 French sheath was then inserted.  A pigtail catheter was able to be read freely rotated within the main body of the device, confirming successful cannulation.  Contrast injection was then performed confirming that the device was in good position.  The restraining ties were then released.  The image detector was rotated to a LAO position and a retrograde injection was performed through the sheath in the right groin locating the right hypogastric artery.  A right-sided limb was then inserted.  This was a 26 x 10 device.  It was then successfully deployed landing just proximal to the right hypogastric artery.  Next the image detector was rotated to a RAO position and a retrograde injection was performed to the sheath in the left groin, after the main body device had been to fully deployed.  A retrograde injection was performed through the sheath and left groin with  the image detector and a RAO projection.  This located the left hypogastric artery.  The left-sided device was then inserted.  This was a Gore 20 x 9.5 device.  It was deployed landing at the level left hypogastric artery.  Next, a MOB balloon was inserted and used to mold the proximal and distal attachment sites.  A completion arteriogram was then performed which showed  successful exclusion of the aneurysm with no evidence of endoleak.  The stiff wires were exchanged out for 035 Bentson wires.  The sheaths were then removed from each groin and the arteriotomy sites were closed by securing the Pro-glide's.  50 mg of protamine was then administered to reverse the heparin.  Both groins were then hemostatic.  Cautery was used on subcutaneous tissue.  The patient had Doppler signals in both feet similar to preoperative evaluation.  Dermabond was placed on the incisions.  The patient was successfully aspirated and taken recovery in stable condition.  There were no immediate complications. ? ? ?Disposition: To PACU stable. ? ? ?V. Durene Cal, M.D., FACS ?[facility] ?Office: [phone] ?Pager:  [phone]  ?

## 2022-02-06 NOTE — Anesthesia Preprocedure Evaluation (Signed)
Anesthesia Evaluation  ?Patient identified by MRN, date of birth, ID band ?Patient awake ? ? ? ?Reviewed: ?Allergy & Precautions, H&P , NPO status , Patient's Chart, lab work & pertinent test results ? ?Airway ?Mallampati: II ? ? ?Neck ROM: full ? ? ? Dental ?  ?Pulmonary ?COPD, Current Smoker and Patient abstained from smoking.,  ?  ?breath sounds clear to auscultation ? ? ? ? ? ? Cardiovascular ?+ CAD, + Past MI and + CABG  ?+ Valvular Problems/Murmurs  ?Rhythm:regular Rate:Normal ? 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 ?  ?Neuro/Psych ?Anxiety  Neuromuscular disease   ? GI/Hepatic ?GERD  ,  ?Endo/Other  ? ? Renal/GU ?  ? ?  ?Musculoskeletal ? ?(+) Arthritis ,  ? Abdominal ?  ?Peds ? Hematology ?  ?Anesthesia Other Findings ? ? Reproductive/Obstetrics ? ?  ? ? ? ? ? ? ? ? ? ? ? ? ? ?  ?  ? ? ? ? ? ? ? ? ?Anesthesia Physical ?Anesthesia Plan ? ?ASA: 3 ? ?Anesthesia Plan: General  ? ?Post-op Pain Management:   ? ?Induction: Intravenous ? ?PONV Risk Score and Plan: 1 and Ondansetron and Treatment may vary due to age or medical condition ? ?Airway Management Planned: Oral ETT ? ?Additional Equipment: Arterial line ? ?Intra-op Plan:  ? ?Post-operative Plan: Extubation in OR ? ?Informed Consent: I have reviewed the patients History and Physical, chart, labs and discussed the procedure including the risks, benefits and alternatives for the proposed anesthesia with the patient or authorized representative who has indicated his/her understanding and acceptance.  ? ? ? ?Dental advisory given ? ?Plan Discussed with: CRNA, Anesthesiologist and Surgeon ? ?Anesthesia Plan Comments:   ? ? ? ? ? ? ?Anesthesia Quick Evaluation ? ?

## 2022-02-06 NOTE — Anesthesia Procedure Notes (Signed)
Procedure Name: Intubation ?Date/Time: 02/06/2022 1:43 PM ?Performed by: Cathren Harsh, CRNA ?Pre-anesthesia Checklist: Patient identified, Emergency Drugs available, Suction available and Patient being monitored ?Patient Re-evaluated:Patient Re-evaluated prior to induction ?Oxygen Delivery Method: Circle System Utilized ?Preoxygenation: Pre-oxygenation with 100% oxygen ?Induction Type: IV induction ?Ventilation: Mask ventilation without difficulty ?Laryngoscope Size: Mac and 4 ?Grade View: Grade II ?Tube type: Oral ?Number of attempts: 1 ?Airway Equipment and Method: Stylet and Oral airway ?Placement Confirmation: ETT inserted through vocal cords under direct vision, positive ETCO2 and breath sounds checked- equal and bilateral ?Secured at: 22 cm ?Tube secured with: Tape ?Dental Injury: Teeth and Oropharynx as per pre-operative assessment  ? ? ? ? ?

## 2022-02-06 NOTE — Progress Notes (Signed)
Patient seen and examined in the recovery room status post endovascular aneurysm repair.  He no longer has abdominal pain.  His feet are warm well perfused.  His abdomen is soft and nontender.  Groin incisions are without hematoma.  He is stable for transfer to 4 E. ? ?Durene Cal ?

## 2022-02-06 NOTE — Anesthesia Procedure Notes (Signed)
Arterial Line Insertion ?Start/End4/19/2023 1:36 PM, 02/06/2022 1:38 PM ?Performed by: Albertha Ghee, MD, Cathren Harsh, CRNA, CRNA ? Patient location: Pre-op. ?Preanesthetic checklist: patient identified, IV checked, site marked, risks and benefits discussed, surgical consent, monitors and equipment checked, pre-op evaluation, timeout performed and anesthesia consent ?Lidocaine 1% used for infiltration ?Right, radial was placed ?Catheter size: 20 G ?Hand hygiene performed , maximum sterile barriers used  and Seldinger technique used ?Allen's test indicative of satisfactory collateral circulation ?Attempts: 1 ?Procedure performed without using ultrasound guided technique. ?Following insertion, dressing applied. ?Post procedure assessment: normal and unchanged ? ?Patient tolerated the procedure well with no immediate complications. ? ? ?

## 2022-02-06 NOTE — H&P (Signed)
? ?[facility] ? ?Patient name: [name] MRN: [id number] DOB: [birth year] Sex: male ? ? ?REQUESTING PROVIDER:  ? ? ER ? ? ?REASON FOR CONSULT:  ?  ?AAA ? ?HISTORY OF PRESENT ILLNESS:  ? ?[name] is a 71 y.o. male, who has been followed for an abdominal aortic aneurysm.  A year ago it measured approximately 5 cm.  The patient presented to the ER today with abdominal pain which had been present for 6 days.  He describes this as constant and just to the left of his umbilicus.  It has been getting worse with time.  He describes it as a burning type pain without radiation.  He has also had some nausea and intermittent diarrhea.  He presented to the [facility] earlier today and had an ultrasound that showed an increase in size of his aneurysm to 5.5 cm.  He then came to [facility] for further evaluation. ? 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  He is a Naval architect.  He has a history of coronary artery disease, status post CABG in [facility].  He denies claudication symptoms.  He denies neurologic symptoms. ? ?PAST MEDICAL HISTORY  ? ? ?Past Medical History:  ?Diagnosis Date  ? AAA (abdominal aortic aneurysm) (HCC)   ? Anxiety   ? Arthritis   ? COPD (chronic obstructive pulmonary disease) (HCC)   ? Coronary artery disease involving native coronary artery with angina pectoris (HCC) 01/2015  ? 100% LAD, Severe dom RCA dz, small non-dom Cx --> CABG x 2 LIMA-LAD, SVG-RCA  ? MI (myocardial infarction) (HCC)   ? Pulmonary nodule   ? Benign  ? Status post tricuspid valve repair   ? ? ? ?FAMILY HISTORY  ? ?Family History  ?Problem Relation Age of Onset  ? Heart disease Father   ? Heart attack Sister   ?     Died in her 17s  ? CAD Brother   ? Heart attack Brother   ? CAD Brother   ? Heart attack Brother   ? ? ?SOCIAL HISTORY:  ? ?Social History  ? ?Socioeconomic History  ? Marital status: Divorced  ?  Spouse name:  Not on file  ? Number of children: Not on file  ? Years of education: Not on file  ? Highest education level: Not on file  ?Occupational History  ? Not on file  ?Tobacco Use  ? Smoking status: Every Day  ?  Packs/day: 2.00  ?  Years: 60.00  ?  Pack years: 120.00  ?  Types: Cigarettes  ?  Last attempt to quit: 02/11/2015  ?  Years since quitting: 6.9  ? Smokeless tobacco: Never  ?Vaping Use  ? Vaping Use: Never used  ?Substance and Sexual Activity  ? Alcohol use: Not Currently  ?  Alcohol/week: 1.0 standard drink  ?  Types: 1 Cans of beer per week  ?  Comment: one beer a week  ? Drug use: No  ? Sexual activity: Not on file  ?Other Topics Concern  ? Not on file  ?Social History Narrative  ? Not on file  ? ?Social Determinants of Health  ? ?Financial Resource Strain: Not on file  ?Food Insecurity: Not on file  ?Transportation Needs: Not on file  ?Physical Activity: Not on file  ?Stress: Not on file  ?Social Connections: Not on file  ?Intimate Partner Violence: Not on file  ? ? ?ALLERGIES:  ? ? ?  No Known Allergies ? ?CURRENT MEDICATIONS:  ? ? ?No current facility-administered medications for this encounter.  ? ?Current Outpatient Medications  ?Medication Sig Dispense Refill  ? albuterol (VENTOLIN HFA) 108 (90 Base) MCG/ACT inhaler Inhale 1-2 puffs into the lungs every 6 (six) hours as needed for wheezing or shortness of breath. 18 g 0  ? ALPRAZolam (XANAX) 1 MG tablet Take 1 mg by mouth 4 (four) times daily as needed for anxiety.   2  ? aspirin EC 81 MG tablet Take 81 mg by mouth daily.     ? carbamazepine (TEGRETOL XR) 200 MG 12 hr tablet Take 400 mg by mouth 2 (two) times daily. Start with 1 tablet twice a week, then increase to 2 tablets twice daily    ? HYDROcodone-acetaminophen (NORCO) 7.5-325 MG tablet Take 1 tablet by mouth every 6 (six) hours as needed for moderate pain.   0  ? meclizine (ANTIVERT) 25 MG tablet Take 1 tablet (25 mg total) by mouth 3 (three) times daily as needed for dizziness. 30 tablet 0  ?  nitroGLYCERIN (NITROSTAT) 0.4 MG SL tablet PLACE 1 TABLET UNDER THE TONGUE EVERY 5 MINUTES AS NEEDED FOR CHEST PAIN 100 tablet 0  ? omeprazole (PRILOSEC) 40 MG capsule Take 40 mg by mouth daily.     ? pantoprazole (PROTONIX) 40 MG tablet Take 1 tablet (40 mg total) by mouth 2 (two) times daily before a meal. 60 tablet 0  ? zolpidem (AMBIEN) 10 MG tablet Take 10 mg by mouth at bedtime as needed for sleep.   1  ? ? ?REVIEW OF SYSTEMS:  ? ?[X]  denotes positive finding, [ ]  denotes negative finding ?Cardiac  Comments:  ?Chest pain or chest pressure:    ?Shortness of breath upon exertion:    ?Short of breath when lying flat:    ?Irregular heart rhythm:    ?    ?Vascular    ?Pain in calf, thigh, or hip brought on by ambulation:    ?Pain in feet at night that wakes you up from your sleep:     ?Blood clot in your veins:    ?Leg swelling:     ?    ?Pulmonary    ?Oxygen at home:    ?Productive cough:     ?Wheezing:     ?    ?Neurologic    ?Sudden weakness in arms or legs:     ?Sudden numbness in arms or legs:     ?Sudden onset of difficulty speaking or slurred speech:    ?Temporary loss of vision in one eye:     ?Problems with dizziness:     ?    ?Gastrointestinal    ?Blood in stool:     ? ?Vomited blood:     ?    ?Genitourinary    ?Burning when urinating:     ?Blood in urine:    ?    ?Psychiatric    ?Major depression:     ?    ?Hematologic    ?Bleeding problems:    ?Problems with blood clotting too easily:    ?    ?Skin    ?Rashes or ulcers:    ?    ?Constitutional    ?Fever or chills:    ? ?PHYSICAL EXAM:  ? ?Vitals:  ? 02/05/22 2000 02/05/22 2015 02/05/22 2130 02/05/22 2200  ?BP: 138/79 136/71 122/62 (!) 144/88  ?Pulse: 74 65 63 66  ?Resp: 15 16 14  (!) 28  ?Temp:      ?  TempSrc:      ?SpO2: 97% 95% 93% 93%  ? ? ?GENERAL: The patient is a well-nourished male, in no acute distress. The vital signs are documented above. ?CARDIAC: There is a regular rate and rhythm.  ?VASCULAR: Palpable femoral pulses, nonpalpable pedal  pulses ?PULMONARY: Nonlabored respirations ?ABDOMEN: Soft.  His aorta is easily palpable.  It is not tender to palpation.  His discomfort is lateral to the midline. ?MUSCULOSKELETAL: There are no major deformities or cyanosis. ?NEUROLOGIC: No focal weakness or paresthesias are detected. ?SKIN: There are no ulcers or rashes noted. ?PSYCHIATRIC: The patient has a normal affect. ? ?STUDIES:  ? ?I have reviewed his CT scan with the following findings: ?1. 5.4 cm infrarenal abdominal aortic aneurysm has increased in ?size. Recommend follow-up CT/MR every 6 months and vascular ?consultation. This recommendation follows ACR consensus guidelines: ?White Paper of the ACR Incidental Findings Committee II on Vascular ?Findings. J Am Coll Radiol 2013; 10:789-794. ?2. New thrombosis within visualized right femoral artery. ?3. 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 ?4. 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 ?5. No acute cardiopulmonary process. ?6. 1.3 cm spiculated nodule in the right lung apex remains ?suspicious, but has mildly decreased in size. Continued follow-up ?recommended in 3 months to re-evaluate. PET-CT should be considered. ?7. Aortic Atherosclerosis (ICD10-I70.0) and Emphysema (ICD10-J43.9). ? ?ASSESSMENT and PLAN  ? ?Abdominal aortic aneurysm: Maximum diameter is 5.4 cm.  I am not convinced that his aneurysm is the source of his pain, however based on its size, I have recommended repair.  I will plan on doing this later today.  I discussed the details of the operation including the risks and benefits, not limited to renal insufficiency, bleeding, intestinal ischemia, and cardiopulmonary complications.  All questions were answered and he wishes to proceed.  He will continue to be n.p.o.  He will also need to be started on a statin. ? ? ?Durene CalWells Jennier Schissler, IV, MD, FACS ?[facility] ?Tel [phone] ?Pager [phone]  ?

## 2022-02-06 NOTE — Transfer of Care (Signed)
Immediate Anesthesia Transfer of Care Note ? ?Patient: [name] ? ?Procedure(s) Performed: ENDOVASUCLAR REPAIR OF ABDOMINAL AORTIC  ANEURSYM ?ULTRASOUND GUIDANCE FOR VASCULAR ACCESS ? ?Patient Location: PACU ? ?Anesthesia Type:General ? ?Level of Consciousness: drowsy, patient cooperative, lethargic and responds to stimulation ? ?Airway & Oxygen Therapy: Patient Spontanous Breathing and Patient connected to nasal cannula oxygen ? ?Post-op Assessment: Report given to RN, Post -op Vital signs reviewed and stable and Patient moving all extremities X 4 ? ?Post vital signs: Reviewed and stable ? ?Last Vitals:  ?Vitals Value Taken Time  ?BP 145/79 02/06/22 1518  ?Temp    ?Pulse 89 02/06/22 1519  ?Resp 16 02/06/22 1519  ?SpO2 100 % 02/06/22 1519  ?Vitals shown include unvalidated device data. ? ?Last Pain:  ?Vitals:  ? 02/06/22 1306  ?TempSrc:   ?PainSc: 0-No pain  ?   ? ?  ? ?Complications: No notable events documented. ?

## 2022-02-06 NOTE — Interval H&P Note (Signed)
History and Physical Interval Note: ? ?02/06/2022 ?12:05 PM ? ?[name]  has presented today for surgery, with the diagnosis of aneursym.  The various methods of treatment have been discussed with the patient and family. After consideration of risks, benefits and other options for treatment, the patient has consented to  Procedure(s): ?ENDOVASUCLAR REPAIR OF ABDOMINAL AORTIC  ANEURSYM (N/A) as a surgical intervention.  The patient's history has been reviewed, patient examined, no change in status, stable for surgery.  I have reviewed the patient's chart and labs.  Questions were answered to the patient's satisfaction.   ? ? ?[name] ? ? ?

## 2022-02-06 NOTE — Progress Notes (Signed)
Patient being taken down to short stay at this time for procedure.  Consent was signed and placed on chart.  CHG bath was given.  PIV was saline locked.  All patient belongings left at the bedside.  Report was given to Junious Dresser, RN in short stay. ?

## 2022-02-07 ENCOUNTER — Encounter (HOSPITAL_COMMUNITY): Payer: Self-pay | Admitting: Surgery

## 2022-02-07 LAB — BASIC METABOLIC PANEL
Anion gap: 6 (ref 5–15)
BUN: 12 mg/dL (ref 8–23)
CO2: 27 mmol/L (ref 22–32)
Calcium: 9.1 mg/dL (ref 8.9–10.3)
Chloride: 104 mmol/L (ref 98–111)
Creatinine, Ser: 1.04 mg/dL (ref 0.61–1.24)
GFR, Estimated: >60 mL/min (ref >60)
Glucose, Bld: 77 mg/dL (ref 70–99)
Potassium: 4.2 mmol/L (ref 3.5–5.1)
Sodium: 137 mmol/L (ref 135–145)

## 2022-02-07 LAB — CBC
HCT: 38.9 % — ABNORMAL LOW (ref 39.0–52.0)
Hemoglobin: 13.2 g/dL (ref 13.0–17.0)
MCH: 35.3 pg — ABNORMAL HIGH (ref 26.0–34.0)
MCHC: 33.9 g/dL (ref 30.0–36.0)
MCV: 104 fL — ABNORMAL HIGH (ref 80.0–100.0)
Platelets: 147 10*3/uL — ABNORMAL LOW (ref 150–400)
RBC: 3.74 MIL/uL — ABNORMAL LOW (ref 4.22–5.81)
RDW: 13.5 % (ref 11.5–15.5)
WBC: 9.9 10*3/uL (ref 4.0–10.5)
nRBC: 0 % (ref 0.0–0.2)

## 2022-02-07 MED ORDER — ROSUVASTATIN CALCIUM 10 MG PO TABS: 10.0000 mg | ORAL_TABLET | Freq: Every day | ORAL | 11 refills | Status: DC

## 2022-02-07 MED ORDER — HYDROCODONE-ACETAMINOPHEN 7.5-325 MG PO TABS: 1.0000 | ORAL_TABLET | Freq: Four times a day (QID) | ORAL | 0 refills | Status: AC | PRN

## 2022-02-07 NOTE — TOC Transition Note (Signed)
Transition of Care (TOC) - CM/SW Discharge Note ?Donn Pierini Charity fundraiser, BSN ?Transitions of Care ?Unit 4E- RN Case Manager ?See Treatment Team for direct phone #  ? ? ?Patient Details  ?Name: [name] ?MRN: [id number] ?Date of Birth: Jun 09, 1951 ? ?Transition of Care (TOC) CM/SW Contact:  ?Zenda Alpers, Lenn Sink, RN ?Phone Number: ?02/07/2022, 10:56 AM ? ? ?Clinical Narrative:    ?Pt stable for transition home today s/p AAA repair. Transition of Care Department [facility]) has reviewed patient and no TOC needs have been identified at this time.  ? ? ?Final next level of care: Home/Self Care ?Barriers to Discharge: No Barriers Identified ? ? ?Patient Goals and CMS Choice ?  ?  ?Choice offered to / list presented to : NA ? ?Discharge Placement ?  ?           ? Home ?  ?  ?  ? ?Discharge Plan and Services ?  ?  ?Post Acute Care Choice: NA          ?DME Arranged: N/A ?DME Agency: NA ?  ?  ?  ?HH Arranged: NA ?HH Agency: NA ?  ?  ?  ? ?Social Determinants of Health (SDOH) Interventions ?  ? ? ?Readmission Risk Interventions ? ?  02/07/2022  ? 10:56 AM  ?Readmission Risk Prevention Plan  ?Post Dischage Appt Complete  ?Medication Screening Complete  ?Transportation Screening Complete  ? ? ? ? ? ?

## 2022-02-07 NOTE — Progress Notes (Addendum)
?  Progress Note ? ? ? ?02/07/2022 ?6:55 AM ?1 Day Post-Op ? ?Subjective:  feels much better and ready to go home.  ? ?Afebrile ?HR 70's-80's ?90's-130's systolic ?93% RA ? ?Vitals:  ? 02/07/22 0030 02/07/22 0434  ?BP: (!) 92/53 (!) 103/49  ?Pulse: 82 81  ?Resp: 18 18  ?Temp: 98.2 ?F (36.8 ?C) 98 ?F (36.7 ?C)  ?SpO2: 94% 94%  ? ? ?Physical Exam: ?Cardiac:  regular ?Lungs:  non labored ?Incisions:  bilateral groins are soft; mild bloody drainage on the left groin  ?Extremities:  right monophasic DP/PT and left monophasic PT/pero; bilateral feet are warm ?Abdomen:  soft, NT ? ?CBC ?   ?Component Value Date/Time  ? WBC 9.9 02/07/2022 0124  ? RBC 3.74 (L) 02/07/2022 0124  ? HGB 13.2 02/07/2022 0124  ? HCT 38.9 (L) 02/07/2022 0124  ? PLT 147 (L) 02/07/2022 0124  ? MCV 104.0 (H) 02/07/2022 0124  ? MCH 35.3 (H) 02/07/2022 0124  ? MCHC 33.9 02/07/2022 0124  ? RDW 13.5 02/07/2022 0124  ? LYMPHSABS 2.2 02/05/2022 1524  ? MONOABS 0.7 02/05/2022 1524  ? EOSABS 0.1 02/05/2022 1524  ? BASOSABS 0.1 02/05/2022 1524  ? ? ?BMET ?   ?Component Value Date/Time  ? NA 137 02/07/2022 0124  ? K 4.2 02/07/2022 0124  ? CL 104 02/07/2022 0124  ? CO2 27 02/07/2022 0124  ? GLUCOSE 77 02/07/2022 0124  ? BUN 12 02/07/2022 0124  ? CREATININE 1.04 02/07/2022 0124  ? CALCIUM 9.1 02/07/2022 0124  ? GFRNONAA >60 02/07/2022 0124  ? GFRAA >60 01/12/2020 1610  ? ? ?INR ?   ?Component Value Date/Time  ? INR 1.1 02/06/2022 1530  ? ? ? ?Intake/Output Summary (Last 24 hours) at 02/07/2022 0655 ?Last data filed at 02/07/2022 0600 ?Gross per 24 hour  ?Intake 1226.47 ml  ?Output 1310 ml  ?Net -83.53 ml  ? ? ? ?Assessment/Plan:  71 y.o. male is s/p:  ?EVAR  ?1 Day Post-Op ? ? ?-pt with monophasic doppler signals right DP/PT and left PT/pero ?-abdominal pain completely resolved.   ?-foley removed this am and he has been able to void a small amount.  ?-he needs to ambulate ?-renal function normal after receiving IV contrast ?-f/u in 4 weeks with CTA protocol with Dr.  Myra Gianotti.   ?-discussed importance of smoking cessation with pt.  He states he would like to quit.  ?-home later today ? ? ?Doreatha Massed, PA-C ?[facility] ?[phone] ?02/07/2022 ?6:55 AM ? ?I agree with the above.  I seen and evaluated the patient.  He is status post endovascular aneurysm repair.  His abdominal pain has resolved.  If he can ambulate, tolerate oral intake, and voided, he can be discharged home today with follow-up in 4 weeks.  He will need a statin at discharge. ? ?Durene Cal ? ? ? ?

## 2022-02-07 NOTE — Discharge Summary (Signed)
?EVAR Discharge Summary ? ? ?Sean DaubAndrew R Phillips ?06-23-1951 71 y.o. male ? ?MRN: ?[id number] ? ?Admission Date: ?02/05/2022 ? ?Discharge Date: ?02/07/2022 ? ?Physician: ?Nada LibmanBrabham, Vance W, MD ? ?Admission Diagnosis: ?AAA (abdominal aortic aneurysm) (HCC) [I71.40] ?Periumbilical abdominal pain [R10.33] ?Enlarging abdominal aortic aneurysm (AAA) (HCC) [I71.40] ? ? ?HPI:   ?This is a 71 y.o. male who has been followed for an abdominal aortic aneurysm.  A year ago it measured approximately 5 cm.  The patient presented to the ER today with abdominal pain which had been present for 6 days.  He describes this as constant and just to the left of his umbilicus.  It has been getting worse with time.  He describes it as a burning type pain without radiation.  He has also had some nausea and intermittent diarrhea.  He presented [facility] earlier today and had an ultrasound that showed an increase in size of his aneurysm to 5.5 cm.  He then came to [facility] for further evaluation. ? 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  He is a Naval architecttruck driver.  He has a history of coronary artery disease, [facility].  He denies claudication symptoms.  He denies neurologic symptoms. ?  ? ?Hospital Course:  ?The patient was admitted to the hospital and taken to the operating room on 02/06/2022 and underwent: ?Procedure:   #1: Endovascular repair of symptomatic abdominal aorticaneurysm ?                      #2: Bilateral ultrasound-guided percutaneous access ?                      #3: Abdominal aortogram ?                      #4: Catheter in aorta x2   ? ?Findings: ?complete exclusion ? ?The pt tolerated the procedure well and was transported to the PACU in good condition.  ? ?By POD 1, pt doing well and symptoms completely resolved.  He has voided and tolerating intake of solids.  His renal function is normal after receiving IV contrast.   ? ? ?CBC ?   ?Component Value Date/Time  ? WBC  9.9 02/07/2022 0124  ? RBC 3.74 (L) 02/07/2022 0124  ? HGB 13.2 02/07/2022 0124  ? HCT 38.9 (L) 02/07/2022 0124  ? PLT 147 (L) 02/07/2022 0124  ? MCV 104.0 (H) 02/07/2022 0124  ? MCH 35.3 (H) 02/07/2022 0124  ? MCHC 33.9 02/07/2022 0124  ? RDW 13.5 02/07/2022 0124  ? LYMPHSABS 2.2 02/05/2022 1524  ? MONOABS 0.7 02/05/2022 1524  ? EOSABS 0.1 02/05/2022 1524  ? BASOSABS 0.1 02/05/2022 1524  ? ? ?BMET ?   ?Component Value Date/Time  ? NA 137 02/07/2022 0124  ? K 4.2 02/07/2022 0124  ? CL 104 02/07/2022 0124  ? CO2 27 02/07/2022 0124  ? GLUCOSE 77 02/07/2022 0124  ? BUN 12 02/07/2022 0124  ? CREATININE 1.04 02/07/2022 0124  ? CALCIUM 9.1 02/07/2022 0124  ? GFRNONAA >60 02/07/2022 0124  ? GFRAA >60 01/12/2020 1610  ? ? ? ? ? ?Discharge Instructions   ? ? Discharge patient   Complete by: As directed ?  ? Discharge pt after he has walked in hallways and tolerated diet and voided.  ? Discharge disposition: 01-Home or Self Care  ? Discharge patient date: 02/07/2022  ? ?  ? ? ?  Discharge Diagnosis:  ?AAA (abdominal aortic aneurysm) (HCC) [I71.40] ?Periumbilical abdominal pain [R10.33] ?Enlarging abdominal aortic aneurysm (AAA) (HCC) [I71.40] ? ?Secondary Diagnosis: ?Patient Active Problem List  ? Diagnosis Date Noted  ? AAA (abdominal aortic aneurysm) (HCC) 02/06/2022  ? Trigeminal neuralgia of right side of face 04/13/2020  ? Pulmonary nodule 08/14/2018  ? Unstable angina (HCC)   ? Gastroesophageal reflux disease   ? Nausea without vomiting   ? Elevated MCV 05/24/2017  ? COPD (chronic obstructive pulmonary disease) (HCC) 05/23/2017  ? Anxiety 05/23/2017  ? Angina, class II (HCC) 11/11/2016  ? Abnormal nuclear stress test 11/11/2016  ? Chest pain, non-cardiac 10/09/2016  ? Coronary artery disease involving native heart with angina pectoris (HCC) 10/09/2016  ? Tobacco abuse 10/09/2016  ? Pure hypercholesterolemia 03/21/2015  ? S/P CABG (coronary artery bypass graft) 03/13/2015  ? AAA (abdominal aortic aneurysm) without rupture  (HCC) 03/01/2014  ? ?Past Medical History:  ?Diagnosis Date  ? AAA (abdominal aortic aneurysm) (HCC)   ? Anxiety   ? Arthritis   ? COPD (chronic obstructive pulmonary disease) (HCC)   ? Coronary artery disease involving native coronary artery with angina pectoris (HCC) 01/2015  ? 100% LAD, Severe dom RCA dz, small non-dom Cx --> CABG x 2 LIMA-LAD, SVG-RCA  ? MI (myocardial infarction) (HCC)   ? Pulmonary nodule   ? Benign  ? Status post tricuspid valve repair   ? ? ? ?Allergies as of 02/07/2022   ?No Known Allergies ?  ? ?  ?Medication List  ?  ? ?TAKE these medications   ? ?albuterol 108 (90 Base) MCG/ACT inhaler ?Commonly known as: VENTOLIN HFA ?Inhale 1-2 puffs into the lungs every 6 (six) hours as needed for wheezing or shortness of breath. ?  ?ALPRAZolam 1 MG tablet ?Commonly known as: Prudy Feeler ?Take 1 mg by mouth 4 (four) times daily as needed for anxiety. ?  ?aspirin EC 81 MG tablet ?Take 81 mg by mouth daily. ?  ?Centrum Men Tabs ?Take 1 tablet by mouth daily. ?  ?HYDROcodone-acetaminophen 7.5-325 MG tablet ?Commonly known as: NORCO ?Take 1 tablet by mouth every 6 (six) hours as needed for moderate pain. ?  ?meclizine 25 MG tablet ?Commonly known as: ANTIVERT ?Take 1 tablet (25 mg total) by mouth 3 (three) times daily as needed for dizziness. ?  ?nitroGLYCERIN 0.4 MG SL tablet ?Commonly known as: NITROSTAT ?PLACE 1 TABLET UNDER THE TONGUE EVERY 5 MINUTES AS NEEDED FOR CHEST PAIN ?What changed: See the new instructions. ?  ?omeprazole 40 MG capsule ?Commonly known as: PRILOSEC ?Take 40 mg by mouth daily as needed (heartburn). ?  ?pantoprazole 40 MG tablet ?Commonly known as: PROTONIX ?Take 1 tablet (40 mg total) by mouth 2 (two) times daily before a meal. ?  ?rosuvastatin 10 MG tablet ?Commonly known as: Crestor ?Take 1 tablet (10 mg total) by mouth daily. ?  ?vitamin B-12 1000 MCG tablet ?Commonly known as: CYANOCOBALAMIN ?Take 1,000 mcg by mouth daily. ?  ? ?  ? ? ?Discharge Instructions: ? ?[facility] [place]  ?Discharge Instructions Endovascular Aortic Aneurysm Repair ? ?Please refer to the following instructions for your post-procedure care. Your surgeon or Physician Assistant will discuss any changes with you. ? ?Activity ? ?You are encouraged to walk as much as you can. You can slowly return to normal activities but must avoid strenuous activity and heavy lifting until your doctor tells you it's OK. Avoid activities such as vacuuming or swinging a gold club. It  is normal to feel tired for several weeks after your surgery. Do not drive until your doctor gives the OK and you are no longer taking prescription pain medications. It is also normal to have difficulty with sleep habits, eating, and bowel movements after surgery. These will go away with time. ? ?Bathing/Showering ? ?You may shower after you go home. If you have an incision, do not soak in a bathtub, hot tub, or swim until the incision heals completely. ? ?Incision Care ? ?Shower every day. Clean your incision with mild soap and water. Pat the area dry with a clean towel. You do not need a bandage unless otherwise instructed. Do not apply any ointments or creams to your incision. If you clothing is irritating, you may cover your incision with a dry gauze pad. ? ?Diet ? ?Resume your normal diet. There are no special food restrictions following this procedure. A low fat/low cholesterol diet is recommended for all patients with vascular disease. In order to heal from your surgery, it is CRITICAL to get adequate nutrition. Your body requires vitamins, minerals, and protein. Vegetables are the best source of vitamins and minerals. Vegetables also provide the perfect balance of protein. Processed food has little nutritional value, so try to avoid this. ? ?Medications ? ?Resume taking all of your medications unless your doctor or Physician Assistnat tells you not to. If your incision is causing pain, you may take over-the-counter pain  relievers such as acetaminophen (Tylenol). If you were prescribed a stronger pain medication, please be aware these medications can cause nausea and constipation. Prevent nausea by taking the medication with

## 2022-02-07 NOTE — Anesthesia Postprocedure Evaluation (Signed)
Anesthesia Post Note ? ?Patient: [name] ? ?Procedure(Phillips) Performed: ENDOVASUCLAR REPAIR OF ABDOMINAL AORTIC  ANEURSYM ?ULTRASOUND GUIDANCE FOR VASCULAR ACCESS ? ?  ? ?Patient location during evaluation: PACU ?Anesthesia Type: General ?Level of consciousness: awake and alert ?Pain management: pain level controlled ?Vital Signs Assessment: post-procedure vital signs reviewed and stable ?Respiratory status: spontaneous breathing, nonlabored ventilation, respiratory function stable and patient connected to nasal cannula oxygen ?Cardiovascular status: blood pressure returned to baseline and stable ?Postop Assessment: no apparent nausea or vomiting ?Anesthetic complications: no ? ? ?No notable events documented. ? ?Last Vitals:  ?Vitals:  ? 02/07/22 0434 02/07/22 0804  ?BP: (!) 103/49 114/61  ?Pulse: 81 92  ?Resp: 18 17  ?Temp: 36.7 ?C 36.5 ?C  ?SpO2: 94% 96%  ?  ?Last Pain:  ?Vitals:  ? 02/07/22 1200  ?TempSrc:   ?PainSc: 10-Worst pain ever  ? ? ?  ?  ?  ?  ?  ?  ? ?[name] ? ? ? ? ?

## 2022-02-07 NOTE — Discharge Instructions (Signed)
  [facility]   Discharge Instructions  Endovascular Aortic Aneurysm Repair  Please refer to the following instructions for your post-procedure care. Your surgeon or Physician Assistant will discuss any changes with you.  Activity  You are encouraged to walk as much as you can. You can slowly return to normal activities but must avoid strenuous activity and heavy lifting until your doctor tells you it's OK. Avoid activities such as vacuuming or swinging a gold club. It is normal to feel tired for several weeks after your surgery. Do not drive until your doctor gives the OK and you are no longer taking prescription pain medications. It is also normal to have difficulty with sleep habits, eating, and bowel movements after surgery. These will go away with time.  Bathing/Showering  Shower daily after you go home.  Do not soak in a bathtub, hot tub, or swim until the incision heals completely.  If you have incisions in your groin, wash the groin wounds with soap and water daily and pat dry. (No tub bath-only shower)  Then put a dry gauze or washcloth there to keep this area dry to help prevent wound infection daily and as needed.  Do not use Vaseline or neosporin on your incisions.  Only use soap and water on your incisions and then protect and keep dry.  Incision Care  Shower every day. Clean your incision with mild soap and water. Pat the area dry with a clean towel. You do not need a bandage unless otherwise instructed. Do not apply any ointments or creams to your incision. If you clothing is irritating, you may cover your incision with a dry gauze pad.  Diet  Resume your normal diet. There are no special food restrictions following this procedure. A low fat/low cholesterol diet is recommended for all patients with vascular disease. In order to heal from your surgery, it is CRITICAL to get adequate nutrition. Your body requires vitamins, minerals, and protein.  Vegetables are the best source of vitamins and minerals. Vegetables also provide the perfect balance of protein. Processed food has little nutritional value, so try to avoid this.  Medications  Resume taking all of your medications unless your doctor or nurse practitioner tells you not to. If your incision is causing pain, you may take over-the-counter pain relievers such as acetaminophen (Tylenol). If you were prescribed a stronger pain medication, please be aware these medications can cause nausea and constipation. Prevent nausea by taking the medication with a snack or meal. Avoid constipation by drinking plenty of fluids and eating foods with a high amount of fiber, such as fruits, vegetables, and grains.  Do not take Tylenol if you are taking prescription pain medications.   Follow up  Our office will schedule a follow-up appointment with a CT scan 3-4 weeks after your surgery.  Please call us immediately for any of the following conditions  Severe or worsening pain in your legs or feet or in your abdomen back or chest. Increased pain, redness, drainage (pus) from your incision site. Increased abdominal pain, bloating, nausea, vomiting or persistent diarrhea. Fever of 101 degrees or higher. Swelling in your leg (s),  Reduce your risk of vascular disease  Stop smoking. If you would like help call [facility] at 1-800-QUIT-NOW (1-800-784-8669) or [place] at 336-586-4000. Manage your cholesterol Maintain a desired weight Control your diabetes Keep your blood pressure down  If you have questions, please call the office at [phone].  

## 2022-02-27 ENCOUNTER — Ambulatory Visit: Payer: Medicare Other | Admitting: Cardiology

## 2022-03-11 ENCOUNTER — Encounter: Payer: Medicare HMO | Admitting: Surgery

## 2022-03-22 ENCOUNTER — Other Ambulatory Visit: Payer: Self-pay

## 2022-03-22 DIAGNOSIS — I714 Abdominal aortic aneurysm, without rupture, unspecified: Secondary | ICD-10-CM

## 2022-03-25 ENCOUNTER — Encounter: Payer: Medicare HMO | Admitting: Surgery

## 2022-03-27 ENCOUNTER — Ambulatory Visit (HOSPITAL_COMMUNITY): Payer: Medicare HMO

## 2022-03-27 ENCOUNTER — Other Ambulatory Visit (HOSPITAL_COMMUNITY): Payer: Medicare HMO

## 2022-04-02 ENCOUNTER — Ambulatory Visit (HOSPITAL_COMMUNITY): Payer: Medicare HMO

## 2022-04-02 ENCOUNTER — Telehealth: Payer: Self-pay

## 2022-04-02 ENCOUNTER — Ambulatory Visit (HOSPITAL_COMMUNITY)
Admission: RE | Admit: 2022-04-02 | Discharge: 2022-04-02 | Disposition: A | Discharge status: Home / Self Care | Payer: Medicare HMO | Source: Ambulatory Visit | Attending: Surgery | Admitting: Surgery

## 2022-04-02 DIAGNOSIS — I714 Abdominal aortic aneurysm, without rupture, unspecified: Secondary | ICD-10-CM | POA: Insufficient documentation

## 2022-04-02 IMAGING — CT CT ANGIO CHEST
2 of 10 series · 14 of 46 positions shown · IV contrast (APPLIED)
Comparison: Prior CTA chest, abdomen and pelvis [DATE]

CLINICAL DATA: History of abdominal aortic aneurysm status ATAZHAN
ATAZHAN 3 weeks previously.

EXAM:
CT ANGIOGRAPHY CHEST, ABDOMEN AND PELVIS
TECHNIQUE: Non-contrast CT of the chest was initially obtained.

[Series 6: axial arterial · axial · arterial · 0.69mm/px · z∈[+1122,+1665]mm · 12 of 215 slices shown]
[im 17/215  lung]
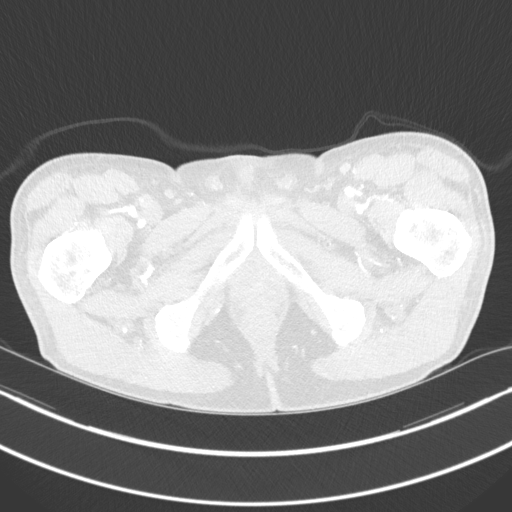
[im 33/215  soft-tissue]
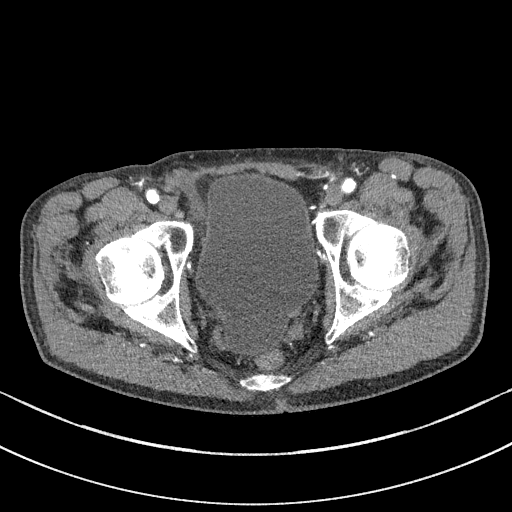
[im 50/215  lung]
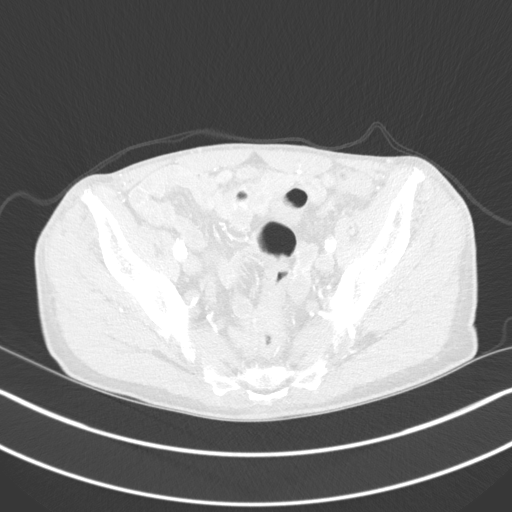
[im 66/215  soft-tissue]
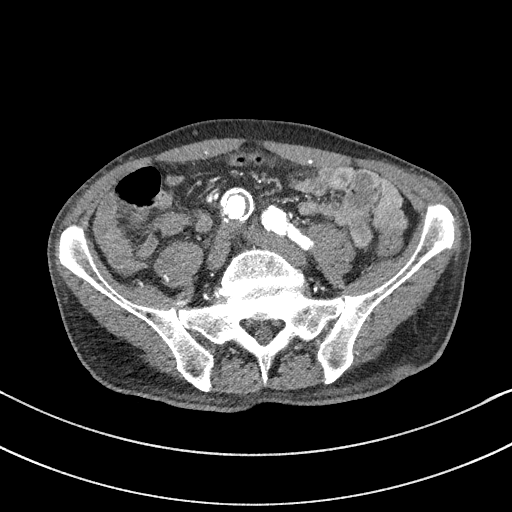
[im 83/215  lung]
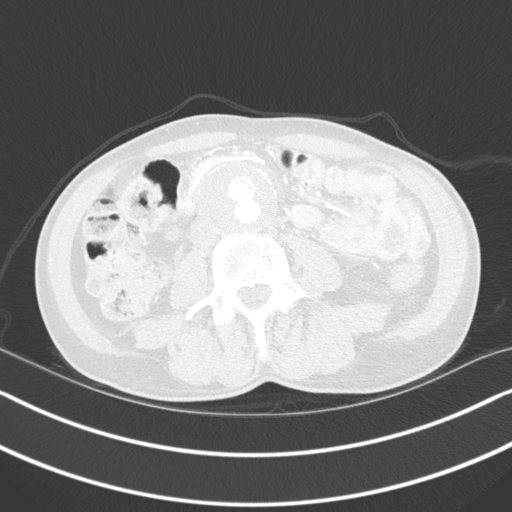
[im 99/215  soft-tissue]
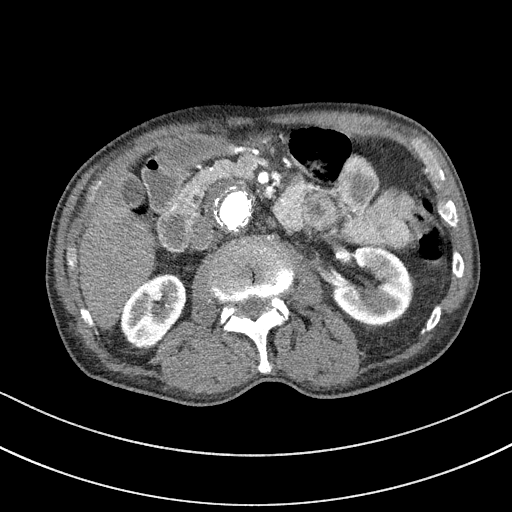
[im 116/215  lung]
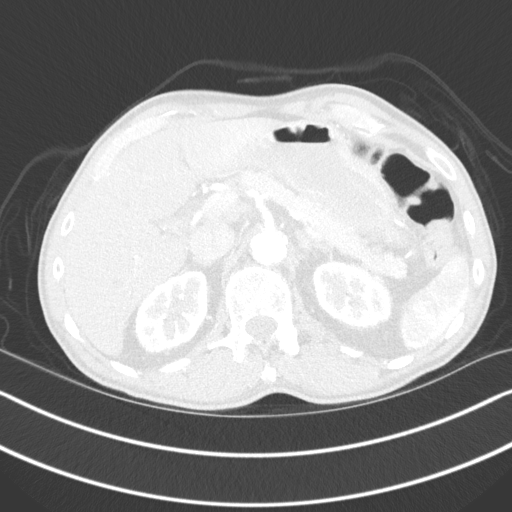
[im 132/215  soft-tissue]
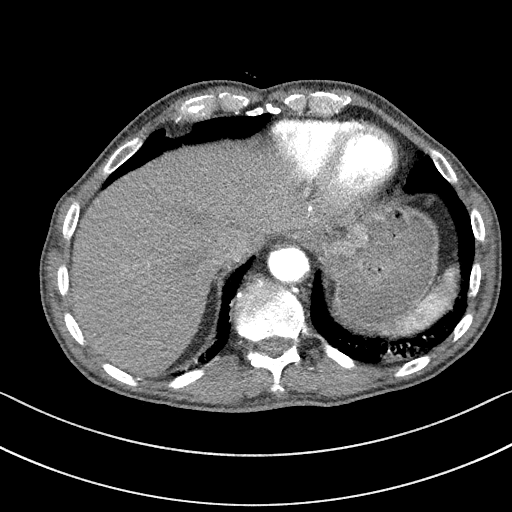
[im 149/215  lung]
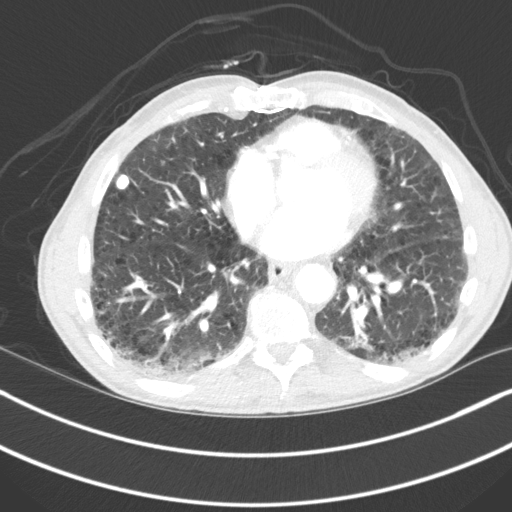
[im 165/215  soft-tissue]
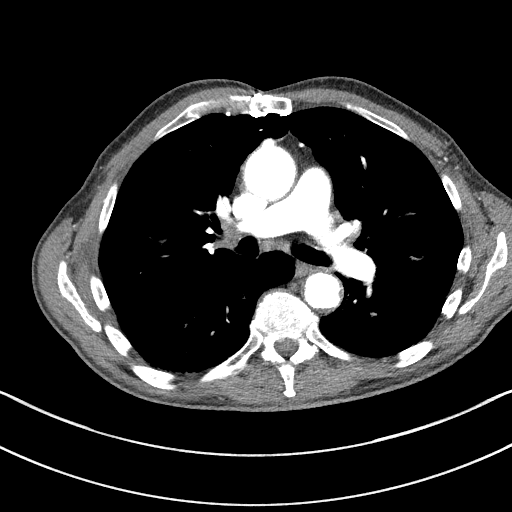
[im 182/215  lung]
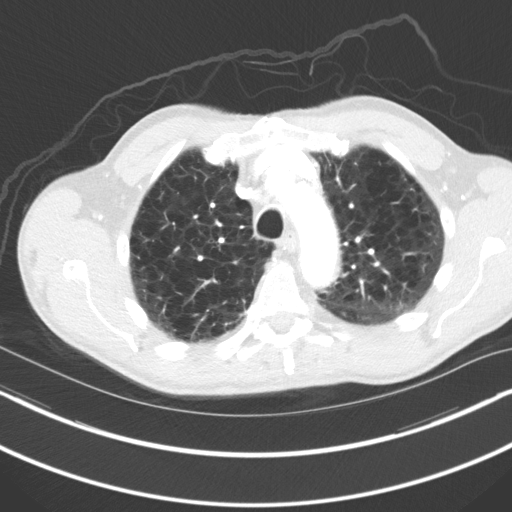
[im 198/215  soft-tissue]
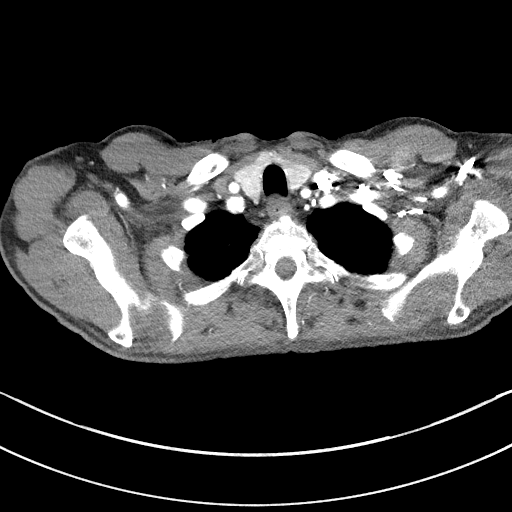

[Series 9: coronals · coronal · 0.68mm/px · 2 of 126 slices shown]
[im 42/126  soft-tissue]
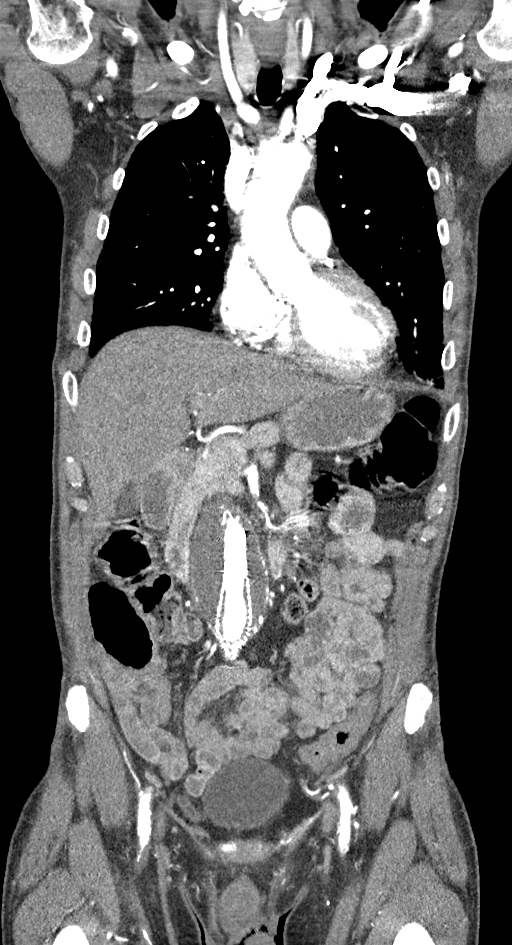
[im 84/126  soft-tissue]
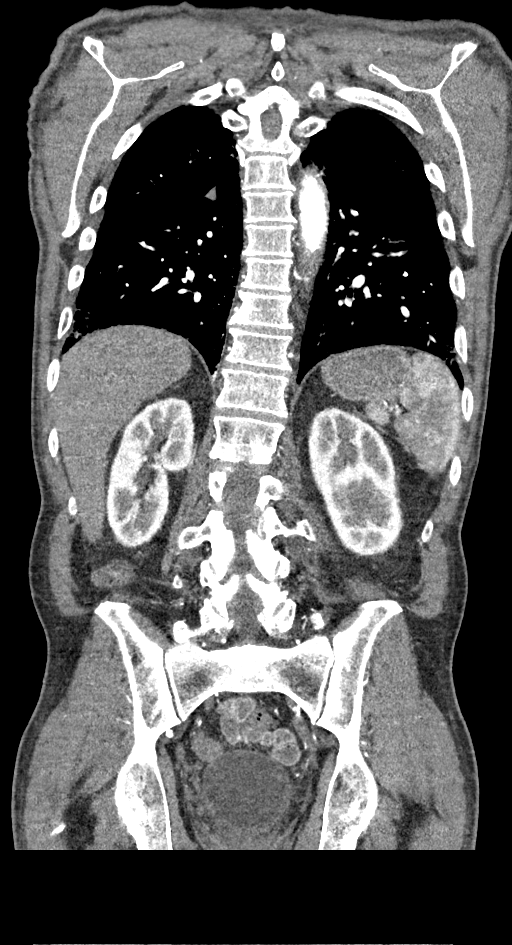

[14 of 46 positions shown; findings below may reference images not displayed]

Multidetector CT imaging through the chest, abdomen and pelvis was
performed using the standard protocol during bolus administration of
intravenous contrast. Multiplanar reconstructed images and MIPs were
obtained and reviewed to evaluate the vascular anatomy.

RADIATION DOSE REDUCTION: This exam was performed according to the
departmental dose-optimization program which includes automated
exposure control, adjustment of the mA and/or kV according to
patient size and/or use of iterative reconstruction technique.

CONTRAST:  100mL OMNIPAQUE IOHEXOL 350 MG/ML SOLN
FINDINGS: CTA CHEST FINDINGS

Cardiovascular: Preferential opacification of the thoracic aorta. No
evidence of thoracic aortic aneurysm or dissection. Normal heart
size. No pericardial effusion. Surgical changes of multivessel CABG.
Atherosclerotic calcifications present in the native coronary
arteries. Tricuspid valve replacement prosthesis in place.

Mediastinum/Nodes: No enlarged mediastinal, hilar, or axillary lymph
nodes. Thyroid gland, trachea, and esophagus demonstrate no
significant findings.

Lungs/Pleura: Advanced centrilobular pulmonary emphysema. Large
calcified granuloma in the periphery of the right middle lobe. Right
apical pleuroparenchymal scarring with a nodular component that
appears more pronounced compared to more remote prior imaging from
[7M]. On the present study, the maximal diameter of the nodular
component is 1.3 x 0.6 cm (image 33 series 7). Stable subpleural
nodular opacity within the upper aspect of the right major fissure
at 0.9 cm dating back to [7M]. Small calcified subpleural granuloma
along the minor fissure. Dependent atelectasis in both lower lobes.

Musculoskeletal: No acute fracture or aggressive appearing lytic or
blastic osseous lesion.

Review of the MIP images confirms the above findings.

CTA ABDOMEN AND PELVIS FINDINGS

VASCULAR

Aorta: Surgical changes of endovascular aortic repair of infrarenal
abdominal aortic aneurysm. The stent graft extends from just below
the renal arteries into the bilateral common iliac arteries. The
excluded aneurysm sac measures 5.3 x 4.8 cm which is essentially
unchanged compared to prior. No endoleak identified. Small amount of
circumferential wall adherent thrombus present within the right
iliac limb.

Celiac: Patent without evidence of aneurysm, dissection, vasculitis
or significant stenosis.

SMA: Patent without evidence of aneurysm, dissection, vasculitis or
significant stenosis.

Renals: Solitary renal arteries bilaterally. Advanced
atherosclerotic plaque in the proximal right renal artery results in
at least moderate stenosis. The left renal artery is widely patent.

IMA: Occluded at the origin but reconstitutes via collateral flow.

Inflow: Extensive atherosclerotic plaque bilaterally. No dissection,
occlusion or hemodynamically significant stenosis. Chronic occlusion
of the right superficial femoral artery again noted.

Veins: No focal venous abnormality.

Review of the MIP images confirms the above findings.

NON-VASCULAR

Hepatobiliary: No focal liver abnormality is seen. No gallstones,
gallbladder wall thickening, or biliary dilatation.

Pancreas: Unremarkable. No pancreatic ductal dilatation or
surrounding inflammatory changes.

Spleen: Normal in size without focal abnormality.

Adrenals/Urinary Tract: Adrenal glands are unremarkable. Kidneys are
normal, without renal calculi, focal lesion, or hydronephrosis.
Bladder is unremarkable.

Stomach/Bowel: No focal bowel wall thickening or evidence of
obstruction.

Lymphatic: No suspicious lymphadenopathy.

Reproductive: Prostate is unremarkable.

Other: No abdominal wall hernia or abnormality. No abdominopelvic
ascites.

Musculoskeletal: No acute fracture or aggressive appearing lytic or
blastic osseous lesion. Chronic bilateral L5 pars defects. Mild
grade 1 anterolisthesis of L4 on L5.

Review of the MIP images confirms the above findings.
IMPRESSION: 1. Successful endovascular aortic repair of infrarenal abdominal
aortic aneurysm without evidence of endoleak. The excluded aneurysm
sac remains stable at 5.3 cm. There is some mild wall adherent
thrombus along the margins of the right iliac limb.
2. Stable chronic occlusion of the right superficial femoral artery.
3. Right upper lobe pulmonary nodule measuring up to 1.3 cm again
noted. As previously stated on [DATE], this lesion remains
suspicious for possible bronchogenic carcinoma. PET-CT should be
considered for further evaluation.
4. Chronic bilateral L5 pars defects with grade 1 anterolisthesis of
L4 on L5.
5. Additional ancillary findings as above without significant
interval change.

## 2022-04-02 IMAGING — CT CT CTA ABD/PEL W/CM AND/OR W/O CM
2 of 9 series · 12 of 46 positions shown, 14 images · non-contrast
Comparison: Prior CTA chest, abdomen and pelvis [DATE]

CLINICAL DATA: History of abdominal aortic aneurysm status ATAZHAN
ATAZHAN 3 weeks previously.

EXAM:
CT ANGIOGRAPHY CHEST, ABDOMEN AND PELVIS
TECHNIQUE: Non-contrast CT of the chest was initially obtained.

[Series 6: axial arterial · axial · arterial · 0.69mm/px · z∈[+1122,+1665]mm · 9 of 215 slices shown, 11 images]
[im 17/215  soft-tissue]
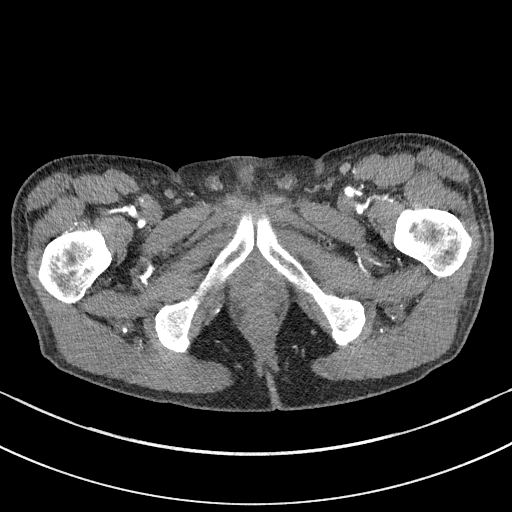
[im 17/215  bone]
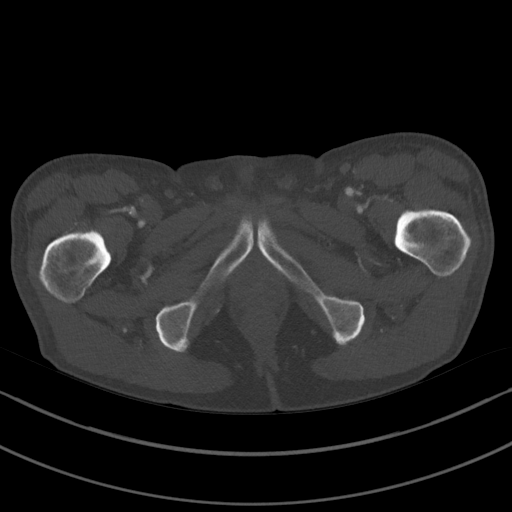
[im 50/215  soft-tissue]
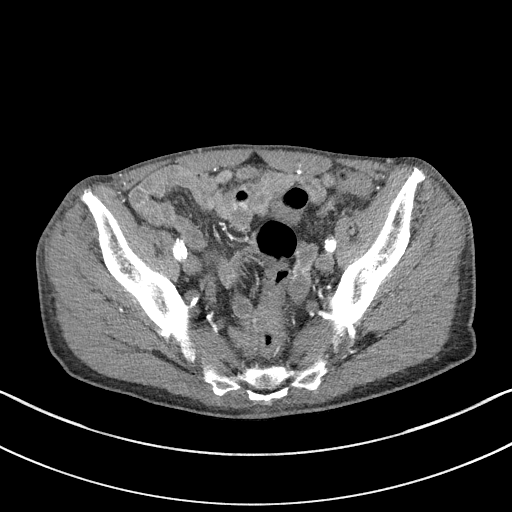
[im 66/215  soft-tissue]
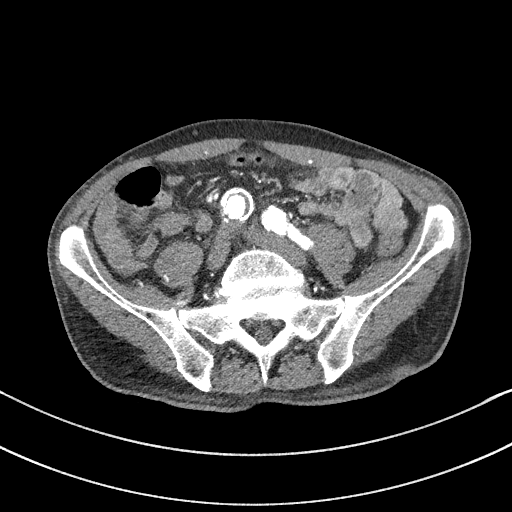
[im 83/215  soft-tissue]
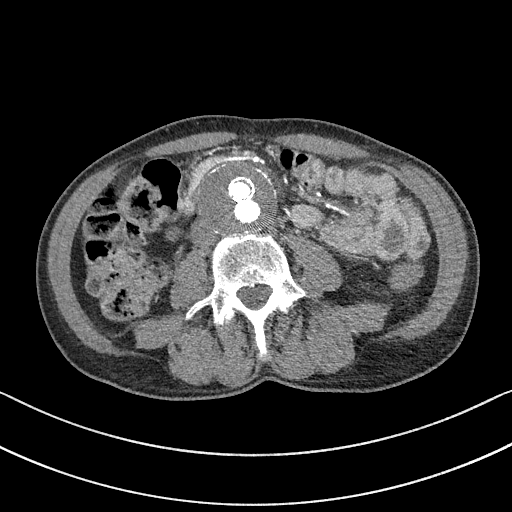
[im 116/215  soft-tissue]
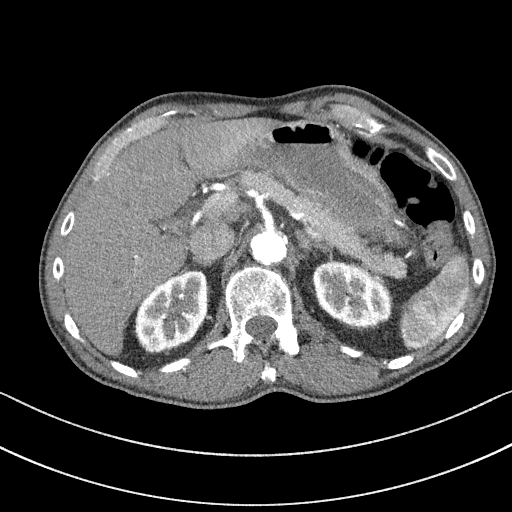
[im 132/215  soft-tissue]
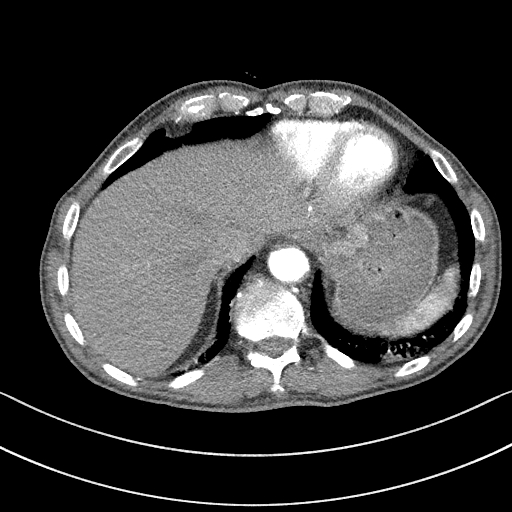
[im 149/215  soft-tissue]
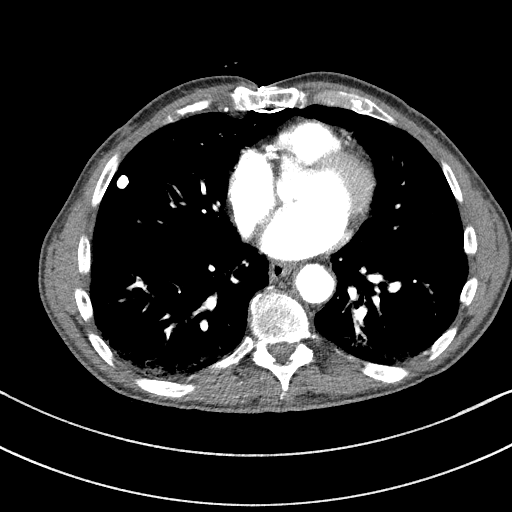
[im 182/215  soft-tissue]
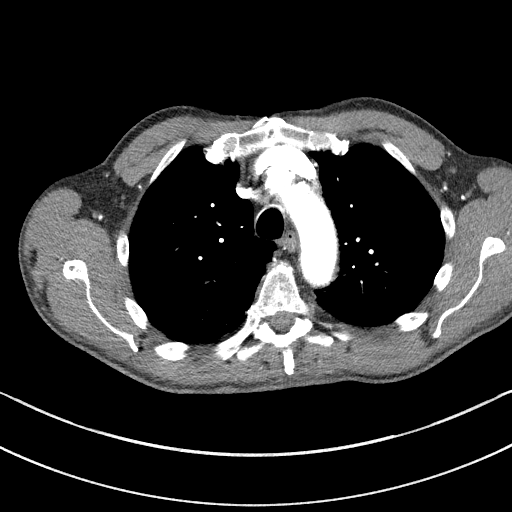
[im 198/215  soft-tissue]
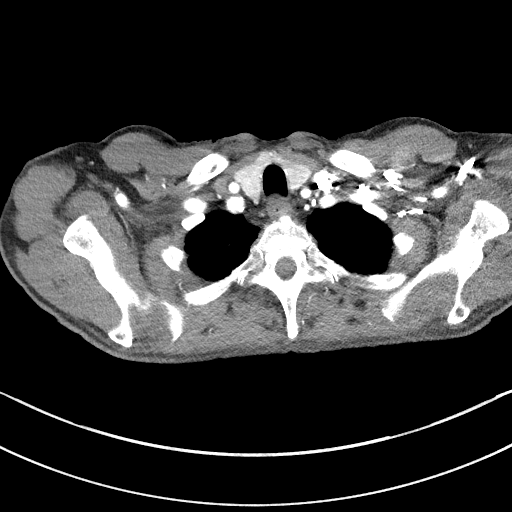
[im 198/215  bone]
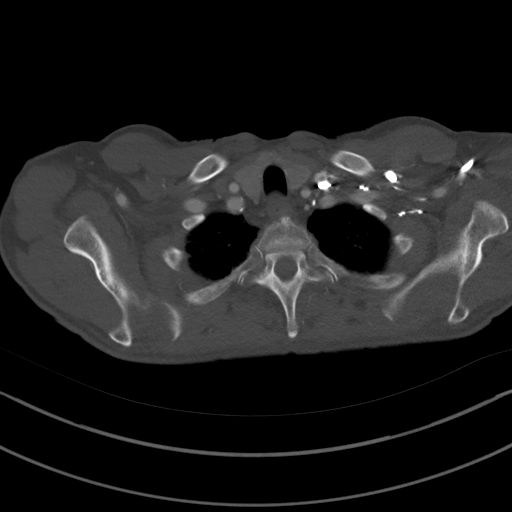

[Series 9: coronals · coronal · 0.68mm/px · 3 of 126 slices shown]
[im 32/126  soft-tissue]
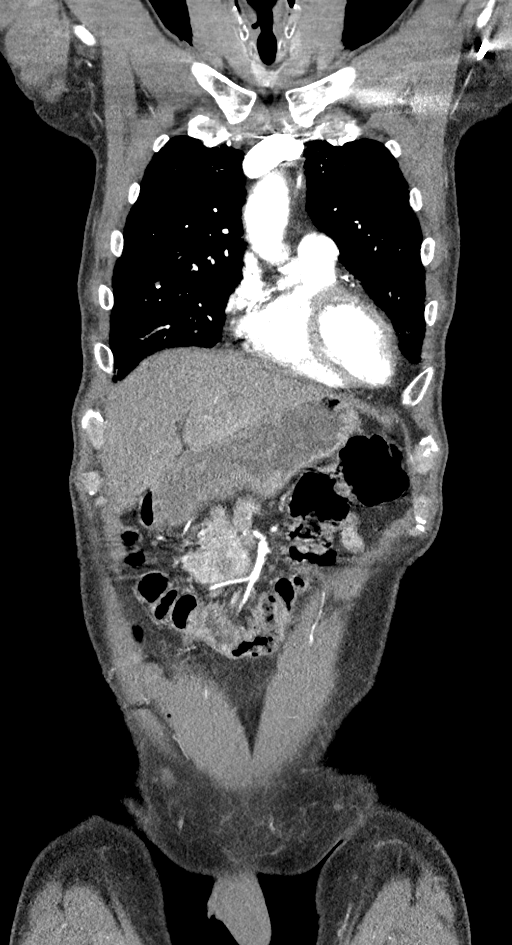
[im 63/126  soft-tissue]
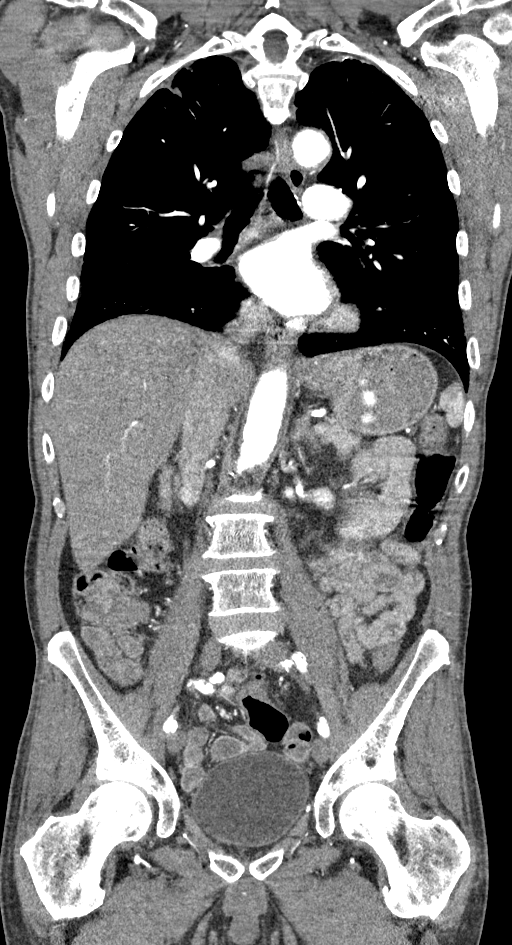
[im 94/126  soft-tissue]
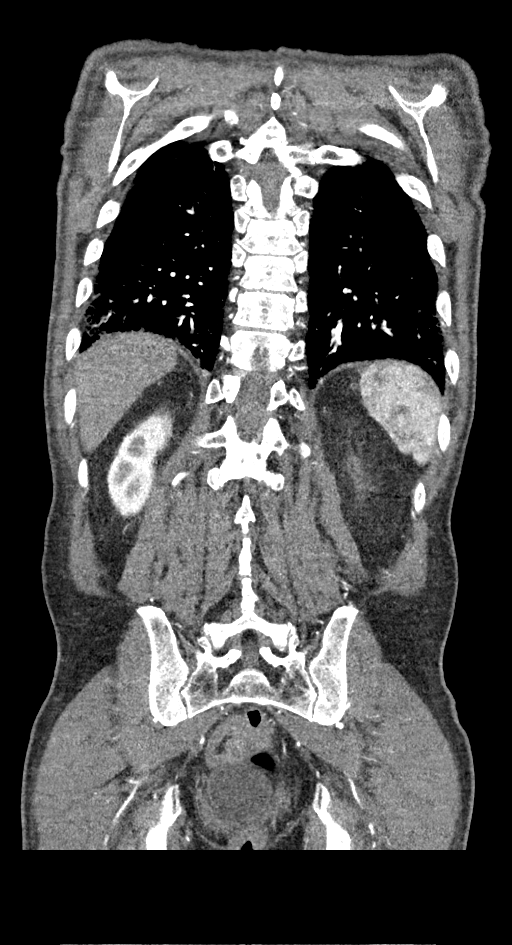

[12 of 46 positions shown; findings below may reference images not displayed]

Multidetector CT imaging through the chest, abdomen and pelvis was
performed using the standard protocol during bolus administration of
intravenous contrast. Multiplanar reconstructed images and MIPs were
obtained and reviewed to evaluate the vascular anatomy.

RADIATION DOSE REDUCTION: This exam was performed according to the
departmental dose-optimization program which includes automated
exposure control, adjustment of the mA and/or kV according to
patient size and/or use of iterative reconstruction technique.

CONTRAST:  100mL OMNIPAQUE IOHEXOL 350 MG/ML SOLN
FINDINGS: CTA CHEST FINDINGS

Cardiovascular: Preferential opacification of the thoracic aorta. No
evidence of thoracic aortic aneurysm or dissection. Normal heart
size. No pericardial effusion. Surgical changes of multivessel CABG.
Atherosclerotic calcifications present in the native coronary
arteries. Tricuspid valve replacement prosthesis in place.

Mediastinum/Nodes: No enlarged mediastinal, hilar, or axillary lymph
nodes. Thyroid gland, trachea, and esophagus demonstrate no
significant findings.

Lungs/Pleura: Advanced centrilobular pulmonary emphysema. Large
calcified granuloma in the periphery of the right middle lobe. Right
apical pleuroparenchymal scarring with a nodular component that
appears more pronounced compared to more remote prior imaging from
[7M]. On the present study, the maximal diameter of the nodular
component is 1.3 x 0.6 cm (image 33 series 7). Stable subpleural
nodular opacity within the upper aspect of the right major fissure
at 0.9 cm dating back to [7M]. Small calcified subpleural granuloma
along the minor fissure. Dependent atelectasis in both lower lobes.

Musculoskeletal: No acute fracture or aggressive appearing lytic or
blastic osseous lesion.

Review of the MIP images confirms the above findings.

CTA ABDOMEN AND PELVIS FINDINGS

VASCULAR

Aorta: Surgical changes of endovascular aortic repair of infrarenal
abdominal aortic aneurysm. The stent graft extends from just below
the renal arteries into the bilateral common iliac arteries. The
excluded aneurysm sac measures 5.3 x 4.8 cm which is essentially
unchanged compared to prior. No endoleak identified. Small amount of
circumferential wall adherent thrombus present within the right
iliac limb.

Celiac: Patent without evidence of aneurysm, dissection, vasculitis
or significant stenosis.

SMA: Patent without evidence of aneurysm, dissection, vasculitis or
significant stenosis.

Renals: Solitary renal arteries bilaterally. Advanced
atherosclerotic plaque in the proximal right renal artery results in
at least moderate stenosis. The left renal artery is widely patent.

IMA: Occluded at the origin but reconstitutes via collateral flow.

Inflow: Extensive atherosclerotic plaque bilaterally. No dissection,
occlusion or hemodynamically significant stenosis. Chronic occlusion
of the right superficial femoral artery again noted.

Veins: No focal venous abnormality.

Review of the MIP images confirms the above findings.

NON-VASCULAR

Hepatobiliary: No focal liver abnormality is seen. No gallstones,
gallbladder wall thickening, or biliary dilatation.

Pancreas: Unremarkable. No pancreatic ductal dilatation or
surrounding inflammatory changes.

Spleen: Normal in size without focal abnormality.

Adrenals/Urinary Tract: Adrenal glands are unremarkable. Kidneys are
normal, without renal calculi, focal lesion, or hydronephrosis.
Bladder is unremarkable.

Stomach/Bowel: No focal bowel wall thickening or evidence of
obstruction.

Lymphatic: No suspicious lymphadenopathy.

Reproductive: Prostate is unremarkable.

Other: No abdominal wall hernia or abnormality. No abdominopelvic
ascites.

Musculoskeletal: No acute fracture or aggressive appearing lytic or
blastic osseous lesion. Chronic bilateral L5 pars defects. Mild
grade 1 anterolisthesis of L4 on L5.

Review of the MIP images confirms the above findings.
IMPRESSION: 1. Successful endovascular aortic repair of infrarenal abdominal
aortic aneurysm without evidence of endoleak. The excluded aneurysm
sac remains stable at 5.3 cm. There is some mild wall adherent
thrombus along the margins of the right iliac limb.
2. Stable chronic occlusion of the right superficial femoral artery.
3. Right upper lobe pulmonary nodule measuring up to 1.3 cm again
noted. As previously stated on [DATE], this lesion remains
suspicious for possible bronchogenic carcinoma. PET-CT should be
considered for further evaluation.
4. Chronic bilateral L5 pars defects with grade 1 anterolisthesis of
L4 on L5.
5. Additional ancillary findings as above without significant
interval change.

## 2022-04-02 MED ORDER — IOHEXOL 350 MG/ML SOLN
100.0000 mL | Freq: Once | INTRAVENOUS | Status: AC | PRN
  Administered 2022-04-02: 100 mL via INTRAVENOUS

## 2022-04-02 NOTE — Telephone Encounter (Signed)
Pt called wanting to go back to work.  Returned pt's call, two identifiers used. Informed pt that he would need to wait to determine return to work at his 6/26 visit with the provider. Pt confirmed understanding.

## 2022-04-15 ENCOUNTER — Encounter: Payer: Medicare HMO | Admitting: Surgery

## 2022-04-19 ENCOUNTER — Encounter: Payer: Self-pay | Admitting: Surgery

## 2022-04-19 ENCOUNTER — Ambulatory Visit (INDEPENDENT_AMBULATORY_CARE_PROVIDER_SITE_OTHER): Payer: Medicare HMO | Admitting: Surgery

## 2022-04-19 VITALS — BP 128/81 | HR 79 | Temp 98.9°F | Resp 18 | Ht 67.0 in | Wt 135.0 lb

## 2022-04-19 DIAGNOSIS — I7143 Infrarenal abdominal aortic aneurysm, without rupture: Secondary | ICD-10-CM

## 2022-04-19 NOTE — Progress Notes (Signed)
Patient name: [name] MRN: [id number] DOB: 1951-05-26 Sex: male  REASON FOR VISIT:     Post op  HISTORY OF PRESENT ILLNESS:   [name] is a 71 y.o. male who presented to the emergency department with abdominal pain.  He was found to have a 5.5 cm aneurysm.  Therefore he was taken to the operating room on 02/06/2022.  His abdominal pain resolved after aneurysm repair.  The patient suffers from COPD and emphysema secondary to chronic tobacco abuse.  He is a [employer].  He has a history of coronary artery disease, status post CABG in [facility].  He denies claudication symptoms.  He denies neurologic symptoms.    CURRENT MEDICATIONS:    Current Outpatient Medications  Medication Sig Dispense Refill   albuterol (VENTOLIN HFA) 108 (90 Base) MCG/ACT inhaler Inhale 1-2 puffs into the lungs every 6 (six) hours as needed for wheezing or shortness of breath. 18 g 0   ALPRAZolam (XANAX) 1 MG tablet Take 1 mg by mouth 4 (four) times daily as needed for anxiety.   2   aspirin EC 81 MG tablet Take 81 mg by mouth daily.      HYDROcodone-acetaminophen (NORCO) 7.5-325 MG tablet Take 1 tablet by mouth every 6 (six) hours as needed for moderate pain. 8 tablet 0   meclizine (ANTIVERT) 25 MG tablet Take 1 tablet (25 mg total) by mouth 3 (three) times daily as needed for dizziness. 30 tablet 0   Multiple Vitamins-Minerals (CENTRUM MEN) TABS Take 1 tablet by mouth daily.     nitroGLYCERIN (NITROSTAT) 0.4 MG SL tablet PLACE 1 TABLET UNDER THE TONGUE EVERY 5 MINUTES AS NEEDED FOR CHEST PAIN (Patient taking differently: Place 0.4 mg under the tongue every 5 (five) minutes as needed for chest pain.) 100 tablet 0   omeprazole (PRILOSEC) 40 MG capsule Take 40 mg by mouth daily as needed (heartburn).     rosuvastatin (CRESTOR) 10 MG tablet Take 1 tablet (10 mg total) by mouth daily. 30 tablet 11   vitamin B-12 (CYANOCOBALAMIN) 1000 MCG tablet Take 1,000 mcg by  mouth daily.     pantoprazole (PROTONIX) 40 MG tablet Take 1 tablet (40 mg total) by mouth 2 (two) times daily before a meal. (Patient not taking: Reported on 02/06/2022) 60 tablet 0   No current facility-administered medications for this visit.    REVIEW OF SYSTEMS:   [X]  denotes positive finding, [ ]  denotes negative finding Cardiac  Comments:  Chest pain or chest pressure:    Shortness of breath upon exertion:    Short of breath when lying flat:    Irregular heart rhythm:    Constitutional    Fever or chills:      PHYSICAL EXAM:   Vitals:   04/19/22 1437  BP: 128/81  Pulse: 79  Resp: 18  Temp: 98.9 F (37.2 C)  TempSrc: Oral  SpO2: 100%  Weight: 135 lb (61.2 kg)  Height: 5\' 7"  (1.702 m)    GENERAL: The patient is a well-nourished male, in no acute distress. The vital signs are documented above. CARDIOVASCULAR: There is a regular rate and rhythm. PULMONARY: Non-labored respirations   STUDIES:   CT angiogram:  1. Successful endovascular aortic repair of infrarenal abdominal aortic aneurysm without evidence of endoleak. The excluded aneurysm sac remains stable at 5.3 cm. There is some mild wall adherent thrombus along the margins of the right iliac limb. 2. 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  3. Right upper lobe pulmonary nodule measuring up to 1.3 cm again noted. As previously stated on 02/05/2022, this lesion remains suspicious for possible bronchogenic carcinoma. PET-CT should be considered for further evaluation. 4. Chronic bilateral L5 pars defects with grade 1 anterolisthesis of L4 on L5. 5. Additional ancillary findings as above without significant interval change.  AAA:  MEDICAL ISSUES:   AAA:  follow up in 6 months with EVAR duplex.  Note given to return to work without restrictions  Lung nodule:  discussed with patient, will refer to thoracic oncology for evaluation  Charlena Cross, MD, FACS [facility] [phone] Pager [phone]

## 2022-04-25 ENCOUNTER — Other Ambulatory Visit: Payer: Self-pay

## 2022-04-25 DIAGNOSIS — I714 Abdominal aortic aneurysm, without rupture, unspecified: Secondary | ICD-10-CM

## 2022-07-20 NOTE — Progress Notes (Unsigned)
Patient is a 71 year old male known to have CAD status post CABG in 2016 in [facility] with LVEF 45% and RWMA in the apical myocardium per 2018 echo, severe TR s/p tricuspid valve repair with 26 mm [employer] M3 annuloplasty ring at the time of CABG in 2016, abdominal aortic aneurysm status post endovascular repair in 2023 with no evidence of endoleak, COPD, chronic tobacco abuse, 1.3 cm lung nodule in the right upper lobe suspicious for bronchogenic carcinoma per CT report he is here for follow-up.  Patient was admitted to the hospital in 2018 with atypical chest pain underwent nuclear stress test that showed peri-infarct ischemia for which he underwent cath. Cath showed patent grafts, LIMA to LAD and SVG to distal RCA, patent nondominant left circumflex, known occluded proximal LAD and now occluded proximal to distal RCA. No PCI targets. He is currently on aspirin 81 mg and rosuvastatin 10 mg.   CATH in 2018 Ost LAD to Prox LAD lesion, 100 %stenosed. Short reconstitution from LIMA with Prox LAD to Mid LAD lesion, 95 %stenosed. LIMA-mLAD and is normal in caliber and anatomically normal. Dist-apical LAD lesion, 60 %stenosed. Prox RCA to Dist RCA lesion, 100 %stenosed. Seq SVG- dRCA and is large and anatomically normal - fills small rPDA & RPAV with 2 RPLs Patent non-dominant Circumflex with minimal CAD. _________________________________________ There is mild left ventricular systolic dysfunction. The left ventricular ejection fraction is 45-50% by visual estimate. LV end diastolic pressure is mildly elevated. Widely patent grafts and native circumflex with known occlusion of the LAD and now occluded RCA. No obvious culprit lesion to explain the patient's angina besides the short segment of the proximal LAD isolated by proximal occlusion and distal 95% stenosis. No PCI targets.  ECHO in 2018 - Left ventricle: The cavity size was normal. Wall thickness was    increased in a pattern  of mild LVH. Systolic function was mildly    reduced. The estimated ejection fraction was 45%.  - Regional wall motion abnormality: Moderate hypokinesis of the    mid-apical anterior, basal anteroseptal, basal-mid inferoseptal,    and apical septal myocardium; mild hypokinesis of the mid    anteroseptal and apical myocardium.  - Aortic valve: Mildly thickened, moderately calcified leaflets.    Morphologically, there may be very mild aortic stenosis.  - Mitral valve: Mildly calcified annulus. Normal thickness leaflets    . There was mild regurgitation.  - Left atrium: The atrium was mildly dilated.  - Right ventricle: Systolic function was mildly reduced.  - Tricuspid valve: S/p tricuspid valve repair using a 26 mm [employer] M3 annuloplasty ring. There was trivial    regurgitation.  - Pulmonary arteries: PA peak pressure: 35 mm Hg (S).  - Systemic veins: Dilated IVC with normal respiratory variation.    Estimated CVP 8 mmHg.   NM STRESS TEST in 2018 Blood pressure demonstrated a normal response to exercise. Defect 1: There is a medium defect of severe severity present in the basal anteroseptal, basal inferoseptal, basal inferior, mid inferoseptal, mid inferior and apical anterior location. Findings consistent with prior myocardial infarction with peri-infarct ischemia primarily in the anteroseptal wall and septum. This is an intermediate risk study. Nuclear stress EF: 46%.    Assessment and plan Patient is a 71 year old male known to have CAD status post CABG (patent LIMA to LAD and SVG to distal RCA) in 2016 with LVEF 45%, severe TR status post annuloplasty repair with residual minimal TR in  2016, AAA s/p endovascular repair in 2023 with no evidence of endoleak, HLD, COPD, chronic tobacco abuse is here for a follow-up visit.  #CAD status post CABG (patent LIMA to LAD and SVG to distal RCA) in 2016 with LVEF 45%, currently angina free Plan Continue aspirin 81  mg Increase rosuvastatin 10 mg to 20 mg QHS SL NTG 0.4mg  PRN.  No indication of further antianginal therapy.   #Hyperlipidemia Plan Increase rosuvastatin 10 mg to 20 mg nightly Obtain lipid panel   #Severe TR status post annuloplasty repair with residual minimal TR in 2016 Plan Currently asymptomatic. Patient will need infective endocarditis prophylaxis prior to any dental procedures involving gingival manipulation.   #AAA s/p endovascular repair in 2023 with no evidence of endoleak Plan Continue to follow-up with vascular surgery   #Chronic tobacco abuse Plan The patient was counseled on the dangers of tobacco use, both inhaled and oral, which include, but are not limited to cardiovascular disease, increased cancer risk of multiple types of cancer, COPD, peripheral vascular disease, strokes. He was also counseled on the benefits of smoking cessation. The patient was firmly advised to quit.    We also reviewed strategies to maximize success, including: Removing cigarettes and smoking materials from environment Stress management Substitution of other forms of reinforcement Support of family/friends. Selecting a quit date. Patient provided contact information for 1-800-QUIT-NOW     #1.3 cm lung nodule suspicious for bronchogenic carcinoma per CT report Plan Refer to pulmonology for further work-up

## 2022-07-22 ENCOUNTER — Ambulatory Visit: Payer: Medicare Other | Admitting: Cardiology

## 2022-07-24 ENCOUNTER — Encounter: Payer: Medicare Other | Admitting: Internal Medicine

## 2022-07-25 NOTE — Progress Notes (Signed)
This encounter was created in error - please disregard.

## 2022-08-01 ENCOUNTER — Other Ambulatory Visit: Payer: Self-pay

## 2022-08-01 ENCOUNTER — Emergency Department (HOSPITAL_COMMUNITY)
Admission: EM | Admit: 2022-08-01 | Discharge: 2022-08-01 | Disposition: A | Discharge status: Home / Self Care | Payer: Medicare HMO | Attending: Emergency Medicine | Admitting: Emergency Medicine

## 2022-08-01 ENCOUNTER — Encounter (HOSPITAL_COMMUNITY): Payer: Self-pay

## 2022-08-01 DIAGNOSIS — J449 Chronic obstructive pulmonary disease, unspecified: Secondary | ICD-10-CM | POA: Diagnosis not present

## 2022-08-01 DIAGNOSIS — Z7982 Long term (current) use of aspirin: Secondary | ICD-10-CM | POA: Diagnosis not present

## 2022-08-01 DIAGNOSIS — I2510 Atherosclerotic heart disease of native coronary artery without angina pectoris: Secondary | ICD-10-CM | POA: Diagnosis not present

## 2022-08-01 DIAGNOSIS — R030 Elevated blood-pressure reading, without diagnosis of hypertension: Secondary | ICD-10-CM | POA: Insufficient documentation

## 2022-08-01 DIAGNOSIS — I10 Essential (primary) hypertension: Secondary | ICD-10-CM | POA: Insufficient documentation

## 2022-08-01 LAB — CBC WITH DIFFERENTIAL/PLATELET
Abs Immature Granulocytes: 0.02 10*3/uL (ref 0.00–0.07)
Basophils Absolute: 0.1 10*3/uL (ref 0.0–0.1)
Basophils Relative: 1 %
Eosinophils Absolute: 0.1 10*3/uL (ref 0.0–0.5)
Eosinophils Relative: 1 %
HCT: 44.6 % (ref 39.0–52.0)
Hemoglobin: 15.2 g/dL (ref 13.0–17.0)
Immature Granulocytes: 0 %
Lymphocytes Relative: 23 %
Lymphs Abs: 1.6 10*3/uL (ref 0.7–4.0)
MCH: 34.6 pg — ABNORMAL HIGH (ref 26.0–34.0)
MCHC: 34.1 g/dL (ref 30.0–36.0)
MCV: 101.6 fL — ABNORMAL HIGH (ref 80.0–100.0)
Monocytes Absolute: 0.5 10*3/uL (ref 0.1–1.0)
Monocytes Relative: 7 %
Neutro Abs: 4.7 10*3/uL (ref 1.7–7.7)
Neutrophils Relative %: 68 %
Platelets: 154 10*3/uL (ref 150–400)
RBC: 4.39 MIL/uL (ref 4.22–5.81)
RDW: 12.8 % (ref 11.5–15.5)
WBC: 6.9 10*3/uL (ref 4.0–10.5)
nRBC: 0 % (ref 0.0–0.2)

## 2022-08-01 LAB — URINALYSIS, ROUTINE W REFLEX MICROSCOPIC
Bilirubin Urine: NEGATIVE
Glucose, UA: NEGATIVE mg/dL
Hgb urine dipstick: NEGATIVE
Ketones, ur: 5 mg/dL — AB
Leukocytes,Ua: NEGATIVE
Nitrite: NEGATIVE
Protein, ur: NEGATIVE mg/dL
Specific Gravity, Urine: 1.009 (ref 1.005–1.030)
pH: 7 (ref 5.0–8.0)

## 2022-08-01 LAB — BASIC METABOLIC PANEL
Anion gap: 8 (ref 5–15)
BUN: 16 mg/dL (ref 8–23)
CO2: 27 mmol/L (ref 22–32)
Calcium: 9.4 mg/dL (ref 8.9–10.3)
Chloride: 102 mmol/L (ref 98–111)
Creatinine, Ser: 0.99 mg/dL (ref 0.61–1.24)
GFR, Estimated: >60 mL/min (ref >60)
Glucose, Bld: 80 mg/dL (ref 70–99)
Potassium: 4.9 mmol/L (ref 3.5–5.1)
Sodium: 137 mmol/L (ref 135–145)

## 2022-08-01 NOTE — ED Triage Notes (Signed)
Pt went to PCP and BP was 182/95. Pt does not take an BP medication.

## 2022-08-01 NOTE — Discharge Instructions (Signed)
Please follow up with your PCP tomorrow about being placed on blood pressure medication. If you have any concern, new or worsening symptoms, please return to the ER for re-evaluation.   Contact a health care provider if you: Think you are having a reaction to a medicine you are taking. Have headaches that keep coming back (recurring). Feel dizzy. Have swelling in your ankles. Have trouble with your vision. Get help right away if you: Develop a severe headache or confusion. Have unusual weakness or numbness. Feel faint. Have severe pain in your chest or abdomen. Vomit repeatedly. Have trouble breathing. These symptoms may be an emergency. Get help right away. Call 911. Do not wait to see if the symptoms will go away. Do not drive yourself to the hospital.

## 2022-08-01 NOTE — ED Provider Notes (Signed)
[facility] EMERGENCY DEPARTMENT Provider Note   CSN: [id number] Arrival date & time: 08/01/22  1141     History Chief Complaint  Patient presents with   Hypertension    TIRON SOBIN is a 71 y.o. male with history of AAA, CAD, COPD presents the emergency room for evaluation of elevated blood pressure.  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  Patient denies any headache, blurry vision, chest pain, or shortness of breath.  He reports that he feels fine.  He reports that he was seen by his primary care office yesterday who did not mention anything about his blood pressure.  He was instructed to go to the emergency department for evaluation.  He currently is on any medication for his blood pressure.   Hypertension Pertinent negatives include no chest pain, no headaches and no shortness of breath.       Home Medications Prior to Admission medications   Medication Sig Start Date End Date Taking? Authorizing Provider  albuterol (VENTOLIN HFA) 108 (90 Base) MCG/ACT inhaler Inhale 1-2 puffs into the lungs every 6 (six) hours as needed for wheezing or shortness of breath. 08/29/20   Tacy Learn, PA-C  ALPRAZolam Duanne Moron) 1 MG tablet Take 1 mg by mouth 4 (four) times daily as needed for anxiety.  09/17/16   [provider]  aspirin EC 81 MG tablet Take 81 mg by mouth daily.     [provider]  HYDROcodone-acetaminophen (NORCO) 7.5-325 MG tablet Take 1 tablet by mouth every 6 (six) hours as needed for moderate pain. 02/07/22   Rhyne, Hulen Shouts, PA-C  meclizine (ANTIVERT) 25 MG tablet Take 1 tablet (25 mg total) by mouth 3 (three) times daily as needed for dizziness. 04/27/21   Marcello Fennel, PA-C  Multiple Vitamins-Minerals (CENTRUM MEN) TABS Take 1 tablet by mouth daily.    [provider]  nitroGLYCERIN (NITROSTAT) 0.4 MG SL tablet PLACE 1  TABLET UNDER THE TONGUE EVERY 5 MINUTES AS NEEDED FOR CHEST PAIN Patient taking differently: Place 0.4 mg under the tongue every 5 (five) minutes as needed for chest pain. 02/11/18   Lendon Colonel, NP  omeprazole (PRILOSEC) 40 MG capsule Take 40 mg by mouth daily as needed (heartburn). 08/13/20   [provider]  pantoprazole (PROTONIX) 40 MG tablet Take 1 tablet (40 mg total) by mouth 2 (two) times daily before a meal. Patient not taking: Reported on 02/06/2022 08/14/18 09/11/20  Irwin Brakeman L, MD  rosuvastatin (CRESTOR) 10 MG tablet Take 1 tablet (10 mg total) by mouth daily. 02/07/22 02/07/23  Rhyne, Hulen Shouts, PA-C  vitamin B-12 (CYANOCOBALAMIN) 1000 MCG tablet Take 1,000 mcg by mouth daily.    [provider]      Allergies    Patient has no known allergies.    Review of Systems   Review of Systems  Eyes:  Negative for visual disturbance.  Respiratory:  Negative for shortness of breath.   Cardiovascular:  Negative for chest pain.  Neurological:  Negative for headaches.    Physical Exam Updated Vital Signs BP (!) 165/92 (BP Location: Right Arm)   Pulse 83   Temp 98.3 F (36.8 C) (Oral)   Resp (!) 22   Ht 5\' 7"  (1.702 m)   Wt 61.7 kg   SpO2 95%   BMI 21.30 kg/m  Physical Exam  Vitals and nursing note reviewed.  Constitutional:      General: He is not in acute distress.    Appearance: Normal appearance. He is not ill-appearing or toxic-appearing.  HENT:     Head: Normocephalic and atraumatic.  Eyes:     General: No scleral icterus. Cardiovascular:     Rate and Rhythm: Normal rate and regular rhythm.  Pulmonary:     Effort: Pulmonary effort is normal. No respiratory distress.  Musculoskeletal:        General: No deformity.     Cervical back: Normal range of motion.  Skin:    General: Skin is warm and dry.  Neurological:     General: No focal deficit present.     Mental Status: He is alert. Mental status is at baseline.     GCS: GCS eye  subscore is 4. GCS verbal subscore is 5. GCS motor subscore is 6.     Cranial Nerves: No cranial nerve deficit, dysarthria or facial asymmetry.     Sensory: No sensory deficit.     Motor: No weakness or pronator drift.     ED Results / Procedures / Treatments   Labs (all labs ordered are listed, but only abnormal results are displayed) Labs Reviewed  CBC WITH DIFFERENTIAL/PLATELET - Abnormal; Notable for the following components:      Result Value   MCV 101.6 (*)    MCH 34.6 (*)    All other components within normal limits  URINALYSIS, ROUTINE W REFLEX MICROSCOPIC - Abnormal; Notable for the following components:   Color, Urine STRAW (*)    Ketones, ur 5 (*)    All other components within normal limits  BASIC METABOLIC PANEL    EKG None  Radiology No results found.  Procedures Procedures   Medications Ordered in ED Medications - No data to display  ED Course/ Medical Decision Making/ A&P                           Medical Decision Making Amount and/or Complexity of Data Reviewed Labs: ordered.   71 year old male presents emergency [facility] for evaluation of elevated blood pressure.  Differential diagnosis includes was limited to elevated blood pressure, electrolyte abnormality, endorgan damage.  Vital signs shows elevated blood pressure 165/92 otherwise afebrile, normal pulse rate, respiratory rate chart is 22 although patient with out any increased work of breathing satting at 95%.  Physical exam as noted above.  Patient is completely asymptomatic, no chest pain, shortness breath, headache, or blurry vision.  We will obtain some basic lab work.  I independently reviewed and interpreted the patient's labs and imaging.  CBC without leukocytosis or anemia.  BMP without any electrolyte abnormality.  Urinalysis shows 5 ketones and straw color otherwise negative.  No proteinuria.  The patient described to me that they took his blood pressure over his very thick jacket and  got an MRI that was systolically in the 024O.  He reports he was seen by his primary care doctor yesterday and had a blood pressure of 157/81 and he was not concerned about it.  I advised the patient to follow-up with his primary care doctor tomorrow to see if they would like to put him on any blood pressure medications.  Do not see any evidence of any endorgan damage.  I doubt any stroke at this time as patient's blood pressure is slightly elevated at 165/92.  He has a benign neurological exam.  He is  safe for discharge home with close PCP follow-up.  We discussed return precautions for for symptoms.  Patient verbalized understanding agrees the plan.  Patient stable be discharged home in good condition.  I discussed this case with my attending physician who cosigned this note including patient's presenting symptoms, physical exam, and planned diagnostics and interventions. Attending physician stated agreement with plan or made changes to plan which were implemented.   Final Clinical Impression(s) / ED Diagnoses Final diagnoses:  Elevated blood pressure reading    Rx / DC Orders ED Discharge Orders     None         Sherrell Puller, PA-C 08/02/22 1557    Sherwood Gambler, MD 08/03/22 [id number]

## 2022-08-13 ENCOUNTER — Encounter: Payer: Self-pay | Admitting: Cardiology

## 2022-08-13 ENCOUNTER — Ambulatory Visit: Payer: Medicare HMO | Attending: Cardiology | Admitting: Cardiology

## 2022-08-13 VITALS — BP 150/88 | HR 87 | Ht 67.0 in | Wt 134.2 lb

## 2022-08-13 DIAGNOSIS — I25119 Atherosclerotic heart disease of native coronary artery with unspecified angina pectoris: Secondary | ICD-10-CM

## 2022-08-13 DIAGNOSIS — Z951 Presence of aortocoronary bypass graft: Secondary | ICD-10-CM

## 2022-08-13 DIAGNOSIS — I7141 Pararenal abdominal aortic aneurysm, without rupture: Secondary | ICD-10-CM | POA: Diagnosis not present

## 2022-08-13 DIAGNOSIS — E785 Hyperlipidemia, unspecified: Secondary | ICD-10-CM

## 2022-08-13 DIAGNOSIS — Z720 Tobacco use: Secondary | ICD-10-CM

## 2022-08-13 DIAGNOSIS — I255 Ischemic cardiomyopathy: Secondary | ICD-10-CM

## 2022-08-13 MED ORDER — LOSARTAN POTASSIUM 50 MG PO TABS: 50.0000 mg | ORAL_TABLET | Freq: Every day | ORAL | 3 refills | Status: DC

## 2022-08-13 NOTE — Addendum Note (Signed)
Addended by: Linton Ham on: 08/13/2022 09:21 AM   Modules accepted: Orders

## 2022-08-13 NOTE — Patient Instructions (Signed)
Medication Instructions:  Your physician has recommended you make the following change in your medication:  INCREASE: Losartan 50mg  daily.  *If you need a refill on your cardiac medications before your next appointment, please call your pharmacy*   Lab Work: Your physician recommends that you have the following lab work drawn: Lipids  If you have labs (blood work) drawn today and your tests are completely normal, you will receive your results only by: [facility] (if you have MyChart) OR A paper copy in the mail If you have any lab test that is abnormal or we need to change your treatment, we will call you to review the results.   Testing/Procedures: Your physician has requested that you have an echocardiogram. Echocardiography is a painless test that uses sound waves to create images of your heart. It provides your doctor with information about the size and shape of your heart and how well your heart's chambers and valves are working. This procedure takes approximately one hour. There are no restrictions for this procedure. Please do NOT wear cologne, perfume, aftershave, or lotions (deodorant is allowed). Please arrive 15 minutes prior to your appointment time.  Your physician has requested that you have an exercise stress myoview. For further information please visit [email or web address]. Please follow instruction sheet, as given.   The test will take approximately 3 to 4 hours to complete; you may bring reading material.  If someone comes with you to your appointment, they will need to remain in the main lobby due to limited space in the testing area.   How to prepare for your Myocardial Perfusion Test: Do not eat or drink 3 hours prior to your test, except you may have water. Do not consume products containing caffeine (regular or decaffeinated) 12 hours prior to your test. (ex: coffee, chocolate, sodas, tea). Do bring a list of your current medications with you.  If not listed  below, you may take your medications as normal. Do wear comfortable clothes (no dresses or overalls) and walking shoes, tennis shoes preferred (No heels or open toe shoes are allowed). Do NOT wear cologne, perfume, aftershave, or lotions (deodorant is allowed). If these instructions are not followed your test will have to be rescheduled.      Follow-Up: At [facility], you and your health needs are our priority.  As part of our continuing mission to provide you with exceptional heart care, we have created designated Provider Care Teams.  These Care Teams include your primary Cardiologist (physician) and Advanced Practice Providers (APPs -  Physician Assistants and Nurse Practitioners) who all work together to provide you with the care you need, when you need it.  We recommend signing up for the patient portal called "MyChart".  Sign up information is provided on this After Visit Summary.  MyChart is used to connect with patients for Virtual Visits (Telemedicine).  Patients are able to view lab/test results, encounter notes, upcoming appointments, etc.  Non-urgent messages can be sent to your provider as well.   To learn more about what you can do with MyChart, go to [email or web address].    Your next appointment:   16 week(s)  The format for your next appointment:   In Person  Provider:   Berniece Salines, DO

## 2022-08-13 NOTE — Progress Notes (Signed)
Cardiology Office Note:    Date:  08/13/2022   ID:  [name], DOB [birth year], MRN [id number]  PCP:  [name], MD  Cardiologist:  Thomasene Ripple, DO  Electrophysiologist:  None   Referring MD: [name], MD   " I am ok"  History of Present Illness:    [name] is a 71 y.o. male with a hx of coronary artery disease status post two-vessel CABG in 2015 with LIMA to LAD, SVG to RCA, repeat heart catheterization back in 2018 she showed LIMA graft still open, as well as SVG graft still patent-his medical management was optimized at that time, pulmonary nodule, status post tricuspid valve repair, COPD, current smoker, hyperlipidemia, history of abdominal aortic endovascular stent graft in 2023.  He is here today to reestablish cardiac care.  He has had some intermittent chest discomfort.  But he tells me this was several months back.  No chest pain now.  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   100% LAD, Severe dom RCA dz, small non-dom Cx --> CABG x 2 LIMA-LAD, SVG-RCA   MI (myocardial infarction) (HCC)    Pulmonary nodule    Benign   Status post tricuspid valve repair     Past Surgical History:  Procedure Laterality Date   ABDOMINAL AORTIC ENDOVASCULAR STENT GRAFT N/A 02/06/2022   Procedure: ENDOVASUCLAR REPAIR OF ABDOMINAL AORTIC  ANEURSYM;  Surgeon: Nada Libman, MD;  Location: [facility] OR;  Service: Vascular;  Laterality: N/A;   BIOPSY  09/08/2018   Procedure: BIOPSY;  Surgeon: West Bali, MD;  Location: [facility];  Service: Endoscopy;;  gastric   CARDIAC CATHETERIZATION  01/2015   Coronary angiography on 02/17/15 demonstrated the left main coronary artery to have mild 30-40% stenosis, 100%  LAD stenosis with right to left collaterals and faint left to left collaterals, small nondominant left circumflex, small to medium caliber first obtuse marginal branch with 20% ostial and proximal stenosis. The RCA was noted to have diffuse disease.   CARDIAC CATHETERIZATION N/A 11/11/2016   Procedure: Left Heart Cath and Cors/Grafts Angiography;  Surgeon: Marykay Lex, MD;  Location: [facility] INVASIVE CV LAB;  Service: Cardiovascular;  Laterality: N/A;   CORONARY ARTERY BYPASS GRAFT  01/2015   ESOPHAGOGASTRODUODENOSCOPY (EGD) WITH PROPOFOL N/A 09/08/2018   Procedure: ESOPHAGOGASTRODUODENOSCOPY (EGD) WITH PROPOFOL;  Surgeon: West Bali, MD;  Location: [facility];  Service: Endoscopy;  Laterality: N/A;  2:45pm   INGUINAL HERNIA REPAIR Bilateral    ULTRASOUND GUIDANCE FOR VASCULAR ACCESS  02/06/2022   Procedure: ULTRASOUND GUIDANCE FOR VASCULAR ACCESS;  Surgeon: Nada Libman, MD;  Location: [facility] OR;  Service: Vascular;;   VALVE REPLACEMENT  01/2015   Tricuspid Valve Replacement    Current Medications: Current Meds  Medication Sig   albuterol (VENTOLIN HFA) 108 (90 Base) MCG/ACT inhaler Inhale 1-2 puffs into the lungs every 6 (six) hours as needed for wheezing or shortness of breath.   ALPRAZolam (XANAX) 1 MG tablet Take 1 mg by mouth 4 (four) times daily as needed for anxiety.    aspirin EC 81 MG tablet Take 81 mg by mouth daily.    HYDROcodone-acetaminophen (NORCO) 7.5-325 MG tablet Take 1 tablet by  mouth every 6 (six) hours as needed for moderate pain.   losartan (COZAAR) 50 MG tablet Take 1 tablet (50 mg total) by mouth daily.   meclizine (ANTIVERT) 25 MG tablet Take 1 tablet (25 mg total) by mouth 3 (three) times daily as needed for dizziness.   Multiple Vitamins-Minerals (CENTRUM MEN) TABS Take 1 tablet by mouth daily.   vitamin B-12 (CYANOCOBALAMIN) 1000 MCG tablet Take 1,000 mcg by mouth daily.   [DISCONTINUED] losartan (COZAAR) 25 MG tablet Take 25 mg by mouth daily.      Allergies:   Patient has no known allergies.   Social History   Socioeconomic History   Marital status: Divorced    Spouse name: Not on file   Number of children: Not on file   Years of education: Not on file   Highest education level: Not on file  Occupational History   Not on file  Tobacco Use   Smoking status: Every Day    Packs/day: 2.00    Years: 60.00    Total pack years: 120.00    Types: Cigarettes    Last attempt to quit: 02/11/2015    Years since quitting: 7.5   Smokeless tobacco: Never  Vaping Use   Vaping Use: Never used  Substance and Sexual Activity   Alcohol use: Not Currently    Alcohol/week: 1.0 standard drink of alcohol    Types: 1 Cans of beer per week    Comment: one beer a week   Drug use: No   Sexual activity: Not on file  Other Topics Concern   Not on file  Social History Narrative   Not on file   Social Determinants of Health   Financial Resource Strain: Not on file  Food Insecurity: Not on file  Transportation Needs: Not on file  Physical Activity: Not on file  Stress: Not on file  Social Connections: Not on file     Family History: The patient's family history includes CAD in his brother and brother; Heart attack in his brother, brother, and sister; Heart disease in his father.  ROS:   Review of Systems  Constitution: Negative for decreased appetite, fever and weight gain.  HENT: Negative for congestion, ear discharge, hoarse voice and sore throat.   Eyes: Negative for discharge, redness, vision loss in right eye and visual halos.  Cardiovascular: Negative for chest pain, dyspnea on exertion, leg swelling, orthopnea and palpitations.  Respiratory: Negative for cough, hemoptysis, shortness of breath and snoring.   Endocrine: Negative for heat intolerance and polyphagia.  Hematologic/Lymphatic: Negative for bleeding problem. Does not bruise/bleed easily.  Skin: Negative for flushing, nail changes, rash and suspicious lesions.   Musculoskeletal: Negative for arthritis, joint pain, muscle cramps, myalgias, neck pain and stiffness.  Gastrointestinal: Negative for abdominal pain, bowel incontinence, diarrhea and excessive appetite.  Genitourinary: Negative for decreased libido, genital sores and incomplete emptying.  Neurological: Negative for brief paralysis, focal weakness, headaches and loss of balance.  Psychiatric/Behavioral: Negative for altered mental status, depression and suicidal ideas.  Allergic/Immunologic: Negative for HIV exposure and persistent infections.    EKGs/Labs/Other Studies Reviewed:    The following studies were reviewed today:   EKG:  The ekg ordered today demonstrates sinus rhythm, heart rate 87 bpm.  TTE 08/14/2018 cc:   -------------------------------------------------------------------  LV EF: 45% -   50%   -------------------------------------------------------------------  History:   PMH:  tricuspid valve repair in 2016 with M3  annuloplasty ring. AAA.  Coronary artery disease.  Angina pectoris.  Chronic obstructive pulmonary disease.  Risk factors:  Current  tobacco use. Dyslipidemia.   -------------------------------------------------------------------  Study Conclusions   - Left ventricle: The cavity size was normal. Wall thickness was    normal. Systolic function was mildly reduced. The estimated    ejection fraction was in the range of 45% to 50%. Diffuse    hypokinesis. Doppler parameters are consistent with restrictive    physiology, indicative of decreased left ventricular diastolic    compliance and/or increased left atrial pressure. Doppler    parameters are consistent with indeterminate ventricular filling    pressure.  - Regional wall motion abnormality: Hypokinesis of the mid    anterior, mid anteroseptal, and mid anterolateral myocardium.  - Aortic valve: Mildly thickened, moderately calcified leaflets.    Morphologically, there was at least mild if not  mild to moderate    calcific aortic stenosis.  - Mitral valve: Mildly calcified annulus. Normal thickness leaflets    . There was mild regurgitation. Valve area by pressure half-time:    2.27 cm^2.  - Right ventricle: Systolic function was mildly reduced.  - Tricuspid valve: S/p tricuspid valve repair using a 26 mm [employer] M3 annuloplasty ring. There was no significant    regurgitation.  - Systemic veins: The IVC is dilated with normal respiratory    variation. Estimated right atrial pressure is 8 mmHg.   -------------------------------------------------------------------  Study data:  Comparison was made to the study of 11/01/2016.  Study  status:  Routine.  Procedure:  Transthoracic echocardiography.  Image quality was adequate.          Transthoracic  echocardiography.  M-mode, complete 2D, spectral Doppler, and color  Doppler.  Birthdate:  Patient birthdate: [birth year].  Age:  Patient  is 71 yr old.  Sex:  Gender: male.    BMI: 20.4 kg/m^2.  Blood  pressure:     97/57  Patient status:  Inpatient.  Study date:  Study date: 08/14/2018. Study time: 09:26 AM.  Location:  Bedside.     -------------------------------------------------------------------   -------------------------------------------------------------------  Left ventricle:  The cavity size was normal. Wall thickness was  normal. Systolic function was mildly reduced. The estimated  ejection fraction was in the range of 45% to 50%. Diffuse  hypokinesis.  Regional wall motion abnormalities:  Hypokinesis of  the mid anterior, mid anteroseptal, and mid anterolateral  myocardium. Doppler parameters are consistent with restrictive  physiology, indicative of decreased left ventricular diastolic  compliance and/or increased left atrial pressure. Doppler  parameters are consistent with indeterminate ventricular filling  pressure.   -------------------------------------------------------------------  Aortic  valve:   Mildly thickened, moderately calcified leaflets.  Morphologically, there was at least mild if not mild to moderate  calcific aortic stenosis.  Doppler:  There was no significant regurgitation.   -------------------------------------------------------------------  Aorta:  Aortic root: The aortic root was normal in size.   -------------------------------------------------------------------  Mitral valve:   Mildly calcified annulus. Normal thickness leaflets  .  Doppler:  There was mild regurgitation.    Valve area by  pressure half-time: 2.27 cm^2. Indexed valve area by pressure  half-time: 1.36 cm^2/m^2.    Mean gradient (D): 1 mm Hg. Peak  gradient (D): 4 mm Hg.   -------------------------------------------------------------------  Left atrium:  The atrium was normal in size.   -------------------------------------------------------------------  Atrial septum:  No defect or patent foramen ovale was identified.     -------------------------------------------------------------------  Right ventricle:  The cavity size was normal. Systolic function was  mildly  reduced.   -------------------------------------------------------------------  Pulmonic valve:    The valve appears to be grossly normal.  Doppler:  There was no significant regurgitation.   -------------------------------------------------------------------  Tricuspid valve:  S/p tricuspid valve repair using a 26 mm [employer] M3 annuloplasty ring.  Doppler:  There was no  significant regurgitation.   -------------------------------------------------------------------  Right atrium:  The atrium was normal in size.   -------------------------------------------------------------------  Pericardium:  There was no pericardial effusion.   -------------------------------------------------------------------  Systemic veins:  The IVC is dilated with normal respiratory  variation. Estimated right atrial  pressure is 8 mmHg.   LHC 11/11/2016 Ost LAD to Prox LAD lesion, 100 %stenosed. Short reconstitution from LIMA with Prox LAD to Mid LAD lesion, 95 %stenosed. LIMA-mLAD and is normal in caliber and anatomically normal. Dist-apical LAD lesion, 60 %stenosed. Prox RCA to Dist RCA lesion, 100 %stenosed. Seq SVG- dRCA and is large and anatomically normal - fills small rPDA & RPAV with 2 RPLs Patent non-dominant Circumflex with minimal CAD. _________________________________________ There is mild left ventricular systolic dysfunction. The left ventricular ejection fraction is 45-50% by visual estimate. LV end diastolic pressure is mildly elevated.     Widely patent grafts and native circumflex with known occlusion of the LAD and now occluded RCA. No obvious culprit lesion to explain the patient's angina besides the short segment of the proximal LAD isolated by proximal occlusion and distal 95% stenosis. No PCI targets.   Plan: Return to short stay for TR band removal Discharge following bed rest.   Recommendations: Optimize medical management with antianginal therapy. Smoking cessation counseling   F/u with Dr. Purvis Sheffield    Recent Labs: 02/05/2022: ALT 12 02/06/2022: Magnesium 2.2 08/01/2022: BUN 16; Creatinine, Ser 0.99; Hemoglobin 15.2; Platelets 154; Potassium 4.9; Sodium 137  Recent Lipid Panel    Component Value Date/Time   CHOL 145 08/14/2018 0353   TRIG 88 08/14/2018 0353   HDL 33 (L) 08/14/2018 0353   CHOLHDL 4.4 08/14/2018 0353   VLDL 18 08/14/2018 0353   LDLCALC 94 08/14/2018 0353    Physical Exam:    VS:  BP (!) 150/86   Pulse 87   Ht 5\' 7"  (1.702 m)   Wt 134 lb 3.2 oz (60.9 kg)   SpO2 95%   BMI 21.02 kg/m     Wt Readings from Last 3 Encounters:  08/13/22 134 lb 3.2 oz (60.9 kg)  08/01/22 136 lb (61.7 kg)  04/19/22 135 lb (61.2 kg)     GEN: Well nourished, well developed in no acute distress HEENT: Normal NECK: No JVD; No carotid bruits LYMPHATICS: No  lymphadenopathy CARDIAC: S1S2 noted,RRR, no murmurs, rubs, gallops RESPIRATORY:  Clear to auscultation without rales, wheezing or rhonchi  ABDOMEN: Soft, non-tender, non-distended, +bowel sounds, no guarding. EXTREMITIES: No edema, No cyanosis, no clubbing MUSCULOSKELETAL:  No deformity  SKIN: Warm and dry NEUROLOGIC:  Alert and oriented x 3, non-focal PSYCHIATRIC:  Normal affect, good insight  ASSESSMENT:    1. Coronary artery disease involving native coronary artery of native heart with angina pectoris (HCC)   2. Ischemic cardiomyopathy   3. Hyperlipidemia, unspecified hyperlipidemia type   4. Pararenal abdominal aortic aneurysm (AAA) without rupture (HCC)   5. S/P CABG (coronary artery bypass graft)   6. Tobacco abuse    PLAN:    1.  CAD-further testing needs to be done especially in plans of clearing the patient for his DOT physical.  We will pursue an exercise nuclear stress test.  We  will continue aspirin and statin.  We will get lipid profile.  2.  He is hypertensive in the office today.  Blood pressure also repeated.  We will increase his losartan to 50 mg daily.  3.  Continue his atorvastatin.   4.  The patient was counseled on tobacco cessation today for 5 minutes.  Counseling included reviewing the risks of smoking tobacco products, how it impacts the patient's current medical diagnoses and different strategies for quitting.  Pharmacotherapy to aid in tobacco cessation was not prescribed today. The patient coordinate with  primary care provider.  The patient was also advised to call   1-800-QUIT-NOW [phone])741-9683) for additional help with quitting smoking.   The patient is in agreement with the above plan. The patient left the office in stable condition.  The patient will follow up in 4 months.   Medication Adjustments/Labs and Tests Ordered: Current medicines are reviewed at length with the patient today.  Concerns regarding medicines are outlined above.  Orders  Placed This Encounter  Procedures   Lipid panel   MYOCARDIAL PERFUSION IMAGING   EKG 12-Lead   ECHOCARDIOGRAM COMPLETE   Meds ordered this encounter  Medications   losartan (COZAAR) 50 MG tablet    Sig: Take 1 tablet (50 mg total) by mouth daily.    Dispense:  90 tablet    Refill:  3    Patient Instructions  Medication Instructions:  Your physician has recommended you make the following change in your medication:  INCREASE: Losartan  daily.  *If you need a refill on your cardiac medications before your next appointment, please call your pharmacy*   Lab Work: Your physician recommends that you have the following lab work drawn: Lipids  If you have labs (blood work) drawn today and your tests are completely normal, you will receive your results only by: MyChart Message (if you have MyChart) OR A paper copy in the mail If you have any lab test that is abnormal or we need to change your treatment, we will call you to review the results.   Testing/Procedures: Your physician has requested that you have an echocardiogram. Echocardiography is a painless test that uses sound waves to create images of your heart. It provides your doctor with information about the size and shape of your heart and how well your heart's chambers and valves are working. This procedure takes approximately one hour. There are no restrictions for this procedure. Please do NOT wear cologne, perfume, aftershave, or lotions (deodorant is allowed). Please arrive 15 minutes prior to your appointment time.  Your physician has requested that you have an exercise stress myoview. For further information please visit [email or web address] Please follow instruction sheet, as given.   The test will take approximately 3 to 4 hours to complete; you may bring reading material.  If someone comes with you to your appointment, they will need to remain in the main lobby due to limited space in the testing area.   How to  prepare for your Myocardial Perfusion Test: Do not eat or drink 3 hours prior to your test, except you may have water. Do not consume products containing caffeine (regular or decaffeinated) 12 hours prior to your test. (ex: coffee, chocolate, sodas, tea). Do bring a list of your current medications with you.  If not listed below, you may take your medications as normal. Do wear comfortable clothes (no dresses or overalls) and walking shoes, tennis shoes preferred (No heels or open toe shoes are allowed). Do NOT  wear cologne, perfume, aftershave, or lotions (deodorant is allowed). If these instructions are not followed your test will have to be rescheduled.      Follow-Up: At [facility], you and your health needs are our priority.  As part of our continuing mission to provide you with exceptional heart care, we have created designated Provider Care Teams.  These Care Teams include your primary Cardiologist (physician) and Advanced Practice Providers (APPs -  Physician Assistants and Nurse Practitioners) who all work together to provide you with the care you need, when you need it.  We recommend signing up for the patient portal called "MyChart".  Sign up information is provided on this After Visit Summary.  MyChart is used to connect with patients for Virtual Visits (Telemedicine).  Patients are able to view lab/test results, encounter notes, upcoming appointments, etc.  Non-urgent messages can be sent to your provider as well.   To learn more about what you can do with MyChart, go to [email or web address].    Your next appointment:   16 week(s)  The format for your next appointment:   In Person  Provider:   Thomasene Ripple, DO      Adopting a Healthy Lifestyle.  Know what a healthy weight is for you (roughly BMI <25) and aim to maintain this   Aim for 7+ servings of fruits and vegetables daily   65-80+ fluid ounces of water or unsweet tea for healthy kidneys   Limit to max 1  drink of alcohol per day; avoid smoking/tobacco   Limit animal fats in diet for cholesterol and heart health - choose grass fed whenever available   Avoid highly processed foods, and foods high in saturated/trans fats   Aim for low stress - take time to unwind and care for your mental health   Aim for 150 min of moderate intensity exercise weekly for heart health, and weights twice weekly for bone health   Aim for 7-9 hours of sleep daily   When it comes to diets, agreement about the perfect plan isnt easy to find, even among the experts. Experts at the [facility] of [employer] developed an idea known as the Healthy Eating Plate. Just imagine a plate divided into logical, healthy portions.   The emphasis is on diet quality:   Load up on vegetables and fruits - one-half of your plate: Aim for color and variety, and remember that potatoes dont count.   Go for whole grains - one-quarter of your plate: Whole wheat, barley, wheat berries, quinoa, oats, brown rice, and foods made with them. If you want pasta, go with whole wheat pasta.   Protein power - one-quarter of your plate: Fish, chicken, beans, and nuts are all healthy, versatile protein sources. Limit red meat.   The diet, however, does go beyond the plate, offering a few other suggestions.   Use healthy plant oils, such as olive, canola, soy, corn, sunflower and peanut. Check the labels, and avoid partially hydrogenated oil, which have unhealthy trans fats.   If youre thirsty, drink water. Coffee and tea are good in moderation, but skip sugary drinks and limit milk and dairy products to one or two daily servings.   The type of carbohydrate in the diet is more important than the amount. Some sources of carbohydrates, such as vegetables, fruits, whole grains, and beans-are healthier than others.   Finally, stay active  Signed, Thomasene Ripple, DO  08/13/2022 9:18 AM    [facility]

## 2022-08-14 ENCOUNTER — Other Ambulatory Visit (HOSPITAL_COMMUNITY): Payer: Self-pay | Admitting: Nurse Practitioner

## 2022-08-14 DIAGNOSIS — R911 Solitary pulmonary nodule: Secondary | ICD-10-CM

## 2022-08-14 LAB — LIPID PANEL
Chol/HDL Ratio: 2.9 ratio (ref 0.0–5.0)
Cholesterol, Total: 115 mg/dL (ref 100–199)
HDL: 39 mg/dL — ABNORMAL LOW (ref >39)
LDL Chol Calc (NIH): 58 mg/dL (ref 0–99)
Triglycerides: 96 mg/dL (ref 0–149)
VLDL Cholesterol Cal: 18 mg/dL (ref 5–40)

## 2022-08-28 ENCOUNTER — Telehealth (HOSPITAL_COMMUNITY): Payer: Self-pay | Admitting: *Deleted

## 2022-08-28 NOTE — Addendum Note (Signed)
Addended by: Thomasene Ripple on: 08/28/2022 08:16 AM   Modules accepted: Orders

## 2022-08-28 NOTE — Telephone Encounter (Signed)
Left a detailed voice message regarding his MPI study scheduled for Friday 08/30/22 as requested per his DPR.

## 2022-08-29 ENCOUNTER — Encounter (HOSPITAL_COMMUNITY)
Admission: RE | Admit: 2022-08-29 | Discharge: 2022-08-29 | Disposition: A | Discharge status: Home / Self Care | Payer: Medicare HMO | Source: Ambulatory Visit | Attending: Nurse Practitioner | Admitting: Nurse Practitioner

## 2022-08-29 DIAGNOSIS — R911 Solitary pulmonary nodule: Secondary | ICD-10-CM | POA: Diagnosis present

## 2022-08-29 LAB — GLUCOSE, CAPILLARY: Glucose-Capillary: 83 mg/dL (ref 70–99)

## 2022-08-29 MED ORDER — FLUDEOXYGLUCOSE F - 18 (FDG) INJECTION
6.5000 | Freq: Once | INTRAVENOUS | Status: AC
  Administered 2022-08-29: 6.69 via INTRAVENOUS

## 2022-08-30 ENCOUNTER — Ambulatory Visit (HOSPITAL_COMMUNITY): Payer: Medicare HMO | Attending: Cardiology

## 2022-08-30 ENCOUNTER — Ambulatory Visit (HOSPITAL_BASED_OUTPATIENT_CLINIC_OR_DEPARTMENT_OTHER): Payer: Medicare HMO

## 2022-08-30 DIAGNOSIS — I255 Ischemic cardiomyopathy: Secondary | ICD-10-CM | POA: Diagnosis present

## 2022-08-30 DIAGNOSIS — I25119 Atherosclerotic heart disease of native coronary artery with unspecified angina pectoris: Secondary | ICD-10-CM | POA: Diagnosis present

## 2022-08-30 LAB — ECHOCARDIOGRAM COMPLETE
Area-P 1/2: 3.77 cm2
Calc EF: 51.1 %
Height: 67 in
S' Lateral: 3.8 cm
Single Plane A2C EF: 55 %
Single Plane A4C EF: 48.5 %
Weight: 2144 oz

## 2022-08-30 LAB — MYOCARDIAL PERFUSION IMAGING
Angina Index: 0
Estimated workload: 6.8
Exercise duration (min): 5 min
Exercise duration (sec): 31 s
MPHR: 147 {beats}/min
Peak HR: 148 {beats}/min
Percent HR: 100 %
Rest HR: 53 {beats}/min
Rest Nuclear Isotope Dose: 10.7 mCi
Stress Nuclear Isotope Dose: 30.7 mCi

## 2022-08-30 MED ORDER — TECHNETIUM TC 99M TETROFOSMIN IV KIT
10.7000 | PACK | Freq: Once | INTRAVENOUS | Status: AC | PRN
  Administered 2022-08-30: 10.7 via INTRAVENOUS

## 2022-08-30 MED ORDER — TECHNETIUM TC 99M TETROFOSMIN IV KIT
30.7000 | PACK | Freq: Once | INTRAVENOUS | Status: AC | PRN
  Administered 2022-08-30: 30.7 via INTRAVENOUS

## 2022-09-17 ENCOUNTER — Telehealth: Payer: Self-pay | Admitting: Cardiology

## 2022-09-17 NOTE — Telephone Encounter (Signed)
Patient is calling for results to his stress test.

## 2022-09-17 NOTE — Telephone Encounter (Signed)
Spoke with patient and gave him the results of stress test and echo per Dr. Servando Salina: "Your stress showed your old heart attack - no need for further testing.  Your heart function is low. I would like to make some medication changes."  Patient scheduled for an OV on 12/5 with Dr. Servando Salina.

## 2022-09-24 ENCOUNTER — Encounter: Payer: Self-pay | Admitting: Cardiology

## 2022-09-24 ENCOUNTER — Ambulatory Visit: Payer: Medicare HMO | Attending: Cardiology | Admitting: Cardiology

## 2022-09-24 VITALS — BP 116/70 | HR 63 | Ht 66.0 in | Wt 129.8 lb

## 2022-09-24 DIAGNOSIS — Z951 Presence of aortocoronary bypass graft: Secondary | ICD-10-CM

## 2022-09-24 DIAGNOSIS — Z79899 Other long term (current) drug therapy: Secondary | ICD-10-CM

## 2022-09-24 DIAGNOSIS — I10 Essential (primary) hypertension: Secondary | ICD-10-CM | POA: Diagnosis not present

## 2022-09-24 DIAGNOSIS — E782 Mixed hyperlipidemia: Secondary | ICD-10-CM

## 2022-09-24 DIAGNOSIS — I2510 Atherosclerotic heart disease of native coronary artery without angina pectoris: Secondary | ICD-10-CM

## 2022-09-24 DIAGNOSIS — R0989 Other specified symptoms and signs involving the circulatory and respiratory systems: Secondary | ICD-10-CM

## 2022-09-24 LAB — BASIC METABOLIC PANEL
BUN/Creatinine Ratio: 15 (ref 10–24)
BUN: 15 mg/dL (ref 8–27)
CO2: 25 mmol/L (ref 20–29)
Calcium: 9.4 mg/dL (ref 8.6–10.2)
Chloride: 105 mmol/L (ref 96–106)
Creatinine, Ser: 0.98 mg/dL (ref 0.76–1.27)
Glucose: 87 mg/dL (ref 70–99)
Potassium: 5.1 mmol/L (ref 3.5–5.2)
Sodium: 140 mmol/L (ref 134–144)
eGFR: 81 mL/min/{1.73_m2} (ref >59)

## 2022-09-24 LAB — MAGNESIUM: Magnesium: 2.1 mg/dL (ref 1.6–2.3)

## 2022-09-24 MED ORDER — SACUBITRIL-VALSARTAN 24-26 MG PO TABS: 1.0000 | ORAL_TABLET | Freq: Two times a day (BID) | ORAL | 3 refills | Status: AC

## 2022-09-24 NOTE — Patient Instructions (Signed)
Medication Instructions:  Your physician has recommended you make the following change in your medication:  STOP: Losartan  START: Entresto 24-26mg  Twice Daily  *If you need a refill on your cardiac medications before your next appointment, please call your pharmacy*   Lab Work:  Your physician recommends that you have the following labs drawn today in office: BMET and Magnesium.  If you have labs (blood work) drawn today and your tests are completely normal, you will receive your results only by: MyChart Message (if you have MyChart) OR A paper copy in the mail If you have any lab test that is abnormal or we need to change your treatment, we will call you to review the results.   Testing/Procedures: NONE   Follow-Up: At [facility], you and your health needs are our priority.  As part of our continuing mission to provide you with exceptional heart care, we have created designated Provider Care Teams.  These Care Teams include your primary Cardiologist (physician) and Advanced Practice Providers (APPs -  Physician Assistants and Nurse Practitioners) who all work together to provide you with the care you need, when you need it.  We recommend signing up for the patient portal called "MyChart".  Sign up information is provided on this After Visit Summary.  MyChart is used to connect with patients for Virtual Visits (Telemedicine).  Patients are able to view lab/test results, encounter notes, upcoming appointments, etc.  Non-urgent messages can be sent to your provider as well.   To learn more about what you can do with MyChart, go to [email or web address].    Your next appointment:   4 month(s)  The format for your next appointment:   In Person  Provider:   Thomasene Ripple, DO

## 2022-09-24 NOTE — Progress Notes (Signed)
Cardiology Office Note:    Date:  09/24/2022   ID:  [name], DOB 08/05/1951, MRN [id number]  PCP:  [name], MD  Cardiologist:  Thomasene Ripple, DO  Electrophysiologist:  None   Referring MD: [name], MD   " I am ok"  History of Present Illness:    [name] is a 71 y.o. male with a hx of coronary artery disease status post two-vessel CABG in 2015 with LIMA to LAD, SVG to RCA, repeat heart catheterization back in 2018 she showed LIMA graft still open, as well as SVG graft still patent-his medical management was optimized at that time, pulmonary nodule, status post tricuspid valve repair, COPD, current smoker, hyperlipidemia, history of abdominal aortic endovascular stent graft in 2023.  I saw the patient recently and during that visit I sent him for a nuclear stress test as well as an echocardiogram.  Both came back with a depressed ejection fraction. Here for no complaints today.  Past Surgical History:  Procedure Laterality Date   ABDOMINAL AORTIC ENDOVASCULAR STENT GRAFT N/A 02/06/2022   Procedure: ENDOVASUCLAR REPAIR OF ABDOMINAL AORTIC  ANEURSYM;  Surgeon: Nada Libman, MD;  Location: [facility] OR;  Service: Vascular;  Laterality: N/A;   BIOPSY  09/08/2018   Procedure: BIOPSY;  Surgeon: West Bali, MD;  Location: [facility] SU[facility];  Service: Endoscopy;;  gastric   CARDIAC CATHETERIZATION  01/2015   Coronary angiography on 02/17/15 demonstrated the left main coronary artery to have mild 30-40% stenosis, 100% LAD stenosis with right to left collaterals and faint left to left collaterals, small nondominant left circumflex, small to medium caliber first obtuse marginal branch with 20% ostial and proximal stenosis. The RCA was noted to have diffuse disease.   CARDIAC CATHETERIZATION N/A 11/11/2016   Procedure: Left Heart Cath and Cors/Grafts Angiography;  Surgeon: Marykay Lex, MD;  Location: [facility] INVASIVE CV LAB;  Service: Cardiovascular;  Laterality: N/A;    CORONARY ARTERY BYPASS GRAFT  01/2015   ESOPHAGOGASTRODUODENOSCOPY (EGD) WITH PROPOFOL N/A 09/08/2018   Procedure: ESOPHAGOGASTRODUODENOSCOPY (EGD) WITH PROPOFOL;  Surgeon: West Bali, MD;  Location: [facility];  Service: Endoscopy;  Laterality: N/A;  2:45pm   INGUINAL HERNIA REPAIR Bilateral    ULTRASOUND GUIDANCE FOR VASCULAR ACCESS  02/06/2022   Procedure: ULTRASOUND GUIDANCE FOR VASCULAR ACCESS;  Surgeon: Nada Libman, MD;  Location: [facility] OR;  Service: Vascular;;   VALVE REPLACEMENT  01/2015   Tricuspid Valve Replacement    Current Medications: Current Meds  Medication Sig   albuterol (VENTOLIN HFA) 108 (90 Base) MCG/ACT inhaler Inhale 1-2 puffs into the lungs every 6 (six) hours as needed for wheezing or shortness of breath.   ALPRAZolam (XANAX) 1 MG tablet Take 1 mg by mouth 4 (four) times daily as needed for anxiety.    aspirin EC 81 MG tablet Take 81 mg by mouth daily.    HYDROcodone-acetaminophen (NORCO) 7.5-325 MG tablet Take 1 tablet by mouth every 6 (six) hours as needed for moderate pain.   meclizine (ANTIVERT) 25 MG tablet Take 1 tablet (25 mg total) by mouth 3 (three) times daily as needed for dizziness.   Multiple Vitamins-Minerals (CENTRUM MEN) TABS Take 1 tablet by mouth daily.   sacubitril-valsartan (ENTRESTO) 24-26 MG Take 1 tablet by mouth 2 (two) times daily.   vitamin B-12 (CYANOCOBALAMIN) 1000 MCG tablet Take 1,000 mcg by mouth daily.   [DISCONTINUED] losartan (COZAAR) 50 MG tablet Take 1 tablet (50 mg total) by mouth daily.  Allergies:   Patient has no known allergies.   Social History   Socioeconomic History   Marital status: Divorced    Spouse name: Not on file   Number of children: Not on file   Years of education: Not on file   Highest education level: Not on file  Occupational History   Not on file  Tobacco Use   Smoking status: Every Day    Packs/day: 2.00    Years: 60.00    Total pack years: 120.00    Types: Cigarettes    Last  attempt to quit: 02/11/2015    Years since quitting: 7.6   Smokeless tobacco: Never  Vaping Use   Vaping Use: Never used  Substance and Sexual Activity   Alcohol use: Not Currently    Alcohol/week: 1.0 standard drink of alcohol    Types: 1 Cans of beer per week    Comment: one beer a week   Drug use: No   Sexual activity: Not on file  Other Topics Concern   Not on file  Social History Narrative   Not on file   Social Determinants of Health   Financial Resource Strain: Not on file  Food Insecurity: Not on file  Transportation Needs: Not on file  Physical Activity: Not on file  Stress: Not on file  Social Connections: Not on file     Family History: The patient's family history includes CAD in his brother and brother; Heart attack in his brother, brother, and sister; Heart disease in his father.  ROS:   Review of Systems  Constitution: Negative for decreased appetite, fever and weight gain.  HENT: Negative for congestion, ear discharge, hoarse voice and sore throat.   Eyes: Negative for discharge, redness, vision loss in right eye and visual halos.  Cardiovascular: Negative for chest pain, dyspnea on exertion, leg swelling, orthopnea and palpitations.  Respiratory: Negative for cough, hemoptysis, shortness of breath and snoring.   Endocrine: Negative for heat intolerance and polyphagia.  Hematologic/Lymphatic: Negative for bleeding problem. Does not bruise/bleed easily.  Skin: Negative for flushing, nail changes, rash and suspicious lesions.  Musculoskeletal: Negative for arthritis, joint pain, muscle cramps, myalgias, neck pain and stiffness.  Gastrointestinal: Negative for abdominal pain, bowel incontinence, diarrhea and excessive appetite.  Genitourinary: Negative for decreased libido, genital sores and incomplete emptying.  Neurological: Negative for brief paralysis, focal weakness, headaches and loss of balance.  Psychiatric/Behavioral: Negative for altered mental  status, depression and suicidal ideas.  Allergic/Immunologic: Negative for HIV exposure and persistent infections.    EKGs/Labs/Other Studies Reviewed:    The following studies were reviewed today:   EKG:  The ekg ordered today demonstrates sinus rhythm, heart rate 87 bpm.  08/30/2022  Findings are consistent with prior myocardial infarction. The study is intermediate risk.   1-2 mm downsloping ST depression in the inferior leads   Left ventricular function is abnormal. Global function is mildly reduced. The left ventricular ejection fraction is mildly decreased (45-54%). End diastolic cavity size is normal.   Prior study available for comparison from 11/01/2016.   Basal Anteroseptal fixed defect of moderate size and intensity. No significant changes compared to prior study 11/01/2016    TTE 08/30/2022 IMPRESSIONS   1. Left ventricular ejection fraction, by estimation, is 40 to 45%. The  left ventricle has mildly decreased function. The left ventricle  demonstrates regional wall motion abnormalities (see scoring  diagram/findings for description). Left ventricular  diastolic parameters are consistent with Grade II diastolic dysfunction  (pseudonormalization).  2. Right ventricular systolic function is normal. The right ventricular  size is normal. There is normal pulmonary artery systolic pressure.   3. Left atrial size was moderately dilated.   4. The mitral valve is degenerative. Trivial mitral valve regurgitation.  No evidence of mitral stenosis.   5. The aortic valve is tricuspid. There is mild calcification of the  aortic valve. There is mild thickening of the aortic valve. Aortic valve  regurgitation is not visualized. No aortic stenosis is present.   6. The inferior vena cava is normal in size with greater than 50%  respiratory variability, suggesting right atrial pressure of 3 mmHg.   FINDINGS   Left Ventricle: Left ventricular ejection fraction, by estimation, is 40   to 45%. The left ventricle has mildly decreased function. The left  ventricle demonstrates regional wall motion abnormalities. The left  ventricular internal cavity size was normal  in size. There is no left ventricular hypertrophy. Left ventricular  diastolic parameters are consistent with Grade II diastolic dysfunction  (pseudonormalization). Indeterminate filling pressures.     LV Wall Scoring:  The entire septum and apex are hypokinetic. The entire anterior wall,  entire  lateral wall, and entire inferior wall are normal.   Right Ventricle: The right ventricular size is normal. No increase in  right ventricular wall thickness. Right ventricular systolic function is  normal. There is normal pulmonary artery systolic pressure. The tricuspid  regurgitant velocity is 1.67 m/s, and   with an assumed right atrial pressure of 15 mmHg, the estimated right  ventricular systolic pressure is 26.2 mmHg.   Left Atrium: Left atrial size was moderately dilated.   Right Atrium: Right atrial size was normal in size.   Pericardium: There is no evidence of pericardial effusion.   Mitral Valve: The mitral valve is degenerative in appearance. Mild mitral  annular calcification. Trivial mitral valve regurgitation. No evidence of  mitral valve stenosis.   Tricuspid Valve: The tricuspid valve is normal in structure. Tricuspid  valve regurgitation is trivial. No evidence of tricuspid stenosis.   Aortic Valve: The aortic valve is tricuspid. There is mild calcification  of the aortic valve. There is mild thickening of the aortic valve. Aortic  valve regurgitation is not visualized. No aortic stenosis is present.   Pulmonic Valve: The pulmonic valve was normal in structure. Pulmonic valve  regurgitation is not visualized. No evidence of pulmonic stenosis.   Aorta: The aortic root is normal in size and structure.   Venous: The inferior vena cava is normal in size with greater than 50%   respiratory variability, suggesting right atrial pressure of 3 mmHg.   IAS/Shunts: No atrial level shunt detected by color flow Doppler.   Additional Comments: A device lead is visualize   Recent Labs: 02/05/2022: ALT 12 02/06/2022: Magnesium 2.2 08/01/2022: BUN 16; Creatinine, Ser 0.99; Hemoglobin 15.2; Platelets 154; Potassium 4.9; Sodium 137  Recent Lipid Panel    Component Value Date/Time   CHOL 115 08/13/2022 0927   TRIG 96 08/13/2022 0927   HDL 39 (L) 08/13/2022 0927   CHOLHDL 2.9 08/13/2022 0927   CHOLHDL 4.4 08/14/2018 0353   VLDL 18 08/14/2018 0353   LDLCALC 58 08/13/2022 0927    Physical Exam:    VS:  BP 116/70   Pulse 63   Ht 5\' 6"  (1.676 m)   Wt 129 lb 12.8 oz (58.9 kg)   SpO2 93%   BMI 20.95 kg/m     Wt Readings from Last 3 Encounters:  09/24/22 129 lb 12.8 oz (58.9 kg)  08/30/22 134 lb (60.8 kg)  08/13/22 134 lb 3.2 oz (60.9 kg)     GEN: Well nourished, well developed in no acute distress HEENT: Normal NECK: No JVD; No carotid bruits LYMPHATICS: No lymphadenopathy CARDIAC: S1S2 noted,RRR, no murmurs, rubs, gallops RESPIRATORY:  Clear to auscultation without rales, wheezing or rhonchi  ABDOMEN: Soft, non-tender, non-distended, +bowel sounds, no guarding. EXTREMITIES: No edema, No cyanosis, no clubbing MUSCULOSKELETAL:  No deformity  SKIN: Warm and dry NEUROLOGIC:  Alert and oriented x 3, non-focal PSYCHIATRIC:  Normal affect, good insight  ASSESSMENT:    1. Coronary artery disease involving native coronary artery of native heart without angina pectoris   2. Depressed left ventricular ejection fraction   3. Primary hypertension   4. Mixed hyperlipidemia   5. S/P CABG (coronary artery bypass graft)   6. Medication management     PLAN:    We discussed his testing result.  In terms of his depressed ejection fraction we will stop losartan and transition the patient to [facility].  Continue his atorvastatin.  No anginal symptoms  The patient  was counseled on tobacco cessation today for 5 minutes.  Counseling included reviewing the risks of smoking tobacco products, how it impacts the patient's current medical diagnoses and different strategies for quitting.  Pharmacotherapy to aid in tobacco cessation was not prescribed today. The patient coordinate with  primary care provider.  The patient was also advised to call   1-800-QUIT-NOW (718-802-4677) for additional help with quitting smoking.   The patient is in agreement with the above plan. The patient left the office in stable condition.  The patient will follow up in 6 months.   Medication Adjustments/Labs and Tests Ordered: Current medicines are reviewed at length with the patient today.  Concerns regarding medicines are outlined above.  Orders Placed This Encounter  Procedures   Basic Metabolic Panel (BMET)   Magnesium   Meds ordered this encounter  Medications   sacubitril-valsartan (ENTRESTO) 24-26 MG    Sig: Take 1 tablet by mouth 2 (two) times daily.    Dispense:  180 tablet    Refill:  3    Patient Instructions  Medication Instructions:  Your physician has recommended you make the following change in your medication:  STOP: Losartan  START: Entresto 24-26mg  Twice Daily  *If you need a refill on your cardiac medications before your next appointment, please call your pharmacy*   Lab Work:  Your physician recommends that you have the following labs drawn today in office: BMET and Magnesium.  If you have labs (blood work) drawn today and your tests are completely normal, you will receive your results only by: MyChart Message (if you have MyChart) OR A paper copy in the mail If you have any lab test that is abnormal or we need to change your treatment, we will call you to review the results.   Testing/Procedures: NONE   Follow-Up: At [facility], you and your health needs are our priority.  As part of our continuing mission to provide you with  exceptional heart care, we have created designated Provider Care Teams.  These Care Teams include your primary Cardiologist (physician) and Advanced Practice Providers (APPs -  Physician Assistants and Nurse Practitioners) who all work together to provide you with the care you need, when you need it.  We recommend signing up for the patient portal called "MyChart".  Sign up information is provided on this After Visit Summary.  MyChart is used to connect with patients for Virtual Visits (Telemedicine).  Patients are able to view lab/test results, encounter notes, upcoming appointments, etc.  Non-urgent messages can be sent to your provider as well.   To learn more about what you can do with MyChart, go to [email or web address].    Your next appointment:   4 month(s)  The format for your next appointment:   In Person  Provider:   Thomasene Ripple, DO    Adopting a Healthy Lifestyle.  Know what a healthy weight is for you (roughly BMI <25) and aim to maintain this   Aim for 7+ servings of fruits and vegetables daily   65-80+ fluid ounces of water or unsweet tea for healthy kidneys   Limit to max 1 drink of alcohol per day; avoid smoking/tobacco   Limit animal fats in diet for cholesterol and heart health - choose grass fed whenever available   Avoid highly processed foods, and foods high in saturated/trans fats   Aim for low stress - take time to unwind and care for your mental health   Aim for 150 min of moderate intensity exercise weekly for heart health, and weights twice weekly for bone health   Aim for 7-9 hours of sleep daily   When it comes to diets, agreement about the perfect plan isnt easy to find, even among the experts. Experts at the [facility] [employer] developed an idea known as the Healthy Eating Plate. Just imagine a plate divided into logical, healthy portions.   The emphasis is on diet quality:   Load up on vegetables and fruits - one-half of your  plate: Aim for color and variety, and remember that potatoes dont count.   Go for whole grains - one-quarter of your plate: Whole wheat, barley, wheat berries, quinoa, oats, brown rice, and foods made with them. If you want pasta, go with whole wheat pasta.   Protein power - one-quarter of your plate: Fish, chicken, beans, and nuts are all healthy, versatile protein sources. Limit red meat.   The diet, however, does go beyond the plate, offering a few other suggestions.   Use healthy plant oils, such as olive, canola, soy, corn, sunflower and peanut. Check the labels, and avoid partially hydrogenated oil, which have unhealthy trans fats.   If youre thirsty, drink water. Coffee and tea are good in moderation, but skip sugary drinks and limit milk and dairy products to one or two daily servings.   The type of carbohydrate in the diet is more important than the amount. Some sources of carbohydrates, such as vegetables, fruits, whole grains, and beans-are healthier than others.   Finally, stay active  Signed, Thomasene Ripple, DO  09/24/2022 12:21 PM    [facility]

## 2022-12-02 ENCOUNTER — Ambulatory Visit: Payer: Medicare HMO | Admitting: Cardiology

## 2023-01-24 ENCOUNTER — Ambulatory Visit: Payer: Medicare HMO | Attending: Cardiology | Admitting: Cardiology

## 2023-10-23 ENCOUNTER — Other Ambulatory Visit (HOSPITAL_COMMUNITY): Payer: Self-pay | Admitting: Nurse Practitioner

## 2023-10-23 ENCOUNTER — Encounter (INDEPENDENT_AMBULATORY_CARE_PROVIDER_SITE_OTHER): Payer: Self-pay | Admitting: *Deleted

## 2023-10-23 DIAGNOSIS — F17210 Nicotine dependence, cigarettes, uncomplicated: Secondary | ICD-10-CM

## 2023-11-19 ENCOUNTER — Other Ambulatory Visit (HOSPITAL_COMMUNITY): Payer: Self-pay | Admitting: Family Medicine

## 2023-11-19 DIAGNOSIS — I7143 Infrarenal abdominal aortic aneurysm, without rupture: Secondary | ICD-10-CM

## 2023-11-24 ENCOUNTER — Encounter (HOSPITAL_COMMUNITY): Payer: Self-pay

## 2023-11-24 ENCOUNTER — Ambulatory Visit (HOSPITAL_COMMUNITY): Payer: Medicare PPO

## 2024-01-15 DIAGNOSIS — F419 Anxiety disorder, unspecified: Secondary | ICD-10-CM | POA: Diagnosis not present

## 2024-01-15 DIAGNOSIS — G894 Chronic pain syndrome: Secondary | ICD-10-CM | POA: Diagnosis not present

## 2024-01-15 DIAGNOSIS — R110 Nausea: Secondary | ICD-10-CM | POA: Diagnosis not present

## 2024-01-25 ENCOUNTER — Emergency Department (HOSPITAL_COMMUNITY)

## 2024-01-25 ENCOUNTER — Other Ambulatory Visit: Payer: Self-pay

## 2024-01-25 ENCOUNTER — Encounter (HOSPITAL_COMMUNITY): Payer: Self-pay

## 2024-01-25 ENCOUNTER — Emergency Department (HOSPITAL_COMMUNITY)
Admission: EM | Admit: 2024-01-25 | Discharge: 2024-01-25 | Disposition: A | Discharge status: Home / Self Care | Attending: Emergency Medicine | Admitting: Emergency Medicine

## 2024-01-25 DIAGNOSIS — I2510 Atherosclerotic heart disease of native coronary artery without angina pectoris: Secondary | ICD-10-CM | POA: Insufficient documentation

## 2024-01-25 DIAGNOSIS — J449 Chronic obstructive pulmonary disease, unspecified: Secondary | ICD-10-CM | POA: Insufficient documentation

## 2024-01-25 DIAGNOSIS — Z7951 Long term (current) use of inhaled steroids: Secondary | ICD-10-CM | POA: Diagnosis not present

## 2024-01-25 DIAGNOSIS — R1032 Left lower quadrant pain: Secondary | ICD-10-CM | POA: Insufficient documentation

## 2024-01-25 DIAGNOSIS — Z7982 Long term (current) use of aspirin: Secondary | ICD-10-CM | POA: Diagnosis not present

## 2024-01-25 DIAGNOSIS — K828 Other specified diseases of gallbladder: Secondary | ICD-10-CM | POA: Diagnosis not present

## 2024-01-25 DIAGNOSIS — R1030 Lower abdominal pain, unspecified: Secondary | ICD-10-CM

## 2024-01-25 DIAGNOSIS — I714 Abdominal aortic aneurysm, without rupture, unspecified: Secondary | ICD-10-CM | POA: Diagnosis not present

## 2024-01-25 LAB — CBC WITH DIFFERENTIAL/PLATELET
Abs Immature Granulocytes: 0.02 10*3/uL (ref 0.00–0.07)
Basophils Absolute: 0 10*3/uL (ref 0.0–0.1)
Basophils Relative: 1 %
Eosinophils Absolute: 0.1 10*3/uL (ref 0.0–0.5)
Eosinophils Relative: 1 %
HCT: 43 % (ref 39.0–52.0)
Hemoglobin: 14.4 g/dL (ref 13.0–17.0)
Immature Granulocytes: 0 %
Lymphocytes Relative: 33 %
Lymphs Abs: 2.6 10*3/uL (ref 0.7–4.0)
MCH: 35.6 pg — ABNORMAL HIGH (ref 26.0–34.0)
MCHC: 33.5 g/dL (ref 30.0–36.0)
MCV: 106.4 fL — ABNORMAL HIGH (ref 80.0–100.0)
Monocytes Absolute: 0.8 10*3/uL (ref 0.1–1.0)
Monocytes Relative: 10 %
Neutro Abs: 4.2 10*3/uL (ref 1.7–7.7)
Neutrophils Relative %: 55 %
Platelets: 121 10*3/uL — ABNORMAL LOW (ref 150–400)
RBC: 4.04 MIL/uL — ABNORMAL LOW (ref 4.22–5.81)
RDW: 12.5 % (ref 11.5–15.5)
WBC: 7.7 10*3/uL (ref 4.0–10.5)
nRBC: 0 % (ref 0.0–0.2)

## 2024-01-25 LAB — COMPREHENSIVE METABOLIC PANEL WITH GFR
ALT: 12 U/L (ref 0–44)
AST: 25 U/L (ref 15–41)
Albumin: 3.9 g/dL (ref 3.5–5.0)
Alkaline Phosphatase: 54 U/L (ref 38–126)
Anion gap: 8 (ref 5–15)
BUN: 17 mg/dL (ref 8–23)
CO2: 27 mmol/L (ref 22–32)
Calcium: 9.7 mg/dL (ref 8.9–10.3)
Chloride: 101 mmol/L (ref 98–111)
Creatinine, Ser: 1.07 mg/dL (ref 0.61–1.24)
GFR, Estimated: >60 mL/min (ref >60)
Glucose, Bld: 86 mg/dL (ref 70–99)
Potassium: 5.1 mmol/L (ref 3.5–5.1)
Sodium: 136 mmol/L (ref 135–145)
Total Bilirubin: 0.7 mg/dL (ref 0.0–1.2)
Total Protein: 7.3 g/dL (ref 6.5–8.1)

## 2024-01-25 LAB — URINALYSIS, ROUTINE W REFLEX MICROSCOPIC
Bilirubin Urine: NEGATIVE
Glucose, UA: NEGATIVE mg/dL
Hgb urine dipstick: NEGATIVE
Ketones, ur: NEGATIVE mg/dL
Leukocytes,Ua: NEGATIVE
Nitrite: NEGATIVE
Protein, ur: NEGATIVE mg/dL
Specific Gravity, Urine: 1.01 (ref 1.005–1.030)
pH: 6 (ref 5.0–8.0)

## 2024-01-25 LAB — LIPASE, BLOOD: Lipase: 31 U/L (ref 11–51)

## 2024-01-25 MED ORDER — IOHEXOL 300 MG/ML  SOLN
100.0000 mL | Freq: Once | INTRAMUSCULAR | Status: AC | PRN
  Administered 2024-01-25: 100 mL via INTRAVENOUS

## 2024-01-25 NOTE — ED Provider Notes (Signed)
 [place] EMERGENCY DEPARTMENT AT [facility] Provider Note   CSN: [id number] Arrival date & time: 01/25/24  1313     History {Add pertinent medical, surgical, social history, OB history to HPI:1} Chief Complaint  Patient presents with   Abdominal Pain    [name] is a 73 y.o. male.   Abdominal Pain Associated symptoms: no chest pain, no chills, no cough, no diarrhea, no fever, no nausea, no shortness of breath and no vomiting        [name] is a 73 y.o. male past medical history of anxiety, coronary artery disease, COPD and history of abdominal aortic aneurysm.  he presents to the Emergency Department complaining of left mid to lower abdominal pain.  Pain has been present for 2 weeks gradually worsening.  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  He feels a burning sensation of his lower abdomen.  Denies any dysuria, bowel changes, fever, chills, chest pain shortness of breath or vomiting.  Pain worsens when lying down.  Pain is not aggravated by food intake.  Home Medications Prior to Admission medications   Medication Sig Start Date End Date Taking? Authorizing Provider  albuterol (VENTOLIN HFA) 108 (90 Base) MCG/ACT inhaler Inhale 1-2 puffs into the lungs every 6 (six) hours as needed for wheezing or shortness of breath. 08/29/20   Jeannie Fend, PA-C  ALPRAZolam Prudy Feeler) 1 MG tablet Take 1 mg by mouth 4 (four) times daily as needed for anxiety.  09/17/16   [provider]  aspirin EC 81 MG tablet Take 81 mg by mouth daily.     [provider]  HYDROcodone-acetaminophen (NORCO) 7.5-325 MG tablet Take 1 tablet by mouth every 6 (six) hours as needed for moderate pain. 02/07/22   Rhyne, Ames Coupe, PA-C  meclizine (ANTIVERT) 25 MG tablet Take 1 tablet (25 mg total) by mouth 3 (three) times daily as needed for dizziness. 04/27/21   Carroll Sage,  PA-C  Multiple Vitamins-Minerals (CENTRUM MEN) TABS Take 1 tablet by mouth daily.    [provider]  sacubitril-valsartan (ENTRESTO) 24-26 MG Take 1 tablet by mouth 2 (two) times daily. 09/24/22   Tobb, Kardie, DO  vitamin B-12 (CYANOCOBALAMIN) 1000 MCG tablet Take 1,000 mcg by mouth daily.    [provider]      Allergies    Patient has no known allergies.    Review of Systems   Review of Systems  Constitutional:  Negative for appetite change, chills and fever.  Respiratory:  Negative for cough and shortness of breath.   Cardiovascular:  Negative for chest pain.  Gastrointestinal:  Positive for abdominal pain. Negative for diarrhea, nausea and vomiting.  Genitourinary:  Negative for flank pain.  Musculoskeletal:  Negative for arthralgias, back pain and neck pain.  Neurological:  Negative for dizziness, weakness and numbness.  Psychiatric/Behavioral:  Negative for confusion.     Physical Exam Updated Vital Signs BP 130/65   Pulse 61   Temp 98.1 F (36.7 C) (Oral)   Ht 5\' 6"  (1.676 m)   Wt 59.4 kg   SpO2 98%   BMI 21.14 kg/m  Physical Exam  ED Results / Procedures / Treatments   Labs (all labs ordered are listed, but only abnormal results are displayed) Labs Reviewed - No data to display  EKG None  Radiology No results found.  Procedures Procedures  {Document cardiac monitor,  telemetry assessment procedure when appropriate:1}  Medications Ordered in ED Medications - No data to display  ED Course/ Medical Decision Making/ A&P   {   Click here for ABCD2, HEART and other calculatorsREFRESH Note before signing :1}                              Medical Decision Making Amount and/or Complexity of Data Reviewed Labs: ordered. Radiology: ordered.   ***  {Document critical care time when appropriate:1} {Document review of labs and clinical decision tools ie heart score, Chads2Vasc2 etc:1}  {Document your independent review of radiology  images, and any outside records:1} {Document your discussion with family members, caretakers, and with consultants:1} {Document social determinants of health affecting pt's care:1} {Document your decision making why or why not admission, treatments were needed:1} Final Clinical Impression(s) / ED Diagnoses Final diagnoses:  None    Rx / DC Orders ED Discharge Orders     None

## 2024-01-25 NOTE — Discharge Instructions (Signed)
 The cause of your left-sided abdominal pain is unclear at this time.  I recommend that you contact your primary care provider to arrange follow-up appointment for this week.  Also, as discussed I would recommend that you have a repeat colonoscopy.  Your primary care provider can arrange this for you or you may contact the GI provider listed to arrange follow-up appointment for possible colonoscopy.  Return to the emergency department if you develop any new or worsening symptoms.

## 2024-01-25 NOTE — ED Triage Notes (Signed)
 Pt stated he has something that "tore loose" that started about a month ago. Pt stated that it feels like a burning sensation in his RLQ. No n/v/d

## 2024-04-18 ENCOUNTER — Ambulatory Visit
Admission: RE | Admit: 2024-04-18 | Discharge: 2024-04-18 | Disposition: A | Discharge status: Home / Self Care | Payer: Self-pay | Source: Ambulatory Visit | Attending: Family Medicine | Admitting: Family Medicine

## 2024-04-18 ENCOUNTER — Other Ambulatory Visit: Payer: Self-pay

## 2024-04-18 VITALS — BP 112/55 | HR 76 | Temp 98.2°F | Resp 20

## 2024-04-18 DIAGNOSIS — S39012A Strain of muscle, fascia and tendon of lower back, initial encounter: Secondary | ICD-10-CM

## 2024-04-18 MED ORDER — NAPROXEN 375 MG PO TABS
375.0000 mg | ORAL_TABLET | Freq: Two times a day (BID) | ORAL | 0 refills | Status: AC | PRN
Start: 2024-04-18 — End: ?

## 2024-04-18 MED ORDER — TIZANIDINE HCL 2 MG PO CAPS: 2.0000 mg | ORAL_CAPSULE | Freq: Three times a day (TID) | ORAL | 0 refills | Status: AC | PRN

## 2024-04-18 NOTE — ED Provider Notes (Signed)
 [facility]    CSN: [id number] Arrival date & time: 04/18/24  1041      History   Chief Complaint Chief Complaint  Patient presents with   Back Pain    Entered by patient    HPI [name] is a 73 y.o. male.   Patient presenting today with 1 month history of bilateral lower back pain.  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  Denies decreased range of motion, known injury to the area, significant radiation of pain down legs, numbness, tingling, weakness, bowel or bladder incontinence, saddle anesthesias, fevers, urinary symptoms.  No past history of similar.  So far trying topical muscle sprays with no relief.    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   100% LAD, Severe dom RCA dz, small non-dom Cx --> CABG x 2 LIMA-LAD, SVG-RCA   MI (myocardial infarction) (HCC)    Pulmonary nodule    Benign   Status post tricuspid valve repair     Patient Active Problem List   Diagnosis Date Noted   Ischemic cardiomyopathy 08/13/2022   Hyperlipidemia 08/13/2022   ERRONEOUS ENCOUNTER--DISREGARD 07/25/2022   AAA (abdominal aortic aneurysm) (HCC) 02/06/2022   Trigeminal neuralgia of right side of face 04/13/2020   Pulmonary nodule 08/14/2018   Unstable angina (HCC)    Gastroesophageal reflux disease    Nausea without vomiting    Elevated MCV 05/24/2017   COPD (chronic obstructive pulmonary disease) (HCC) 05/23/2017   Anxiety 05/23/2017   Angina, class II (HCC) 11/11/2016   Abnormal nuclear stress test 11/11/2016   Chest pain, non-cardiac 10/09/2016   Coronary artery disease involving native heart with angina pectoris (HCC) 10/09/2016   Tobacco abuse 10/09/2016   Pure  hypercholesterolemia 03/21/2015   S/P CABG (coronary artery bypass graft) 03/13/2015   AAA (abdominal aortic aneurysm) without rupture (HCC) 03/01/2014    Past Surgical History:  Procedure Laterality Date   ABDOMINAL AORTIC ENDOVASCULAR STENT GRAFT N/A 02/06/2022   Procedure: ENDOVASUCLAR REPAIR OF ABDOMINAL AORTIC  ANEURSYM;  Surgeon: Serene Gaile ORN, MD;  Location: [facility] OR;  Service: Vascular;  Laterality: N/A;   BIOPSY  09/08/2018   Procedure: BIOPSY;  Surgeon: Harvey Margo CROME, MD;  Location: [facility];  Service: Endoscopy;;  gastric   CARDIAC CATHETERIZATION  01/2015   Coronary angiography on 02/17/15 demonstrated the left main coronary artery to have mild 30-40% stenosis, 100% LAD stenosis with right to left collaterals and faint left to left collaterals, small nondominant left circumflex, small to medium caliber first obtuse marginal branch with 20% ostial and proximal stenosis. The RCA was noted to have diffuse disease.   CARDIAC CATHETERIZATION N/A 11/11/2016   Procedure: Left Heart Cath and Cors/Grafts Angiography;  Surgeon: Alm ORN Clay, MD;  Location: [facility] INVASIVE CV LAB;  Service: Cardiovascular;  Laterality: N/A;   CORONARY ARTERY BYPASS GRAFT  01/2015   ESOPHAGOGASTRODUODENOSCOPY (EGD) WITH PROPOFOL  N/A 09/08/2018   Procedure: ESOPHAGOGASTRODUODENOSCOPY (EGD) WITH PROPOFOL ;  Surgeon: Harvey Margo CROME, MD;  Location: [facility];  Service: Endoscopy;  Laterality: N/A;  2:45pm   INGUINAL HERNIA REPAIR Bilateral    ULTRASOUND GUIDANCE FOR VASCULAR ACCESS  02/06/2022  Procedure: ULTRASOUND GUIDANCE FOR VASCULAR ACCESS;  Surgeon: Serene Gaile ORN, MD;  Location: [facility];  Service: Vascular;;   VALVE REPLACEMENT  01/2015   Tricuspid Valve Replacement       Home Medications    Prior to Admission medications   Medication Sig Start Date End Date Taking? Authorizing Provider  naproxen (NAPROSYN) 375 MG tablet Take 1 tablet (375 mg total) by mouth 2 (two) times daily as needed.  04/18/24  Yes Stuart Vernell Norris, PA-C  tizanidine (ZANAFLEX) 2 MG capsule Take 1 capsule (2 mg total) by mouth 3 (three) times daily as needed for muscle spasms. Do not drink alcohol or drive while taking this medication.  May cause drowsiness. 04/18/24  Yes Stuart Vernell Norris, PA-C  albuterol  (VENTOLIN  HFA) 108 [id number] Base) MCG/ACT inhaler Inhale 1-2 puffs into the lungs every 6 (six) hours as needed for wheezing or shortness of breath. 08/29/20   Beverley Leita LABOR, PA-C  ALPRAZolam  (XANAX ) 1 MG tablet Take 1 mg by mouth 4 (four) times daily as needed for anxiety.  09/17/16   [provider]  aspirin  EC 81 MG tablet Take 81 mg by mouth daily.     [provider]  HYDROcodone -acetaminophen  (NORCO) 7.5-325 MG tablet Take 1 tablet by mouth every 6 (six) hours as needed for moderate pain. 02/07/22   Rhyne, Samantha J, PA-C  meclizine  (ANTIVERT ) 25 MG tablet Take 1 tablet (25 mg total) by mouth 3 (three) times daily as needed for dizziness. 04/27/21   Waylan Elsie PARAS, PA-C  Multiple Vitamins-Minerals (CENTRUM MEN) TABS Take 1 tablet by mouth daily.    [provider]  sacubitril -valsartan  (ENTRESTO ) 24-26 MG Take 1 tablet by mouth 2 (two) times daily. 09/24/22   Tobb, Kardie, DO  vitamin B-12 (CYANOCOBALAMIN ) 1000 MCG tablet Take 1,000 mcg by mouth daily.    [provider]    Family History Family History  Problem Relation Age of Onset   Heart disease Father    Heart attack Sister        Died in her 36s   CAD Brother    Heart attack Brother    CAD Brother    Heart attack Brother     Social History Social History   Tobacco Use   Smoking status: Every Day    Current packs/day: 0.00    Average packs/day: 2.0 packs/day for 60.0 years (120.0 ttl pk-yrs)    Types: Cigarettes    Start date: 02/11/1955    Last attempt to quit: 02/11/2015    Years since quitting: 9.1   Smokeless tobacco: Never  Vaping Use   Vaping status: Never Used  Substance Use Topics    Alcohol use: Not Currently    Alcohol/week: 1.0 standard drink of alcohol    Types: 1 Cans of beer per week    Comment: one beer a week   Drug use: No     Allergies   Patient has no known allergies.   Review of Systems Review of Systems Per HPI  Physical Exam Triage Vital Signs ED Triage Vitals  Encounter Vitals Group     BP 04/18/24 1123 (!) 112/55     Girls Systolic BP Percentile --      Girls Diastolic BP Percentile --      Boys Systolic BP Percentile --      Boys Diastolic BP Percentile --      Pulse Rate 04/18/24 1123 76     Resp 04/18/24 1123 20  Temp 04/18/24 1123 98.2 F (36.8 C)     Temp Source 04/18/24 1123 Oral     SpO2 04/18/24 1123 94 %     Weight --      Height --      Head Circumference --      Peak Flow --      Pain Score 04/18/24 1126 9     Pain Loc --      Pain Education --      Exclude from Growth Chart --    No data found.  Updated Vital Signs BP (!) 112/55 (BP Location: Right Arm)   Pulse 76   Temp 98.2 F (36.8 C) (Oral)   Resp 20   SpO2 94%   Visual Acuity Right Eye Distance:   Left Eye Distance:   Bilateral Distance:    Right Eye Near:   Left Eye Near:    Bilateral Near:     Physical Exam Vitals and nursing note reviewed.  Constitutional:      Appearance: Normal appearance.  HENT:     Head: Atraumatic.   Eyes:     Extraocular Movements: Extraocular movements intact.     Conjunctiva/sclera: Conjunctivae normal.    Cardiovascular:     Rate and Rhythm: Normal rate.  Pulmonary:     Effort: Pulmonary effort is normal.   Musculoskeletal:        General: Tenderness present. No swelling or signs of injury. Normal range of motion.     Cervical back: Normal range of motion and neck supple.     Comments: No midline spinal tenderness to palpation diffusely.  Negative straight leg raise bilateral lower extremities.  Normal gait and range of motion.  Tenderness to palpation of bilateral lumbar paraspinal muscles and  lateral lumbar musculature   Skin:    General: Skin is warm and dry.     Findings: No bruising or erythema.   Neurological:     General: No focal deficit present.     Mental Status: He is oriented to person, place, and time.     Comments: Bilateral lower extremities neurovascularly intact  Psychiatric:        Mood and Affect: Mood normal.        Thought Content: Thought content normal.        Judgment: Judgment normal.      UC Treatments / Results  Labs (all labs ordered are listed, but only abnormal results are displayed) Labs Reviewed - No data to display  EKG   Radiology No results found.  Procedures Procedures (including critical care time)  Medications Ordered in UC Medications - No data to display  Initial Impression / Assessment and Plan / UC Course  I have reviewed the triage vital signs and the nursing notes.  Pertinent labs & imaging results that were available during my care of the patient were reviewed by me and considered in my medical decision making (see chart for details).     Vitals and exam very reassuring today with no red flag findings.  Suspect muscular strain to the lumbar region.  Treat with naproxen, Zanaflex, heat, massage, stretches, rest.  Return for worsening symptoms.  Final Clinical Impressions(s) / UC Diagnoses   Final diagnoses:  Strain of lumbar region, initial encounter   Discharge Instructions   None    ED Prescriptions     Medication Sig Dispense Auth. Provider   tizanidine (ZANAFLEX) 2 MG capsule Take 1 capsule (2 mg total) by mouth 3 (three)  times daily as needed for muscle spasms. Do not drink alcohol or drive while taking this medication.  May cause drowsiness. 15 capsule Stuart Vernell Norris, PA-C   naproxen (NAPROSYN) 375 MG tablet Take 1 tablet (375 mg total) by mouth 2 (two) times daily as needed. 20 tablet Stuart Vernell Norris, [place]      PDMP not reviewed this encounter.   Stuart Vernell Norris,  [place] 04/18/24 1207

## 2024-04-18 NOTE — ED Triage Notes (Signed)
 Pt reports lower back pain x1 month. Reports intermittent radiation of pain to lower extremities. 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

## 2024-04-20 DIAGNOSIS — I10 Essential (primary) hypertension: Secondary | ICD-10-CM | POA: Diagnosis not present

## 2024-04-21 ENCOUNTER — Encounter (INDEPENDENT_AMBULATORY_CARE_PROVIDER_SITE_OTHER): Payer: Self-pay | Admitting: *Deleted

## 2024-04-28 DIAGNOSIS — I119 Hypertensive heart disease without heart failure: Secondary | ICD-10-CM | POA: Diagnosis not present

## 2024-04-28 DIAGNOSIS — D696 Thrombocytopenia, unspecified: Secondary | ICD-10-CM | POA: Diagnosis not present

## 2024-04-28 DIAGNOSIS — R944 Abnormal results of kidney function studies: Secondary | ICD-10-CM | POA: Diagnosis not present

## 2024-04-28 DIAGNOSIS — I10 Essential (primary) hypertension: Secondary | ICD-10-CM | POA: Diagnosis not present

## 2024-04-28 DIAGNOSIS — F331 Major depressive disorder, recurrent, moderate: Secondary | ICD-10-CM | POA: Diagnosis not present

## 2024-04-28 DIAGNOSIS — I25810 Atherosclerosis of coronary artery bypass graft(s) without angina pectoris: Secondary | ICD-10-CM | POA: Diagnosis not present

## 2024-04-28 DIAGNOSIS — G894 Chronic pain syndrome: Secondary | ICD-10-CM | POA: Diagnosis not present

## 2024-04-28 DIAGNOSIS — F17210 Nicotine dependence, cigarettes, uncomplicated: Secondary | ICD-10-CM | POA: Diagnosis not present

## 2024-04-28 DIAGNOSIS — F419 Anxiety disorder, unspecified: Secondary | ICD-10-CM | POA: Diagnosis not present

## 2024-05-14 DIAGNOSIS — F17210 Nicotine dependence, cigarettes, uncomplicated: Secondary | ICD-10-CM | POA: Diagnosis not present

## 2024-05-14 DIAGNOSIS — Z20822 Contact with and (suspected) exposure to covid-19: Secondary | ICD-10-CM | POA: Diagnosis not present

## 2024-05-14 DIAGNOSIS — J069 Acute upper respiratory infection, unspecified: Secondary | ICD-10-CM | POA: Diagnosis not present

## 2024-05-14 DIAGNOSIS — R051 Acute cough: Secondary | ICD-10-CM | POA: Diagnosis not present

## 2024-06-16 ENCOUNTER — Other Ambulatory Visit (HOSPITAL_COMMUNITY): Payer: Self-pay

## 2024-06-16 DIAGNOSIS — R1032 Left lower quadrant pain: Secondary | ICD-10-CM

## 2024-06-17 ENCOUNTER — Ambulatory Visit (HOSPITAL_COMMUNITY)
Admission: RE | Admit: 2024-06-17 | Discharge: 2024-06-17 | Disposition: A | Discharge status: Home / Self Care | Source: Ambulatory Visit

## 2024-06-17 DIAGNOSIS — R1032 Left lower quadrant pain: Secondary | ICD-10-CM | POA: Insufficient documentation

## 2024-06-17 DIAGNOSIS — I714 Abdominal aortic aneurysm, without rupture, unspecified: Secondary | ICD-10-CM | POA: Diagnosis not present

## 2024-06-17 MED ORDER — IOHEXOL 300 MG/ML  SOLN
100.0000 mL | Freq: Once | INTRAMUSCULAR | Status: AC | PRN
  Administered 2024-06-17: 100 mL via INTRAVENOUS

## 2024-06-30 ENCOUNTER — Encounter: Payer: Self-pay | Admitting: Internal Medicine

## 2024-07-21 ENCOUNTER — Encounter: Payer: Self-pay | Admitting: Internal Medicine

## 2024-07-21 ENCOUNTER — Ambulatory Visit: Admitting: Internal Medicine

## 2024-07-21 ENCOUNTER — Telehealth: Payer: Self-pay | Admitting: *Deleted

## 2024-07-21 VITALS — BP 110/66 | HR 83 | Temp 97.8°F | Ht 66.0 in | Wt 122.1 lb

## 2024-07-21 DIAGNOSIS — Z1211 Encounter for screening for malignant neoplasm of colon: Secondary | ICD-10-CM

## 2024-07-21 DIAGNOSIS — R948 Abnormal results of function studies of other organs and systems: Secondary | ICD-10-CM

## 2024-07-21 DIAGNOSIS — R1032 Left lower quadrant pain: Secondary | ICD-10-CM

## 2024-07-21 DIAGNOSIS — K5900 Constipation, unspecified: Secondary | ICD-10-CM | POA: Diagnosis not present

## 2024-07-21 NOTE — Progress Notes (Signed)
 Primary Care Physician:  Joeann Browning, FNP Primary Gastroenterologist:  Dr. Cindie  Chief Complaint  Patient presents with   Abdominal Pain    Pt arrives due LLQ pain. Pt states pain has been going on about 2 months. Had CT scan completed in August. No n/v, constipation or diarrhea. 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     HPI:   [name] is a 73 y.o. male who presents to the clinic today by referral from his PCP Lauraine Lunger for evaluation.  Patient states for the last 2 to 3 months he has had intermittent left lower quadrant abdominal pain.  Describes it as a burning sensation.  Does not radiate.  Denies any associated melena or hematochezia.  Does report mild constipation, occasional straining.  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  CT abdomen pelvis with contrast 06/17/2024 which I personally reviewed showed moderate stool burden, otherwise unremarkable from a GI standpoint.  Last colonoscopy he states was approximately 12 years ago.  Had a PET scan to evaluate lung nodule 08/29/2022 which I also reviewed which showed hypermetabolism in the ascending colon/cecum most likely physiologic.  Denies any family history of colon cancer.  Denies any upper GI symptoms including heartburn, reflux, dysphagia/odynophagia, epigastric or chest pain.  Does have a significant cardiac history including ischemic cardiomyopathy (LVEF of 40 to 45%), CAD status post CABG, tricuspid valve repair, abdominal aortic aneurysm status post endovascular stent graft 2023.  Denies any chest pain or dyspnea.  No lower extremity swelling.   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   100% LAD, Severe dom RCA dz, small non-dom Cx  --> CABG x 2 LIMA-LAD, SVG-RCA   MI (myocardial infarction) (HCC)    Pulmonary nodule    Benign   Status post tricuspid valve repair     Past Surgical History:  Procedure Laterality Date   ABDOMINAL AORTIC ENDOVASCULAR STENT GRAFT N/A 02/06/2022   Procedure: ENDOVASUCLAR REPAIR OF ABDOMINAL AORTIC  ANEURSYM;  Surgeon: Serene Gaile ORN, MD;  Location: [facility] OR;  Service: Vascular;  Laterality: N/A;   BIOPSY  09/08/2018   Procedure: BIOPSY;  Surgeon: Harvey Margo CROME, MD;  Location: [facility];  Service: Endoscopy;;  gastric   CARDIAC CATHETERIZATION  01/2015   Coronary angiography on 02/17/15 demonstrated the left main coronary artery to have mild 30-40% stenosis, 100% LAD stenosis with right to left collaterals and faint left to left collaterals, small nondominant left circumflex, small to medium caliber first obtuse marginal branch with 20% ostial and proximal stenosis. The RCA was noted to have diffuse disease.   CARDIAC CATHETERIZATION N/A 11/11/2016   Procedure: Left Heart Cath and Cors/Grafts Angiography;  Surgeon: Alm ORN Clay, MD;  Location: [facility] INVASIVE CV LAB;  Service: Cardiovascular;  Laterality: N/A;   CORONARY ARTERY BYPASS GRAFT  01/2015   ESOPHAGOGASTRODUODENOSCOPY (EGD) WITH PROPOFOL  N/A 09/08/2018   Procedure: ESOPHAGOGASTRODUODENOSCOPY (EGD) WITH PROPOFOL ;  Surgeon: Harvey Margo CROME, MD;  Location: [facility];  Service: Endoscopy;  Laterality: N/A;  2:45pm   INGUINAL HERNIA REPAIR Bilateral    ULTRASOUND GUIDANCE FOR VASCULAR ACCESS  02/06/2022   Procedure: ULTRASOUND GUIDANCE FOR VASCULAR ACCESS;  Surgeon: Serene Gaile ORN, MD;  Location: [facility] OR;  Service: Vascular;;   VALVE REPLACEMENT  01/2015   Tricuspid Valve Replacement    Current Outpatient Medications  Medication Sig Dispense Refill   albuterol  (VENTOLIN  HFA) 108 (90 Base) MCG/ACT inhaler Inhale 1-2 puffs into the lungs every 6 (six) hours as needed for wheezing or shortness of breath. 18 g 0   ALPRAZolam  (XANAX )  1 MG tablet Take 1 mg by mouth 4 (four) times daily as needed for anxiety.   2   aspirin  EC 81 MG tablet Take 81 mg by mouth daily.      HYDROcodone -acetaminophen  (NORCO) 7.5-325 MG tablet Take 1 tablet by mouth every 6 (six) hours as needed for moderate pain. 8 tablet 0   meclizine  (ANTIVERT ) 25 MG tablet Take 1 tablet (25 mg total) by mouth 3 (three) times daily as needed for dizziness. 30 tablet 0   Multiple Vitamins-Minerals (CENTRUM MEN) TABS Take 1 tablet by mouth daily.     naproxen  (NAPROSYN ) 375 MG tablet Take 1 tablet (375 mg total) by mouth 2 (two) times daily as needed. 20 tablet 0   sacubitril -valsartan  (ENTRESTO ) 24-26 MG Take 1 tablet by mouth 2 (two) times daily. 180 tablet 3   tizanidine  (ZANAFLEX ) 2 MG capsule Take 1 capsule (2 mg total) by mouth 3 (three) times daily as needed for muscle spasms. Do not drink alcohol or drive while taking this medication.  May cause drowsiness. 15 capsule 0   vitamin B-12 (CYANOCOBALAMIN ) 1000 MCG tablet Take 1,000 mcg by mouth daily.     No current facility-administered medications for this visit.    Allergies as of 07/21/2024   (No Known Allergies)    Family History  Problem Relation Age of Onset   Heart disease Father    Heart attack Sister        Died in her 70s   CAD Brother    Heart attack Brother    CAD Brother    Heart attack Brother     Social History   Socioeconomic History   Marital status: Divorced    Spouse name: Not on file   Number of children: Not on file   Years of education: Not on file   Highest education level: Not on file  Occupational History   Not on file  Tobacco Use   Smoking status: Every Day    Current packs/day: 0.00    Average packs/day: 2.0 packs/day for 60.0 years (120.0 ttl pk-yrs)    Types: Cigarettes    Start date: 02/11/1955    Last attempt to quit: 02/11/2015    Years since quitting: 9.4   Smokeless tobacco: Never  Vaping Use   Vaping status: Never Used  Substance and Sexual Activity    Alcohol use: Not Currently    Alcohol/week: 1.0 standard drink of alcohol    Types: 1 Cans of beer per week    Comment: one beer a week   Drug use: No   Sexual activity: Not on file  Other Topics Concern   Not on file  Social History Narrative   Not on file   Social Drivers of Health   Financial Resource Strain: Not on file  Food Insecurity: Not on file  Transportation Needs: Not on file  Physical Activity: Not on file  Stress: Not on file  Social Connections: Not on file  Intimate Partner Violence: Not on file    Subjective: Review of Systems  Constitutional:  Negative for chills and fever.  HENT:  Negative for  congestion and hearing loss.   Eyes:  Negative for blurred vision and double vision.  Respiratory:  Negative for cough and shortness of breath.   Cardiovascular:  Negative for chest pain and palpitations.  Gastrointestinal:  Positive for abdominal pain and constipation. Negative for blood in stool, diarrhea, heartburn, melena and vomiting.  Genitourinary:  Negative for dysuria and urgency.  Musculoskeletal:  Negative for joint pain and myalgias.  Skin:  Negative for itching and rash.  Neurological:  Negative for dizziness and headaches.  Psychiatric/Behavioral:  Negative for depression. The patient is not nervous/anxious.        Objective: BP 110/66   Pulse 83   Temp 97.8 F (36.6 C)   Ht 5' 6 (1.676 m)   Wt 122 lb 1.6 oz (55.4 kg)   BMI 19.71 kg/m  Physical Exam Constitutional:      Appearance: Normal appearance.  HENT:     Head: Normocephalic and atraumatic.  Eyes:     Extraocular Movements: Extraocular movements intact.     Conjunctiva/sclera: Conjunctivae normal.  Cardiovascular:     Rate and Rhythm: Normal rate and regular rhythm.  Pulmonary:     Effort: Pulmonary effort is normal.     Breath sounds: Normal breath sounds.  Abdominal:     General: Bowel sounds are normal.     Palpations: Abdomen is soft.  Musculoskeletal:         General: Normal range of motion.     Cervical back: Normal range of motion and neck supple.  Skin:    General: Skin is warm.  Neurological:     General: No focal deficit present.     Mental Status: He is alert and oriented to person, place, and time.  Psychiatric:        Mood and Affect: Mood normal.        Behavior: Behavior normal.      Assessment: *Left lower quadrant abdominal pain *Abnormal PET scan of colon *Constipation-mild *Colon cancer screening  Plan: Etiology of patient's symptoms unclear.  Does have mild constipation findings on CT imaging otherwise unremarkable.  Abnormal PET scan 2023 of the ascending colon/cecum likely physiologic though needs to be evaluated.  Also overdue for colonoscopy from a colon cancer screening standpoint.  Will schedule for colonoscopy.The risks including infection, bleed, or perforation as well as benefits, limitations, alternatives and imponderables have been reviewed with the patient. Questions have been answered. All parties agreeable.  For patient's constipation, I recommended taking MiraLAX 1 capful daily.  If this is not adequate then increase to 2 capfuls daily.  If this is still not adequate then add on once daily Dulcolax.  Encouraged to drink at least 6 glasses of water  daily.  Recommended he reestablish with cardiology.  Thank you Lauraine Lunger for the kind referral  07/21/2024 8:41 AM   Disclaimer: This note was dictated with voice recognition software. Similar sounding words can inadvertently be transcribed and may not be corrected upon review.

## 2024-07-21 NOTE — Telephone Encounter (Signed)
 LMOVM to call back to schedule TCS with Dr. Cindie, ASA 3, 1st week of November

## 2024-07-21 NOTE — Patient Instructions (Signed)
 We will schedule you for colonoscopy to further evaluate your abdominal pain as well as for colon cancer screening purposes.  It was very nice meeting you today.  Dr. Cindie

## 2024-08-03 NOTE — Telephone Encounter (Signed)
 LMOVM to call back

## 2024-08-05 NOTE — Telephone Encounter (Signed)
 Letter mailed

## 2024-08-12 DIAGNOSIS — Z125 Encounter for screening for malignant neoplasm of prostate: Secondary | ICD-10-CM | POA: Diagnosis not present

## 2024-08-12 DIAGNOSIS — E782 Mixed hyperlipidemia: Secondary | ICD-10-CM | POA: Diagnosis not present

## 2024-08-18 DIAGNOSIS — R809 Proteinuria, unspecified: Secondary | ICD-10-CM | POA: Diagnosis not present

## 2024-08-18 DIAGNOSIS — Z95820 Peripheral vascular angioplasty status with implants and grafts: Secondary | ICD-10-CM | POA: Diagnosis not present

## 2024-08-18 DIAGNOSIS — E782 Mixed hyperlipidemia: Secondary | ICD-10-CM | POA: Diagnosis not present

## 2024-08-18 DIAGNOSIS — D696 Thrombocytopenia, unspecified: Secondary | ICD-10-CM | POA: Diagnosis not present

## 2024-08-18 DIAGNOSIS — Z125 Encounter for screening for malignant neoplasm of prostate: Secondary | ICD-10-CM | POA: Diagnosis not present

## 2024-08-18 DIAGNOSIS — R944 Abnormal results of kidney function studies: Secondary | ICD-10-CM | POA: Diagnosis not present

## 2024-08-18 DIAGNOSIS — R7309 Other abnormal glucose: Secondary | ICD-10-CM | POA: Diagnosis not present

## 2024-08-18 DIAGNOSIS — F17210 Nicotine dependence, cigarettes, uncomplicated: Secondary | ICD-10-CM | POA: Diagnosis not present

## 2024-08-18 DIAGNOSIS — Z0001 Encounter for general adult medical examination with abnormal findings: Secondary | ICD-10-CM | POA: Diagnosis not present

## 2024-08-18 DIAGNOSIS — I10 Essential (primary) hypertension: Secondary | ICD-10-CM | POA: Diagnosis not present

## 2024-08-18 DIAGNOSIS — Z0000 Encounter for general adult medical examination without abnormal findings: Secondary | ICD-10-CM | POA: Diagnosis not present

## 2024-08-19 NOTE — Telephone Encounter (Signed)
 LMOVM to return call  Pt left vm about scheduling procedure.
